# Patient Record
Sex: Male | Born: 1960 | Race: White | Hispanic: No | Marital: Single | Smoking: Never smoker
Health system: Southern US, Academic
[De-identification: ages and names within clinical notes are randomized; demographics above are authoritative.]

## PROBLEM LIST (undated history)

## (undated) DIAGNOSIS — I10 Essential (primary) hypertension: Secondary | ICD-10-CM

## (undated) DIAGNOSIS — F419 Anxiety disorder, unspecified: Secondary | ICD-10-CM

## (undated) DIAGNOSIS — R000 Tachycardia, unspecified: Secondary | ICD-10-CM

## (undated) DIAGNOSIS — R011 Cardiac murmur, unspecified: Secondary | ICD-10-CM

## (undated) DIAGNOSIS — C801 Malignant (primary) neoplasm, unspecified: Secondary | ICD-10-CM

## (undated) DIAGNOSIS — K219 Gastro-esophageal reflux disease without esophagitis: Secondary | ICD-10-CM

## (undated) DIAGNOSIS — R009 Unspecified abnormalities of heart beat: Secondary | ICD-10-CM

## (undated) DIAGNOSIS — F32A Depression, unspecified: Secondary | ICD-10-CM

## (undated) HISTORY — DX: Depression, unspecified: F32.A

## (undated) HISTORY — DX: Anxiety disorder, unspecified: F41.9

## (undated) HISTORY — PX: HX HERNIA REPAIR: SHX51

## (undated) HISTORY — PX: HX GALL BLADDER SURGERY/CHOLE: SHX55

## (undated) HISTORY — PX: HX TONSILLECTOMY: SHX27

## (undated) HISTORY — PX: COLONOSCOPY: SHX174

## (undated) HISTORY — PX: ESOPHAGOGASTRODUODENOSCOPY: SHX1529

## (undated) HISTORY — PX: VASECTOMY: SHX75

## (undated) HISTORY — DX: Gastro-esophageal reflux disease without esophagitis: K21.9

## (undated) HISTORY — DX: Essential (primary) hypertension: I10

## (undated) HISTORY — PX: INGUINAL HERNIA REPAIR: SUR1180

---

## 2017-04-29 ENCOUNTER — Emergency Department (HOSPITAL_BASED_OUTPATIENT_CLINIC_OR_DEPARTMENT_OTHER): Payer: Managed Care, Other (non HMO)

## 2017-04-29 ENCOUNTER — Observation Stay (HOSPITAL_COMMUNITY): Payer: Managed Care, Other (non HMO)

## 2017-04-29 ENCOUNTER — Inpatient Hospital Stay (HOSPITAL_BASED_OUTPATIENT_CLINIC_OR_DEPARTMENT_OTHER)
Admission: EM | Admit: 2017-04-29 | Discharge: 2017-05-01 | DRG: 872 | Disposition: A | Discharge status: Home / Self Care | Payer: Managed Care, Other (non HMO) | Attending: Internal Medicine | Admitting: Internal Medicine

## 2017-04-29 ENCOUNTER — Encounter (HOSPITAL_BASED_OUTPATIENT_CLINIC_OR_DEPARTMENT_OTHER): Payer: Self-pay | Admitting: Emergency Medicine

## 2017-04-29 DIAGNOSIS — R739 Hyperglycemia, unspecified: Secondary | ICD-10-CM | POA: Diagnosis present

## 2017-04-29 DIAGNOSIS — A419 Sepsis, unspecified organism: Secondary | ICD-10-CM | POA: Diagnosis present

## 2017-04-29 DIAGNOSIS — Z8249 Family history of ischemic heart disease and other diseases of the circulatory system: Secondary | ICD-10-CM

## 2017-04-29 DIAGNOSIS — K219 Gastro-esophageal reflux disease without esophagitis: Secondary | ICD-10-CM | POA: Diagnosis present

## 2017-04-29 DIAGNOSIS — R079 Chest pain, unspecified: Secondary | ICD-10-CM | POA: Diagnosis present

## 2017-04-29 DIAGNOSIS — R000 Tachycardia, unspecified: Secondary | ICD-10-CM | POA: Diagnosis not present

## 2017-04-29 DIAGNOSIS — R935 Abnormal findings on diagnostic imaging of other abdominal regions, including retroperitoneum: Secondary | ICD-10-CM | POA: Diagnosis not present

## 2017-04-29 DIAGNOSIS — R509 Fever, unspecified: Secondary | ICD-10-CM

## 2017-04-29 DIAGNOSIS — R748 Abnormal levels of other serum enzymes: Secondary | ICD-10-CM | POA: Diagnosis not present

## 2017-04-29 DIAGNOSIS — K810 Acute cholecystitis: Secondary | ICD-10-CM | POA: Diagnosis present

## 2017-04-29 DIAGNOSIS — I361 Nonrheumatic tricuspid (valve) insufficiency: Secondary | ICD-10-CM | POA: Diagnosis not present

## 2017-04-29 DIAGNOSIS — E876 Hypokalemia: Secondary | ICD-10-CM | POA: Diagnosis present

## 2017-04-29 DIAGNOSIS — R1013 Epigastric pain: Secondary | ICD-10-CM | POA: Diagnosis not present

## 2017-04-29 DIAGNOSIS — R945 Abnormal results of liver function studies: Secondary | ICD-10-CM | POA: Diagnosis not present

## 2017-04-29 DIAGNOSIS — F419 Anxiety disorder, unspecified: Secondary | ICD-10-CM | POA: Diagnosis present

## 2017-04-29 DIAGNOSIS — R7989 Other specified abnormal findings of blood chemistry: Secondary | ICD-10-CM

## 2017-04-29 HISTORY — DX: Anxiety disorder, unspecified: F41.9

## 2017-04-29 LAB — BASIC METABOLIC PANEL
ANION GAP: 6 (ref 5–15)
BUN: 13 mg/dL (ref 6–20)
CALCIUM: 9 mg/dL (ref 8.9–10.3)
CO2: 26 mmol/L (ref 22–32)
Chloride: 105 mmol/L (ref 101–111)
Creatinine, Ser: 0.95 mg/dL (ref 0.61–1.24)
Glucose, Bld: 129 mg/dL — ABNORMAL HIGH (ref 65–99)
Potassium: 3.6 mmol/L (ref 3.5–5.1)
Sodium: 137 mmol/L (ref 135–145)

## 2017-04-29 LAB — CBC
HCT: 41.3 % (ref 39.0–52.0)
HEMOGLOBIN: 13.7 g/dL (ref 13.0–17.0)
MCH: 29 pg (ref 26.0–34.0)
MCHC: 33.2 g/dL (ref 30.0–36.0)
MCV: 87.3 fL (ref 78.0–100.0)
Platelets: 224 10*3/uL (ref 150–400)
RBC: 4.73 MIL/uL (ref 4.22–5.81)
RDW: 12.5 % (ref 11.5–15.5)
WBC: 8 10*3/uL (ref 4.0–10.5)

## 2017-04-29 LAB — TROPONIN I

## 2017-04-29 LAB — D-DIMER, QUANTITATIVE (NOT AT ARMC): D DIMER QUANT: 0.51 ug{FEU}/mL — AB (ref 0.00–0.50)

## 2017-04-29 LAB — LIPASE, BLOOD: LIPASE: 58 U/L — AB (ref 11–51)

## 2017-04-29 MED ORDER — ACETAMINOPHEN 325 MG PO TABS
650.0000 mg | ORAL_TABLET | Freq: Four times a day (QID) | ORAL | Status: DC | PRN
  Administered 2017-04-30 (×2): 650 mg via ORAL
  Filled 2017-04-29 (×2): qty 2

## 2017-04-29 MED ORDER — ACETAMINOPHEN 500 MG PO TABS
1000.0000 mg | ORAL_TABLET | Freq: Once | ORAL | Status: AC
  Administered 2017-04-29: 1000 mg via ORAL
  Filled 2017-04-29: qty 2

## 2017-04-29 MED ORDER — ONDANSETRON HCL 4 MG/2ML IJ SOLN
INTRAMUSCULAR | Status: AC
  Filled 2017-04-29: qty 2

## 2017-04-29 MED ORDER — ONDANSETRON HCL 4 MG/2ML IJ SOLN
4.0000 mg | Freq: Once | INTRAMUSCULAR | Status: AC
  Administered 2017-04-29: 4 mg via INTRAVENOUS

## 2017-04-29 MED ORDER — MORPHINE SULFATE (PF) 4 MG/ML IV SOLN
4.0000 mg | Freq: Once | INTRAVENOUS | Status: AC
  Administered 2017-04-29: 4 mg via INTRAVENOUS
  Filled 2017-04-29: qty 1

## 2017-04-29 MED ORDER — NITROGLYCERIN 0.4 MG SL SUBL
SUBLINGUAL_TABLET | SUBLINGUAL | Status: AC
  Filled 2017-04-29: qty 1

## 2017-04-29 MED ORDER — ASPIRIN 81 MG PO CHEW
324.0000 mg | CHEWABLE_TABLET | Freq: Once | ORAL | Status: AC
  Administered 2017-04-29: 324 mg via ORAL
  Filled 2017-04-29: qty 4

## 2017-04-29 MED ORDER — IOPAMIDOL (ISOVUE-370) INJECTION 76%
INTRAVENOUS | Status: AC
  Administered 2017-04-29: 100 mL
  Filled 2017-04-29: qty 100

## 2017-04-29 MED ORDER — ENOXAPARIN SODIUM 40 MG/0.4ML ~~LOC~~ SOLN
40.0000 mg | SUBCUTANEOUS | Status: DC
  Filled 2017-04-29: qty 0.4

## 2017-04-29 MED ORDER — NITROGLYCERIN IN D5W 200-5 MCG/ML-% IV SOLN
0.0000 ug/min | INTRAVENOUS | Status: DC
  Filled 2017-04-29: qty 250

## 2017-04-29 MED ORDER — MORPHINE SULFATE (PF) 4 MG/ML IV SOLN
4.0000 mg | Freq: Once | INTRAVENOUS | Status: DC
  Filled 2017-04-29: qty 1

## 2017-04-29 MED ORDER — NITROGLYCERIN 0.4 MG SL SUBL
0.4000 mg | SUBLINGUAL_TABLET | SUBLINGUAL | Status: DC | PRN
  Administered 2017-04-29 (×2): 0.4 mg via SUBLINGUAL

## 2017-04-29 MED ORDER — ACETAMINOPHEN 650 MG RE SUPP: 650.0000 mg | Freq: Four times a day (QID) | RECTAL | Status: DC | PRN

## 2017-04-29 MED ORDER — GI COCKTAIL ~~LOC~~
30.0000 mL | Freq: Once | ORAL | Status: AC
  Administered 2017-04-29: 30 mL via ORAL
  Filled 2017-04-29: qty 30

## 2017-04-29 MED ORDER — SODIUM CHLORIDE 0.9 % IV SOLN
INTRAVENOUS | Status: AC
  Administered 2017-04-30: via INTRAVENOUS

## 2017-04-29 NOTE — ED Provider Notes (Signed)
[facility] Provider Note   CSN: [id number] Arrival date & time: 04/29/17  1335     History   Chief Complaint Chief Complaint  Patient presents with  . Chest Pain    HPI Douglas Hester is a 56 y.o. male who presents emergency Department with chief complaint of epigastric abdominal and retrosternal chest pain. He has a history of chronic reflux was for which she takes pantoprazole. Patient states that he has been under significant stress and recently moved into an apartment here in [facility] because of the end of his relationship. He was out to eat with his brother when he started having pain in his epigastrium he states that the pain became more and more intense. He states he felt like he was being hit in the chest with a sledgehammer the pain radiates directly back in between his shoulder blades. He became nauseous but did not vomit he denies diaphoresis or shortness of breath .He Took his pantoprazole, half a bottle of Pepto-Bismol and 4 Tums with no relief in his pain. He called his insurance health triage nurse line who directed him to the emergency department. Upon arrival he had 10 out of 10 severe epigastric and chest pain. Patient was given aspirin, 3 sublingual nitroglycerin, morphine and a GI cocktail with complete resolution of his pain.  HPI  Past Medical History:  Diagnosis Date  . Anxiety     There are no active problems to display for this patient.   Past Surgical History:  Procedure Laterality Date  . HERNIA REPAIR         Home Medications    Prior to Admission medications   Medication Sig Start Date End Date Taking? Authorizing Provider  buPROPion (WELLBUTRIN) 100 MG tablet Take 100 mg by mouth 2 (two) times daily.   Yes [provider]    Family History History reviewed. No pertinent family history.  Social History Social History  Substance Use Topics  . Smoking status: Never Smoker  . Smokeless tobacco: Never Used  . Alcohol  use No     Allergies   Patient has no known allergies.   Review of Systems Review of Systems  Ten systems reviewed and are negative for acute change, except as noted in the HPI.   Physical Exam Updated Vital Signs BP 114/70   Pulse 88   Temp 98.3 F (36.8 C) (Oral)   Resp 19   Ht  (1.753 m)   Wt 94.3 kg (208 lb)   SpO2 98%   BMI 30.72 kg/m   Physical Exam  Constitutional: He is oriented to person, place, and time. He appears well-developed and well-nourished. No distress.  HENT:  Head: Normocephalic and atraumatic.  Eyes: Pupils are equal, round, and reactive to light. Conjunctivae and EOM are normal. No scleral icterus.  Neck: Normal range of motion. Neck supple.  Cardiovascular: Normal rate, regular rhythm and normal heart sounds.   Pulmonary/Chest: Effort normal and breath sounds normal. No respiratory distress. He has no wheezes.  Abdominal: Soft. He exhibits no distension. There is tenderness (epigastrium). There is no guarding.  Musculoskeletal: He exhibits no edema.  Neurological: He is alert and oriented to person, place, and time.  Skin: Skin is warm and dry. He is not diaphoretic.  Psychiatric: His behavior is normal.  Nursing note and vitals reviewed.    ED Treatments / Results  Labs (all labs ordered are listed, but only abnormal results are displayed) Labs Reviewed  BASIC METABOLIC PANEL -  Abnormal; Notable for the following:       Result Value   Glucose, Bld 129 (*)    All other components within normal limits  LIPASE, BLOOD - Abnormal; Notable for the following:    Lipase 58 (*)    All other components within normal limits  CBC  TROPONIN I  TROPONIN I  TROPONIN I    EKG  EKG Interpretation None       Radiology Dg Chest Portable 1 View  Result Date: 04/29/2017 CLINICAL DATA:  Chest pain. EXAM: PORTABLE CHEST 1 VIEW COMPARISON:  None. FINDINGS: The heart size and mediastinal contours are within normal limits. Both lungs are  clear. No pneumothorax or pleural effusion is noted. The visualized skeletal structures are unremarkable. IMPRESSION: No acute cardiopulmonary abnormality seen. Electronically Signed   By: Lupita Raider, M.D.   On: 04/29/2017 15:42    Procedures Procedures (including critical care time)  Medications Ordered in ED Medications  nitroGLYCERIN (NITROSTAT) SL tablet 0.4 mg (0.4 mg Sublingual Given 04/29/17 1503)  nitroGLYCERIN (NITROSTAT) 0.4 MG SL tablet (not administered)  ondansetron (ZOFRAN) injection 4 mg (4 mg Intravenous Given 04/29/17 1413)  aspirin chewable tablet 324 mg (324 mg Oral Given 04/29/17 1448)  gi cocktail (Maalox,Lidocaine,Donnatal) (30 mLs Oral Given 04/29/17 1448)  morphine 4 MG/ML injection 4 mg (4 mg Intravenous Given 04/29/17 1448)     Initial Impression / Assessment and Plan / ED Course  I have reviewed the triage vital signs and the nursing notes.  Pertinent labs & imaging results that were available during my care of the patient were reviewed by me and considered in my medical decision making (see chart for details).     Bedside ultrasound performed by Dr. Donnald Garre without evidence of dissection, or AAA. Patient is improved with treatment. EKG with right lumbar branch block, no evidence of ischemic abnormalities at this time. Given onset of symptoms earlier today feel he will need rule out for chest pain. Initial troponin is normal.   Patient with minimal risk factors for acute coronary syndrome however onset of symptoms and story are clinically significant for ACS. Patient may also have peptic ulcer disease as the cause of his pain. He is pain-free at this time. It has been accepted for observation admission at [facility]. He is stable for transfer. Final Clinical Impressions(s) / ED Diagnoses   Final diagnoses:  None    New Prescriptions New Prescriptions   No medications on file     Arthor Captain, PA-C 04/29/17 1643    Arby Barrette,  MD 05/04/17 0028

## 2017-04-29 NOTE — ED Notes (Signed)
Dr Hardie Lora at bs with abd [place]

## 2017-04-29 NOTE — ED Notes (Signed)
Epigastric pain that radiates to back about after eating a egg mcmuffin and hash browns

## 2017-04-29 NOTE — H&P (Signed)
[facility] H&P   Patient Demographics:    Douglas Hester, is a 56 y.o. male  MRN: [id number]   DOB - Jan 24, 1961  Admit Date - 04/29/2017  Outpatient Primary MD for the patient is Alben Spittle, Ellin Mayhew., MD [facility])  Referring MD/NP/PA:   Arthor Captain  Outpatient Specialists:   Patient coming from: home  Chief Complaint  Patient presents with  . Chest Pain      HPI:    Douglas Hester  is a 56 y.o. male, w Anxiety c/o epigastric, substernal chest pain after breakfast.  With radiation to the back.  Micah Flesher to [facility] and took pepto and antacid and omeprazole and didn't improve and then called [employer] nurse line and went to ED.  In ED, CXR negative. Pt had beside ultrasound=> no dissection .    In ED, given GI cocktail.  Pt does't think the nitroglycerin helped.  Morphine helped.  Pt is currently pain free.      Review of systems:    In addition to the HPI above,   No Headache, No changes with Vision or hearing, No problems swallowing food or Liquids, No Cough or Shortness of Breath, No Abdominal pain, No Nausea or Vommitting, Bowel movements are regular, No Blood in stool or Urine, No dysuria, No new skin rashes or bruises, No new joints pains-aches,  No new weakness, tingling, numbness in any extremity, No recent weight gain or loss, No polyuria, polydypsia or polyphagia, No significant Mental Stressors.  A full 10 point Review of Systems was done, except as stated above, all other Review of Systems were negative.   With Past History of the following :    Past Medical History:  Diagnosis Date  . Anxiety       Past Surgical History:  Procedure Laterality Date  . HERNIA REPAIR        Social History:     Social History  Substance Use Topics  . Smoking status: Never Smoker  . Smokeless tobacco: Never Used  . Alcohol use No     Lives - at  home  Mobility - walks by self   Family History :     Family History  Problem Relation Age of Onset  . Lung cancer Mother   . Lymphoma Father   . Valvular heart disease Sister        Home Medications:   Prior to Admission medications   Medication Sig Start Date End Date Taking? Authorizing Provider  buPROPion (WELLBUTRIN) 100 MG tablet Take 100 mg by mouth 2 (two) times daily.   Yes [provider]     Allergies:    No Known Allergies   Physical Exam:   Vitals  Blood pressure 138/78, pulse (!) 117, temperature (!) 101.6 F (38.7 C), temperature source Oral, resp. rate (!) 24, height  (1.753 m), weight 91.5 kg (201  lb 11.2 oz), SpO2 100 %.   1. General  lying in bed in NAD,   2. Normal affect and insight, Not Suicidal or Homicidal, Awake Alert, Oriented X 3.  3. No F.N deficits, ALL C.Nerves Intact, Strength 5/5 all 4 extremities, Sensation intact all 4 extremities, Plantars down going.  4. Ears and Eyes appear Normal, Conjunctivae clear, PERRLA. Moist Oral Mucosa.  5. Supple Neck, No JVD, No cervical lymphadenopathy appriciated, No Carotid Bruits.  6. Symmetrical Chest wall movement, Good air movement bilaterally, CTAB.  7. Tachy s1, s2, No Gallops, Rubs or Murmurs, No Parasternal Heave.  8. Positive Bowel Sounds, Abdomen Soft, No tenderness, No organomegaly appriciated,No rebound -guarding or rigidity.  9.  No Cyanosis, Normal Skin Turgor, No Skin Rash or Bruise.  10. Good muscle tone,  joints appear normal , no effusions, Normal ROM.  11. No Palpable Lymph Nodes in Neck or Axillae  No splinter, no janeway, no osler   Data Review:    CBC  Recent Labs Lab 04/29/17 1411  WBC 8.0  HGB 13.7  HCT 41.3  PLT 224  MCV 87.3  MCH 29.0  MCHC 33.2  RDW 12.5   ------------------------------------------------------------------------------------------------------------------  Chemistries   Recent Labs Lab 04/29/17 1411  NA 137  K 3.6   CL 105  CO2 26  GLUCOSE 129*  BUN 13  CREATININE 0.95  CALCIUM 9.0   ------------------------------------------------------------------------------------------------------------------ estimated creatinine clearance is 98.2 mL/min (by C-G formula based on SCr of 0.95 mg/dL). ------------------------------------------------------------------------------------------------------------------ No results for input(s): TSH, T4TOTAL, T3FREE, THYROIDAB in the last 72 hours.  Invalid input(s): FREET3  Coagulation profile No results for input(s): INR, PROTIME in the last 168 hours. ------------------------------------------------------------------------------------------------------------------- No results for input(s): DDIMER in the last 72 hours. -------------------------------------------------------------------------------------------------------------------  Cardiac Enzymes  Recent Labs Lab 04/29/17 1411 04/29/17 1750  TROPONINI <0.03 <0.03   ------------------------------------------------------------------------------------------------------------------ No results found for: BNP   ---------------------------------------------------------------------------------------------------------------  Urinalysis No results found for: COLORURINE, APPEARANCEUR, LABSPEC, PHURINE, GLUCOSEU, HGBUR, BILIRUBINUR, KETONESUR, PROTEINUR, UROBILINOGEN, NITRITE, LEUKOCYTESUR  ----------------------------------------------------------------------------------------------------------------   Imaging Results:    Dg Chest Portable 1 View  Result Date: 04/29/2017 CLINICAL DATA:  Chest pain. EXAM: PORTABLE CHEST 1 VIEW COMPARISON:  None. FINDINGS: The heart size and mediastinal contours are within normal limits. Both lungs are clear. No pneumothorax or pleural effusion is noted. The visualized skeletal structures are unremarkable. IMPRESSION: No acute cardiopulmonary abnormality seen. Electronically  Signed   By: Lupita Raider, M.D.   On: 04/29/2017 15:42    nsr at 95, lad, RBBB and LAFB   Assessment & Plan:    Active Problems:   Chest pain   Fever, unspecified   Tachycardia   Hyperglycemia    Cp Tele tropi q6h x3 D dimer If positive the CTA chest r/o PE Cardiac echo Cardiology consulted by email   Fever Unclear source. Blood culture x2,  ua , urine culture Consider further w/up  Tachycardia Check tsh Check cardiac echo  Hyerglycemia fsbs q4h, ISS    DVT Prophylaxis  Lovenox - SCDs  AM Labs Ordered, also please review Full Orders  Family Communication: Admission, patients condition and plan of care including tests being ordered have been discussed with the patient  who indicate understanding and agree with the plan and Code Status.  Code Status FULL CODE  Likely DC to  home  Condition GUARDED    Consults called: cardioloogy by email  Admission status: inpatient  Time spent in minutes : 45   Pearson Grippe M.D on 04/29/2017  at 10:29 PM  Between 7am to 7pm - Pager - [phone]. After 7pm go to [email or web address] - password [facility]  [facility] - Office  [phone]

## 2017-04-29 NOTE — ED Notes (Signed)
Delay in xray, RN starting IV 

## 2017-04-29 NOTE — ED Notes (Signed)
Pt on cardiac monitor and auto VS 

## 2017-04-29 NOTE — ED Notes (Signed)
ED Provider at bedside. 

## 2017-04-29 NOTE — ED Notes (Signed)
Dr Hardie Lora ordered to hold third nitro.

## 2017-04-29 NOTE — ED Triage Notes (Signed)
Patient states that he started to have some mid epigastric pain this am. Reports that it now radiates up into his back

## 2017-04-30 ENCOUNTER — Inpatient Hospital Stay (HOSPITAL_COMMUNITY): Payer: Managed Care, Other (non HMO)

## 2017-04-30 DIAGNOSIS — R1013 Epigastric pain: Secondary | ICD-10-CM

## 2017-04-30 DIAGNOSIS — I361 Nonrheumatic tricuspid (valve) insufficiency: Secondary | ICD-10-CM

## 2017-04-30 LAB — LACTIC ACID, PLASMA
LACTIC ACID, VENOUS: 0.9 mmol/L (ref 0.5–1.9)
Lactic Acid, Venous: 0.7 mmol/L (ref 0.5–1.9)

## 2017-04-30 LAB — COMPREHENSIVE METABOLIC PANEL
ALT: 184 U/L — ABNORMAL HIGH (ref 17–63)
AST: 220 U/L — AB (ref 15–41)
Albumin: 3.6 g/dL (ref 3.5–5.0)
Alkaline Phosphatase: 85 U/L (ref 38–126)
Anion gap: 6 (ref 5–15)
BUN: 8 mg/dL (ref 6–20)
CHLORIDE: 105 mmol/L (ref 101–111)
CO2: 26 mmol/L (ref 22–32)
Calcium: 8.7 mg/dL — ABNORMAL LOW (ref 8.9–10.3)
Creatinine, Ser: 0.92 mg/dL (ref 0.61–1.24)
Glucose, Bld: 119 mg/dL — ABNORMAL HIGH (ref 65–99)
Potassium: 3.6 mmol/L (ref 3.5–5.1)
Sodium: 137 mmol/L (ref 135–145)
Total Bilirubin: 2.9 mg/dL — ABNORMAL HIGH (ref 0.3–1.2)
Total Protein: 6.4 g/dL — ABNORMAL LOW (ref 6.5–8.1)

## 2017-04-30 LAB — ECHOCARDIOGRAM COMPLETE
AOASC: 32 cm
EWDT: 194 ms
FS: 31 % (ref 28–44)
Height: 69 in
IV/PV OW: 0.83
LA ID, A-P, ES: 36 mm
LA diam end sys: 36 mm
LA vol A4C: 39.2 ml
LA vol index: 21.3 mL/m2
LA vol: 45.5 mL
LADIAMINDEX: 1.69 cm/m2
LV PW d: 12 mm — AB (ref 0.6–1.1)
LV TDI E'LATERAL: 8.7
LV TDI E'MEDIAL: 9.68
LVELAT: 8.7 cm/s
LVOT area: 3.46 cm2
LVOTD: 21 mm
Lateral S' vel: 13.2 cm/s
MV Dec: 194
MV pk E vel: 1 m/s
RV TAPSE: 21.4 mm
Weight: 3227.2 oz

## 2017-04-30 LAB — CBC
HEMATOCRIT: 39.6 % (ref 39.0–52.0)
HEMOGLOBIN: 13.1 g/dL (ref 13.0–17.0)
MCH: 28.9 pg (ref 26.0–34.0)
MCHC: 33.1 g/dL (ref 30.0–36.0)
MCV: 87.4 fL (ref 78.0–100.0)
Platelets: 187 10*3/uL (ref 150–400)
RBC: 4.53 MIL/uL (ref 4.22–5.81)
RDW: 12.6 % (ref 11.5–15.5)
WBC: 10.8 10*3/uL — AB (ref 4.0–10.5)

## 2017-04-30 LAB — HIV ANTIBODY (ROUTINE TESTING W REFLEX): HIV SCREEN 4TH GENERATION: NONREACTIVE

## 2017-04-30 LAB — TROPONIN I: Troponin I: <0.03 ng/mL (ref <0.03)

## 2017-04-30 LAB — HEMOGLOBIN A1C
Hgb A1c MFr Bld: 5.2 % (ref 4.8–5.6)
Mean Plasma Glucose: 102.54 mg/dL

## 2017-04-30 LAB — SEDIMENTATION RATE: Sed Rate: 2 mm/hr (ref 0–16)

## 2017-04-30 LAB — TSH: TSH: 0.592 u[IU]/mL (ref 0.350–4.500)

## 2017-04-30 MED ORDER — GADOBENATE DIMEGLUMINE 529 MG/ML IV SOLN
20.0000 mL | Freq: Once | INTRAVENOUS | Status: AC | PRN
  Administered 2017-04-30: 20 mL via INTRAVENOUS

## 2017-04-30 MED ORDER — VANCOMYCIN HCL IN DEXTROSE 1-5 GM/200ML-% IV SOLN
1000.0000 mg | Freq: Three times a day (TID) | INTRAVENOUS | Status: DC
  Administered 2017-04-30: 1000 mg via INTRAVENOUS
  Filled 2017-04-30 (×2): qty 200

## 2017-04-30 MED ORDER — PIPERACILLIN-TAZOBACTAM 3.375 G IVPB
3.3750 g | Freq: Three times a day (TID) | INTRAVENOUS | Status: DC
  Administered 2017-04-30 – 2017-05-01 (×4): 3.375 g via INTRAVENOUS
  Filled 2017-04-30 (×6): qty 50

## 2017-04-30 NOTE — Progress Notes (Signed)
Pharmacy Antibiotic Note  Douglas Hester is a 56 y.o. male admitted on 04/29/2017 with fever.  Pharmacy has been consulted for Vancomycin/Zosyn dosing. WBC WNL. Renal function good. Febrile up to 101.6.   Plan: Vancomycin 1000 mg IV q8h Zosyn 3.375G IV q8h to be infused over 4 hours Trend WBC, temp, renal function  F/U infectious work-up Drug levels as indicated   Height:  (175.3 cm) Weight: 201 lb 11.2 oz (91.5 kg) IBW/kg (Calculated) : 70.7  Temp (24hrs), Avg:100 F (37.8 C), Min:98.3 F (36.8 C), Max:101.6 F (38.7 C)   Recent Labs Lab 04/29/17 1411  WBC 8.0  CREATININE 0.95    Estimated Creatinine Clearance: 98.2 mL/min (by C-G formula based on SCr of 0.95 mg/dL).    No Known Allergies  Abran Duke 04/30/2017 12:52 AM

## 2017-04-30 NOTE — Progress Notes (Signed)
  Echocardiogram 2D Echocardiogram has been performed.  Douglas Hester 04/30/2017, 11:08 AM

## 2017-04-30 NOTE — Consult Note (Signed)
CONSULTATION NOTE   Patient Name: Douglas Hester Date of Encounter: 04/30/2017 Cardiologist: None (NEW)  Chief Complaint   Chest pain  Hospital Problem List   Active Problems:   Chest pain   Fever, unspecified   Tachycardia   Hyperglycemia    HPI   Douglas Hester is a 56 y.o. male who is being seen today for the evaluation of chest pain at the request of Dr. Rockne Menghini. Douglas Hester was admitted overnight for chest pain, fever, tachycardia and hyperglycemia. According to his account, he describes epigastric abdominal discomfort and retrosternal chest pain. He has a history of GERD. He felt like the pain radiated to his back on his shoulder blades. He reported no relief with a home GI cocktail. He also is given aspirin, 3 sublingual nitroglycerin, morphine and a GI cocktail around the same time. Eventually his pain did resolve.EKG shows sinus rhythm with left anterior fascicular block and right bundle branch block at 95. No acute ischemic changes.troponins yesterday and today were all negative.he is noted to havean elevated AST and ALT at 220/184. Total bilirubin is elevated at 2.9 and lipase is elevated at 58. D-dimer was performed a mildly abnormal 0.51 however CT pulmonary angiography demonstrated no evidence for pulmonary embolism or aortic dissection.  PMHx   Past Medical History:  Diagnosis Date  . Anxiety     Past Surgical History:  Procedure Laterality Date  . HERNIA REPAIR      FAMHx   Family History  Problem Relation Age of Onset  . Lung cancer Mother   . Lymphoma Father   . Valvular heart disease Sister     SOCHx    reports that he has never smoked. He has never used smokeless tobacco. He reports that he does not drink alcohol or use drugs.  Outpatient Medications   No current facility-administered medications on file prior to encounter.    No current outpatient prescriptions on file prior to encounter.    Inpatient Medications    Scheduled Meds: .  enoxaparin (LOVENOX) injection  40 mg Subcutaneous Q24H  .  morphine injection  4 mg Intravenous Once    Continuous Infusions: . sodium chloride 75 mL/hr at 04/30/17 0000  . piperacillin-tazobactam (ZOSYN)  IV 3.375 g (04/30/17 0147)  . vancomycin 1,000 mg (04/30/17 0147)    PRN Meds: acetaminophen **OR** acetaminophen, nitroGLYCERIN   ALLERGIES   No Known Allergies  ROS   Pertinent items noted in HPI and remainder of comprehensive ROS otherwise negative.  Vitals   Vitals:   04/29/17 1945 04/29/17 2000 04/29/17 2208 04/30/17 0713  BP: 130/80 136/82 138/78 119/72  Pulse: (!) 118 (!) 108 (!) 117 (!) 104  Resp: 20 (!) 27 (!) 24   Temp:   (!) 101.6 F (38.7 C) 100 F (37.8 C)  TempSrc:   Oral Oral  SpO2: 98% 97% 100% 95%  Weight:   201 lb 11.2 oz (91.5 kg)   Height:   _0  (1.753 m)    No intake or output data in the 24 hours ending 04/30/17 0824 Filed Weights   04/29/17 1339 04/29/17 2208  Weight: 208 lb (94.3 kg) 201 lb 11.2 oz (91.5 kg)    Physical Exam   General appearance: alert and no distress Neck: no carotid bruit, no JVD and thyroid not enlarged, symmetric, no tenderness/mass/nodules Lungs: clear to auscultation bilaterally Heart: regular rate and rhythm, S1, S2 normal, no murmur, click, rub or gallop Abdomen: mild TTP over the midepigastrium Extremities: extremities normal,  atraumatic, no cyanosis or edema Pulses: 2+ and symmetric Skin: Skin color, texture, turgor normal. No rashes or lesions Neurologic: Grossly normal Psych: Pleasant  Labs   Results for orders placed or performed during the hospital encounter of 04/29/17 (from the past 48 hour(s))  Basic metabolic panel     Status: Abnormal   Collection Time: 04/29/17  2:11 PM  Result Value Ref Range   Sodium 137 135 - 145 mmol/L   Potassium 3.6 3.5 - 5.1 mmol/L   Chloride 105 101 - 111 mmol/L   CO2 26 22 - 32 mmol/L   Glucose, Bld 129 (H) 65 - 99 mg/dL   BUN 13 6 - 20 mg/dL   Creatinine, Ser  0.95 0.61 - 1.24 mg/dL   Calcium 9.0 8.9 - 10.3 mg/dL   GFR calc non Af Amer >60 >60 mL/min   GFR calc Af Amer >60 >60 mL/min    Comment: (NOTE) The eGFR has been calculated using the CKD EPI equation. This calculation has not been validated in all clinical situations. eGFR's persistently <60 mL/min signify possible Chronic Kidney Disease.    Anion gap 6 5 - 15  CBC     Status: None   Collection Time: 04/29/17  2:11 PM  Result Value Ref Range   WBC 8.0 4.0 - 10.5 K/uL   RBC 4.73 4.22 - 5.81 MIL/uL   Hemoglobin 13.7 13.0 - 17.0 g/dL   HCT 41.3 39.0 - 52.0 %   MCV 87.3 78.0 - 100.0 fL   MCH 29.0 26.0 - 34.0 pg   MCHC 33.2 30.0 - 36.0 g/dL   RDW 12.5 11.5 - 15.5 %   Platelets 224 150 - 400 K/uL  Troponin I     Status: None   Collection Time: 04/29/17  2:11 PM  Result Value Ref Range   Troponin I <0.03 <0.03 ng/mL  Lipase, blood     Status: Abnormal   Collection Time: 04/29/17  2:11 PM  Result Value Ref Range   Lipase 58 (H) 11 - 51 U/L  Troponin I  (0, 3)     Status: None   Collection Time: 04/29/17  5:50 PM  Result Value Ref Range   Troponin I <0.03 <0.03 ng/mL  D-dimer, quantitative (not at [facility])     Status: Abnormal   Collection Time: 04/29/17 10:43 PM  Result Value Ref Range   D-Dimer, Quant 0.51 (H) 0.00 - 0.50 ug/mL-FEU    Comment: (NOTE) At the manufacturer cut-off of 0.50 ug/mL FEU, this assay has been documented to exclude PE with a sensitivity and negative predictive value of 97 to 99%.  At this time, this assay has not been approved by the FDA to exclude DVT/VTE. Results should be correlated with clinical presentation.   Sedimentation rate     Status: None   Collection Time: 04/29/17 10:43 PM  Result Value Ref Range   Sed Rate 2 0 - 16 mm/hr  Comprehensive metabolic panel     Status: Abnormal   Collection Time: 04/30/17 12:35 AM  Result Value Ref Range   Sodium 137 135 - 145 mmol/L   Potassium 3.6 3.5 - 5.1 mmol/L   Chloride 105 101 - 111 mmol/L   CO2  26 22 - 32 mmol/L   Glucose, Bld 119 (H) 65 - 99 mg/dL   BUN 8 6 - 20 mg/dL   Creatinine, Ser 0.92 0.61 - 1.24 mg/dL   Calcium 8.7 (L) 8.9 - 10.3 mg/dL   Total Protein 6.4 (L)  6.5 - 8.1 g/dL   Albumin 3.6 3.5 - 5.0 g/dL   AST 220 (H) 15 - 41 U/L   ALT 184 (H) 17 - 63 U/L   Alkaline Phosphatase 85 38 - 126 U/L   Total Bilirubin 2.9 (H) 0.3 - 1.2 mg/dL   GFR calc non Af Amer >60 >60 mL/min   GFR calc Af Amer >60 >60 mL/min    Comment: (NOTE) The eGFR has been calculated using the CKD EPI equation. This calculation has not been validated in all clinical situations. eGFR's persistently <60 mL/min signify possible Chronic Kidney Disease.    Anion gap 6 5 - 15  CBC     Status: Abnormal   Collection Time: 04/30/17 12:35 AM  Result Value Ref Range   WBC 10.8 (H) 4.0 - 10.5 K/uL   RBC 4.53 4.22 - 5.81 MIL/uL   Hemoglobin 13.1 13.0 - 17.0 g/dL   HCT 39.6 39.0 - 52.0 %   MCV 87.4 78.0 - 100.0 fL   MCH 28.9 26.0 - 34.0 pg   MCHC 33.1 30.0 - 36.0 g/dL   RDW 12.6 11.5 - 15.5 %   Platelets 187 150 - 400 K/uL  TSH     Status: None   Collection Time: 04/30/17 12:35 AM  Result Value Ref Range   TSH 0.592 0.350 - 4.500 uIU/mL    Comment: Performed by a 3rd Generation assay with a functional sensitivity of <=0.01 uIU/mL.  Lactic acid, plasma     Status: None   Collection Time: 04/30/17 12:36 AM  Result Value Ref Range   Lactic Acid, Venous 0.9 0.5 - 1.9 mmol/L  Lactic acid, plasma     Status: None   Collection Time: 04/30/17  3:37 AM  Result Value Ref Range   Lactic Acid, Venous 0.7 0.5 - 1.9 mmol/L  Troponin I (q 6hr x 3)     Status: None   Collection Time: 04/30/17  3:37 AM  Result Value Ref Range   Troponin I <0.03 <0.03 ng/mL    ECG   NSR with RBBB and LAFB - Personally Reviewed  Telemetry   NSR with bifasicular block in 80's - Personally Reviewed  Radiology   Ct Angio Chest Pe W Or Wo Contrast  Result Date: 04/30/2017 CLINICAL DATA:  Epigastric abdominal pain. History  of hernia repair in chronic reflux. EXAM: CT ANGIOGRAPHY CHEST CT ABDOMEN AND PELVIS WITH CONTRAST TECHNIQUE: Multidetector CT imaging of the chest was performed using the standard protocol during bolus administration of intravenous contrast. Multiplanar CT image reconstructions and MIPs were obtained to evaluate the vascular anatomy. Multidetector CT imaging of the abdomen and pelvis was performed using the standard protocol during bolus administration of intravenous contrast. CONTRAST:  100 mL Isovue 370 COMPARISON:  None. FINDINGS: CTA CHEST FINDINGS Cardiovascular: Satisfactory opacification of the pulmonary arteries to the segmental level. No evidence of pulmonary embolism. Normal heart size. No pericardial effusion. Normal caliber thoracic aorta. No aneurysm or dissection. Great vessel origins are patent. Mediastinum/Nodes: No enlarged mediastinal, hilar, or axillary lymph nodes. Thyroid gland, trachea, and esophagus demonstrate no significant findings. Lungs/Pleura: Mild dependent changes in the lung bases. No airspace disease or consolidation. No pleural effusions. No pneumothorax. Airways are patent. Musculoskeletal: Degenerative changes in the spine. No destructive bone lesions. Review of the MIP images confirms the above findings. CT ABDOMEN and PELVIS FINDINGS Hepatobiliary: Mild diffuse fatty infiltration of the liver. No focal lesions. Gallbladder and bile ducts are unremarkable. Pancreas: Unremarkable. No pancreatic ductal dilatation  or surrounding inflammatory changes. Spleen: Normal in size without focal abnormality. Adrenals/Urinary Tract: No adrenal gland nodules. Kidneys are symmetrical with homogeneous nephrograms. No hydronephrosis or hydroureter. No mass lesions identified. Bladder wall is not thickened and no filling defects are identified. Stomach/Bowel: Stomach is within normal limits. Appendix appears normal. No evidence of bowel wall thickening, distention, or inflammatory changes.  Diverticulum of the third portion of the duodenum. Vascular/Lymphatic: No significant vascular findings are present. No enlarged abdominal or pelvic lymph nodes. Reproductive: Prostate is unremarkable. Other: No abdominal wall hernia or abnormality. No abdominopelvic ascites. Musculoskeletal: No acute or significant osseous findings. Review of the MIP images confirms the above findings. IMPRESSION: 1. No evidence of significant pulmonary embolus. 2. No evidence of active pulmonary disease. 3. Mild diffuse fatty infiltration of the liver. 4. Duodenal diverticulum. 5. No evidence of bowel obstruction or inflammation. Electronically Signed   By: Lucienne Capers M.D.   On: 04/30/2017 00:23   Ct Abdomen Pelvis W Contrast  Result Date: 04/30/2017 CLINICAL DATA:  Epigastric abdominal pain. History of hernia repair in chronic reflux. EXAM: CT ANGIOGRAPHY CHEST CT ABDOMEN AND PELVIS WITH CONTRAST TECHNIQUE: Multidetector CT imaging of the chest was performed using the standard protocol during bolus administration of intravenous contrast. Multiplanar CT image reconstructions and MIPs were obtained to evaluate the vascular anatomy. Multidetector CT imaging of the abdomen and pelvis was performed using the standard protocol during bolus administration of intravenous contrast. CONTRAST:  100 mL Isovue 370 COMPARISON:  None. FINDINGS: CTA CHEST FINDINGS Cardiovascular: Satisfactory opacification of the pulmonary arteries to the segmental level. No evidence of pulmonary embolism. Normal heart size. No pericardial effusion. Normal caliber thoracic aorta. No aneurysm or dissection. Great vessel origins are patent. Mediastinum/Nodes: No enlarged mediastinal, hilar, or axillary lymph nodes. Thyroid gland, trachea, and esophagus demonstrate no significant findings. Lungs/Pleura: Mild dependent changes in the lung bases. No airspace disease or consolidation. No pleural effusions. No pneumothorax. Airways are patent.  Musculoskeletal: Degenerative changes in the spine. No destructive bone lesions. Review of the MIP images confirms the above findings. CT ABDOMEN and PELVIS FINDINGS Hepatobiliary: Mild diffuse fatty infiltration of the liver. No focal lesions. Gallbladder and bile ducts are unremarkable. Pancreas: Unremarkable. No pancreatic ductal dilatation or surrounding inflammatory changes. Spleen: Normal in size without focal abnormality. Adrenals/Urinary Tract: No adrenal gland nodules. Kidneys are symmetrical with homogeneous nephrograms. No hydronephrosis or hydroureter. No mass lesions identified. Bladder wall is not thickened and no filling defects are identified. Stomach/Bowel: Stomach is within normal limits. Appendix appears normal. No evidence of bowel wall thickening, distention, or inflammatory changes. Diverticulum of the third portion of the duodenum. Vascular/Lymphatic: No significant vascular findings are present. No enlarged abdominal or pelvic lymph nodes. Reproductive: Prostate is unremarkable. Other: No abdominal wall hernia or abnormality. No abdominopelvic ascites. Musculoskeletal: No acute or significant osseous findings. Review of the MIP images confirms the above findings. IMPRESSION: 1. No evidence of significant pulmonary embolus. 2. No evidence of active pulmonary disease. 3. Mild diffuse fatty infiltration of the liver. 4. Duodenal diverticulum. 5. No evidence of bowel obstruction or inflammation. Electronically Signed   By: Lucienne Capers M.D.   On: 04/30/2017 00:23   Dg Chest Portable 1 View  Result Date: 04/29/2017 CLINICAL DATA:  Chest pain. EXAM: PORTABLE CHEST 1 VIEW COMPARISON:  None. FINDINGS: The heart size and mediastinal contours are within normal limits. Both lungs are clear. No pneumothorax or pleural effusion is noted. The visualized skeletal structures are unremarkable. IMPRESSION: No acute cardiopulmonary  abnormality seen. Electronically Signed   By: Marijo Conception, M.D.    On: 04/29/2017 15:42    Cardiac Studies   N/A  Impression   1. Active Problems: 2.   Chest pain 3.   Fever, unspecified 4.   Tachycardia 5.   Hyperglycemia 6.   Recommendation   1. Symptoms are atypical for cardiac chest pain. Troponin negative x 3. Liver enzymes are elevated and lipase is as well. Has had low grade fevers. CT abdomen, however, unremarkable. Regardless, suspect a GI etiology - ?acute hepatitis, acalculous cholecystitis, also consider fasting lipid profile to evaluate for hypertriglyceridemia. Family history of CAD in Mom who had a stent and sister who is s/p mitral valve surgery. Could follow-up with me after discharge and consider stress testing.  Thanks for the consult. Will sign-off for now, call with questions.  Time Spent Directly with Patient:  I have spent a total of 45 minutes with the patient reviewing hospital notes, telemetry, EKGs, labs and examining the patient as well as establishing an assessment and plan that was discussed personally with the patient. > 50% of time was spent in direct patient care.  Length of Stay:  LOS: 1 day   Pixie Casino, MD, [place]  Attending Cardiologist  Direct Dial: [phone]  Fax: [phone]  Website: [email or web address] Vazquez 04/30/2017, 8:24 AM

## 2017-04-30 NOTE — Progress Notes (Signed)
Progress Note    Douglas Hester  GNF:[id number] DOB: 1961/02/03  DOA: 04/29/2017 PCP: Elijio Miles., MD    Brief Narrative:   Chief complaint: Follow-up chest pain  Medical records reviewed and are as summarized below:  Douglas Hester is an 56 y.o. male with a PMH of depression who was admitted 04/29/17 for evaluation of substernal chest pain radiating to the back unrelieved by antacids. Initial chest x-ray negative. Troponin negative 3.  Assessment/Plan:   Principal Problem:   Chest pain Chest x-ray negative. No evidence of dissection. Troponin negative 3. D-dimer mildly elevated. Evaluated by cardiologist who recommended outpatient F/U for consideration of a stress test.  Active Problems:   R/O Sepsis, Fever, unspecified/elevated LFTs Temperature up to 101.79F. With elevated LFTs, need to rule out acute cholecystitis. Chest pain may be referred from abdominal process. Placed on empiric vancomycin and Zosyn for sepsis, we'll discontinue vancomycin as MRSA not likely and given elevated LFTs, intra-abdominal source more likely. Lactic acid is not elevated, which is reassuring. Check MRCP. Check FLP/hepatitis serologies.    Tachycardia Likely from sepsis. Treat underlying condition and monitor.    Hyperglycemia Check hemoglobin A1c.   Family Communication/Anticipated D/C date and plan/Code Status   DVT prophylaxis: Lovenox ordered. Code Status: Full Code.  Family Communication: No family at the bedside. Disposition Plan: Home if MRCP negative.   Medical Consultants:    Cardiology.   Anti-Infectives:    None  Subjective:   No current abdominal or chest pain.  Has a pounding headache. Occasional alcohol. No nausea/vomiting today, but had nausea yesterday. No BM in 2 days.  Objective:    Vitals:   04/29/17 1945 04/29/17 2000 04/29/17 2208 04/30/17 0713  BP: 130/80 136/82 138/78 119/72  Pulse: (!) 118 (!) 108 (!) 117 (!) 104  Resp: 20 (!) 27 (!) 24     Temp:   (!) 101.6 F (38.7 C) 100 F (37.8 C)  TempSrc:   Oral Oral  SpO2: 98% 97% 100% 95%  Weight:   91.5 kg (201 lb 11.2 oz)   Height:    (1.753 m)    No intake or output data in the 24 hours ending 04/30/17 0905 Filed Weights   04/29/17 1339 04/29/17 2208  Weight: 94.3 kg (208 lb) 91.5 kg (201 lb 11.2 oz)    Exam: General: No acute distress. Cardiovascular: Heart sounds show a regular rate, and rhythm. No gallops or rubs. No murmurs. No JVD. Lungs: Clear to auscultation bilaterally with good air movement. No rales, rhonchi or wheezes. Abdomen: Soft, nontender, nondistended with normal active bowel sounds. No masses. No hepatosplenomegaly. Neurological: Alert and oriented 3. Moves all extremities 4 with equal strength. Cranial nerves II through XII grossly intact. Skin: Warm and dry. No rashes or lesions. Extremities: No clubbing or cyanosis. No edema. Pedal pulses 2+. Psychiatric: Mood and affect are normal. Insight and judgment are good.   Data Reviewed:   I have personally reviewed following labs and imaging studies:  Labs: Labs reviewed 04/30/17 and show the following:   Basic Metabolic Panel:  Recent Labs Lab 04/29/17 1411 04/30/17 0035  NA 137 137  K 3.6 3.6  CL 105 105  CO2 26 26  GLUCOSE 129* 119*  BUN 13 8  CREATININE 0.95 0.92  CALCIUM 9.0 8.7*   GFR Estimated Creatinine Clearance: 101.4 mL/min (by C-G formula based on SCr of 0.92 mg/dL). Liver Function Tests:  Recent Labs Lab 04/30/17 0035  AST 220*  ALT  184*  ALKPHOS 85  BILITOT 2.9*  PROT 6.4*  ALBUMIN 3.6    Recent Labs Lab 04/29/17 1411  LIPASE 58*   CBC:  Recent Labs Lab 04/29/17 1411 04/30/17 0035  WBC 8.0 10.8*  HGB 13.7 13.1  HCT 41.3 39.6  MCV 87.3 87.4  PLT 224 187   Cardiac Enzymes:  Recent Labs Lab 04/29/17 1411 04/29/17 1750 04/30/17 0337  TROPONINI <0.03 <0.03 <0.03   D-Dimer:  Recent Labs  04/29/17 2243  DDIMER 0.51*   Thyroid function  studies:  Recent Labs  04/30/17 0035  TSH 0.592   Sepsis Labs:  Recent Labs Lab 04/29/17 1411 04/30/17 0035 04/30/17 0036 04/30/17 0337  WBC 8.0 10.8*  --   --   LATICACIDVEN  --   --  0.9 0.7    Microbiology No results found for this or any previous visit (from the past 240 hour(s)).  Procedures and diagnostic studies:  04/30/17: Ct Angio Chest Pe W Or Wo Contrast: No PE.   04/30/17: Ct Abdomen Pelvis W Contrast: Fatty liver. Gallbladder/ducts unremarkable.  04/30/17: Dg Chest Portable 1 View: My independent review of the image shows: Normal chest.   04/30/17: RUQ [place]: Mildly thickened gallbladder wall, no gallstones. Negative Murphy sign.  Medications:   . enoxaparin (LOVENOX) injection  40 mg Subcutaneous Q24H  .  morphine injection  4 mg Intravenous Once   Continuous Infusions: . sodium chloride 75 mL/hr at 04/30/17 0000  . piperacillin-tazobactam (ZOSYN)  IV 3.375 g (04/30/17 0147)  . vancomycin 1,000 mg (04/30/17 0147)     LOS: 1 day   RAMA,CHRISTINA  [facility] Pager [phone]. If unable to reach me by pager, please call my cell phone at [phone].  *Please refer to [email or web address], password [email or web address] to get updated schedule on who will round on this patient, as hospitalists switch teams weekly. If 7PM-7AM, please contact night-coverage at [email or web address], password [email or web address] for any overnight needs.  04/30/2017, 9:05 AM

## 2017-05-01 ENCOUNTER — Encounter (HOSPITAL_COMMUNITY): Payer: Self-pay | Admitting: Physician Assistant

## 2017-05-01 DIAGNOSIS — E876 Hypokalemia: Secondary | ICD-10-CM | POA: Diagnosis present

## 2017-05-01 DIAGNOSIS — R079 Chest pain, unspecified: Secondary | ICD-10-CM

## 2017-05-01 DIAGNOSIS — R7989 Other specified abnormal findings of blood chemistry: Secondary | ICD-10-CM | POA: Diagnosis present

## 2017-05-01 DIAGNOSIS — R748 Abnormal levels of other serum enzymes: Secondary | ICD-10-CM

## 2017-05-01 DIAGNOSIS — R945 Abnormal results of liver function studies: Secondary | ICD-10-CM

## 2017-05-01 DIAGNOSIS — R935 Abnormal findings on diagnostic imaging of other abdominal regions, including retroperitoneum: Secondary | ICD-10-CM

## 2017-05-01 LAB — HEPATITIS PANEL, ACUTE
HEP B C IGM: NEGATIVE
HEP B S AG: NEGATIVE
Hep A IgM: NEGATIVE

## 2017-05-01 LAB — LIPID PANEL
Cholesterol: 137 mg/dL (ref 0–200)
HDL: 48 mg/dL (ref >40)
LDL CALC: 75 mg/dL (ref 0–99)
Total CHOL/HDL Ratio: 2.9 RATIO
Triglycerides: 72 mg/dL (ref <150)
VLDL: 14 mg/dL (ref 0–40)

## 2017-05-01 LAB — COMPREHENSIVE METABOLIC PANEL
ALT: 214 U/L — AB (ref 17–63)
AST: 122 U/L — AB (ref 15–41)
Albumin: 3.5 g/dL (ref 3.5–5.0)
Alkaline Phosphatase: 109 U/L (ref 38–126)
Anion gap: 6 (ref 5–15)
BUN: 5 mg/dL — ABNORMAL LOW (ref 6–20)
CHLORIDE: 107 mmol/L (ref 101–111)
CO2: 25 mmol/L (ref 22–32)
CREATININE: 0.94 mg/dL (ref 0.61–1.24)
Calcium: 9 mg/dL (ref 8.9–10.3)
GFR calc non Af Amer: >60 mL/min (ref >60)
Glucose, Bld: 108 mg/dL — ABNORMAL HIGH (ref 65–99)
Potassium: 3.4 mmol/L — ABNORMAL LOW (ref 3.5–5.1)
SODIUM: 138 mmol/L (ref 135–145)
Total Bilirubin: 3.8 mg/dL — ABNORMAL HIGH (ref 0.3–1.2)
Total Protein: 6.5 g/dL (ref 6.5–8.1)

## 2017-05-01 MED ORDER — METRONIDAZOLE 500 MG PO TABS: 500.0000 mg | ORAL_TABLET | Freq: Three times a day (TID) | ORAL | 0 refills | Status: DC

## 2017-05-01 MED ORDER — CIPROFLOXACIN HCL 500 MG PO TABS: 500.0000 mg | ORAL_TABLET | Freq: Two times a day (BID) | ORAL | 0 refills | Status: AC

## 2017-05-01 MED ORDER — CIPROFLOXACIN HCL 500 MG PO TABS: 500.0000 mg | ORAL_TABLET | Freq: Two times a day (BID) | ORAL | 0 refills | Status: DC

## 2017-05-01 MED ORDER — DIPHENHYDRAMINE HCL 50 MG/ML IJ SOLN
25.0000 mg | Freq: Once | INTRAMUSCULAR | Status: AC
  Administered 2017-05-01: 25 mg via INTRAVENOUS
  Filled 2017-05-01: qty 1

## 2017-05-01 MED ORDER — POTASSIUM CHLORIDE CRYS ER 20 MEQ PO TBCR
40.0000 meq | EXTENDED_RELEASE_TABLET | Freq: Once | ORAL | Status: AC
  Administered 2017-05-01: 40 meq via ORAL

## 2017-05-01 MED ORDER — METRONIDAZOLE 500 MG PO TABS: 500.0000 mg | ORAL_TABLET | Freq: Three times a day (TID) | ORAL | 0 refills | Status: AC

## 2017-05-01 NOTE — Progress Notes (Signed)
Pts Zosyn completed at this time. Pt stated that he has a itchy throat after the last Zosyn dose and his hands and feet itched.  Pt states now that he feels nauseous and his hands and feet itch.  He believes it is from the Zosyn.  Pt given Benadryl. Pt requested a Gingerale for his stomach. Physician notified.

## 2017-05-01 NOTE — Progress Notes (Signed)
Discharge instructions (including medications) discussed with and copy provided to patient/caregiver 

## 2017-05-01 NOTE — Progress Notes (Signed)
Progress note given to patient for work. Sent by MD.  Hillery Hunter RN

## 2017-05-01 NOTE — Consult Note (Signed)
[facility] Surgery Consult Note  Douglas Hester August 29, 1960  [id number].    Requesting MD: Jacquelynn Cree Chief Complaint/Reason for Consult: Elevated LFTs  HPI:  Douglas Hester is a 56yo male PMH GERD who presented to [facility] 9/30 with worsening epigastric pain after eating breakfast at [facility]. States that the pain gradually got worse during the day and eventually radiated into his back. It was associated with nausea. Takes daily protonix for reflux but states that this pain was different. He tried Tums and Pepto Bismal but this did not help therefore he went to the ED. Cardiac workup was negative. Hew was found to have a temp 101.6, elevated LFTs (AST 220, ALT 184, bilirubin 2.9), slightly elevated lipase 58. Hepatitis panel negative. CT abdomen/pelvis negative, RUQ u/s showed mild gallbladder wall thickening but no gallbladder distension and no gallstones, and MRCP with no acute findings. Started on empiric vancomycin and zosyn. In the ED he also received morphine, nitroglycerin, aspirin, and GI cocktail. Patient states that after 1 dose of morphine his pain resolved and has not returned. He denies any current n/v. He is tolerating a regular diet.   PMH significant for GERD, PUD with last upper endoscopy in the 1990's Abdominal surgical history: Left inguinal hernia repair Anticoagulants: none Nonsmoker Drinks 1 beer every other weekend Takes aspirin occasionally for headaches Employment: truck driver  ROS: Review of Systems  Constitutional: Positive for fever.  HENT: Negative.   Eyes: Negative.   Respiratory: Negative.   Cardiovascular: Negative.   Gastrointestinal: Positive for abdominal pain, constipation, heartburn and nausea. Negative for blood in stool, diarrhea, melena and vomiting.  Genitourinary: Negative.   Musculoskeletal: Negative.   Skin: Negative.   Neurological: Negative.   All systems reviewed and otherwise negative except for as  above  Family History  Problem Relation Age of Onset  . Lung cancer Mother   . Lymphoma Father   . Valvular heart disease Sister     Past Medical History:  Diagnosis Date  . Anxiety     Past Surgical History:  Procedure Laterality Date  . COLONOSCOPY  ~ 2014   done by GI in [facility]. Polyps were removed. Patient not aware what type of polyps these were  . ESOPHAGOGASTRODUODENOSCOPY  early 1990s   MD found ulcer  . INGUINAL HERNIA REPAIR Left ~ 2010   with Mesh.   Marland Kitchen VASECTOMY      Social History:  reports that he has never smoked. He has never used smokeless tobacco. He reports that he does not drink alcohol or use drugs.  Allergies: No Known Allergies  Medications Prior to Admission  Medication Sig Dispense Refill  . buPROPion (WELLBUTRIN XL) 300 MG 24 hr tablet Take 300 mg by mouth daily.    . cycloSPORINE (RESTASIS) 0.05 % ophthalmic emulsion Place 2 drops into both eyes 2 (two) times daily.    Marland Kitchen lamoTRIgine (LAMICTAL) 100 MG tablet Take 100 mg by mouth daily.    . pantoprazole (PROTONIX) 40 MG tablet Take 40 mg by mouth daily.      Prior to Admission medications   Medication Sig Start Date End Date Taking? Authorizing Provider  buPROPion (WELLBUTRIN XL) 300 MG 24 hr tablet Take 300 mg by mouth daily. 02/19/17  Yes [provider]  cycloSPORINE (RESTASIS) 0.05 % ophthalmic emulsion Place 2 drops into both eyes 2 (two) times daily. 10/13/15  Yes [provider]  lamoTRIgine (LAMICTAL) 100 MG tablet Take 100 mg by mouth daily. 02/19/17 02/19/18 Yes  [provider]  pantoprazole (PROTONIX) 40 MG tablet Take 40 mg by mouth daily. 02/19/17 02/19/18 Yes [provider]    Blood pressure 106/69, pulse 91, temperature 98.4 F (36.9 C), temperature source Oral, resp. rate 16, height 5' 9"  (1.753 m), weight 201 lb 11.2 oz (91.5 kg), SpO2 98 %. Physical Exam: General: pleasant, WD/WN white male who is laying in bed in NAD HEENT: head is  normocephalic, atraumatic.  Sclera are noninjected.  Pupils equal and round.  Ears and nose without any masses or lesions.  Mouth is pink and moist. Dentition fair Heart: regular, rate, and rhythm.  No obvious murmurs, gallops, or rubs noted.  Palpable pedal pulses bilaterally Lungs: CTAB, no wheezes, rhonchi, or rales noted.  Respiratory effort nonlabored Abd: soft, NT/ND, +BS, no masses, hernias, or organomegaly MS: all 4 extremities are symmetrical with no cyanosis, clubbing, or edema. Skin: warm and dry with no masses, lesions, or rashes Psych: A&Ox3 with an appropriate affect. Neuro: cranial nerves grossly intact, extremity CSM intact bilaterally, normal speech  Results for orders placed or performed during the hospital encounter of 04/29/17 (from the past 48 hour(s))  Basic metabolic panel     Status: Abnormal   Collection Time: 04/29/17  2:11 PM  Result Value Ref Range   Sodium 137 135 - 145 mmol/L   Potassium 3.6 3.5 - 5.1 mmol/L   Chloride 105 101 - 111 mmol/L   CO2 26 22 - 32 mmol/L   Glucose, Bld 129 (H) 65 - 99 mg/dL   BUN 13 6 - 20 mg/dL   Creatinine, Ser 0.95 0.61 - 1.24 mg/dL   Calcium 9.0 8.9 - 10.3 mg/dL   GFR calc non Af Amer >60 >60 mL/min   GFR calc Af Amer >60 >60 mL/min    Comment: (NOTE) The eGFR has been calculated using the CKD EPI equation. This calculation has not been validated in all clinical situations. eGFR's persistently <60 mL/min signify possible Chronic Kidney Disease.    Anion gap 6 5 - 15  CBC     Status: None   Collection Time: 04/29/17  2:11 PM  Result Value Ref Range   WBC 8.0 4.0 - 10.5 K/uL   RBC 4.73 4.22 - 5.81 MIL/uL   Hemoglobin 13.7 13.0 - 17.0 g/dL   HCT 41.3 39.0 - 52.0 %   MCV 87.3 78.0 - 100.0 fL   MCH 29.0 26.0 - 34.0 pg   MCHC 33.2 30.0 - 36.0 g/dL   RDW 12.5 11.5 - 15.5 %   Platelets 224 150 - 400 K/uL  Troponin I     Status: None   Collection Time: 04/29/17  2:11 PM  Result Value Ref Range   Troponin I <0.03 <0.03  ng/mL  Lipase, blood     Status: Abnormal   Collection Time: 04/29/17  2:11 PM  Result Value Ref Range   Lipase 58 (H) 11 - 51 U/L  Troponin I  (0, 3)     Status: None   Collection Time: 04/29/17  5:50 PM  Result Value Ref Range   Troponin I <0.03 <0.03 ng/mL  D-dimer, quantitative (not at [facility])     Status: Abnormal   Collection Time: 04/29/17 10:43 PM  Result Value Ref Range   D-Dimer, Quant 0.51 (H) 0.00 - 0.50 ug/mL-FEU    Comment: (NOTE) At the manufacturer cut-off of 0.50 ug/mL FEU, this assay has been documented to exclude PE with a sensitivity and negative predictive value of 97 to  99%.  At this time, this assay has not been approved by the FDA to exclude DVT/VTE. Results should be correlated with clinical presentation.   HIV antibody (Routine Testing)     Status: None   Collection Time: 04/29/17 10:43 PM  Result Value Ref Range   HIV Screen 4th Generation wRfx Non Reactive Non Reactive    Comment: (NOTE) Performed At: [facility] [place], [place] [id number] Lindon Romp MD [id number]   Sedimentation rate     Status: None   Collection Time: 04/29/17 10:43 PM  Result Value Ref Range   Sed Rate 2 0 - 16 mm/hr  Comprehensive metabolic panel     Status: Abnormal   Collection Time: 04/30/17 12:35 AM  Result Value Ref Range   Sodium 137 135 - 145 mmol/L   Potassium 3.6 3.5 - 5.1 mmol/L   Chloride 105 101 - 111 mmol/L   CO2 26 22 - 32 mmol/L   Glucose, Bld 119 (H) 65 - 99 mg/dL   BUN 8 6 - 20 mg/dL   Creatinine, Ser 0.92 0.61 - 1.24 mg/dL   Calcium 8.7 (L) 8.9 - 10.3 mg/dL   Total Protein 6.4 (L) 6.5 - 8.1 g/dL   Albumin 3.6 3.5 - 5.0 g/dL   AST 220 (H) 15 - 41 U/L   ALT 184 (H) 17 - 63 U/L   Alkaline Phosphatase 85 38 - 126 U/L   Total Bilirubin 2.9 (H) 0.3 - 1.2 mg/dL   GFR calc non Af Amer >60 >60 mL/min   GFR calc Af Amer >60 >60 mL/min    Comment: (NOTE) The eGFR has been calculated using the CKD EPI equation. This calculation  has not been validated in all clinical situations. eGFR's persistently <60 mL/min signify possible Chronic Kidney Disease.    Anion gap 6 5 - 15  CBC     Status: Abnormal   Collection Time: 04/30/17 12:35 AM  Result Value Ref Range   WBC 10.8 (H) 4.0 - 10.5 K/uL   RBC 4.53 4.22 - 5.81 MIL/uL   Hemoglobin 13.1 13.0 - 17.0 g/dL   HCT 39.6 39.0 - 52.0 %   MCV 87.4 78.0 - 100.0 fL   MCH 28.9 26.0 - 34.0 pg   MCHC 33.1 30.0 - 36.0 g/dL   RDW 12.6 11.5 - 15.5 %   Platelets 187 150 - 400 K/uL  TSH     Status: None   Collection Time: 04/30/17 12:35 AM  Result Value Ref Range   TSH 0.592 0.350 - 4.500 uIU/mL    Comment: Performed by a 3rd Generation assay with a functional sensitivity of <=0.01 uIU/mL.  Lactic acid, plasma     Status: None   Collection Time: 04/30/17 12:36 AM  Result Value Ref Range   Lactic Acid, Venous 0.9 0.5 - 1.9 mmol/L  Lactic acid, plasma     Status: None   Collection Time: 04/30/17  3:37 AM  Result Value Ref Range   Lactic Acid, Venous 0.7 0.5 - 1.9 mmol/L  Troponin I (q 6hr x 3)     Status: None   Collection Time: 04/30/17  3:37 AM  Result Value Ref Range   Troponin I <0.03 <0.03 ng/mL  Troponin I (q 6hr x 3)     Status: None   Collection Time: 04/30/17  8:45 AM  Result Value Ref Range   Troponin I <0.03 <0.03 ng/mL  Hemoglobin A1c     Status: None   Collection  Time: 04/30/17  9:24 AM  Result Value Ref Range   Hgb A1c MFr Bld 5.2 4.8 - 5.6 %    Comment: (NOTE) Pre diabetes:          5.7%-6.4% Diabetes:              >6.4% Glycemic control for   <7.0% adults with diabetes    Mean Plasma Glucose 102.54 mg/dL  Troponin I (q 6hr x 3)     Status: None   Collection Time: 04/30/17  3:38 PM  Result Value Ref Range   Troponin I <0.03 <0.03 ng/mL  Hepatitis panel, acute     Status: None   Collection Time: 04/30/17  3:38 PM  Result Value Ref Range   Hepatitis B Surface Ag Negative Negative   HCV Ab <0.1 0.0 - 0.9 s/co ratio    Comment: (NOTE)                                   Negative:     < 0.8                             Indeterminate: 0.8 - 0.9                                  Positive:     > 0.9 The CDC recommends that a positive HCV antibody result be followed up with a HCV Nucleic Acid Amplification test (388828). Performed At: [facility] [place], [place] [id number] Lindon Romp MD [id number]    Hep A IgM Negative Negative   Hep B C IgM Negative Negative  Lipid panel     Status: None   Collection Time: 05/01/17  4:26 AM  Result Value Ref Range   Cholesterol 137 0 - 200 mg/dL   Triglycerides 72 <150 mg/dL   HDL 48 >40 mg/dL   Total CHOL/HDL Ratio 2.9 RATIO   VLDL 14 0 - 40 mg/dL   LDL Cholesterol 75 0 - 99 mg/dL    Comment:        Total Cholesterol/HDL:CHD Risk Coronary Heart Disease Risk Table                     Men   Women  1/2 Average Risk   3.4   3.3  Average Risk       5.0   4.4  2 X Average Risk   9.6   7.1  3 X Average Risk  23.4   11.0        Use the calculated Patient Ratio above and the CHD Risk Table to determine the patient's CHD Risk.        ATP III CLASSIFICATION (LDL):  <100     mg/dL   Optimal  100-129  mg/dL   Near or Above                    Optimal  130-159  mg/dL   Borderline  160-189  mg/dL   High  >190     mg/dL   Very High   Comprehensive metabolic panel     Status: Abnormal   Collection Time: 05/01/17  4:26 AM  Result Value Ref Range   Sodium 138 135 - 145 mmol/L  Potassium 3.4 (L) 3.5 - 5.1 mmol/L   Chloride 107 101 - 111 mmol/L   CO2 25 22 - 32 mmol/L   Glucose, Bld 108 (H) 65 - 99 mg/dL   BUN 5 (L) 6 - 20 mg/dL   Creatinine, Ser 0.94 0.61 - 1.24 mg/dL   Calcium 9.0 8.9 - 10.3 mg/dL   Total Protein 6.5 6.5 - 8.1 g/dL   Albumin 3.5 3.5 - 5.0 g/dL   AST 122 (H) 15 - 41 U/L   ALT 214 (H) 17 - 63 U/L   Alkaline Phosphatase 109 38 - 126 U/L   Total Bilirubin 3.8 (H) 0.3 - 1.2 mg/dL   GFR calc non Af Amer >60 >60 mL/min   GFR calc Af Amer >60 >60  mL/min    Comment: (NOTE) The eGFR has been calculated using the CKD EPI equation. This calculation has not been validated in all clinical situations. eGFR's persistently <60 mL/min signify possible Chronic Kidney Disease.    Anion gap 6 5 - 15   Ct Angio Chest Pe W Or Wo Contrast  Result Date: 04/30/2017 CLINICAL DATA:  Epigastric abdominal pain. History of hernia repair in chronic reflux. EXAM: CT ANGIOGRAPHY CHEST CT ABDOMEN AND PELVIS WITH CONTRAST TECHNIQUE: Multidetector CT imaging of the chest was performed using the standard protocol during bolus administration of intravenous contrast. Multiplanar CT image reconstructions and MIPs were obtained to evaluate the vascular anatomy. Multidetector CT imaging of the abdomen and pelvis was performed using the standard protocol during bolus administration of intravenous contrast. CONTRAST:  100 mL Isovue 370 COMPARISON:  None. FINDINGS: CTA CHEST FINDINGS Cardiovascular: Satisfactory opacification of the pulmonary arteries to the segmental level. No evidence of pulmonary embolism. Normal heart size. No pericardial effusion. Normal caliber thoracic aorta. No aneurysm or dissection. Great vessel origins are patent. Mediastinum/Nodes: No enlarged mediastinal, hilar, or axillary lymph nodes. Thyroid gland, trachea, and esophagus demonstrate no significant findings. Lungs/Pleura: Mild dependent changes in the lung bases. No airspace disease or consolidation. No pleural effusions. No pneumothorax. Airways are patent. Musculoskeletal: Degenerative changes in the spine. No destructive bone lesions. Review of the MIP images confirms the above findings. CT ABDOMEN and PELVIS FINDINGS Hepatobiliary: Mild diffuse fatty infiltration of the liver. No focal lesions. Gallbladder and bile ducts are unremarkable. Pancreas: Unremarkable. No pancreatic ductal dilatation or surrounding inflammatory changes. Spleen: Normal in size without focal abnormality. Adrenals/Urinary  Tract: No adrenal gland nodules. Kidneys are symmetrical with homogeneous nephrograms. No hydronephrosis or hydroureter. No mass lesions identified. Bladder wall is not thickened and no filling defects are identified. Stomach/Bowel: Stomach is within normal limits. Appendix appears normal. No evidence of bowel wall thickening, distention, or inflammatory changes. Diverticulum of the third portion of the duodenum. Vascular/Lymphatic: No significant vascular findings are present. No enlarged abdominal or pelvic lymph nodes. Reproductive: Prostate is unremarkable. Other: No abdominal wall hernia or abnormality. No abdominopelvic ascites. Musculoskeletal: No acute or significant osseous findings. Review of the MIP images confirms the above findings. IMPRESSION: 1. No evidence of significant pulmonary embolus. 2. No evidence of active pulmonary disease. 3. Mild diffuse fatty infiltration of the liver. 4. Duodenal diverticulum. 5. No evidence of bowel obstruction or inflammation. Electronically Signed   By: Lucienne Capers M.D.   On: 04/30/2017 00:23   Ct Abdomen Pelvis W Contrast  Result Date: 04/30/2017 CLINICAL DATA:  Epigastric abdominal pain. History of hernia repair in chronic reflux. EXAM: CT ANGIOGRAPHY CHEST CT ABDOMEN AND PELVIS WITH CONTRAST TECHNIQUE: Multidetector CT imaging of  the chest was performed using the standard protocol during bolus administration of intravenous contrast. Multiplanar CT image reconstructions and MIPs were obtained to evaluate the vascular anatomy. Multidetector CT imaging of the abdomen and pelvis was performed using the standard protocol during bolus administration of intravenous contrast. CONTRAST:  100 mL Isovue 370 COMPARISON:  None. FINDINGS: CTA CHEST FINDINGS Cardiovascular: Satisfactory opacification of the pulmonary arteries to the segmental level. No evidence of pulmonary embolism. Normal heart size. No pericardial effusion. Normal caliber thoracic aorta. No aneurysm  or dissection. Great vessel origins are patent. Mediastinum/Nodes: No enlarged mediastinal, hilar, or axillary lymph nodes. Thyroid gland, trachea, and esophagus demonstrate no significant findings. Lungs/Pleura: Mild dependent changes in the lung bases. No airspace disease or consolidation. No pleural effusions. No pneumothorax. Airways are patent. Musculoskeletal: Degenerative changes in the spine. No destructive bone lesions. Review of the MIP images confirms the above findings. CT ABDOMEN and PELVIS FINDINGS Hepatobiliary: Mild diffuse fatty infiltration of the liver. No focal lesions. Gallbladder and bile ducts are unremarkable. Pancreas: Unremarkable. No pancreatic ductal dilatation or surrounding inflammatory changes. Spleen: Normal in size without focal abnormality. Adrenals/Urinary Tract: No adrenal gland nodules. Kidneys are symmetrical with homogeneous nephrograms. No hydronephrosis or hydroureter. No mass lesions identified. Bladder wall is not thickened and no filling defects are identified. Stomach/Bowel: Stomach is within normal limits. Appendix appears normal. No evidence of bowel wall thickening, distention, or inflammatory changes. Diverticulum of the third portion of the duodenum. Vascular/Lymphatic: No significant vascular findings are present. No enlarged abdominal or pelvic lymph nodes. Reproductive: Prostate is unremarkable. Other: No abdominal wall hernia or abnormality. No abdominopelvic ascites. Musculoskeletal: No acute or significant osseous findings. Review of the MIP images confirms the above findings. IMPRESSION: 1. No evidence of significant pulmonary embolus. 2. No evidence of active pulmonary disease. 3. Mild diffuse fatty infiltration of the liver. 4. Duodenal diverticulum. 5. No evidence of bowel obstruction or inflammation. Electronically Signed   By: Lucienne Capers M.D.   On: 04/30/2017 00:23   Mr 3d Recon At Scanner  Result Date: 05/01/2017 CLINICAL DATA:  Substernal  chest pain after breakfast. Epigastric pain. EXAM: MRI ABDOMEN WITHOUT AND WITH CONTRAST (INCLUDING MRCP) TECHNIQUE: Multiplanar multisequence MR imaging of the abdomen was performed both before and after the administration of intravenous contrast. Heavily T2-weighted images of the biliary and pancreatic ducts were obtained, and three-dimensional MRCP images were rendered by post processing. CONTRAST:  67m MULTIHANCE GADOBENATE DIMEGLUMINE 529 MG/ML IV SOLN COMPARISON:  CT abdomen pelvis 04/29/2017 FINDINGS: Lower chest:  Lung bases are clear. Hepatobiliary: No focal hepatic lesion. No biliary duct dilatation. Gallbladder is normal. No gallstones evident. The common bile duct normal caliber. Mild loss of signal intensity on the opposed phase imaging consistent mild hepatic steatosis. Postcontrast imaging demonstrates no enhancing hepatic lesion. Pancreas: Normal pancreatic parenchymal intensity. No ductal dilatation or inflammation. Spleen: Normal spleen. Adrenals/urinary tract: Adrenal glands and kidneys are normal. Stomach/Bowel: Stomach, duodenum and limited view of the bowel is unremarkable. Vascular/Lymphatic: Abdominal aortic normal caliber. No retroperitoneal periportal lymphadenopathy. Musculoskeletal: No aggressive osseous lesion IMPRESSION: 1. No explanation for abdominal pain. 2. Mild hepatic steatosis. 3. Normal biliary tree and gallbladder. 4. No pancreatic abnormality. Electronically Signed   By: SSuzy BouchardM.D.   On: 05/01/2017 07:39   Dg Chest Portable 1 View  Result Date: 04/29/2017 CLINICAL DATA:  Chest pain. EXAM: PORTABLE CHEST 1 VIEW COMPARISON:  None. FINDINGS: The heart size and mediastinal contours are within normal limits. Both lungs are clear. No  pneumothorax or pleural effusion is noted. The visualized skeletal structures are unremarkable. IMPRESSION: No acute cardiopulmonary abnormality seen. Electronically Signed   By: Marijo Conception, M.D.   On: 04/29/2017 15:42   Mr  Abdomen Mrcp Moise Boring Contast  Result Date: 05/01/2017 CLINICAL DATA:  Substernal chest pain after breakfast. Epigastric pain. EXAM: MRI ABDOMEN WITHOUT AND WITH CONTRAST (INCLUDING MRCP) TECHNIQUE: Multiplanar multisequence MR imaging of the abdomen was performed both before and after the administration of intravenous contrast. Heavily T2-weighted images of the biliary and pancreatic ducts were obtained, and three-dimensional MRCP images were rendered by post processing. CONTRAST:  38m MULTIHANCE GADOBENATE DIMEGLUMINE 529 MG/ML IV SOLN COMPARISON:  CT abdomen pelvis 04/29/2017 FINDINGS: Lower chest:  Lung bases are clear. Hepatobiliary: No focal hepatic lesion. No biliary duct dilatation. Gallbladder is normal. No gallstones evident. The common bile duct normal caliber. Mild loss of signal intensity on the opposed phase imaging consistent mild hepatic steatosis. Postcontrast imaging demonstrates no enhancing hepatic lesion. Pancreas: Normal pancreatic parenchymal intensity. No ductal dilatation or inflammation. Spleen: Normal spleen. Adrenals/urinary tract: Adrenal glands and kidneys are normal. Stomach/Bowel: Stomach, duodenum and limited view of the bowel is unremarkable. Vascular/Lymphatic: Abdominal aortic normal caliber. No retroperitoneal periportal lymphadenopathy. Musculoskeletal: No aggressive osseous lesion IMPRESSION: 1. No explanation for abdominal pain. 2. Mild hepatic steatosis. 3. Normal biliary tree and gallbladder. 4. No pancreatic abnormality. Electronically Signed   By: SSuzy BouchardM.D.   On: 05/01/2017 07:39   UKoreaAbdomen Limited Ruq  Result Date: 04/30/2017 CLINICAL DATA:  Elevated LFTs. EXAM: ULTRASOUND ABDOMEN LIMITED RIGHT UPPER QUADRANT COMPARISON:  Abdominal CT 04/29/2017 FINDINGS: Gallbladder: Gallbladder is decompressed but there is wall thickening measuring up to 0.5 cm. No gallstones or sludge. Patient does not have a sonographic Murphy sign. Common bile duct: Diameter: 0.4 cm  Liver: Increased echogenicity of the liver parenchyma. No intrahepatic biliary dilatation. No suspicious liver lesions. Portal vein is patent on color Doppler imaging with normal direction of blood flow towards the liver. IMPRESSION: Gallbladder wall is mildly thickened but no gallbladder distension and no gallstones. Findings are nonspecific with regards to gallbladder inflammation. Slightly increased echogenicity in the liver parenchyma. There may be a component of hepatic steatosis. Electronically Signed   By: AMarkus DaftM.D.   On: 04/30/2017 10:11      Assessment/Plan GERD H/o PUD  Epigastric pain Elevated LFTs - Patient with acute onset epigastric pain and elevated LFTs now asymptomatic and tolerating a diet. Difficult to say that his gallbladder is the cause of his symptoms with a negative MRCP and no gallstones. Could consider endoscopic ultrasound to further evaluate for possible stones not seen on other imaging studies. Would not recommend cholecystectomy at this time. Would also recommend considering upper endoscopy with patient's prior history of PUD and daily PPI use.  BWellington Hampshire PWomen [facility] BuffaloSurgery 05/01/2017, 12:47 PM Pager: [phone]: [phone]-Fri 7:00 am-4:30 pm Sat-Sun 7:00 am-11:30 am

## 2017-05-01 NOTE — Progress Notes (Signed)
Progress Note    [name]  WUJ:[id number] DOB: 01-14-1961  DOA: 04/29/2017 PCP: Elijio Miles., MD    Brief Narrative:   Chief complaint: Follow-up chest pain  Medical records reviewed and are as summarized below:  Douglas Hester is an 56 y.o. male with a PMH of depression who was admitted 04/29/17 for evaluation of substernal chest pain radiating to the back unrelieved by antacids. Initial chest x-ray negative. Troponin negative 3.  Assessment/Plan:   Principal Problem:   Chest pain Chest x-ray negative. No evidence of dissection. Troponin negative 3. D-dimer mildly elevated. Evaluated by cardiologist who recommended outpatient F/U for consideration of a stress test.  Active Problems:   R/O Sepsis, Fever, unspecified/elevated LFTs Still running low-grade fevers. Chest pain appears to be referred from abdominal process. Placed on empiric vancomycin and Zosyn for sepsis, currently only on Zosyn. Lactic acid is not elevated, which is reassuring. MRCP negative other than mild hepatic steatosis. Bilirubin continues to rise, AST improved but ALT worse. Acute hepatitis panel negative. We'll consult with GI. Hopefully he will just need outpatient F/U. Would like to go home today.    Tachycardia Likely from sepsis. Treat underlying condition and monitor.    Hyperglycemia Hemoglobin A1c 5.2%.    Hypokalemia We'll give 40 mEq of oral replacement today.   Family Communication/Anticipated D/C date and plan/Code Status   DVT prophylaxis: Lovenox ordered. Code Status: Full Code.  Family Communication: No family at the bedside. Disposition Plan: GI evaluation requested.   Medical Consultants:    Cardiology  Gastroenterology   Anti-Infectives:    Zosyn 04/30/17--->  Subjective:   No further pain, but still has a low grade fever. No headache. Feels well. No nausea, vomiting or diarrhea.  Objective:    Vitals:   04/30/17 0713 04/30/17 1500 04/30/17 2100  05/01/17 0554  BP: 119/72  125/78 106/69  Pulse: (!) 104  92 91  Resp:  Temp: 100 F (37.8 C) 98.3 F (36.8 C) 99.1 F (37.3 C) 98.4 F (36.9 C)  TempSrc: Oral Oral Oral Oral  SpO2: 95% 96% 99% 98%  Weight:      Height:        Intake/Output Summary (Last 24 hours) at 05/01/17 0811 Last data filed at 05/01/17 0228  Gross per 24 hour  Intake              992 ml  Output             2100 ml  Net            -1108 ml   Filed Weights   04/29/17 1339 04/29/17 2208  Weight: 94.3 kg (208 lb) 91.5 kg (201 lb 11.2 oz)    Exam: General: No acute distress. Cardiovascular: Heart sounds show a regular rate, and rhythm. No gallops or rubs. No murmurs. No JVD. Lungs: Clear to auscultation bilaterally with good air movement. No rales, rhonchi or wheezes. Abdomen: Soft, nontender, nondistended with normal active bowel sounds. No masses. No hepatosplenomegaly. Skin: Warm and dry. No rashes or lesions. Extremities: No clubbing or cyanosis. No edema. Pedal pulses 2+.   Data Reviewed:   I have personally reviewed following labs and imaging studies:  Labs: Labs reviewed 05/01/17 and show the following:   Basic Metabolic Panel:  Recent Labs Lab 04/29/17 1411 04/30/17 0035 05/01/17 0426  NA 137 137 138  K 3.6 3.6 3.4*  CL 105 105 107  CO2 26 26 25  GLUCOSE 129* 119* 108*  BUN 13 8 5*  CREATININE 0.95 0.92 0.94  CALCIUM 9.0 8.7* 9.0   GFR Estimated Creatinine Clearance: 99.2 mL/min (by C-G formula based on SCr of 0.94 mg/dL). Liver Function Tests:  Recent Labs Lab 04/30/17 0035 05/01/17 0426  AST 220* 122*  ALT 184* 214*  ALKPHOS 85 109  BILITOT 2.9* 3.8*  PROT 6.4* 6.5  ALBUMIN 3.6 3.5    Recent Labs Lab 04/29/17 1411  LIPASE 58*   CBC:  Recent Labs Lab 04/29/17 1411 04/30/17 0035  WBC 8.0 10.8*  HGB 13.7 13.1  HCT 41.3 39.6  MCV 87.3 87.4  PLT 224 187   Cardiac Enzymes:  Recent Labs Lab 04/29/17 1411 04/29/17 1750 04/30/17 0337  04/30/17 0845 04/30/17 1538  TROPONINI <0.03 <0.03 <0.03 <0.03 <0.03   D-Dimer:  Recent Labs  04/29/17 2243  DDIMER 0.51*   Thyroid function studies:  Recent Labs  04/30/17 0035  TSH 0.592   Sepsis Labs:  Recent Labs Lab 04/29/17 1411 04/30/17 0035 04/30/17 0036 04/30/17 0337  WBC 8.0 10.8*  --   --   LATICACIDVEN  --   --  0.9 0.7    Microbiology No results found for this or any previous visit (from the past 240 hour(s)).  Procedures and diagnostic studies:  04/30/17: Ct Angio Chest Pe W Or Wo Contrast: No PE.   04/30/17: Ct Abdomen Pelvis W Contrast: Fatty liver. Gallbladder/ducts unremarkable.  04/30/17: Dg Chest Portable 1 View: My independent review of the image shows: Normal chest.   04/30/17: RUQ [place]: Mildly thickened gallbladder wall, no gallstones. Negative Murphy sign.  Medications:   . enoxaparin (LOVENOX) injection  40 mg Subcutaneous Q24H  .  morphine injection  4 mg Intravenous Once   Continuous Infusions: . piperacillin-tazobactam (ZOSYN)  IV 3.375 g (05/01/17 0620)     LOS: 2 days   RAMA,CHRISTINA  [facility] Pager [phone]. If unable to reach me by pager, please call my cell phone at [phone].  *Please refer to [email or web address], password [email or web address] to get updated schedule on who will round on this patient, as hospitalists switch teams weekly. If 7PM-7AM, please contact night-coverage at [email or web address], password [email or web address] for any overnight needs.  05/01/2017, 8:11 AM

## 2017-05-01 NOTE — Discharge Summary (Addendum)
Physician Discharge Summary  Douglas Hester ZOX:[id number] DOB: 02/24/1961 DOA: 04/29/2017  PCP: Elijio Miles., MD  Admit date: 04/29/2017 Discharge date: 05/01/2017  Admitted From: Home Discharge disposition: Home   Recommendations for Outpatient Follow-Up:   1. Needs outpatient F/U with GI for consideration of endoscopy/EUS for further evaluation of possible PUD and gallbladder disease. 2. Needs repeat CMET in 1 week.   Discharge Diagnosis:   Principal Problem:   Chest pain likely from acute cholecystitis Active Problems:   Fever, unspecified   Tachycardia   Hyperglycemia   Hypokalemia   Elevated LFTs   Elevated lipase   Discharge Condition: Improved.  Diet recommendation: Low sodium, heart healthy.   History of Present Illness:   Douglas Hester is an 56 y.o. male with a PMH of depression who was admitted 04/29/17 for evaluation of substernal chest pain radiating to the back unrelieved by antacids. Initial chest x-ray negative. Troponin negative 3.   Hospital Course by Problem:   Principal Problem:   Chest pain Chest x-ray negative. No evidence of dissection. Troponin negative 3. D-dimer mildly elevated. Evaluated by cardiologist who recommended outpatient F/U for consideration of a stress test.  Active Problems:   Probable cholecystitis, Fever, unspecified/elevated LFTs Still running low-grade fevers. Chest pain appears to be referred from abdominal process. Placed on empiric vancomycin and Zosyn for sepsis, currently only on Zosyn. Lactic acid was not elevated, which was reassuring. MRCP negative other than mild hepatic steatosis. Bilirubin continues to rise, AST improved but ALT worse. Acute hepatitis panel negative. GI consulted and recommended surgical evaluation. Subsequently evaluated by surgery and they recommended outpatient F/U with GI and consideration of EGD/EUS. Will need a F/U CMET in 1 week. Will d/c on 1 week of tx w/ Cipro/Flagyl for suspected  cholecystitis.    Tachycardia Likely from sepsis. Improved.    Hyperglycemia Hemoglobin A1c 5.2%.    Hypokalemia We'll give 40 mEq of oral replacement today.   Medical Consultants:    Gastroenterology  Surgery   Discharge Exam:   Vitals:   04/30/17 2100 05/01/17 0554  BP: 125/78 106/69  Pulse: 92 91  Resp: 18 16  Temp: 99.1 F (37.3 C) 98.4 F (36.9 C)  SpO2: 99% 98%   Vitals:   04/30/17 0713 04/30/17 1500 04/30/17 2100 05/01/17 0554  BP: 119/72  125/78 106/69  Pulse: (!) 104  92 91  Resp:  20 18 16   Temp: 100 F (37.8 C) 98.3 F (36.8 C) 99.1 F (37.3 C) 98.4 F (36.9 C)  TempSrc: Oral Oral Oral Oral  SpO2: 95% 96% 99% 98%  Weight:      Height:        General exam: Appears calm and comfortable.  Respiratory system: Clear to auscultation. Respiratory effort normal. Cardiovascular system: S1 & S2 heard, RRR. No JVD,  rubs, gallops or clicks. No murmurs. Gastrointestinal system: Abdomen is nondistended, soft and nontender. No organomegaly or masses felt. Normal bowel sounds heard. Central nervous system: Alert and oriented. No focal neurological deficits. Extremities: No clubbing,  or cyanosis. No edema. Skin: No rashes, lesions or ulcers. Psychiatry: Judgement and insight appear normal. Mood & affect appropriate.    The results of significant diagnostics from this hospitalization (including imaging, microbiology, ancillary and laboratory) are listed below for reference.     Procedures and Diagnostic Studies:   Ct Angio Chest Pe W Or Wo Contrast  Result Date: 04/30/2017 CLINICAL DATA:  Epigastric abdominal pain. History of hernia repair in chronic reflux. EXAM: CT  ANGIOGRAPHY CHEST CT ABDOMEN AND PELVIS WITH CONTRAST TECHNIQUE: Multidetector CT imaging of the chest was performed using the standard protocol during bolus administration of intravenous contrast. Multiplanar CT image reconstructions and MIPs were obtained to evaluate the vascular anatomy.  Multidetector CT imaging of the abdomen and pelvis was performed using the standard protocol during bolus administration of intravenous contrast. CONTRAST:  100 mL Isovue 370 COMPARISON:  None. FINDINGS: CTA CHEST FINDINGS Cardiovascular: Satisfactory opacification of the pulmonary arteries to the segmental level. No evidence of pulmonary embolism. Normal heart size. No pericardial effusion. Normal caliber thoracic aorta. No aneurysm or dissection. Great vessel origins are patent. Mediastinum/Nodes: No enlarged mediastinal, hilar, or axillary lymph nodes. Thyroid gland, trachea, and esophagus demonstrate no significant findings. Lungs/Pleura: Mild dependent changes in the lung bases. No airspace disease or consolidation. No pleural effusions. No pneumothorax. Airways are patent. Musculoskeletal: Degenerative changes in the spine. No destructive bone lesions. Review of the MIP images confirms the above findings. CT ABDOMEN and PELVIS FINDINGS Hepatobiliary: Mild diffuse fatty infiltration of the liver. No focal lesions. Gallbladder and bile ducts are unremarkable. Pancreas: Unremarkable. No pancreatic ductal dilatation or surrounding inflammatory changes. Spleen: Normal in size without focal abnormality. Adrenals/Urinary Tract: No adrenal gland nodules. Kidneys are symmetrical with homogeneous nephrograms. No hydronephrosis or hydroureter. No mass lesions identified. Bladder wall is not thickened and no filling defects are identified. Stomach/Bowel: Stomach is within normal limits. Appendix appears normal. No evidence of bowel wall thickening, distention, or inflammatory changes. Diverticulum of the third portion of the duodenum. Vascular/Lymphatic: No significant vascular findings are present. No enlarged abdominal or pelvic lymph nodes. Reproductive: Prostate is unremarkable. Other: No abdominal wall hernia or abnormality. No abdominopelvic ascites. Musculoskeletal: No acute or significant osseous findings. Review  of the MIP images confirms the above findings. IMPRESSION: 1. No evidence of significant pulmonary embolus. 2. No evidence of active pulmonary disease. 3. Mild diffuse fatty infiltration of the liver. 4. Duodenal diverticulum. 5. No evidence of bowel obstruction or inflammation. Electronically Signed   By: Burman Nieves M.D.   On: 04/30/2017 00:23   Ct Abdomen Pelvis W Contrast  Result Date: 04/30/2017 CLINICAL DATA:  Epigastric abdominal pain. History of hernia repair in chronic reflux. EXAM: CT ANGIOGRAPHY CHEST CT ABDOMEN AND PELVIS WITH CONTRAST TECHNIQUE: Multidetector CT imaging of the chest was performed using the standard protocol during bolus administration of intravenous contrast. Multiplanar CT image reconstructions and MIPs were obtained to evaluate the vascular anatomy. Multidetector CT imaging of the abdomen and pelvis was performed using the standard protocol during bolus administration of intravenous contrast. CONTRAST:  100 mL Isovue 370 COMPARISON:  None. FINDINGS: CTA CHEST FINDINGS Cardiovascular: Satisfactory opacification of the pulmonary arteries to the segmental level. No evidence of pulmonary embolism. Normal heart size. No pericardial effusion. Normal caliber thoracic aorta. No aneurysm or dissection. Great vessel origins are patent. Mediastinum/Nodes: No enlarged mediastinal, hilar, or axillary lymph nodes. Thyroid gland, trachea, and esophagus demonstrate no significant findings. Lungs/Pleura: Mild dependent changes in the lung bases. No airspace disease or consolidation. No pleural effusions. No pneumothorax. Airways are patent. Musculoskeletal: Degenerative changes in the spine. No destructive bone lesions. Review of the MIP images confirms the above findings. CT ABDOMEN and PELVIS FINDINGS Hepatobiliary: Mild diffuse fatty infiltration of the liver. No focal lesions. Gallbladder and bile ducts are unremarkable. Pancreas: Unremarkable. No pancreatic ductal dilatation or  surrounding inflammatory changes. Spleen: Normal in size without focal abnormality. Adrenals/Urinary Tract: No adrenal gland nodules. Kidneys are symmetrical with homogeneous  nephrograms. No hydronephrosis or hydroureter. No mass lesions identified. Bladder wall is not thickened and no filling defects are identified. Stomach/Bowel: Stomach is within normal limits. Appendix appears normal. No evidence of bowel wall thickening, distention, or inflammatory changes. Diverticulum of the third portion of the duodenum. Vascular/Lymphatic: No significant vascular findings are present. No enlarged abdominal or pelvic lymph nodes. Reproductive: Prostate is unremarkable. Other: No abdominal wall hernia or abnormality. No abdominopelvic ascites. Musculoskeletal: No acute or significant osseous findings. Review of the MIP images confirms the above findings. IMPRESSION: 1. No evidence of significant pulmonary embolus. 2. No evidence of active pulmonary disease. 3. Mild diffuse fatty infiltration of the liver. 4. Duodenal diverticulum. 5. No evidence of bowel obstruction or inflammation. Electronically Signed   By: Burman Nieves M.D.   On: 04/30/2017 00:23   Mr 3d Recon At Scanner  Result Date: 05/01/2017 CLINICAL DATA:  Substernal chest pain after breakfast. Epigastric pain. EXAM: MRI ABDOMEN WITHOUT AND WITH CONTRAST (INCLUDING MRCP) TECHNIQUE: Multiplanar multisequence MR imaging of the abdomen was performed both before and after the administration of intravenous contrast. Heavily T2-weighted images of the biliary and pancreatic ducts were obtained, and three-dimensional MRCP images were rendered by post processing. CONTRAST:  20mL MULTIHANCE GADOBENATE DIMEGLUMINE 529 MG/ML IV SOLN COMPARISON:  CT abdomen pelvis 04/29/2017 FINDINGS: Lower chest:  Lung bases are clear. Hepatobiliary: No focal hepatic lesion. No biliary duct dilatation. Gallbladder is normal. No gallstones evident. The common bile duct normal caliber.  Mild loss of signal intensity on the opposed phase imaging consistent mild hepatic steatosis. Postcontrast imaging demonstrates no enhancing hepatic lesion. Pancreas: Normal pancreatic parenchymal intensity. No ductal dilatation or inflammation. Spleen: Normal spleen. Adrenals/urinary tract: Adrenal glands and kidneys are normal. Stomach/Bowel: Stomach, duodenum and limited view of the bowel is unremarkable. Vascular/Lymphatic: Abdominal aortic normal caliber. No retroperitoneal periportal lymphadenopathy. Musculoskeletal: No aggressive osseous lesion IMPRESSION: 1. No explanation for abdominal pain. 2. Mild hepatic steatosis. 3. Normal biliary tree and gallbladder. 4. No pancreatic abnormality. Electronically Signed   By: Genevive Bi M.D.   On: 05/01/2017 07:39   Dg Chest Portable 1 View  Result Date: 04/29/2017 CLINICAL DATA:  Chest pain. EXAM: PORTABLE CHEST 1 VIEW COMPARISON:  None. FINDINGS: The heart size and mediastinal contours are within normal limits. Both lungs are clear. No pneumothorax or pleural effusion is noted. The visualized skeletal structures are unremarkable. IMPRESSION: No acute cardiopulmonary abnormality seen. Electronically Signed   By: Lupita Raider, M.D.   On: 04/29/2017 15:42   Mr Abdomen Mrcp Vivien Rossetti Contast  Result Date: 05/01/2017 CLINICAL DATA:  Substernal chest pain after breakfast. Epigastric pain. EXAM: MRI ABDOMEN WITHOUT AND WITH CONTRAST (INCLUDING MRCP) TECHNIQUE: Multiplanar multisequence MR imaging of the abdomen was performed both before and after the administration of intravenous contrast. Heavily T2-weighted images of the biliary and pancreatic ducts were obtained, and three-dimensional MRCP images were rendered by post processing. CONTRAST:  20mL MULTIHANCE GADOBENATE DIMEGLUMINE 529 MG/ML IV SOLN COMPARISON:  CT abdomen pelvis 04/29/2017 FINDINGS: Lower chest:  Lung bases are clear. Hepatobiliary: No focal hepatic lesion. No biliary duct dilatation.  Gallbladder is normal. No gallstones evident. The common bile duct normal caliber. Mild loss of signal intensity on the opposed phase imaging consistent mild hepatic steatosis. Postcontrast imaging demonstrates no enhancing hepatic lesion. Pancreas: Normal pancreatic parenchymal intensity. No ductal dilatation or inflammation. Spleen: Normal spleen. Adrenals/urinary tract: Adrenal glands and kidneys are normal. Stomach/Bowel: Stomach, duodenum and limited view of the bowel is unremarkable. Vascular/Lymphatic: Abdominal  aortic normal caliber. No retroperitoneal periportal lymphadenopathy. Musculoskeletal: No aggressive osseous lesion IMPRESSION: 1. No explanation for abdominal pain. 2. Mild hepatic steatosis. 3. Normal biliary tree and gallbladder. 4. No pancreatic abnormality. Electronically Signed   By: Genevive Bi M.D.   On: 05/01/2017 07:39   US Abdomen Limited Ruq  Result Date: 04/30/2017 CLINICAL DATA:  Elevated LFTs. EXAM: ULTRASOUND ABDOMEN LIMITED RIGHT UPPER QUADRANT COMPARISON:  Abdominal CT 04/29/2017 FINDINGS: Gallbladder: Gallbladder is decompressed but there is wall thickening measuring up to 0.5 cm. No gallstones or sludge. Patient does not have a sonographic Murphy sign. Common bile duct: Diameter: 0.4 cm Liver: Increased echogenicity of the liver parenchyma. No intrahepatic biliary dilatation. No suspicious liver lesions. Portal vein is patent on color Doppler imaging with normal direction of blood flow towards the liver. IMPRESSION: Gallbladder wall is mildly thickened but no gallbladder distension and no gallstones. Findings are nonspecific with regards to gallbladder inflammation. Slightly increased echogenicity in the liver parenchyma. There may be a component of hepatic steatosis. Electronically Signed   By: Richarda Overlie M.D.   On: 04/30/2017 10:11     Labs:   Basic Metabolic Panel:  Recent Labs Lab 04/29/17 1411 04/30/17 0035 05/01/17 0426  NA 137 137 138  K 3.6 3.6 3.4*    CL 105 105 107  CO2 GLUCOSE 129* 119* 108*  BUN 13 8 5*  CREATININE 0.95 0.92 0.94  CALCIUM 9.0 8.7* 9.0   GFR Estimated Creatinine Clearance: 99.2 mL/min (by C-G formula based on SCr of 0.94 mg/dL). Liver Function Tests:  Recent Labs Lab 04/30/17 0035 05/01/17 0426  AST 220* 122*  ALT 184* 214*  ALKPHOS 85 109  BILITOT 2.9* 3.8*  PROT 6.4* 6.5  ALBUMIN 3.6 3.5    Recent Labs Lab 04/29/17 1411  LIPASE 58*    CBC:  Recent Labs Lab 04/29/17 1411 04/30/17 0035  WBC 8.0 10.8*  HGB 13.7 13.1  HCT 41.3 39.6  MCV 87.3 87.4  PLT 224 187   Cardiac Enzymes:  Recent Labs Lab 04/29/17 1411 04/29/17 1750 04/30/17 0337 04/30/17 0845 04/30/17 1538  TROPONINI <0.03 <0.03 <0.03 <0.03 <0.03   D-Dimer  Recent Labs  04/29/17 2243  DDIMER 0.51*   Hgb A1c  Recent Labs  04/30/17 0924  HGBA1C 5.2   Lipid Profile  Recent Labs  05/01/17 0426  CHOL 137  HDL 48  LDLCALC 75  TRIG 72  CHOLHDL 2.9   Thyroid function studies  Recent Labs  04/30/17 0035  TSH 0.592   Anemia work up No results for input(s): VITAMINB12, FOLATE, FERRITIN, TIBC, IRON, RETICCTPCT in the last 72 hours. Microbiology Recent Results (from the past 240 hour(s))  Culture, blood (routine x 2)     Status: None (Preliminary result)   Collection Time: 04/29/17 10:40 PM  Result Value Ref Range Status   Specimen Description BLOOD LEFT ANTECUBITAL  Final   Special Requests   Final    BOTTLES DRAWN AEROBIC ONLY Blood Culture adequate volume   Culture NO GROWTH 1 DAY  Final   Report Status PENDING  Incomplete  Culture, blood (routine x 2)     Status: None (Preliminary result)   Collection Time: 04/29/17 10:44 PM  Result Value Ref Range Status   Specimen Description BLOOD RIGHT HAND  Final   Special Requests IN PEDIATRIC BOTTLE Blood Culture adequate volume  Final   Culture NO GROWTH 1 DAY  Final   Report Status PENDING  Incomplete  Discharge Instructions:    Discharge Instructions    Call MD for:  persistant nausea and vomiting    Complete by:  As directed    Call MD for:  severe uncontrolled pain    Complete by:  As directed    Call MD for:  temperature >100.4    Complete by:  As directed    Diet - low sodium heart healthy    Complete by:  As directed    Discharge instructions    Complete by:  As directed    Follow up with your gastroenterologist to schedule an outpatient endoscopic ultrasound.  You will need repeat labs in 1 week to follow up on your elevated liver function tests.   Increase activity slowly    Complete by:  As directed      Allergies as of 05/01/2017   No Known Allergies     Medication List    TAKE these medications   buPROPion 300 MG 24 hr tablet Commonly known as:  WELLBUTRIN XL Take 300 mg by mouth daily.   ciprofloxacin 500 MG tablet Commonly known as:  CIPRO Take 1 tablet (500 mg total) by mouth 2 (two) times daily.   lamoTRIgine 100 MG tablet Commonly known as:  LAMICTAL Take 100 mg by mouth daily.   metroNIDAZOLE 500 MG tablet Commonly known as:  FLAGYL Take 1 tablet (500 mg total) by mouth 3 (three) times daily.   pantoprazole 40 MG tablet Commonly known as:  PROTONIX Take 40 mg by mouth daily.   RESTASIS 0.05 % ophthalmic emulsion Generic drug:  cycloSPORINE Place 2 drops into both eyes 2 (two) times daily.      Follow-up Information    Elijio Miles., MD Follow up in 1 week(s).   Specialty:  Family Medicine Why:  For blood work.  You need a repeat of your liver function tests in 1 week. Contact information: [address] [place] [address] [phone]            Time coordinating discharge: >  Signed:  RAMA,CHRISTINA  Pager [phone] [facility] 05/01/2017, 3:44 PM

## 2017-05-01 NOTE — Consult Note (Addendum)
[facility] Consult: 11:19 AM 05/01/2017  LOS: 2 days    Referring Provider: Dr Rama  Primary Care Physician:  Elijio Miles., MD in [facility] Primary Gastroenterologist: in [facility].     Reason for Consultation:  Abdominal pain and elevated LFTs.   HPI: Douglas Hester is a 56 y.o. male.  PMH PUD in early 1990s, underwent EGD at that time.  GERD.  S/p left inguinal hernia repair with mesh 7 to 10 years ago.  Colonoscopy in [place] ~ 4 years ago, polyps of unknown type removed.    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 On Sunday morning he ate an Egg McMuffin, hashbrowns and drank tea at [employer].  He eats this same breakfast meal with some regularity and never has a problem.  However in this case, within 20 minutes he was having fairly intense pain in his epigastrium, right upper quadrant. It radiated into his scapular region on the right and at some point may have even been radiating up into his chest.  He went to the pharmacy and picked up some antacids as well as Pepto-Bismol. When he got home he took for antacids and I large swig of Pepto along with his usual pantoprazole. The symptoms didn't get any better so he came to the ED in [facility]. Ultimately he was transferred and admitted to [facility] hospital.   Initially treated as a rule out for PE and MI, both were ruled out. Has not had any recurrence of his symptoms.  He is tolerating soft diet. T bili 2.9 >> 3.8. Alkaline phosphatase 85 >>109. AST/ALT 220/184 >> 122/214.  04/29/17 CT abdomen/pelvis and CTA chest.  Negative for pulmonary disease, negative for PE. Did show mild fatty liver and duodenal diverticulum. 04/30/17 abdominal ultrasound:  Mild thickening of GB wall without distention or gallstones. Slight  increased echo in the liver, question hepatic steatosis.  04/30/17 MRCP. Revealed mild hepatic steatosis. Normal biliary tree and gallbladder. No pancreatic abnormality. No explanation for abdominal pain.     Patient has been taking Protonix for about a year but will occasionally have breakthrough reflux symptoms for which he self treats with Tums. These breakthrough reflux symptoms occur usually in the morning when he goes to bed as he works the night shift.  Patient drinks maybe one beer on the weekends. Does not use acetaminophen products. Uses aspirin for pain relief, 650 mg at a time, at most twice a week. Within the last few weeks he has used sinus formula Advil on a few occasions.  Patient's been under stress with a relationship with his girlfriend and as a result he feels like his appetite is reduced. His weight is stable.  Family history notable for gallbladder disease and cholecystectomy in his sister when she was in her fifties, she is in her sixties now. Father died with lymphoma. Mother, a smoker died with lung cancer. No family history of colon polyps or colon cancer.  It was a long time ago that he had the ulcer  disease and endoscopy. He does not recall being told anything about Helicobacter pylori one way or another.    Past Medical History:  Diagnosis Date  . Anxiety     Past Surgical History:  Procedure Laterality Date  . HERNIA REPAIR      Prior to Admission medications   Medication Sig Start Date End Date Taking? Authorizing Provider  buPROPion (WELLBUTRIN XL) 300 MG 24 hr tablet Take 300 mg by mouth daily. 02/19/17  Yes [provider]  cycloSPORINE (RESTASIS) 0.05 % ophthalmic emulsion Place 2 drops into both eyes 2 (two) times daily. 10/13/15  Yes [provider]  lamoTRIgine (LAMICTAL) 100 MG tablet Take 100 mg by mouth daily. 02/19/17 02/19/18 Yes [provider]  pantoprazole (PROTONIX) 40 MG tablet Take 40 mg by mouth daily. 02/19/17 02/19/18 Yes  [provider]    Scheduled Meds: . enoxaparin (LOVENOX) injection  40 mg Subcutaneous Q24H   Infusions: . piperacillin-tazobactam (ZOSYN)  IV 3.375 g (05/01/17 0620)   PRN Meds: acetaminophen **OR** acetaminophen, nitroGLYCERIN   Allergies as of 04/29/2017  . (No Known Allergies)    Family History  Problem Relation Age of Onset  . Lung cancer Mother   . Lymphoma Father   . Valvular heart disease Sister     Social History   Social History  . Marital status: Single    Spouse name: N/A  . Number of children: N/A  . Years of education: N/A   Occupational History  . Not on file.   Social History Main Topics  . Smoking status: Never Smoker  . Smokeless tobacco: Never Used  . Alcohol use No  . Drug use: No  . Sexual activity: Not on file   Other Topics Concern  . Not on file   Social History Narrative  . No narrative on file    REVIEW OF SYSTEMS: Constitutional:  No weakness, no fatigue ENT:  No nose bleeds.  Sinus congestion and headache a few weeks ago Pulm:  No shortness of breath or cough. CV:  No palpitations, no LE edema. enerally does not have chest pain and definitely doesn't have anginal symptoms. GU:  No hematuria, no frequency GI:  Per HPI Heme:  No unusual bleeding or bruising.   Transfusions:  Ever had transfusion. Neuro:  No headaches, no peripheral tingling or numbness Derm:  No itching, no rash or sores.  Endocrine:  Did feel some chills on Sunday when he was having the pain.  No polyuria or dysuria Immunization:  Did not inquire as to recent immunizations. Travel:  None beyond local counties in last few months.    PHYSICAL EXAM: Vital signs in last 24 hours: Vitals:   04/30/17 2100 05/01/17 0554  BP: 125/78 106/69  Pulse: 92 91  Resp: 18 16  Temp: 99.1 F (37.3 C) 98.4 F (36.9 C)  SpO2: 99% 98%   Wt Readings from Last 3 Encounters:  04/29/17 91.5 kg (201 lb 11.2 oz)    General: pleasant, looks well. Comfortable.  Not at all ill appearing. Head:  No signs of head trauma. No facial asymmetry or swelling  Eyes:  No scleral icterus, no conjunctival pallor Ears:  Not hard of hearing  Nose:  No congestion or discharge Mouth:  Tongue midline. Oral mucosa pink, moist, clear. Good dentition. Neck:  No JVD, no masses, no thyromegaly. Lungs:  No labored breathing or cough. Lungs clear with excellent breath sounds bilaterally. Heart: RRR. No MRG. S1, S2 present. Abdomen:  Nonobese.  Nontender. Active bowel sounds. No distention. No bruits, no hernias, no organomegaly..   Rectal: deferred   Musc/Skeltl: no joint swelling, redness or gross deformity. Extremities:  No CCE.  Neurologic:  Alert. Good historian. Oriented times 3. No limb weakness. No tremors. Skin:  No rashes, no telangiectasia, no sores, no significant purpura or bruising. Tattoos:  Professional quality tattoo, placed approximately 2014, on the right arm Nodes:  No cervical adenopathy.   Psych:  Comment, pleasant, cooperative. Not anxious or depressed.    Intake/Output from previous day: 10/01 0701 - 10/02 0700 In: 992 [P.O.:942; IV Piggyback:50] Out: 2100 [Urine:2100] Intake/Output this shift: No intake/output data recorded.  LAB RESULTS:  Recent Labs  04/29/17 1411 04/30/17 0035  WBC 8.0 10.8*  HGB 13.7 13.1  HCT 41.3 39.6  PLT 224 187   BMET Lab Results  Component Value Date   NA 138 05/01/2017   NA 137 04/30/2017   NA 137 04/29/2017   K 3.4 (L) 05/01/2017   K 3.6 04/30/2017   K 3.6 04/29/2017   CL 107 05/01/2017   CL 105 04/30/2017   CL 105 04/29/2017   CO2 25 05/01/2017   CO2 26 04/30/2017   CO2 26 04/29/2017   GLUCOSE 108 (H) 05/01/2017   GLUCOSE 119 (H) 04/30/2017   GLUCOSE 129 (H) 04/29/2017   BUN 5 (L) 05/01/2017   BUN 8 04/30/2017   BUN 13 04/29/2017   CREATININE 0.94 05/01/2017   CREATININE 0.92 04/30/2017   CREATININE 0.95 04/29/2017   CALCIUM 9.0 05/01/2017   CALCIUM 8.7 (L) 04/30/2017   CALCIUM 9.0  04/29/2017   LFT  Recent Labs  04/30/17 0035 05/01/17 0426  PROT 6.4* 6.5  ALBUMIN 3.6 3.5  AST 220* 122*  ALT 184* 214*  ALKPHOS 85 109  BILITOT 2.9* 3.8*   PT/INR No results found for: INR, PROTIME Hepatitis Panel  Recent Labs  04/30/17 1538  HEPBSAG Negative  HCVAB <0.1  HEPAIGM Negative  HEPBIGM Negative   C-Diff No components found for: CDIFF Lipase     Component Value Date/Time   LIPASE 58 (H) 04/29/2017 1411    Drugs of Abuse  No results found for: LABOPIA, COCAINSCRNUR, LABBENZ, AMPHETMU, THCU, LABBARB   RADIOLOGY STUDIES: Ct Angio Chest Pe W Or Wo Contrast  Result Date: 04/30/2017 CLINICAL DATA:  Epigastric abdominal pain. History of hernia repair in chronic reflux. EXAM: CT ANGIOGRAPHY CHEST CT ABDOMEN AND PELVIS WITH CONTRAST TECHNIQUE: Multidetector CT imaging of the chest was performed using the standard protocol during bolus administration of intravenous contrast. Multiplanar CT image reconstructions and MIPs were obtained to evaluate the vascular anatomy. Multidetector CT imaging of the abdomen and pelvis was performed using the standard protocol during bolus administration of intravenous contrast. CONTRAST:  100 mL Isovue 370 COMPARISON:  None. FINDINGS: CTA CHEST FINDINGS Cardiovascular: Satisfactory opacification of the pulmonary arteries to the segmental level. No evidence of pulmonary embolism. Normal heart size. No pericardial effusion. Normal caliber thoracic aorta. No aneurysm or dissection. Great vessel origins are patent. Mediastinum/Nodes: No enlarged mediastinal, hilar, or axillary lymph nodes. Thyroid gland, trachea, and esophagus demonstrate no significant findings. Lungs/Pleura: Mild dependent changes in the lung bases. No airspace disease or consolidation. No pleural effusions. No pneumothorax. Airways are patent. Musculoskeletal: Degenerative changes in the spine. No destructive bone lesions. Review of the MIP images confirms the above  findings. CT ABDOMEN and PELVIS FINDINGS Hepatobiliary: Mild diffuse fatty infiltration of the liver. No focal lesions. Gallbladder and bile ducts are unremarkable. Pancreas:  Unremarkable. No pancreatic ductal dilatation or surrounding inflammatory changes. Spleen: Normal in size without focal abnormality. Adrenals/Urinary Tract: No adrenal gland nodules. Kidneys are symmetrical with homogeneous nephrograms. No hydronephrosis or hydroureter. No mass lesions identified. Bladder wall is not thickened and no filling defects are identified. Stomach/Bowel: Stomach is within normal limits. Appendix appears normal. No evidence of bowel wall thickening, distention, or inflammatory changes. Diverticulum of the third portion of the duodenum. Vascular/Lymphatic: No significant vascular findings are present. No enlarged abdominal or pelvic lymph nodes. Reproductive: Prostate is unremarkable. Other: No abdominal wall hernia or abnormality. No abdominopelvic ascites. Musculoskeletal: No acute or significant osseous findings. Review of the MIP images confirms the above findings. IMPRESSION: 1. No evidence of significant pulmonary embolus. 2. No evidence of active pulmonary disease. 3. Mild diffuse fatty infiltration of the liver. 4. Duodenal diverticulum. 5. No evidence of bowel obstruction or inflammation. Electronically Signed   By: Burman Nieves M.D.   On: 04/30/2017 00:23   Ct Abdomen Pelvis W Contrast  Result Date: 04/30/2017 CLINICAL DATA:  Epigastric abdominal pain. History of hernia repair in chronic reflux. EXAM: CT ANGIOGRAPHY CHEST CT ABDOMEN AND PELVIS WITH CONTRAST TECHNIQUE: Multidetector CT imaging of the chest was performed using the standard protocol during bolus administration of intravenous contrast. Multiplanar CT image reconstructions and MIPs were obtained to evaluate the vascular anatomy. Multidetector CT imaging of the abdomen and pelvis was performed using the standard protocol during bolus  administration of intravenous contrast. CONTRAST:  100 mL Isovue 370 COMPARISON:  None. FINDINGS: CTA CHEST FINDINGS Cardiovascular: Satisfactory opacification of the pulmonary arteries to the segmental level. No evidence of pulmonary embolism. Normal heart size. No pericardial effusion. Normal caliber thoracic aorta. No aneurysm or dissection. Great vessel origins are patent. Mediastinum/Nodes: No enlarged mediastinal, hilar, or axillary lymph nodes. Thyroid gland, trachea, and esophagus demonstrate no significant findings. Lungs/Pleura: Mild dependent changes in the lung bases. No airspace disease or consolidation. No pleural effusions. No pneumothorax. Airways are patent. Musculoskeletal: Degenerative changes in the spine. No destructive bone lesions. Review of the MIP images confirms the above findings. CT ABDOMEN and PELVIS FINDINGS Hepatobiliary: Mild diffuse fatty infiltration of the liver. No focal lesions. Gallbladder and bile ducts are unremarkable. Pancreas: Unremarkable. No pancreatic ductal dilatation or surrounding inflammatory changes. Spleen: Normal in size without focal abnormality. Adrenals/Urinary Tract: No adrenal gland nodules. Kidneys are symmetrical with homogeneous nephrograms. No hydronephrosis or hydroureter. No mass lesions identified. Bladder wall is not thickened and no filling defects are identified. Stomach/Bowel: Stomach is within normal limits. Appendix appears normal. No evidence of bowel wall thickening, distention, or inflammatory changes. Diverticulum of the third portion of the duodenum. Vascular/Lymphatic: No significant vascular findings are present. No enlarged abdominal or pelvic lymph nodes. Reproductive: Prostate is unremarkable. Other: No abdominal wall hernia or abnormality. No abdominopelvic ascites. Musculoskeletal: No acute or significant osseous findings. Review of the MIP images confirms the above findings. IMPRESSION: 1. No evidence of significant pulmonary  embolus. 2. No evidence of active pulmonary disease. 3. Mild diffuse fatty infiltration of the liver. 4. Duodenal diverticulum. 5. No evidence of bowel obstruction or inflammation. Electronically Signed   By: Burman Nieves M.D.   On: 04/30/2017 00:23   Mr 3d Recon At Scanner  Result Date: 05/01/2017 CLINICAL DATA:  Substernal chest pain after breakfast. Epigastric pain. EXAM: MRI ABDOMEN WITHOUT AND WITH CONTRAST (INCLUDING MRCP) TECHNIQUE: Multiplanar multisequence MR imaging of the abdomen was performed both before and after the administration of intravenous contrast. Heavily T2-weighted  images of the biliary and pancreatic ducts were obtained, and three-dimensional MRCP images were rendered by post processing. CONTRAST:  20mL MULTIHANCE GADOBENATE DIMEGLUMINE 529 MG/ML IV SOLN COMPARISON:  CT abdomen pelvis 04/29/2017 FINDINGS: Lower chest:  Lung bases are clear. Hepatobiliary: No focal hepatic lesion. No biliary duct dilatation. Gallbladder is normal. No gallstones evident. The common bile duct normal caliber. Mild loss of signal intensity on the opposed phase imaging consistent mild hepatic steatosis. Postcontrast imaging demonstrates no enhancing hepatic lesion. Pancreas: Normal pancreatic parenchymal intensity. No ductal dilatation or inflammation. Spleen: Normal spleen. Adrenals/urinary tract: Adrenal glands and kidneys are normal. Stomach/Bowel: Stomach, duodenum and limited view of the bowel is unremarkable. Vascular/Lymphatic: Abdominal aortic normal caliber. No retroperitoneal periportal lymphadenopathy. Musculoskeletal: No aggressive osseous lesion IMPRESSION: 1. No explanation for abdominal pain. 2. Mild hepatic steatosis. 3. Normal biliary tree and gallbladder. 4. No pancreatic abnormality. Electronically Signed   By: Genevive Bi M.D.   On: 05/01/2017 07:39   Dg Chest Portable 1 View  Result Date: 04/29/2017 CLINICAL DATA:  Chest pain. EXAM: PORTABLE CHEST 1 VIEW COMPARISON:  None.  FINDINGS: The heart size and mediastinal contours are within normal limits. Both lungs are clear. No pneumothorax or pleural effusion is noted. The visualized skeletal structures are unremarkable. IMPRESSION: No acute cardiopulmonary abnormality seen. Electronically Signed   By: Lupita Raider, M.D.   On: 04/29/2017 15:42   Mr Abdomen Mrcp Vivien Rossetti Contast  Result Date: 05/01/2017 CLINICAL DATA:  Substernal chest pain after breakfast. Epigastric pain. EXAM: MRI ABDOMEN WITHOUT AND WITH CONTRAST (INCLUDING MRCP) TECHNIQUE: Multiplanar multisequence MR imaging of the abdomen was performed both before and after the administration of intravenous contrast. Heavily T2-weighted images of the biliary and pancreatic ducts were obtained, and three-dimensional MRCP images were rendered by post processing. CONTRAST:  20mL MULTIHANCE GADOBENATE DIMEGLUMINE 529 MG/ML IV SOLN COMPARISON:  CT abdomen pelvis 04/29/2017 FINDINGS: Lower chest:  Lung bases are clear. Hepatobiliary: No focal hepatic lesion. No biliary duct dilatation. Gallbladder is normal. No gallstones evident. The common bile duct normal caliber. Mild loss of signal intensity on the opposed phase imaging consistent mild hepatic steatosis. Postcontrast imaging demonstrates no enhancing hepatic lesion. Pancreas: Normal pancreatic parenchymal intensity. No ductal dilatation or inflammation. Spleen: Normal spleen. Adrenals/urinary tract: Adrenal glands and kidneys are normal. Stomach/Bowel: Stomach, duodenum and limited view of the bowel is unremarkable. Vascular/Lymphatic: Abdominal aortic normal caliber. No retroperitoneal periportal lymphadenopathy. Musculoskeletal: No aggressive osseous lesion IMPRESSION: 1. No explanation for abdominal pain. 2. Mild hepatic steatosis. 3. Normal biliary tree and gallbladder. 4. No pancreatic abnormality. Electronically Signed   By: Genevive Bi M.D.   On: 05/01/2017 07:39   US Abdomen Limited Ruq  Result Date:  04/30/2017 CLINICAL DATA:  Elevated LFTs. EXAM: ULTRASOUND ABDOMEN LIMITED RIGHT UPPER QUADRANT COMPARISON:  Abdominal CT 04/29/2017 FINDINGS: Gallbladder: Gallbladder is decompressed but there is wall thickening measuring up to 0.5 cm. No gallstones or sludge. Patient does not have a sonographic Murphy sign. Common bile duct: Diameter: 0.4 cm Liver: Increased echogenicity of the liver parenchyma. No intrahepatic biliary dilatation. No suspicious liver lesions. Portal vein is patent on color Doppler imaging with normal direction of blood flow towards the liver. IMPRESSION: Gallbladder wall is mildly thickened but no gallbladder distension and no gallstones. Findings are nonspecific with regards to gallbladder inflammation. Slightly increased echogenicity in the liver parenchyma. There may be a component of hepatic steatosis. Electronically Signed   By: Richarda Overlie M.D.   On: 04/30/2017 10:11  IMPRESSION:   *  Acute, now resolved, upper abdominal/right upper quadrant pain with radiation to the scapula and into the chest. Ruled out for PE and MI. LFTs are elevated.  CT, ultrasound, MRCP performed and revealed gallbladder wall thickening without stones or sludge as well as mild hepatic steatosis. Clinically this sounds very much like biliary colic possibly he passed a stone.  *  Remote history of ulcer disease. GERD. Takes pantoprazole daily. Recently has had some use of ibuprofen but not enlarged doses, also takes aspirin for pain relief a couple of times a week.  Suspicion for ulcer disease is not strong.    PLAN:     *  Spoke with surgical PA and surgery will take a look at the patient in regards to question of symptomatic gallbladder disease. At this point no plans for upper endoscopy.   Jennye Moccasin  05/01/2017, 11:19 AM Pager: [phone]      Attending physician's note   I have taken a history, examined the patient and reviewed the chart. I agree with the Advanced Practitioner's note,  impression and recommendations. Acute symptoms were typical for biliary pain. Imaging studies and LFT elevation strongly suggestive of gallbladder disease, suspected microlithiasis in GB and possibly in CBD. No additional GI evaluation planned at this time. Surgical consult to consider cholecystectomy with IOC. Repeat LFTs as outpatient. Outpatient GI follow up with his gastroenterologist in [facility].   Claudette Head, MD Clement[facility] Graham [phone] Mon-Fri 8a-5p [phone] after 5p, weekends, holidays

## 2017-05-01 NOTE — Consult Note (Signed)
[facility] Consult: 11:19 AM 05/01/2017  LOS: 2 days    Referring Provider: Dr Rama  Primary Care Physician:  Elijio Miles., MD in [facility] Primary Gastroenterologist: in [facility].     Reason for Consultation:  Abdominal pain and elevated LFTs.   HPI: Douglas Hester is a 56 y.o. male.  PMH PUD in early 1990s, underwent EGD at that time.  GERD.  S/p left inguinal hernia repair with mesh 7 to 10 years ago.  Colonoscopy in [place] ~ 4 years ago, polyps of unknown type removed.    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 On Sunday morning he ate an Egg McMuffin, hashbrowns and drank tea at [employer].  He eats this same breakfast meal with some regularity and never has a problem.  However in this case, within 20 minutes he was having fairly intense pain in his epigastrium, right upper quadrant. It radiated into his scapular region on the right and at some point may have even been radiating up into his chest.  He went to the pharmacy and picked up some antacids as well as Pepto-Bismol. When he got home he took for antacids and I large swig of Pepto along with his usual pantoprazole. The symptoms didn't get any better so he came to the ED in [facility]. Ultimately he was transferred and admitted to [facility].   Initially treated as a rule out for PE and MI, both were ruled out. Has not had any recurrence of his symptoms.  He is tolerating soft diet. T bili 2.9 >> 3.8. Alkaline phosphatase 85 >>109. AST/ALT 220/184 >> 122/214.  04/29/17 CT abdomen/pelvis and CTA chest.  Negative for pulmonary disease, negative for PE. Did show mild fatty liver and duodenal diverticulum. 04/30/17 abdominal ultrasound:  Mild thickening of GB wall without distention or gallstones. Slight  increased echo in the liver, question hepatic steatosis.  04/30/17 MRCP. Revealed mild hepatic steatosis. Normal biliary tree and gallbladder. No pancreatic abnormality. No explanation for abdominal pain.     Patient has been taking Protonix for about a year but will occasionally have breakthrough reflux symptoms for which he self treats with Tums. These breakthrough reflux symptoms occur usually in the morning when he goes to bed as he works the night shift.  Patient drinks maybe one beer on the weekends. Does not use acetaminophen products. Uses aspirin for pain relief, 650 mg at a time, at most twice a week. Within the last few weeks he has used sinus formula Advil on a few occasions.  Patient's been under stress with a relationship with his girlfriend and as a result he feels like his appetite is reduced. His weight is stable.  Family history notable for gallbladder disease and cholecystectomy in his sister when she was in her fifties, she is in her sixties now. Father died with lymphoma. Mother, a smoker died with lung cancer. No family history of colon polyps or colon cancer.  It was a long time ago that he had the ulcer  disease and endoscopy. He does not recall being told anything about Helicobacter pylori one way or another.    Past Medical History:  Diagnosis Date  . Anxiety     Past Surgical History:  Procedure Laterality Date  . HERNIA REPAIR      Prior to Admission medications   Medication Sig Start Date End Date Taking? Authorizing Provider  buPROPion (WELLBUTRIN XL) 300 MG 24 hr tablet Take 300 mg by mouth daily. 02/19/17  Yes [provider]  cycloSPORINE (RESTASIS) 0.05 % ophthalmic emulsion Place 2 drops into both eyes 2 (two) times daily. 10/13/15  Yes [provider]  lamoTRIgine (LAMICTAL) 100 MG tablet Take 100 mg by mouth daily. 02/19/17 02/19/18 Yes [provider]  pantoprazole (PROTONIX) 40 MG tablet Take 40 mg by mouth daily. 02/19/17 02/19/18 Yes  [provider]    Scheduled Meds: . enoxaparin (LOVENOX) injection  40 mg Subcutaneous Q24H   Infusions: . piperacillin-tazobactam (ZOSYN)  IV 3.375 g (05/01/17 0620)   PRN Meds: acetaminophen **OR** acetaminophen, nitroGLYCERIN   Allergies as of 04/29/2017  . (No Known Allergies)    Family History  Problem Relation Age of Onset  . Lung cancer Mother   . Lymphoma Father   . Valvular heart disease Sister     Social History   Social History  . Marital status: Single    Spouse name: N/A  . Number of children: N/A  . Years of education: N/A   Occupational History  . Not on file.   Social History Main Topics  . Smoking status: Never Smoker  . Smokeless tobacco: Never Used  . Alcohol use No  . Drug use: No  . Sexual activity: Not on file   Other Topics Concern  . Not on file   Social History Narrative  . No narrative on file    REVIEW OF SYSTEMS: Constitutional:  No weakness, no fatigue ENT:  No nose bleeds.  Sinus congestion and headache a few weeks ago Pulm:  No shortness of breath or cough. CV:  No palpitations, no LE edema. enerally does not have chest pain and definitely doesn't have anginal symptoms. GU:  No hematuria, no frequency GI:  Per HPI Heme:  No unusual bleeding or bruising.   Transfusions:  Ever had transfusion. Neuro:  No headaches, no peripheral tingling or numbness Derm:  No itching, no rash or sores.  Endocrine:  Did feel some chills on Sunday when he was having the pain.  No polyuria or dysuria Immunization:  Did not inquire as to recent immunizations. Travel:  None beyond local counties in last few months.    PHYSICAL EXAM: Vital signs in last 24 hours: Vitals:   04/30/17 2100 05/01/17 0554  BP: 125/78 106/69  Pulse: 92 91  Resp: 18 16  Temp: 99.1 F (37.3 C) 98.4 F (36.9 C)  SpO2: 99% 98%   Wt Readings from Last 3 Encounters:  04/29/17 91.5 kg (201 lb 11.2 oz)    General: pleasant, looks well. Comfortable.  Not at all ill appearing. Head:  No signs of head trauma. No facial asymmetry or swelling  Eyes:  No scleral icterus, no conjunctival pallor Ears:  Not hard of hearing  Nose:  No congestion or discharge Mouth:  Tongue midline. Oral mucosa pink, moist, clear. Good dentition. Neck:  No JVD, no masses, no thyromegaly. Lungs:  No labored breathing or cough. Lungs clear with excellent breath sounds bilaterally. Heart: RRR. No MRG. S1, S2 present. Abdomen:  Nonobese.  Nontender. Active bowel sounds. No distention. No bruits, no hernias, no organomegaly..   Rectal: deferred   Musc/Skeltl: no joint swelling, redness or gross deformity. Extremities:  No CCE.  Neurologic:  Alert. Good historian. Oriented times 3. No limb weakness. No tremors. Skin:  No rashes, no telangiectasia, no sores, no significant purpura or bruising. Tattoos:  Professional quality tattoo, placed approximately 2014, on the right arm Nodes:  No cervical adenopathy.   Psych:  Comment, pleasant, cooperative. Not anxious or depressed.    Intake/Output from previous day: 10/01 0701 - 10/02 0700 In: 992 [P.O.:942; IV Piggyback:50] Out: 2100 [Urine:2100] Intake/Output this shift: No intake/output data recorded.  LAB RESULTS:  Recent Labs  04/29/17 1411 04/30/17 0035  WBC 8.0 10.8*  HGB 13.7 13.1  HCT 41.3 39.6  PLT 224 187   BMET Lab Results  Component Value Date   NA 138 05/01/2017   NA 137 04/30/2017   NA 137 04/29/2017   K 3.4 (L) 05/01/2017   K 3.6 04/30/2017   K 3.6 04/29/2017   CL 107 05/01/2017   CL 105 04/30/2017   CL 105 04/29/2017   CO2 25 05/01/2017   CO2 26 04/30/2017   CO2 26 04/29/2017   GLUCOSE 108 (H) 05/01/2017   GLUCOSE 119 (H) 04/30/2017   GLUCOSE 129 (H) 04/29/2017   BUN 5 (L) 05/01/2017   BUN 8 04/30/2017   BUN 13 04/29/2017   CREATININE 0.94 05/01/2017   CREATININE 0.92 04/30/2017   CREATININE 0.95 04/29/2017   CALCIUM 9.0 05/01/2017   CALCIUM 8.7 (L) 04/30/2017   CALCIUM 9.0  04/29/2017   LFT  Recent Labs  04/30/17 0035 05/01/17 0426  PROT 6.4* 6.5  ALBUMIN 3.6 3.5  AST 220* 122*  ALT 184* 214*  ALKPHOS 85 109  BILITOT 2.9* 3.8*   PT/INR No results found for: INR, PROTIME Hepatitis Panel  Recent Labs  04/30/17 1538  HEPBSAG Negative  HCVAB <0.1  HEPAIGM Negative  HEPBIGM Negative   C-Diff No components found for: CDIFF Lipase     Component Value Date/Time   LIPASE 58 (H) 04/29/2017 1411    Drugs of Abuse  No results found for: LABOPIA, COCAINSCRNUR, LABBENZ, AMPHETMU, THCU, LABBARB   RADIOLOGY STUDIES: Ct Angio Chest Pe W Or Wo Contrast  Result Date: 04/30/2017 CLINICAL DATA:  Epigastric abdominal pain. History of hernia repair in chronic reflux. EXAM: CT ANGIOGRAPHY CHEST CT ABDOMEN AND PELVIS WITH CONTRAST TECHNIQUE: Multidetector CT imaging of the chest was performed using the standard protocol during bolus administration of intravenous contrast. Multiplanar CT image reconstructions and MIPs were obtained to evaluate the vascular anatomy. Multidetector CT imaging of the abdomen and pelvis was performed using the standard protocol during bolus administration of intravenous contrast. CONTRAST:  100 mL Isovue 370 COMPARISON:  None. FINDINGS: CTA CHEST FINDINGS Cardiovascular: Satisfactory opacification of the pulmonary arteries to the segmental level. No evidence of pulmonary embolism. Normal heart size. No pericardial effusion. Normal caliber thoracic aorta. No aneurysm or dissection. Great vessel origins are patent. Mediastinum/Nodes: No enlarged mediastinal, hilar, or axillary lymph nodes. Thyroid gland, trachea, and esophagus demonstrate no significant findings. Lungs/Pleura: Mild dependent changes in the lung bases. No airspace disease or consolidation. No pleural effusions. No pneumothorax. Airways are patent. Musculoskeletal: Degenerative changes in the spine. No destructive bone lesions. Review of the MIP images confirms the above  findings. CT ABDOMEN and PELVIS FINDINGS Hepatobiliary: Mild diffuse fatty infiltration of the liver. No focal lesions. Gallbladder and bile ducts are unremarkable. Pancreas:  Unremarkable. No pancreatic ductal dilatation or surrounding inflammatory changes. Spleen: Normal in size without focal abnormality. Adrenals/Urinary Tract: No adrenal gland nodules. Kidneys are symmetrical with homogeneous nephrograms. No hydronephrosis or hydroureter. No mass lesions identified. Bladder wall is not thickened and no filling defects are identified. Stomach/Bowel: Stomach is within normal limits. Appendix appears normal. No evidence of bowel wall thickening, distention, or inflammatory changes. Diverticulum of the third portion of the duodenum. Vascular/Lymphatic: No significant vascular findings are present. No enlarged abdominal or pelvic lymph nodes. Reproductive: Prostate is unremarkable. Other: No abdominal wall hernia or abnormality. No abdominopelvic ascites. Musculoskeletal: No acute or significant osseous findings. Review of the MIP images confirms the above findings. IMPRESSION: 1. No evidence of significant pulmonary embolus. 2. No evidence of active pulmonary disease. 3. Mild diffuse fatty infiltration of the liver. 4. Duodenal diverticulum. 5. No evidence of bowel obstruction or inflammation. Electronically Signed   By: Burman Nieves M.D.   On: 04/30/2017 00:23   Ct Abdomen Pelvis W Contrast  Result Date: 04/30/2017 CLINICAL DATA:  Epigastric abdominal pain. History of hernia repair in chronic reflux. EXAM: CT ANGIOGRAPHY CHEST CT ABDOMEN AND PELVIS WITH CONTRAST TECHNIQUE: Multidetector CT imaging of the chest was performed using the standard protocol during bolus administration of intravenous contrast. Multiplanar CT image reconstructions and MIPs were obtained to evaluate the vascular anatomy. Multidetector CT imaging of the abdomen and pelvis was performed using the standard protocol during bolus  administration of intravenous contrast. CONTRAST:  100 mL Isovue 370 COMPARISON:  None. FINDINGS: CTA CHEST FINDINGS Cardiovascular: Satisfactory opacification of the pulmonary arteries to the segmental level. No evidence of pulmonary embolism. Normal heart size. No pericardial effusion. Normal caliber thoracic aorta. No aneurysm or dissection. Great vessel origins are patent. Mediastinum/Nodes: No enlarged mediastinal, hilar, or axillary lymph nodes. Thyroid gland, trachea, and esophagus demonstrate no significant findings. Lungs/Pleura: Mild dependent changes in the lung bases. No airspace disease or consolidation. No pleural effusions. No pneumothorax. Airways are patent. Musculoskeletal: Degenerative changes in the spine. No destructive bone lesions. Review of the MIP images confirms the above findings. CT ABDOMEN and PELVIS FINDINGS Hepatobiliary: Mild diffuse fatty infiltration of the liver. No focal lesions. Gallbladder and bile ducts are unremarkable. Pancreas: Unremarkable. No pancreatic ductal dilatation or surrounding inflammatory changes. Spleen: Normal in size without focal abnormality. Adrenals/Urinary Tract: No adrenal gland nodules. Kidneys are symmetrical with homogeneous nephrograms. No hydronephrosis or hydroureter. No mass lesions identified. Bladder wall is not thickened and no filling defects are identified. Stomach/Bowel: Stomach is within normal limits. Appendix appears normal. No evidence of bowel wall thickening, distention, or inflammatory changes. Diverticulum of the third portion of the duodenum. Vascular/Lymphatic: No significant vascular findings are present. No enlarged abdominal or pelvic lymph nodes. Reproductive: Prostate is unremarkable. Other: No abdominal wall hernia or abnormality. No abdominopelvic ascites. Musculoskeletal: No acute or significant osseous findings. Review of the MIP images confirms the above findings. IMPRESSION: 1. No evidence of significant pulmonary  embolus. 2. No evidence of active pulmonary disease. 3. Mild diffuse fatty infiltration of the liver. 4. Duodenal diverticulum. 5. No evidence of bowel obstruction or inflammation. Electronically Signed   By: Burman Nieves M.D.   On: 04/30/2017 00:23   Mr 3d Recon At Scanner  Result Date: 05/01/2017 CLINICAL DATA:  Substernal chest pain after breakfast. Epigastric pain. EXAM: MRI ABDOMEN WITHOUT AND WITH CONTRAST (INCLUDING MRCP) TECHNIQUE: Multiplanar multisequence MR imaging of the abdomen was performed both before and after the administration of intravenous contrast. Heavily T2-weighted  images of the biliary and pancreatic ducts were obtained, and three-dimensional MRCP images were rendered by post processing. CONTRAST:  20mL MULTIHANCE GADOBENATE DIMEGLUMINE 529 MG/ML IV SOLN COMPARISON:  CT abdomen pelvis 04/29/2017 FINDINGS: Lower chest:  Lung bases are clear. Hepatobiliary: No focal hepatic lesion. No biliary duct dilatation. Gallbladder is normal. No gallstones evident. The common bile duct normal caliber. Mild loss of signal intensity on the opposed phase imaging consistent mild hepatic steatosis. Postcontrast imaging demonstrates no enhancing hepatic lesion. Pancreas: Normal pancreatic parenchymal intensity. No ductal dilatation or inflammation. Spleen: Normal spleen. Adrenals/urinary tract: Adrenal glands and kidneys are normal. Stomach/Bowel: Stomach, duodenum and limited view of the bowel is unremarkable. Vascular/Lymphatic: Abdominal aortic normal caliber. No retroperitoneal periportal lymphadenopathy. Musculoskeletal: No aggressive osseous lesion IMPRESSION: 1. No explanation for abdominal pain. 2. Mild hepatic steatosis. 3. Normal biliary tree and gallbladder. 4. No pancreatic abnormality. Electronically Signed   By: Genevive Bi M.D.   On: 05/01/2017 07:39   Dg Chest Portable 1 View  Result Date: 04/29/2017 CLINICAL DATA:  Chest pain. EXAM: PORTABLE CHEST 1 VIEW COMPARISON:  None.  FINDINGS: The heart size and mediastinal contours are within normal limits. Both lungs are clear. No pneumothorax or pleural effusion is noted. The visualized skeletal structures are unremarkable. IMPRESSION: No acute cardiopulmonary abnormality seen. Electronically Signed   By: Lupita Raider, M.D.   On: 04/29/2017 15:42   Mr Abdomen Mrcp Vivien Rossetti Contast  Result Date: 05/01/2017 CLINICAL DATA:  Substernal chest pain after breakfast. Epigastric pain. EXAM: MRI ABDOMEN WITHOUT AND WITH CONTRAST (INCLUDING MRCP) TECHNIQUE: Multiplanar multisequence MR imaging of the abdomen was performed both before and after the administration of intravenous contrast. Heavily T2-weighted images of the biliary and pancreatic ducts were obtained, and three-dimensional MRCP images were rendered by post processing. CONTRAST:  20mL MULTIHANCE GADOBENATE DIMEGLUMINE 529 MG/ML IV SOLN COMPARISON:  CT abdomen pelvis 04/29/2017 FINDINGS: Lower chest:  Lung bases are clear. Hepatobiliary: No focal hepatic lesion. No biliary duct dilatation. Gallbladder is normal. No gallstones evident. The common bile duct normal caliber. Mild loss of signal intensity on the opposed phase imaging consistent mild hepatic steatosis. Postcontrast imaging demonstrates no enhancing hepatic lesion. Pancreas: Normal pancreatic parenchymal intensity. No ductal dilatation or inflammation. Spleen: Normal spleen. Adrenals/urinary tract: Adrenal glands and kidneys are normal. Stomach/Bowel: Stomach, duodenum and limited view of the bowel is unremarkable. Vascular/Lymphatic: Abdominal aortic normal caliber. No retroperitoneal periportal lymphadenopathy. Musculoskeletal: No aggressive osseous lesion IMPRESSION: 1. No explanation for abdominal pain. 2. Mild hepatic steatosis. 3. Normal biliary tree and gallbladder. 4. No pancreatic abnormality. Electronically Signed   By: Genevive Bi M.D.   On: 05/01/2017 07:39   US Abdomen Limited Ruq  Result Date:  04/30/2017 CLINICAL DATA:  Elevated LFTs. EXAM: ULTRASOUND ABDOMEN LIMITED RIGHT UPPER QUADRANT COMPARISON:  Abdominal CT 04/29/2017 FINDINGS: Gallbladder: Gallbladder is decompressed but there is wall thickening measuring up to 0.5 cm. No gallstones or sludge. Patient does not have a sonographic Murphy sign. Common bile duct: Diameter: 0.4 cm Liver: Increased echogenicity of the liver parenchyma. No intrahepatic biliary dilatation. No suspicious liver lesions. Portal vein is patent on color Doppler imaging with normal direction of blood flow towards the liver. IMPRESSION: Gallbladder wall is mildly thickened but no gallbladder distension and no gallstones. Findings are nonspecific with regards to gallbladder inflammation. Slightly increased echogenicity in the liver parenchyma. There may be a component of hepatic steatosis. Electronically Signed   By: Richarda Overlie M.D.   On: 04/30/2017 10:11  IMPRESSION:   *  Acute, now resolved, upper abdominal/right upper quadrant pain with radiation to the scapula and into the chest. Ruled out for PE and MI. LFTs are elevated.  CT, ultrasound, MRCP performed and revealed gallbladder wall thickening without stones or sludge as well as mild hepatic steatosis. Clinically this sounds very much like biliary colic possibly he passed a stone.  *  Remote history of ulcer disease. GERD. Takes pantoprazole daily. Recently has had some use of ibuprofen but not enlarged doses, also takes aspirin for pain relief a couple of times a week.  Suspicion for ulcer disease is not strong.    PLAN:     *  Spoke with surgical PA and surgery will take a look at the patient in regards to question of symptomatic gallbladder disease. At this point no plans for upper endoscopy.   Jennye Moccasin  05/01/2017, 11:19 AM Pager: [phone]      Attending physician's note   I have taken a history, examined the patient and reviewed the chart. I agree with the Advanced Practitioner's note,  impression and recommendations. Acute symptoms were typical for biliary pain. Imaging studies and LFT elevation strongly suggestive of gallbladder disease, suspected microlithiasis in GB and possibly CBD. No additional GI evaluation planned at this time. GI follow up with his gastroenterologist in [facility]. Surgical consult to consider cholecystectomy with [facility].   Claudette Head, MD [facility] [phone] Mon-Fri 8a-5p [phone] after 5p, weekends, holidays

## 2017-05-05 LAB — CULTURE, BLOOD (ROUTINE X 2)
CULTURE: NO GROWTH
Culture: NO GROWTH
SPECIAL REQUESTS: ADEQUATE
SPECIAL REQUESTS: ADEQUATE

## 2018-04-03 ENCOUNTER — Ambulatory Visit (INDEPENDENT_AMBULATORY_CARE_PROVIDER_SITE_OTHER): Payer: Commercial Managed Care - POS | Admitting: Physician Assistant

## 2018-04-03 ENCOUNTER — Encounter (INDEPENDENT_AMBULATORY_CARE_PROVIDER_SITE_OTHER): Payer: Self-pay | Admitting: Physician Assistant

## 2018-04-03 VITALS — BP 126/86 | HR 88 | Temp 98.0°F | Resp 17 | Ht 69.0 in | Wt 197.0 lb

## 2018-04-03 DIAGNOSIS — J0110 Acute frontal sinusitis, unspecified: Secondary | ICD-10-CM

## 2018-04-03 MED ORDER — DOXYCYCLINE HYCLATE 100 MG PO TABS
100.00 mg | ORAL_TABLET | Freq: Two times a day (BID) | ORAL | 0 refills | Status: AC
Start: 2018-04-03 — End: 2018-04-13

## 2018-04-03 MED ORDER — AZELASTINE HCL 0.1 % NA SOLN
1.00 | Freq: Two times a day (BID) | NASAL | 0 refills | Status: AC
Start: 2018-04-03 — End: ?

## 2018-04-03 NOTE — Progress Notes (Signed)
Subjective:    Patient ID: Troy Owen is a 57 y.o. male.    Sinus Problem   This is a new problem. The current episode started 1 to 4 weeks ago. The problem is unchanged. There has been no fever. His pain is at a severity of 8/10. Associated symptoms include congestion, coughing, headaches, sinus pressure and a sore throat. Pertinent negatives include no chills, shortness of breath or sneezing.       The following portions of the patient's history were reviewed and updated as appropriate: allergies, current medications, past family history, past medical history, past social history, past surgical history and problem list.    Review of Systems   Constitutional: Positive for fatigue. Negative for chills and fever.   HENT: Positive for congestion, ear discharge, postnasal drip, sinus pain, sinus pressure and sore throat. Negative for sneezing.    Eyes: Negative for discharge and redness.   Respiratory: Positive for cough. Negative for shortness of breath and wheezing.    Cardiovascular: Negative for chest pain and palpitations.   Gastrointestinal: Negative for diarrhea, nausea and vomiting.   Genitourinary: Negative for dysuria and frequency.   Musculoskeletal: Positive for myalgias.   Skin: Negative for rash and wound.   Neurological: Positive for headaches.         Objective:    BP 126/86   Pulse 88   Temp 98 F (36.7 C)   Resp 17   Ht 1.753 m (5\' 9" )   Wt 89.4 kg (197 lb)   BMI 29.09 kg/m     Physical Exam   Constitutional: Vital signs are normal. He appears well-developed and well-nourished. No distress.   HENT:   Head: Normocephalic and atraumatic.   Right Ear: Tympanic membrane and ear canal normal.   Left Ear: Tympanic membrane and ear canal normal.   Nose: Right sinus exhibits frontal sinus tenderness. Left sinus exhibits frontal sinus tenderness.   Mouth/Throat: Uvula is midline and oropharynx is clear and moist. Tonsils are 0 on the right. Tonsils are 0 on the left.   Eyes: Conjunctivae are  normal.   Cardiovascular: Normal rate, regular rhythm and normal heart sounds.    Pulmonary/Chest: Effort normal and breath sounds normal.   Skin: He is not diaphoretic.   Psychiatric: He has a normal mood and affect. Thought content normal.   Nursing note and vitals reviewed.         Assessment and Plan:       Brison was seen today for sinus problem.    Diagnoses and all orders for this visit:    Acute frontal sinusitis, recurrence not specified    Other orders  -     doxycycline (VIBRA-TABS) 100 MG tablet; Take 1 tablet (100 mg total) by mouth 2 (two) times daily for 10 days  -     azelastine (ASTELIN) 0.1 % nasal spray; 1 spray by Nasal route 2 (two) times daily Use in each nostril as directed    Given 1+ week of symptoms started antibiotic course and instructed to complete  Tylenol and motrin for pain and headaches  Warm compress to sinuses.  For worsening condition RTC.          Ferman Hamming, PA  [facility]  04/03/2018  5:43 PM

## 2018-04-03 NOTE — Patient Instructions (Signed)
Sinusitis (Antibiotic Treatment)    The sinuses are air-filled spaces within the bones of the face. They connect to the inside of the nose.Sinusitisis an inflammation of the tissue that lines the sinuses. Sinusitis can occur during a cold. It can also happen due to allergies to pollens and other particles in the air. Sinusitis can cause symptoms of sinus congestion and a feeling of fullness. A sinus infection causes fever, headache, and facial pain. There is often green or yellow fluid draining from the nose or into the back of the throat (post-nasal drip). You have been given antibiotics to treat this condition.  Home care   Take the full course of antibiotics as instructed. Do not stop taking them, even when you feel better.   Drink plenty of water, hot tea, and other liquids. This may help thin nasal mucus. It also may help your sinuses drain fluids.   Heat may help soothe painful areas of your face. Use a towel soaked in hot water. Or, stand in the shower and direct the warm spray onto your face. Using a vaporizer along with a menthol rub at night may also help soothe symptoms.   Anexpectorantwith guaifenesin may help thin nasal mucus and help your sinuses drain fluids.   You can use an over-the-counterdecongestant,unless a similar medicine was prescribed to you. Nasal sprays work the fastest. Use one that contains phenylephrine or oxymetazoline. First blow your nose gently. Then use the spray. Do not use these medicines more often than directed on the label. If you do, your symptoms may get worse. You may also take pills that contain pseudoephedrine. Don't use products that combine multiple medicines. This is because side effects may be increased. Read labels. You can also ask the pharmacist for help. (People with high blood pressure should not use decongestants. They can raise blood pressure.)   Over-the-counterantihistaminesmay help if allergies contributed to your sinusitis.    Do not use  nasal rinses or irrigation during an acute sinus infection, unless your healthcare provider tells you to. Rinsing may spread the infection to other areas in your sinuses.   Use acetaminophen or ibuprofen to control pain, unless another pain medicine was prescribed to you. If you have chronic liver or kidney disease or ever had a stomach ulcer, talk with your healthcare provider before using these medicines. (Aspirin should never be taken by anyone under age 18 who is ill with a fever. It may cause severe liver damage.)   Don't smoke. This can make symptoms worse.  Follow-up care  Follow up with your healthcare provider or our staff if you are not better in 1 week.  When to seek medical advice  Call your healthcare provider if any of these occur:   Facial pain or headache that gets worse   Stiff neck   Unusual drowsiness or confusion   Swelling of your forehead or eyelids   Vision problems, such as blurred or double vision   Fever of100.4F (38C)or higher, or as directed by your healthcare provider   Seizure   Breathing problems   Symptoms don't go away in 10 days  Prevention  Here are steps you can take to help prevent an infection:   Keep good hand washing habits.   Don't have close contact with people who have sore throats, colds, or other upper respiratory infections.   Don't smoke, and stay away from secondhand smoke.   Stay up to date with of your vaccines.  Date Last Reviewed: 05/31/2016     2000-2019 [employer] [address], [place] [address]. All rights reserved. This information is not intended as a substitute for professional medical care. Always follow your healthcare professional's instructions.

## 2018-09-23 ENCOUNTER — Encounter (HOSPITAL_COMMUNITY): Payer: Self-pay | Admitting: Psychiatry

## 2018-09-23 ENCOUNTER — Ambulatory Visit (INDEPENDENT_AMBULATORY_CARE_PROVIDER_SITE_OTHER): Payer: 59 | Admitting: Psychiatry

## 2018-09-23 VITALS — BP 126/72 | Ht 69.0 in | Wt 197.0 lb

## 2018-09-23 DIAGNOSIS — F3130 Bipolar disorder, current episode depressed, mild or moderate severity, unspecified: Secondary | ICD-10-CM | POA: Diagnosis not present

## 2018-09-23 DIAGNOSIS — F331 Major depressive disorder, recurrent, moderate: Secondary | ICD-10-CM

## 2018-09-23 MED ORDER — LAMOTRIGINE 150 MG PO TABS: 150.0000 mg | ORAL_TABLET | Freq: Every day | ORAL | 1 refills | Status: DC
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
## 2018-10-07 ENCOUNTER — Other Ambulatory Visit (HOSPITAL_COMMUNITY): Payer: Self-pay | Admitting: Psychiatry

## 2018-10-07 DIAGNOSIS — F331 Major depressive disorder, recurrent, moderate: Secondary | ICD-10-CM

## 2018-10-07 DIAGNOSIS — F3130 Bipolar disorder, current episode depressed, mild or moderate severity, unspecified: Secondary | ICD-10-CM

## 2018-10-11 ENCOUNTER — Observation Stay (HOSPITAL_COMMUNITY): Payer: Managed Care, Other (non HMO) | Admitting: Certified Registered"

## 2018-10-11 ENCOUNTER — Observation Stay (HOSPITAL_COMMUNITY): Payer: Managed Care, Other (non HMO)

## 2018-10-11 ENCOUNTER — Ambulatory Visit (HOSPITAL_BASED_OUTPATIENT_CLINIC_OR_DEPARTMENT_OTHER)
Admission: EM | Admit: 2018-10-11 | Discharge: 2018-10-12 | Disposition: A | Discharge status: Home / Self Care | Payer: Managed Care, Other (non HMO) | Attending: General Surgery | Admitting: General Surgery

## 2018-10-11 ENCOUNTER — Other Ambulatory Visit: Payer: Self-pay

## 2018-10-11 ENCOUNTER — Encounter (HOSPITAL_BASED_OUTPATIENT_CLINIC_OR_DEPARTMENT_OTHER): Payer: Self-pay | Admitting: Emergency Medicine

## 2018-10-11 ENCOUNTER — Emergency Department (HOSPITAL_BASED_OUTPATIENT_CLINIC_OR_DEPARTMENT_OTHER): Payer: Managed Care, Other (non HMO)

## 2018-10-11 ENCOUNTER — Encounter (HOSPITAL_COMMUNITY)
Admission: EM | Disposition: A | Discharge status: Home / Self Care | Payer: Self-pay | Source: Home / Self Care | Attending: Emergency Medicine

## 2018-10-11 DIAGNOSIS — K219 Gastro-esophageal reflux disease without esophagitis: Secondary | ICD-10-CM | POA: Diagnosis not present

## 2018-10-11 DIAGNOSIS — K8010 Calculus of gallbladder with chronic cholecystitis without obstruction: Secondary | ICD-10-CM | POA: Insufficient documentation

## 2018-10-11 DIAGNOSIS — R109 Unspecified abdominal pain: Secondary | ICD-10-CM | POA: Diagnosis present

## 2018-10-11 DIAGNOSIS — Z01818 Encounter for other preprocedural examination: Secondary | ICD-10-CM

## 2018-10-11 DIAGNOSIS — F329 Major depressive disorder, single episode, unspecified: Secondary | ICD-10-CM | POA: Insufficient documentation

## 2018-10-11 DIAGNOSIS — Z79899 Other long term (current) drug therapy: Secondary | ICD-10-CM | POA: Diagnosis not present

## 2018-10-11 DIAGNOSIS — Z419 Encounter for procedure for purposes other than remedying health state, unspecified: Secondary | ICD-10-CM

## 2018-10-11 DIAGNOSIS — K8050 Calculus of bile duct without cholangitis or cholecystitis without obstruction: Secondary | ICD-10-CM | POA: Diagnosis present

## 2018-10-11 DIAGNOSIS — K8020 Calculus of gallbladder without cholecystitis without obstruction: Secondary | ICD-10-CM | POA: Diagnosis present

## 2018-10-11 DIAGNOSIS — F419 Anxiety disorder, unspecified: Secondary | ICD-10-CM | POA: Insufficient documentation

## 2018-10-11 HISTORY — PX: CHOLECYSTECTOMY: SHX55

## 2018-10-11 LAB — CBC
HCT: 45.2 % (ref 39.0–52.0)
HEMOGLOBIN: 14.5 g/dL (ref 13.0–17.0)
MCH: 28.8 pg (ref 26.0–34.0)
MCHC: 32.1 g/dL (ref 30.0–36.0)
MCV: 89.9 fL (ref 80.0–100.0)
Platelets: 232 10*3/uL (ref 150–400)
RBC: 5.03 MIL/uL (ref 4.22–5.81)
RDW: 12.1 % (ref 11.5–15.5)
WBC: 11.9 10*3/uL — AB (ref 4.0–10.5)
nRBC: 0 % (ref 0.0–0.2)

## 2018-10-11 LAB — COMPREHENSIVE METABOLIC PANEL
ALBUMIN: 4.3 g/dL (ref 3.5–5.0)
ALK PHOS: 88 U/L (ref 38–126)
ALT: 61 U/L — ABNORMAL HIGH (ref 0–44)
AST: 82 U/L — AB (ref 15–41)
Anion gap: 9 (ref 5–15)
BILIRUBIN TOTAL: 1.4 mg/dL — AB (ref 0.3–1.2)
BUN: 15 mg/dL (ref 6–20)
CO2: 25 mmol/L (ref 22–32)
Calcium: 9.5 mg/dL (ref 8.9–10.3)
Chloride: 103 mmol/L (ref 98–111)
Creatinine, Ser: 0.81 mg/dL (ref 0.61–1.24)
GFR calc Af Amer: >60 mL/min (ref >60)
GLUCOSE: 137 mg/dL — AB (ref 70–99)
Potassium: 3.8 mmol/L (ref 3.5–5.1)
Sodium: 137 mmol/L (ref 135–145)
TOTAL PROTEIN: 7.2 g/dL (ref 6.5–8.1)

## 2018-10-11 LAB — URINALYSIS, ROUTINE W REFLEX MICROSCOPIC
BACTERIA UA: NONE SEEN
BILIRUBIN URINE: NEGATIVE
GLUCOSE, UA: NEGATIVE mg/dL
KETONES UR: NEGATIVE mg/dL
Leukocytes,Ua: NEGATIVE
NITRITE: NEGATIVE
PH: 7 (ref 5.0–8.0)
Protein, ur: NEGATIVE mg/dL
SPECIFIC GRAVITY, URINE: 1.002 — AB (ref 1.005–1.030)

## 2018-10-11 LAB — LIPASE, BLOOD: Lipase: 34 U/L (ref 11–51)

## 2018-10-11 SURGERY — LAPAROSCOPIC CHOLECYSTECTOMY WITH INTRAOPERATIVE CHOLANGIOGRAM
Anesthesia: General | Site: Abdomen

## 2018-10-11 MED ORDER — SUGAMMADEX SODIUM 200 MG/2ML IV SOLN
INTRAVENOUS | Status: AC
  Filled 2018-10-11: qty 2

## 2018-10-11 MED ORDER — EPHEDRINE 5 MG/ML INJ
INTRAVENOUS | Status: AC
  Filled 2018-10-11: qty 10

## 2018-10-11 MED ORDER — ONDANSETRON HCL 4 MG/2ML IJ SOLN
INTRAMUSCULAR | Status: AC
  Filled 2018-10-11: qty 2

## 2018-10-11 MED ORDER — IOPAMIDOL (ISOVUE-300) INJECTION 61%
INTRAVENOUS | Status: DC | PRN
  Administered 2018-10-11: 6 mL

## 2018-10-11 MED ORDER — LIDOCAINE 2% (20 MG/ML) 5 ML SYRINGE
INTRAMUSCULAR | Status: AC
  Filled 2018-10-11: qty 5

## 2018-10-11 MED ORDER — SODIUM CHLORIDE 0.9 % IV SOLN
2.0000 g | INTRAVENOUS | Status: AC
  Administered 2018-10-11: 2 g via INTRAVENOUS
  Filled 2018-10-11: qty 20

## 2018-10-11 MED ORDER — SUGAMMADEX SODIUM 200 MG/2ML IV SOLN
INTRAVENOUS | Status: DC | PRN
  Administered 2018-10-11: 200 mg via INTRAVENOUS

## 2018-10-11 MED ORDER — PROPOFOL 10 MG/ML IV BOLUS
INTRAVENOUS | Status: AC
  Filled 2018-10-11: qty 20

## 2018-10-11 MED ORDER — BUPIVACAINE-EPINEPHRINE (PF) 0.25% -1:200000 IJ SOLN
INTRAMUSCULAR | Status: AC
  Filled 2018-10-11: qty 30

## 2018-10-11 MED ORDER — FENTANYL CITRATE (PF) 100 MCG/2ML IJ SOLN
25.0000 ug | Freq: Once | INTRAMUSCULAR | Status: AC
  Administered 2018-10-11: 25 ug via INTRAVENOUS
  Filled 2018-10-11: qty 2

## 2018-10-11 MED ORDER — TRAZODONE HCL 50 MG PO TABS
50.0000 mg | ORAL_TABLET | Freq: Every day | ORAL | Status: DC
  Administered 2018-10-11: 50 mg via ORAL
  Filled 2018-10-11: qty 1

## 2018-10-11 MED ORDER — PANTOPRAZOLE SODIUM 40 MG IV SOLR
40.0000 mg | Freq: Every day | INTRAVENOUS | Status: DC
  Administered 2018-10-11: 40 mg via INTRAVENOUS
  Filled 2018-10-11: qty 40

## 2018-10-11 MED ORDER — LIDOCAINE 2% (20 MG/ML) 5 ML SYRINGE
INTRAMUSCULAR | Status: DC | PRN
  Administered 2018-10-11: 80 mg via INTRAVENOUS

## 2018-10-11 MED ORDER — ONDANSETRON HCL 4 MG/2ML IJ SOLN
4.0000 mg | Freq: Once | INTRAMUSCULAR | Status: AC
  Administered 2018-10-11: 4 mg via INTRAVENOUS

## 2018-10-11 MED ORDER — PANTOPRAZOLE SODIUM 40 MG PO TBEC: 40.0000 mg | DELAYED_RELEASE_TABLET | Freq: Every day | ORAL | Status: DC

## 2018-10-11 MED ORDER — HYDROMORPHONE HCL 1 MG/ML IJ SOLN
INTRAMUSCULAR | Status: AC
  Filled 2018-10-11: qty 1

## 2018-10-11 MED ORDER — FENTANYL CITRATE (PF) 100 MCG/2ML IJ SOLN
50.0000 ug | Freq: Once | INTRAMUSCULAR | Status: AC
  Administered 2018-10-11: 50 ug via INTRAVENOUS
  Filled 2018-10-11: qty 2

## 2018-10-11 MED ORDER — FENTANYL CITRATE (PF) 100 MCG/2ML IJ SOLN
50.0000 ug | Freq: Once | INTRAMUSCULAR | Status: AC
  Administered 2018-10-11 (×2): 50 ug via INTRAVENOUS
  Administered 2018-10-11: 100 ug via INTRAVENOUS

## 2018-10-11 MED ORDER — ROCURONIUM BROMIDE 10 MG/ML (PF) SYRINGE
PREFILLED_SYRINGE | INTRAVENOUS | Status: DC | PRN
  Administered 2018-10-11: 30 mg via INTRAVENOUS

## 2018-10-11 MED ORDER — BUPIVACAINE HCL (PF) 0.5 % IJ SOLN
INTRAMUSCULAR | Status: DC | PRN
  Administered 2018-10-11: 27 mL

## 2018-10-11 MED ORDER — KCL IN DEXTROSE-NACL 20-5-0.9 MEQ/L-%-% IV SOLN
INTRAVENOUS | Status: DC
  Administered 2018-10-11: 19:00:00 via INTRAVENOUS
  Filled 2018-10-11 (×2): qty 1000

## 2018-10-11 MED ORDER — PROPOFOL 10 MG/ML IV BOLUS
INTRAVENOUS | Status: DC | PRN
  Administered 2018-10-11: 200 mg via INTRAVENOUS

## 2018-10-11 MED ORDER — KETAMINE HCL 10 MG/ML IJ SOLN
INTRAMUSCULAR | Status: DC | PRN
  Administered 2018-10-11: 35 mg via INTRAVENOUS

## 2018-10-11 MED ORDER — ESMOLOL HCL 100 MG/10ML IV SOLN
INTRAVENOUS | Status: DC | PRN
  Administered 2018-10-11 (×2): 20 mg via INTRAVENOUS

## 2018-10-11 MED ORDER — DEXAMETHASONE SODIUM PHOSPHATE 10 MG/ML IJ SOLN
INTRAMUSCULAR | Status: AC
  Filled 2018-10-11: qty 1

## 2018-10-11 MED ORDER — ONDANSETRON HCL 4 MG/2ML IJ SOLN: 4.0000 mg | Freq: Four times a day (QID) | INTRAMUSCULAR | Status: DC | PRN

## 2018-10-11 MED ORDER — ONDANSETRON 4 MG PO TBDP: 4.0000 mg | ORAL_TABLET | Freq: Once | ORAL | Status: DC

## 2018-10-11 MED ORDER — 0.9 % SODIUM CHLORIDE (POUR BTL) OPTIME
TOPICAL | Status: DC | PRN
  Administered 2018-10-11: 1000 mL

## 2018-10-11 MED ORDER — BUPIVACAINE HCL (PF) 0.5 % IJ SOLN
INTRAMUSCULAR | Status: AC
  Filled 2018-10-11: qty 30

## 2018-10-11 MED ORDER — ALUM & MAG HYDROXIDE-SIMETH 200-200-20 MG/5ML PO SUSP
30.0000 mL | Freq: Once | ORAL | Status: AC
  Administered 2018-10-11: 30 mL via ORAL
  Filled 2018-10-11: qty 30

## 2018-10-11 MED ORDER — ROCURONIUM BROMIDE 10 MG/ML (PF) SYRINGE
PREFILLED_SYRINGE | INTRAVENOUS | Status: AC
  Filled 2018-10-11: qty 10

## 2018-10-11 MED ORDER — BUPROPION HCL ER (XL) 300 MG PO TB24
300.0000 mg | ORAL_TABLET | Freq: Every day | ORAL | Status: DC
  Administered 2018-10-11 – 2018-10-12 (×2): 300 mg via ORAL
  Filled 2018-10-11 (×2): qty 1

## 2018-10-11 MED ORDER — KETAMINE HCL 10 MG/ML IJ SOLN
INTRAMUSCULAR | Status: AC
  Filled 2018-10-11: qty 1

## 2018-10-11 MED ORDER — MORPHINE SULFATE (PF) 2 MG/ML IV SOLN
1.0000 mg | INTRAVENOUS | Status: DC | PRN
  Administered 2018-10-11 – 2018-10-12 (×2): 2 mg via INTRAVENOUS
  Filled 2018-10-11 (×2): qty 1

## 2018-10-11 MED ORDER — ESMOLOL HCL 100 MG/10ML IV SOLN
INTRAVENOUS | Status: AC
  Filled 2018-10-11: qty 20

## 2018-10-11 MED ORDER — MIDAZOLAM HCL 2 MG/2ML IJ SOLN
INTRAMUSCULAR | Status: AC
  Filled 2018-10-11: qty 2

## 2018-10-11 MED ORDER — SUCCINYLCHOLINE CHLORIDE 200 MG/10ML IV SOSY
PREFILLED_SYRINGE | INTRAVENOUS | Status: DC | PRN
  Administered 2018-10-11: 140 mg via INTRAVENOUS

## 2018-10-11 MED ORDER — ONDANSETRON 4 MG PO TBDP
4.0000 mg | ORAL_TABLET | Freq: Four times a day (QID) | ORAL | Status: DC | PRN
  Administered 2018-10-11: 4 mg via ORAL
  Filled 2018-10-11: qty 1

## 2018-10-11 MED ORDER — MIDAZOLAM HCL 2 MG/2ML IJ SOLN
INTRAMUSCULAR | Status: DC | PRN
  Administered 2018-10-11: 2 mg via INTRAVENOUS

## 2018-10-11 MED ORDER — HYDROCODONE-ACETAMINOPHEN 5-325 MG PO TABS
1.0000 | ORAL_TABLET | ORAL | Status: DC | PRN
  Administered 2018-10-12: 2 via ORAL
  Filled 2018-10-11 (×2): qty 2

## 2018-10-11 MED ORDER — LACTATED RINGERS IR SOLN
Status: DC | PRN
  Administered 2018-10-11: 1000 mL

## 2018-10-11 MED ORDER — SODIUM CHLORIDE 0.9% FLUSH
3.0000 mL | Freq: Once | INTRAVENOUS | Status: DC
  Filled 2018-10-11: qty 3

## 2018-10-11 MED ORDER — PROMETHAZINE HCL 25 MG/ML IJ SOLN: 6.2500 mg | INTRAMUSCULAR | Status: DC | PRN

## 2018-10-11 MED ORDER — DEXAMETHASONE SODIUM PHOSPHATE 10 MG/ML IJ SOLN
INTRAMUSCULAR | Status: DC | PRN
  Administered 2018-10-11: 10 mg via INTRAVENOUS

## 2018-10-11 MED ORDER — HEPARIN SODIUM (PORCINE) 5000 UNIT/ML IJ SOLN
5000.0000 [IU] | Freq: Three times a day (TID) | INTRAMUSCULAR | Status: DC
  Administered 2018-10-12: 5000 [IU] via SUBCUTANEOUS
  Filled 2018-10-11: qty 1

## 2018-10-11 MED ORDER — KETOROLAC TROMETHAMINE 30 MG/ML IJ SOLN: 30.0000 mg | Freq: Once | INTRAMUSCULAR | Status: DC | PRN

## 2018-10-11 MED ORDER — LACTATED RINGERS IV SOLN
INTRAVENOUS | Status: DC | PRN
  Administered 2018-10-11: 14:00:00 via INTRAVENOUS

## 2018-10-11 MED ORDER — FENTANYL CITRATE (PF) 100 MCG/2ML IJ SOLN
INTRAMUSCULAR | Status: AC
  Filled 2018-10-11: qty 2

## 2018-10-11 MED ORDER — ACETAMINOPHEN 500 MG PO TABS
1000.0000 mg | ORAL_TABLET | ORAL | Status: AC
  Administered 2018-10-11: 1000 mg via ORAL
  Filled 2018-10-11: qty 2

## 2018-10-11 MED ORDER — SUCCINYLCHOLINE CHLORIDE 200 MG/10ML IV SOSY
PREFILLED_SYRINGE | INTRAVENOUS | Status: AC
  Filled 2018-10-11: qty 30

## 2018-10-11 MED ORDER — HYDROMORPHONE HCL 1 MG/ML IJ SOLN
0.2500 mg | INTRAMUSCULAR | Status: DC | PRN
  Administered 2018-10-11 (×3): 0.5 mg via INTRAVENOUS

## 2018-10-11 MED ORDER — IOPAMIDOL (ISOVUE-300) INJECTION 61%
INTRAVENOUS | Status: AC
  Filled 2018-10-11: qty 50

## 2018-10-11 MED ORDER — ONDANSETRON HCL 4 MG/2ML IJ SOLN
INTRAMUSCULAR | Status: DC | PRN
  Administered 2018-10-11: 4 mg via INTRAVENOUS

## 2018-10-11 MED ORDER — LAMOTRIGINE 25 MG PO TABS
150.0000 mg | ORAL_TABLET | Freq: Every day | ORAL | Status: DC
  Administered 2018-10-11 – 2018-10-12 (×2): 150 mg via ORAL
  Filled 2018-10-11 (×2): qty 1

## 2018-10-11 SURGICAL SUPPLY — 36 items
APPLIER CLIP 5 13 M/L LIGAMAX5 (MISCELLANEOUS) ×3
CABLE HIGH FREQUENCY MONO STRZ (ELECTRODE) ×3 IMPLANT
CATH REDDICK CHOLANGI 4FR 50CM (CATHETERS) ×3 IMPLANT
CHLORAPREP W/TINT 26ML (MISCELLANEOUS) ×3 IMPLANT
CLIP APPLIE 5 13 M/L LIGAMAX5 (MISCELLANEOUS) ×1 IMPLANT
COVER MAYO STAND STRL (DRAPES) ×3 IMPLANT
COVER WAND RF STERILE (DRAPES) IMPLANT
DECANTER SPIKE VIAL GLASS SM (MISCELLANEOUS) ×3 IMPLANT
DERMABOND ADVANCED (GAUZE/BANDAGES/DRESSINGS) ×2
DERMABOND ADVANCED .7 DNX12 (GAUZE/BANDAGES/DRESSINGS) ×1 IMPLANT
DRAPE C-ARM 42X120 X-RAY (DRAPES) ×3 IMPLANT
ELECT REM PT RETURN 15FT ADLT (MISCELLANEOUS) ×3 IMPLANT
GLOVE BIO SURGEON STRL SZ7.5 (GLOVE) ×3 IMPLANT
GLOVE BIOGEL PI IND STRL 6.5 (GLOVE) ×1 IMPLANT
GLOVE BIOGEL PI IND STRL 7.0 (GLOVE) ×1 IMPLANT
GLOVE BIOGEL PI INDICATOR 6.5 (GLOVE) ×2
GLOVE BIOGEL PI INDICATOR 7.0 (GLOVE) ×2
GLOVE ECLIPSE 6.5 STRL STRAW (GLOVE) ×3 IMPLANT
GLOVE SURG SS PI 6.5 STRL IVOR (GLOVE) ×3 IMPLANT
GLOVE SURG SS PI 7.0 STRL IVOR (GLOVE) ×3 IMPLANT
GOWN STRL REUS W/TWL XL LVL3 (GOWN DISPOSABLE) ×9 IMPLANT
HEMOSTAT SURGICEL 4X8 (HEMOSTASIS) IMPLANT
IV CATH 14GX2 1/4 (CATHETERS) ×3 IMPLANT
KIT BASIN OR (CUSTOM PROCEDURE TRAY) ×3 IMPLANT
KIT TURNOVER KIT A (KITS) ×3 IMPLANT
POUCH SPECIMEN RETRIEVAL 10MM (ENDOMECHANICALS) ×3 IMPLANT
SCISSORS LAP 5X35 DISP (ENDOMECHANICALS) ×3 IMPLANT
SET IRRIG TUBING LAPAROSCOPIC (IRRIGATION / IRRIGATOR) ×3 IMPLANT
SET TUBE SMOKE EVAC HIGH FLOW (TUBING) ×3 IMPLANT
SLEEVE XCEL OPT CAN 5 100 (ENDOMECHANICALS) ×6 IMPLANT
SUT MNCRL AB 4-0 PS2 18 (SUTURE) ×3 IMPLANT
TOWEL OR 17X26 10 PK STRL BLUE (TOWEL DISPOSABLE) ×3 IMPLANT
TOWEL OR NON WOVEN STRL DISP B (DISPOSABLE) ×3 IMPLANT
TRAY LAPAROSCOPIC (CUSTOM PROCEDURE TRAY) ×3 IMPLANT
TROCAR BLADELESS OPT 5 100 (ENDOMECHANICALS) ×3 IMPLANT
TROCAR XCEL BLUNT TIP 100MML (ENDOMECHANICALS) ×3 IMPLANT

## 2018-10-11 NOTE — ED Notes (Signed)
Pt is transferred to [facility] ED by POV, per DR [facility] permission . Significant other is driving . Pt stable for transfer.  Darl Pikes , charge nurse at [facility] made aware .

## 2018-10-11 NOTE — ED Provider Notes (Signed)
[facility] EMERGENCY DEPARTMENT Provider Note   CSN: [id number] Arrival date & time: 10/11/18  3086    History   Chief Complaint Chief Complaint  Patient presents with  . Abdominal Pain    HPI Douglas Hester is a 58 y.o. male with history of GERD, cystitis, MDD, and Bipolar 1 on Lamictal presenting with nausea, vomiting and abdominal pain that radiates to his back.  Patient was in his usual state of health until 4:00AM this morning when he developed epigastric pain that radiated to his back.  He endorses 9 out of 10 discomfort.  He had 4-5 episodes of NBNB emesis. No diarrhea.  He last ate a steak and she sandwich at 7 PM yesterday evening and had multiple cheese sticks overnight.  He has felt subjective fevers.  No rhinorrhea or congestion.  No chest pressure.  No urinary urgency or frequency.  No one else at home has similar problems.    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  He came from [place].     HPI  Past Medical History:  Diagnosis Date  . Anxiety   . Chronic GERD     Patient Active Problem List   Diagnosis Date Noted  . Hypokalemia 05/01/2017  . Elevated LFTs 05/01/2017  . Elevated lipase 05/01/2017  . Chest pain 04/29/2017  . Fever, unspecified 04/29/2017  . Tachycardia 04/29/2017  . Hyperglycemia 04/29/2017    Past Surgical History:  Procedure Laterality Date  . COLONOSCOPY  ~ 2014   done by GI in [facility]. Polyps were removed. Patient not aware what type of polyps these were  . ESOPHAGOGASTRODUODENOSCOPY  early 1990s   MD found ulcer  . INGUINAL HERNIA REPAIR Left ~ 2010   with Mesh.   Marland Kitchen VASECTOMY         Home Medications    Prior to Admission medications   Medication Sig Start Date End Date Taking? Authorizing Provider  buPROPion (WELLBUTRIN XL) 300 MG 24 hr tablet Take 300 mg by mouth daily. 02/19/17   [provider]  cycloSPORINE (RESTASIS) 0.05 % ophthalmic emulsion Place 2  drops into both eyes 2 (two) times daily. 10/13/15   [provider]  lamoTRIgine (LAMICTAL) 150 MG tablet Take 1 tablet (150 mg total) by mouth daily. 09/23/18 04/26/20  Arfeen, Phillips Grout, MD  pantoprazole (PROTONIX) 40 MG tablet Take 40 mg by mouth daily. 02/19/17 09/23/18  [provider]    Family History Family History  Problem Relation Age of Onset  . Lung cancer Mother   . Lymphoma Father   . Valvular heart disease Sister     Social History Social History   Tobacco Use  . Smoking status: Never Hester  . Smokeless tobacco: Never Used  Substance Use Topics  . Alcohol use: Yes    Comment: rare  . Drug use: No     Allergies   Patient has no known allergies.   Review of Systems Review of Systems  Constitutional: Negative for activity change, appetite change, chills and diaphoresis.  HENT: Negative for congestion, sore throat and trouble swallowing.   Gastrointestinal: Positive for abdominal pain, nausea and vomiting. Negative for abdominal distention, blood in stool, constipation and diarrhea.  Genitourinary: Negative for frequency and urgency.  Skin: Negative for rash.  Neurological: Negative for dizziness and headaches.     Physical Exam Updated Vital Signs BP (!) 154/89 (BP Location: Left Arm)   Pulse 72  Temp 97.7 F (36.5 C) (Oral)   Resp 18   Ht 5\' 9"  (1.753 m)   Wt 90.7 kg   SpO2 98%   BMI 29.53 kg/m   Physical Exam Constitutional:      General: He is not in acute distress.    Appearance: He is well-developed. He is not ill-appearing.  HENT:     Head: Normocephalic and atraumatic.     Mouth/Throat:     Mouth: Mucous membranes are moist.     Pharynx: No oropharyngeal exudate.     Comments: Clear reflux appreciated in posterior oropharynx Cardiovascular:     Rate and Rhythm: Normal rate and regular rhythm.     Heart sounds: Normal heart sounds. No murmur. No friction rub. No gallop.   Pulmonary:     Effort: Pulmonary effort is  normal. No respiratory distress.     Breath sounds: Normal breath sounds. No wheezing, rhonchi or rales.  Abdominal:     General: Abdomen is flat. Bowel sounds are normal. There is no distension.     Palpations: Abdomen is soft. There is no hepatomegaly or splenomegaly.     Tenderness: There is no right CVA tenderness, left CVA tenderness or guarding. Negative signs include Murphy's sign and McBurney's sign.  Neurological:     Mental Status: He is alert.    ED Treatments / Results  Labs (all labs ordered are listed, but only abnormal results are displayed) Labs Reviewed  COMPREHENSIVE METABOLIC PANEL - Abnormal; Notable for the following components:      Result Value   Glucose, Bld 137 (*)    AST 82 (*)    ALT 61 (*)    Total Bilirubin 1.4 (*)    All other components within normal limits  CBC - Abnormal; Notable for the following components:   WBC 11.9 (*)    All other components within normal limits  LIPASE, BLOOD  URINALYSIS, ROUTINE W REFLEX MICROSCOPIC    EKG EKG Interpretation  Date/Time:  Friday October 11 2018 08:02:51 EDT Ventricular Rate:  90 PR Interval:    QRS Duration: 153 QT Interval:  397 QTC Calculation: 486 R Axis:   -74 Text Interpretation:  Sinus rhythm RBBB and LAFB No significant change since last tracing Confirmed by Gwyneth Sprout ([id number]) on 10/11/2018 8:16:40 AM   Radiology US Abdomen Limited Ruq  Result Date: 10/11/2018 CLINICAL DATA:  58 year old male with abdominal pain, nausea and vomiting EXAM: ULTRASOUND ABDOMEN LIMITED RIGHT UPPER QUADRANT COMPARISON:  Prior MRI/MRCP 04/30/2017 FINDINGS: Gallbladder: A 1.4 cm echogenic structure with posterior acoustic shadowing is present in the gallbladder neck. The structure is immobile. The gallbladder is otherwise normal in appearance without distention, gallbladder wall thickening or pericholecystic fluid. Common bile duct: Diameter: Within normal limits at 6 mm Liver: No focal lesion identified. Within  normal limits in parenchymal echogenicity. Portal vein is patent on color Doppler imaging with normal direction of blood flow towards the liver. IMPRESSION: 1.4 cm stone lodged in the gallbladder neck may represent a source for biliary colic. No secondary sonographic findings to suggest acute cholecystitis at this time. Electronically Signed   By: Malachy Moan M.D.   On: 10/11/2018 09:24    Procedures Procedures (including critical care time)  Medications Ordered in ED Medications  sodium chloride flush (NS) 0.9 % injection 3 mL (3 mLs Intravenous Not Given 10/11/18 0750)  alum & mag hydroxide-simeth (MAALOX/MYLANTA) 200-200-20 MG/5ML suspension 30 mL (30 mLs Oral Given 10/11/18 0803)  ondansetron (ZOFRAN) injection 4  mg (4 mg Intravenous Given 10/11/18 0803)  fentaNYL (SUBLIMAZE) injection 50 mcg (50 mcg Intravenous Given 10/11/18 0816)  fentaNYL (SUBLIMAZE) injection 25 mcg (25 mcg Intravenous Given 10/11/18 0936)     Initial Impression / Assessment and Plan / ED Course  I have reviewed the triage vital signs and the nursing notes.  Pertinent labs & imaging results that were available during my care of the patient were reviewed by me and considered in my medical decision making (see chart for details).  Clinical Course as of Oct 10 1016  Fri Oct 11, 2018  0920 US Abdomen Limited RUQ [LF]    Clinical Course User Index [LF] Howard Pouch, MD     Biliary colic: Patient noted to have 4 cm obstructing stone lodged in the GB neck on RUQ U/S. Patient continues to be hemodynamically stable and afebrile, though continues to complain of pain despite fentanyl 50 mcg x2 given in ED. LFT's are elevated with AST 82, ALT 61. WBC mildly elevated at 11.9. Hgb stable at 14.5. Lipase normal at 34.   Surgery consulted for obstructing stone. Recommended transfer to [facility] ED for evaluation by surgery. Patient to be transferred to [facility] ED Dr. Patria Mane.  Final Clinical Impressions(s) / ED Diagnoses   Final  diagnoses:  Abdominal pain  Biliary colic    ED Discharge Orders    None       Howard Pouch, MD 10/11/18 1029    Gwyneth Sprout, MD 10/11/18 2055

## 2018-10-11 NOTE — Anesthesia Procedure Notes (Addendum)
Procedure Name: Intubation Date/Time: 10/11/2018 2:18 PM Performed by: Sindy Guadeloupe, CRNA Pre-anesthesia Checklist: Patient identified, Emergency Drugs available, Suction available, Patient being monitored and Timeout performed Patient Re-evaluated:Patient Re-evaluated prior to induction Oxygen Delivery Method: Circle system utilized Preoxygenation: Pre-oxygenation with 100% oxygen Induction Type: IV induction Ventilation: Mask ventilation without difficulty Laryngoscope Size: Miller and 2 Grade View: Grade I Tube type: Oral Tube size: 7.5 mm Number of attempts: 1 Airway Equipment and Method: Stylet Placement Confirmation: ETT inserted through vocal cords under direct vision,  positive ETCO2 and breath sounds checked- equal and bilateral Secured at: 21 cm Tube secured with: Tape Dental Injury: Teeth and Oropharynx as per pre-operative assessment

## 2018-10-11 NOTE — ED Provider Notes (Signed)
Still with some pain. General surgery to see patient. Stable at this time   Azalia Bilis, MD 10/11/18 1156

## 2018-10-11 NOTE — Op Note (Signed)
10/11/2018  3:22 PM  PATIENT:  Douglas Hester  58 y.o. male  PRE-OPERATIVE DIAGNOSIS:  cholelithiasis  POST-OPERATIVE DIAGNOSIS:  Cholelithiasis with cholecystitis  PROCEDURE:  Procedure(s): LAPAROSCOPIC CHOLECYSTECTOMY WITH INTRAOPERATIVE CHOLANGIOGRAM (N/A)  SURGEON:  Surgeon(s) and Role:    * Griselda Miner, MD - Primary  PHYSICIAN ASSISTANT:   ASSISTANTS: Barnetta Chapel, PA   ANESTHESIA:   local and general  EBL:  20 mL   BLOOD ADMINISTERED:none  DRAINS: none   LOCAL MEDICATIONS USED:  MARCAINE     SPECIMEN:  Source of Specimen:  gallbladder  DISPOSITION OF SPECIMEN:  PATHOLOGY  COUNTS:  YES  TOURNIQUET:  * No tourniquets in log *  DICTATION: .Dragon Dictation     Procedure: After informed consent was obtained the patient was brought to the operating room and placed in the supine position on the operating room table. After adequate induction of general anesthesia the patient's abdomen was prepped with ChloraPrep allowed to dry and draped in usual sterile manner. An appropriate timeout was performed. The area below the umbilicus was infiltrated with quarter percent  Marcaine. A small incision was made with a 15 blade knife. The incision was carried down through the subcutaneous tissue bluntly with a hemostat and [employer] retractors. The linea alba was identified. The linea alba was incised with a 15 blade knife and each side was grasped with Coker clamps. The preperitoneal space was then probed with a hemostat until the peritoneum was opened and access was gained to the abdominal cavity. A 0 Vicryl pursestring stitch was placed in the fascia surrounding the opening. A Hassan cannula was then placed through the opening and anchored in place with the previously placed Vicryl purse string stitch. The abdomen was insufflated with carbon dioxide without difficulty. A laparoscope was inserted through the [facility] cannula in the right upper quadrant was inspected. Next the  epigastric region was infiltrated with % Marcaine. A small incision was made with a 15 blade knife. A 5 mm port was placed bluntly through this incision into the abdominal cavity under direct vision. Next 2 sites were chosen laterally on the right side of the abdomen for placement of 5 mm ports. Each of these areas was infiltrated with quarter percent Marcaine. Small stab incisions were made with a 15 blade knife. 5 mm ports were then placed bluntly through these incisions into the abdominal cavity under direct vision without difficulty. A blunt grasper was placed through the lateralmost 5 mm port and used to grasp the dome of the gallbladder and elevated anteriorly and superiorly. Another blunt grasper was placed through the other 5 mm port and used to retract the body and neck of the gallbladder. A dissector was placed through the epigastric port and using the electrocautery the peritoneal reflection at the gallbladder neck was opened. Blunt dissection was then carried out in this area until the gallbladder neck-cystic duct junction was readily identified and a good window was created. A single clip was placed on the gallbladder neck. A small  ductotomy was made just below the clip with laparoscopic scissors. A 14-gauge Angiocath was then placed through the anterior abdominal wall under direct vision. A Reddick cholangiogram catheter was then placed through the Angiocath and flushed. The catheter was then placed in the cystic duct and anchored in place with a clip. A cholangiogram was obtained that showed no filling defects good emptying into the duodenum an adequate length on the cystic duct. The anchoring clip and catheters were then removed from the  patient. 3 clips were placed proximally on the cystic duct and the duct was divided between the 2 sets of clips. Posterior to this the cystic artery was identified and again dissected bluntly in a circumferential manner until a good window  was created. 2 clips  were placed proximally and one distally on the artery and the artery was divided between the 2 sets of clips. Next a laparoscopic hook cautery device was used to separate the gallbladder from the liver bed. Prior to completely detaching the gallbladder from the liver bed the liver bed was inspected and several small bleeding points were coagulated with the electrocautery until the area was completely hemostatic. The gallbladder was then detached the rest of it from the liver bed without difficulty. A laparoscopic bag was inserted through the hassan port. The laparoscope was moved to the epigastric port. The gallbladder was placed within the bag and the bag was sealed.  The bag with the gallbladder was then removed with the [facility] cannula through the infraumbilical port without difficulty. The fascial defect was then closed with the previously placed Vicryl pursestring stitch as well as with another figure-of-eight 0 Vicryl stitch. The liver bed was inspected again and found to be hemostatic. The abdomen was irrigated with copious amounts of saline until the effluent was clear. The ports were then removed under direct vision without difficulty and were found to be hemostatic. The gas was allowed to escape. The skin incisions were all closed with interrupted 4-0 Monocryl subcuticular stitches. Dermabond dressings were applied. The patient tolerated the procedure well. At the end of the case all needle sponge and instrument counts were correct. The patient was then awakened and taken to recovery in stable condition  PLAN OF CARE: Admit for overnight observation  PATIENT DISPOSITION:  PACU - hemodynamically stable.   Delay start of Pharmacological VTE agent (>24hrs) due to surgical blood loss or risk of bleeding: no

## 2018-10-11 NOTE — H&P (Signed)
Douglas Hester [birth year]  [id number].    Chief Complaint/Reason for Consult: Biliary colic  HPI:  This is a 58 year old white male with a history of GERD and anxiety/depression who also has a history of peptic ulcer disease as well.  About a year ago he presented to [facility] emergency department with epigastric abdominal pain.  He had an EKG that revealed a right bundle branch block and a cardiac work-up was completed.  This was negative.  At that time he was found to have gallstones upon further evaluation.  Given his pain had resolved at that point after being evaluated by the general surgery team, he decided not to proceed with an operation given this was his first attack.  Since that time he has had 2 other minor attacks that have resolved within a couple of hours of starting.  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  He then developed nausea and projectile vomiting.  He was driving back from [place] on his route.  He had had a [employer] steak and cheese earlier in the night and a [employer] and to string cheese sticks about 30 minutes prior to this episode.  Nothing helped his pain.  Due to persistent emesis as well as pain he presented to med [facility] ER for further evaluation.  He had essentially normal labs except for a slight elevation of his white blood cell count at 11,900.  He had an abdominal ultrasound which revealed a 1.4 cm gallstone lodged in the neck of his gallbladder that was immobile.  No evidence of cholecystitis at this time.  The patient was transferred here to [facility] long hospital for further evaluation.  The patient states his pain is persisting as his pain medicine is wearing off however his nausea is improved with Zofran.  We have been asked to evaluate the patient for further recommendations.  ROS: ROS: Please see HPI otherwise all other systems have been reviewed and are  negative.  Of note the patient was unaware that his prior EKG in 2018 revealed a right bundle branch block.  His new EKG today continues to reveal a right bundle branch block.  This has never been further evaluated.  He denies any chest pain, shortness of breath, dysuria, arthralgia, etc.  Family History  Problem Relation Age of Onset  . Lung cancer Mother   . Lymphoma Father   . Valvular heart disease Sister     Past Medical History:  Diagnosis Date  . Anxiety   . Chronic GERD     Past Surgical History:  Procedure Laterality Date  . COLONOSCOPY  ~ 2014   done by GI in [facility]. Polyps were removed. Patient not aware what type of polyps these were  . ESOPHAGOGASTRODUODENOSCOPY  early 1990s   MD found ulcer  . INGUINAL HERNIA REPAIR Left ~ 2010   with Mesh.   Marland Kitchen VASECTOMY      Social History:  reports that he has never smoked. He has never used smokeless tobacco. He reports current alcohol use. He reports that he does not use drugs.  Allergies: No Known Allergies  Medications Prior to Admission  Medication Sig Dispense Refill  . Aspirin-Salicylamide-Caffeine (BC HEADACHE POWDER PO) Take 1 packet by mouth every evening.    Marland Kitchen buPROPion (WELLBUTRIN XL) 300 MG 24 hr tablet Take 300 mg by mouth daily.    . Calcium Carbonate Antacid (TUMS  PO) Take 4 tablets by mouth at bedtime as needed (heartburn).    . cycloSPORINE (RESTASIS) 0.05 % ophthalmic emulsion Place 2 drops into both eyes 2 (two) times daily.    Marland Kitchen ibuprofen (ADVIL,MOTRIN) 200 MG tablet Take 400 mg by mouth daily as needed for moderate pain.    Marland Kitchen lamoTRIgine (LAMICTAL) 150 MG tablet Take 1 tablet (150 mg total) by mouth daily. 30 tablet 1  . Melatonin 5 MG TABS Take 5 mg by mouth at bedtime as needed (sleep).    . pantoprazole (PROTONIX) 40 MG tablet Take 40 mg by mouth daily.    . traZODone (DESYREL) 50 MG tablet Take 50 mg by mouth at bedtime.       Physical Exam: Blood pressure (!) 149/89, pulse (!) 104,  temperature 97.6 F (36.4 C), temperature source Oral, resp. rate 18, height 5\' 9"  (1.753 m), weight 90.7 kg, SpO2 98 %. General: pleasant, WD, WN white male who is laying in bed in NAD HEENT: head is normocephalic, atraumatic.  Sclera are noninjected.  PERRL.  Ears and nose without any masses or lesions.  Mouth is pink and moist Heart: regular rhythm, but slightly tachycardic.  Normal s1,s2. No obvious murmurs, gallops, or rubs noted.  Palpable radial and pedal pulses bilaterally Lungs: CTAB, no wheezes, rhonchi, or rales noted.  Respiratory effort nonlabored Abd: soft, some epigastric and right upper quadrant tenderness.  No current Murphy sign.  He did have pain medicine about 2 hours ago. ND, +BS, no masses, hernias, or organomegaly MS: all 4 extremities are symmetrical with no cyanosis, clubbing, or edema. Skin: warm and dry with no masses, lesions, or rashes Psych: A&Ox3 with an appropriate affect.   Results for orders placed or performed during the hospital encounter of 10/11/18 (from the past 48 hour(s))  Lipase, blood     Status: None   Collection Time: 10/11/18  7:58 AM  Result Value Ref Range   Lipase 34 11 - 51 U/L    Comment: Performed at [facility], [address] [place], [place] [address]  Comprehensive metabolic panel     Status: Abnormal   Collection Time: 10/11/18  7:58 AM  Result Value Ref Range   Sodium 137 135 - 145 mmol/L   Potassium 3.8 3.5 - 5.1 mmol/L   Chloride 103 98 - 111 mmol/L   CO2 25 22 - 32 mmol/L   Glucose, Bld 137 (H) 70 - 99 mg/dL   BUN 15 6 - 20 mg/dL   Creatinine, Ser 4.17 0.61 - 1.24 mg/dL   Calcium 9.5 8.9 - 40.8 mg/dL   Total Protein 7.2 6.5 - 8.1 g/dL   Albumin 4.3 3.5 - 5.0 g/dL   AST 82 (H) 15 - 41 U/L   ALT 61 (H) 0 - 44 U/L   Alkaline Phosphatase 88 38 - 126 U/L   Total Bilirubin 1.4 (H) 0.3 - 1.2 mg/dL   GFR calc non Af Amer >60 >60 mL/min   GFR calc Af Amer >60 >60 mL/min   Anion gap 9 5 - 15    Comment: Performed at  [facility], [address] [place], [place] [address]  CBC     Status: Abnormal   Collection Time: 10/11/18  7:58 AM  Result Value Ref Range   WBC 11.9 (H) 4.0 - 10.5 K/uL   RBC 5.03 4.22 - 5.81 MIL/uL   Hemoglobin 14.5 13.0 - 17.0 g/dL   HCT 85.6 31.4 - 97.0 %  MCV 89.9 80.0 - 100.0 fL   MCH 28.8 26.0 - 34.0 pg   MCHC 32.1 30.0 - 36.0 g/dL   RDW 16.1 09.6 - 04.5 %   Platelets 232 150 - 400 K/uL   nRBC 0.0 0.0 - 0.2 %    Comment: Performed at [facility], [address] [place], [place] [address]  Urinalysis, Routine w reflex microscopic     Status: Abnormal   Collection Time: 10/11/18 11:31 AM  Result Value Ref Range   Color, Urine STRAW (A) YELLOW   APPearance CLEAR CLEAR   Specific Gravity, Urine 1.002 (L) 1.005 - 1.030   pH 7.0 5.0 - 8.0   Glucose, UA NEGATIVE NEGATIVE mg/dL   Hgb urine dipstick SMALL (A) NEGATIVE   Bilirubin Urine NEGATIVE NEGATIVE   Ketones, ur NEGATIVE NEGATIVE mg/dL   Protein, ur NEGATIVE NEGATIVE mg/dL   Nitrite NEGATIVE NEGATIVE   Leukocytes,Ua NEGATIVE NEGATIVE   RBC / HPF 0-5 0 - 5 RBC/hpf   WBC, UA 0-5 0 - 5 WBC/hpf   Bacteria, UA NONE SEEN NONE SEEN    Comment: Performed at [facility], [address] [place], [place] [address]   US Abdomen Limited Ruq  Result Date: 10/11/2018 CLINICAL DATA:  58 year old male with abdominal pain, nausea and vomiting EXAM: ULTRASOUND ABDOMEN LIMITED RIGHT UPPER QUADRANT COMPARISON:  Prior MRI/MRCP 04/30/2017 FINDINGS: Gallbladder: A 1.4 cm echogenic structure with posterior acoustic shadowing is present in the gallbladder neck. The structure is immobile. The gallbladder is otherwise normal in appearance without distention, gallbladder wall thickening or pericholecystic fluid. Common bile duct: Diameter: Within normal limits at 6 mm Liver: No focal lesion identified. Within normal limits in parenchymal echogenicity. Portal vein is patent on color Doppler imaging with  normal direction of blood flow towards the liver. IMPRESSION: 1.4 cm stone lodged in the gallbladder neck may represent a source for biliary colic. No secondary sonographic findings to suggest acute cholecystitis at this time. Electronically Signed   By: Malachy Moan M.D.   On: 10/11/2018 09:24      Assessment/Plan GERD -discussed the need to stop taking Goody's powders Anxiety/Depression -we will resume home meds postoperatively.  Biliary colic The patient appears to have a 1.4 cm gallstone lodged in the neck of his gallbladder which is currently immobile.  He does not have any evidence of acute cholecystitis but given persistent pain as well as immobility of the stone, we will proceed with surgical intervention for a laparoscopic cholecystectomy.  Of note, the patient does take Goody's powder.  His last dose was on Monday.  After further review this has a 500 mg of aspirin in each dose.  He is unsure if he is taken any more since then but does state that he takes these on a fairly regular basis but has cut down recently.  He does not take any other blood thinners that he knows of.  After further discussion about this with Dr. Carolynne Edouard, we will plan to proceed with laparoscopic cholecystectomy with intraoperative cholangiogram later today.  I discussed the procedure, risks and complications including but not limited to bleeding, infection, bile leak, postoperative abscess, common bile duct injury, inadvertent injury to surrounding structures, heart attack, stroke, or death.  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  He denies any chest pain or shortness of breath with exertion.  This appears to be stable from his EKG  in 2018.  We will also obtain a preoperative chest x-ray given his EKG findings as well as his age.  The patient understands all of this along with his wife who was present in the room, and agrees to proceed  with surgical intervention.   Letha Cape, [facility] 10/11/2018, 12:55 PM Pager: [phone]

## 2018-10-11 NOTE — ED Triage Notes (Signed)
Pt reports abd pain , Hx gallbladder issues, was admitted in past for gall stones. Also reports nausea and emesis started this  Morning. denies diarrhea .

## 2018-10-11 NOTE — Anesthesia Preprocedure Evaluation (Signed)
Anesthesia Evaluation  Patient identified by MRN, date of birth, ID band Patient awake    Reviewed: Allergy & Precautions, NPO status , Patient's Chart, lab work & pertinent test results  Airway Mallampati: II  TM Distance: >3 FB Neck ROM: Full    Dental no notable dental hx.    Pulmonary neg pulmonary ROS,    Pulmonary exam normal breath sounds clear to auscultation       Cardiovascular negative cardio ROS Normal cardiovascular exam Rhythm:Regular Rate:Normal     Neuro/Psych Anxiety negative neurological ROS     GI/Hepatic Neg liver ROS, GERD  ,  Endo/Other  negative endocrine ROS  Renal/GU negative Renal ROS  negative genitourinary   Musculoskeletal negative musculoskeletal ROS (+)   Abdominal   Peds negative pediatric ROS (+)  Hematology negative hematology ROS (+)   Anesthesia Other Findings   Reproductive/Obstetrics negative OB ROS                             Anesthesia Physical Anesthesia Plan  ASA: II  Anesthesia Plan: General   Post-op Pain Management:    Induction: Intravenous  PONV Risk Score and Plan: 2 and Ondansetron, Dexamethasone and Treatment may vary due to age or medical condition  Airway Management Planned: Oral ETT  Additional Equipment:   Intra-op Plan:   Post-operative Plan: Extubation in OR  Informed Consent: I have reviewed the patients History and Physical, chart, labs and discussed the procedure including the risks, benefits and alternatives for the proposed anesthesia with the patient or authorized representative who has indicated his/her understanding and acceptance.     Dental advisory given  Plan Discussed with: CRNA and Surgeon  Anesthesia Plan Comments:         Anesthesia Quick Evaluation

## 2018-10-11 NOTE — ED Notes (Signed)
Surgery PA at bedside.  

## 2018-10-11 NOTE — Transfer of Care (Signed)
Immediate Anesthesia Transfer of Care Note  Patient: Douglas Hester  Procedure(s) Performed: LAPAROSCOPIC CHOLECYSTECTOMY WITH INTRAOPERATIVE CHOLANGIOGRAM (N/A Abdomen)  Patient Location: PACU  Anesthesia Type:General  Level of Consciousness: awake, alert  and oriented  Airway & Oxygen Therapy: Patient Spontanous Breathing and Patient connected to face mask oxygen  Post-op Assessment: Report given to RN and Post -op Vital signs reviewed and stable  Post vital signs: Reviewed and stable  Last Vitals:  Vitals Value Taken Time  BP 140/86 10/11/2018  3:40 PM  Temp    Pulse 97 10/11/2018  3:42 PM  Resp 26 10/11/2018  3:42 PM  SpO2 100 % 10/11/2018  3:42 PM  Vitals shown include unvalidated device data.  Last Pain:  Vitals:   10/11/18 1248  TempSrc: Oral  PainSc:          Complications: No apparent anesthesia complications

## 2018-10-11 NOTE — Discharge Instructions (Signed)
[facility]  Please arrive at least 30 min before your appointment to complete your check in paperwork.  If you are unable to arrive 30 min prior to your appointment time we may have to cancel or reschedule you. LAPAROSCOPIC SURGERY: POST OP INSTRUCTIONS Always review your discharge instruction sheet given to you by the facility where your surgery was performed. IF YOU HAVE DISABILITY OR FAMILY LEAVE FORMS, YOU MUST BRING THEM TO THE OFFICE FOR PROCESSING.   DO NOT GIVE THEM TO YOUR DOCTOR.  PAIN CONTROL  1. First take acetaminophen (Tylenol) AND/or ibuprofen (Advil) to control your pain after surgery.  Follow directions on package.  Taking acetaminophen (Tylenol) and/or ibuprofen (Advil) regularly after surgery will help to control your pain and lower the amount of prescription pain medication you may need.  You should not take more than 4,000 mg (4 grams) of acetaminophen (Tylenol) in 24 hours.  You should not take ibuprofen (Advil), aleve, motrin, naprosyn or other NSAIDS if you have a history of stomach ulcers or chronic kidney disease.  2. A prescription for pain medication may be given to you upon discharge.  Take your pain medication as prescribed, if you still have uncontrolled pain after taking acetaminophen (Tylenol) or ibuprofen (Advil). 3. Use ice packs to help control pain. 4. If you need a refill on your pain medication, please contact your pharmacy.  They will contact our office to request authorization. Prescriptions will not be filled after 5pm or on week-ends.  HOME MEDICATIONS 5. Take your usually prescribed medications unless otherwise directed.  DIET 6. You should follow a light diet the first few days after arrival home.  Be sure to include lots of fluids daily. Avoid fatty, fried foods.   CONSTIPATION 7. It is common to experience some constipation after surgery and if you are taking pain medication.  Increasing fluid intake and taking a stool  softener (such as Colace) will usually help or prevent this problem from occurring.  A mild laxative (Milk of Magnesia or Miralax) should be taken according to package instructions if there are no bowel movements after 48 hours.  WOUND/INCISION CARE 8. Most patients will experience some swelling and bruising in the area of the incisions.  Ice packs will help.  Swelling and bruising can take several days to resolve.  9. Unless discharge instructions indicate otherwise, follow guidelines below  a. STERI-STRIPS - you may remove your outer bandages 48 hours after surgery, and you may shower at that time.  You have steri-strips (small skin tapes) in place directly over the incision.  These strips should be left on the skin for 7-10 days.   b. DERMABOND/SKIN GLUE - you may shower in 24 hours.  The glue will flake off over the next 2-3 weeks. 10. Any sutures or staples will be removed at the office during your follow-up visit.  ACTIVITIES 11. You may resume regular (light) daily activities beginning the next day--such as daily self-care, walking, climbing stairs--gradually increasing activities as tolerated.  You may have sexual intercourse when it is comfortable.  Refrain from any heavy lifting or straining until approved by your doctor. a. You may drive when you are no longer taking prescription pain medication, you can comfortably wear a seatbelt, and you can safely maneuver your car and apply brakes.  FOLLOW-UP 12. You should see your doctor in the office for a follow-up appointment approximately 2-3 weeks after your surgery.  You should have been given your post-op/follow-up appointment when   your surgery was scheduled.  If you did not receive a post-op/follow-up appointment, make sure that you call for this appointment within a day or two after you arrive home to insure a convenient appointment time.   WHEN TO CALL YOUR DOCTOR: 1. Fever over 101.0 2. Inability to urinate 3. Continued bleeding from  incision. 4. Increased pain, redness, or drainage from the incision. 5. Increasing abdominal pain  The clinic staff is available to answer your questions during regular business hours.  Please don't hesitate to call and ask to speak to one of the nurses for clinical concerns.  If you have a medical emergency, go to the nearest emergency room or call 911.  A surgeon from [facility] is always on call at the hospital. [address], [address], [place], [place]  [address] ? [address] [address], [place], [place]   [address] [phone] ? [phone] ? FAX [phone]  .........   Managing Your Pain After Surgery Without Opioids    Thank you for participating in our program to help patients manage their pain after surgery without opioids. This is part of our effort to provide you with the best care possible, without exposing you or your family to the risk that opioids pose.  What pain can I expect after surgery? You can expect to have some pain after surgery. This is normal. The pain is typically worse the day after surgery, and quickly begins to get better. Many studies have found that many patients are able to manage their pain after surgery with Over-the-Counter (OTC) medications such as Tylenol and Motrin. If you have a condition that does not allow you to take Tylenol or Motrin, notify your surgical team.  How will I manage my pain? The best strategy for controlling your pain after surgery is around the clock pain control with Tylenol (acetaminophen) and Motrin (ibuprofen or Advil). Alternating these medications with each other allows you to maximize your pain control. In addition to Tylenol and Motrin, you can use heating pads or ice packs on your incisions to help reduce your pain.  How will I alternate your regular strength over-the-counter pain medication? You will take a dose of pain medication every three hours. ; Start by taking 650 mg of Tylenol (2 pills of 325  mg) ; 3 hours later take 600 mg of Motrin (3 pills of 200 mg) ; 3 hours after taking the Motrin take 650 mg of Tylenol ; 3 hours after that take 600 mg of Motrin.   - 1 -  See example - if your first dose of Tylenol is at 12:00 PM   12:00 PM Tylenol 650 mg (2 pills of 325 mg)  3:00 PM Motrin 600 mg (3 pills of 200 mg)  6:00 PM Tylenol 650 mg (2 pills of 325 mg)  9:00 PM Motrin 600 mg (3 pills of 200 mg)  Continue alternating every 3 hours   We recommend that you follow this schedule around-the-clock for at least 3 days after surgery, or until you feel that it is no longer needed. Use the table on the last page of this handout to keep track of the medications you are taking. Important: Do not take more than 3000mg of Tylenol or 3200mg of Motrin in a 24-hour period. Do not take ibuprofen/Motrin if you have a history of bleeding stomach ulcers, severe kidney disease, &/or actively taking a blood thinner  What if I still have pain? If you have pain that is not   controlled with the over-the-counter pain medications (Tylenol and Motrin or Advil) you might have what we call "breakthrough" pain. You will receive a prescription for a small amount of an opioid pain medication such as Oxycodone, Tramadol, or Tylenol with Codeine. Use these opioid pills in the first 24 hours after surgery if you have breakthrough pain. Do not take more than 1 pill every 4-6 hours.  If you still have uncontrolled pain after using all opioid pills, don't hesitate to call our staff using the number provided. We will help make sure you are managing your pain in the best way possible, and if necessary, we can provide a prescription for additional pain medication.   Day 1    Time  Name of Medication Number of pills taken  Amount of Acetaminophen  Pain Level   Comments  AM PM       AM PM       AM PM       AM PM       AM PM       AM PM       AM PM       AM PM       Total Daily amount of Acetaminophen Do not  take more than  3,000 mg per day      Day 2    Time  Name of Medication Number of pills taken  Amount of Acetaminophen  Pain Level   Comments  AM PM       AM PM       AM PM       AM PM       AM PM       AM PM       AM PM       AM PM       Total Daily amount of Acetaminophen Do not take more than  3,000 mg per day      Day 3    Time  Name of Medication Number of pills taken  Amount of Acetaminophen  Pain Level   Comments  AM PM       AM PM       AM PM       AM PM          AM PM       AM PM       AM PM       AM PM       Total Daily amount of Acetaminophen Do not take more than  3,000 mg per day      Day 4    Time  Name of Medication Number of pills taken  Amount of Acetaminophen  Pain Level   Comments  AM PM       AM PM       AM PM       AM PM       AM PM       AM PM       AM PM       AM PM       Total Daily amount of Acetaminophen Do not take more than  3,000 mg per day      Day 5    Time  Name of Medication Number of pills taken  Amount of Acetaminophen  Pain Level   Comments  AM PM       AM PM       AM   PM       AM PM       AM PM       AM PM       AM PM       AM PM       Total Daily amount of Acetaminophen Do not take more than  3,000 mg per day       Day 6    Time  Name of Medication Number of pills taken  Amount of Acetaminophen  Pain Level  Comments  AM PM       AM PM       AM PM       AM PM       AM PM       AM PM       AM PM       AM PM       Total Daily amount of Acetaminophen Do not take more than  3,000 mg per day      Day 7    Time  Name of Medication Number of pills taken  Amount of Acetaminophen  Pain Level   Comments  AM PM       AM PM       AM PM       AM PM       AM PM       AM PM       AM PM       AM PM       Total Daily amount of Acetaminophen Do not take more than  3,000 mg per day        For additional information about how and where to safely dispose of unused  opioid medications - https://www.morepowerfulnc.org  Disclaimer: This document contains information and/or instructional materials adapted from [facility] for the typical patient with your condition. It does not replace medical advice from your health care provider because your experience may differ from that of the typical patient. Talk to your health care provider if you have any questions about this document, your condition or your treatment plan. Adapted from [facility]   

## 2018-10-12 MED ORDER — OXYCODONE-ACETAMINOPHEN 5-325 MG PO TABS: 1.0000 | ORAL_TABLET | Freq: Four times a day (QID) | ORAL | 0 refills | Status: DC | PRN

## 2018-10-12 NOTE — Progress Notes (Signed)
Reviewed discharge paperwork & teaching, medication regimen, follow up appointment, & where to pick up prescription. Patient escorted via wheelchair by NT.

## 2018-10-12 NOTE — Discharge Summary (Signed)
Physician Discharge Summary [facility]  Patient ID: Douglas Hester MRN: [id number] DOB/AGE: [id number] y.o.  Admit date: 10/11/2018 Discharge date: 10/12/2018  Admission Diagnoses:  Cholecystitis, cholelithiasis  Discharge Diagnoses:  Active Problems:   Biliary colic   Gallstones   Discharged Condition: good  Hospital Course: Patient was admitted for observation following gallbladder surgery.  Post op course was uncomplicated.  Pain was well controlled.  Tolerated diet.  Patient was prepared for discharge home on POD#1.  Consults: None  Treatments: surgery: lap chole with IOC  Discharge Exam: Blood pressure 129/84, pulse (!) 106, temperature 98.6 F (37 C), temperature source Oral, resp. rate 18, height 5\' 9"  (1.753 m), weight 90.7 kg, SpO2 90 %. HEENT - clear Neck - soft Chest - clear bilaterally Cor - RRR Abd - soft, wounds dry and intact with Dermabond  Disposition: Home  Discharge Instructions    Diet - low sodium heart healthy   Complete by:  As directed    Discharge instructions   Complete by:  As directed    [facility]  LAPAROSCOPIC SURGERY:  POST-OP INSTRUCTIONS  Always review your discharge instruction sheet given to you by the facility where your surgery was performed.  A prescription for pain medication may be given to you upon discharge.  Take your pain medication as prescribed.  If narcotic pain medicine is not needed, then you may take acetaminophen (Tylenol) or ibuprofen (Advil) as needed.  Take your usually prescribed medications unless otherwise directed.  If you need a refill on your pain medication, please contact your pharmacy.  They will contact our office to request authorization. Prescriptions will not be filled after 5 P.M. or on weekends.  You should follow a light diet the first few days after arrival home, such as soup and crackers or toast.  Be sure to include plenty of fluids daily.  Most  patients will experience some swelling and bruising in the area of the incisions.  Ice packs will help.  Swelling and bruising can take several days to resolve.   It is common to experience some constipation after surgery.  Increasing fluid intake and taking a stool softener (such as Colace) will usually help or prevent this problem from occurring.  A mild laxative (Milk of Magnesia or Miralax) should be taken according to package instructions if there has been no bowel movement after 48 hours.  You will have steri-strips and a gauze dressing over your incisions.  You may remove the gauze bandage on the second day after surgery, and you may shower at that time.  Leave your steri-strips (small skin tapes) in place directly over the incision.  These strips should remain on the skin for 5-7 days and then be removed.  You may get them wet in the shower and pat them dry.  Any sutures or staples will be removed at the office during your follow-up visit.  ACTIVITIES:  You may resume regular (light) daily activities beginning the next day - such as daily self-care, walking, climbing stairs - gradually increasing activities as tolerated.  You may have sexual intercourse when it is comfortable.  Refrain from any heavy lifting or straining until approved by your doctor.  You may drive when you are no longer taking prescription pain medication, you can comfortably wear a seatbelt, and you can safely maneuver your car and apply brakes.  You should see your doctor in the office for a follow-up appointment approximately 2-3 weeks after  your surgery.  Make sure that you call for this appointment within a day or two after you arrive home to insure a convenient appointment time.  WHEN TO CALL YOUR DOCTOR: Fever over 101.0 Inability to urinate Continued bleeding from incision Increased pain, redness, or drainage from the incision Increasing abdominal pain  The clinic staff is available to answer your questions  during regular business hours.  Please don't hesitate to call and ask to speak to one of the nurses for clinical concerns.  If you have a medical emergency, go to the nearest emergency room or call 911.  A surgeon from [facility] is always on call for the hospital.  Velora Heckler, MD, [facility] Office: [phone] Toll Free:  [phone] FAX [phone]  Website: [email or web address]   Increase activity slowly   Complete by:  As directed    No dressing needed   Complete by:  As directed      Allergies as of 10/12/2018   No Known Allergies     Medication List    TAKE these medications   BC HEADACHE POWDER PO Take 1 packet by mouth every evening.   buPROPion 300 MG 24 hr tablet Commonly known as:  WELLBUTRIN XL Take 300 mg by mouth daily.   ibuprofen 200 MG tablet Commonly known as:  ADVIL,MOTRIN Take 400 mg by mouth daily as needed for moderate pain.   lamoTRIgine 150 MG tablet Commonly known as:  LAMICTAL Take 1 tablet (150 mg total) by mouth daily.   Melatonin 5 MG Tabs Take 5 mg by mouth at bedtime as needed (sleep).   oxyCODONE-acetaminophen 5-325 MG tablet Commonly known as:  Percocet Take 1-2 tablets by mouth every 6 (six) hours as needed for severe pain.   pantoprazole 40 MG tablet Commonly known as:  PROTONIX Take 40 mg by mouth daily.   Restasis 0.05 % ophthalmic emulsion Generic drug:  cycloSPORINE Place 2 drops into both eyes 2 (two) times daily.   traZODone 50 MG tablet Commonly known as:  DESYREL Take 50 mg by mouth at bedtime.   TUMS PO Take 4 tablets by mouth at bedtime as needed (heartburn).      Follow-up Information    [facility] Follow up on 10/24/2018.   Specialty:  General Surgery Why:  2:45pm, arrive by 2:15pm for paperwork and check in.  please bring photo ID and insurance card with you Contact information: [address] [place] [address]  [phone]           Velora Heckler, MD, [facility] Office: [phone]   Signed: Darnell Level 10/12/2018, 9:08 AM

## 2018-10-12 NOTE — Progress Notes (Addendum)
MEWS Guidelines - (patients age 57 and over)  Red - At High Risk for Deterioration Yellow - At risk for Deterioration  1. Go to room and assess patient 2. Validate data. Is this patient's baseline? If data confirmed: 3. Is this an acute change? 4. Administer prn meds/treatments as ordered. 5. Note Sepsis score 6. Review goals of care 7. Notify Charge Nurse, RRT nurse and Provider. 8. Ask Provider to come to bedside.  9. Document patient condition/interventions/response. 10. Increase frequency of vital signs and focused assessments to at least q15 minutes x 4, then q30 minutes x2. - If stable, then q1h x3, then q4h x3 and then q8h or dept. routine. - If unstable, contact Provider & RRT nurse. Prepare for possible transfer. 11. Add entry in progress notes using the smart phrase ".MEWS". 1. Go to room and assess patient 2. Validate data. Is this patient's baseline? If data confirmed: 3. Is this an acute change? 4. Administer prn meds/treatments as ordered? 5. Note Sepsis score 6. Review goals of care 7. Notify Charge Nurse and Provider 8. Call RRT nurse as needed. 9. Document patient condition/interventions/response. 10. Increase frequency of vital signs and focused assessments to at least q2h x2. - If stable, then q4h x2 and then q8h or dept. routine. - If unstable, contact Provider & RRT nurse. Prepare for possible transfer. 11. Add entry in progress notes using the smart phrase ".MEWS".  Green - Likely stable Lavender - Comfort Care Only  1. Continue routine/ordered monitoring.  2. Review goals of care. 1. Continue routine/ordered monitoring. 2. Review goals of care.   Vs monitored per protocol  

## 2018-10-12 NOTE — Progress Notes (Signed)
Patient called back after being discharged, stating that he is having an allergic reaction to the percocet he was prescribed. He is experiencing extreme itching. Talked to Dr. Darnell Level. Advised patient to take 50 mg of benadryl and either Advil or Ibuprofen for pain. If that is not sufficient he is to call the office number & Dr. Gerrit Friends will call in a different pain medicine.

## 2018-10-13 NOTE — Anesthesia Postprocedure Evaluation (Signed)
Anesthesia Post Note  Patient: Douglas Hester  Procedure(s) Performed: LAPAROSCOPIC CHOLECYSTECTOMY WITH INTRAOPERATIVE CHOLANGIOGRAM (N/A Abdomen)     Patient location during evaluation: PACU Anesthesia Type: General Level of consciousness: awake and alert Pain management: pain level controlled Vital Signs Assessment: post-procedure vital signs reviewed and stable Respiratory status: spontaneous breathing, nonlabored ventilation, respiratory function stable and patient connected to nasal cannula oxygen Cardiovascular status: blood pressure returned to baseline and stable Postop Assessment: no apparent nausea or vomiting Anesthetic complications: no    Last Vitals:  Vitals:   10/12/18 0527 10/12/18 0800  BP: 128/80 129/84  Pulse: (!) 112 (!) 106  Resp: 18   Temp: 37 C   SpO2: 98% 90%    Last Pain:  Vitals:   10/12/18 0940  TempSrc:   PainSc: 3                  Rosemond Lyttle S

## 2018-10-14 ENCOUNTER — Encounter (HOSPITAL_COMMUNITY): Payer: Self-pay | Admitting: General Surgery

## 2018-11-04 ENCOUNTER — Other Ambulatory Visit: Payer: Self-pay

## 2018-11-04 ENCOUNTER — Ambulatory Visit (INDEPENDENT_AMBULATORY_CARE_PROVIDER_SITE_OTHER): Payer: Self-pay | Admitting: Psychiatry

## 2018-11-04 DIAGNOSIS — F3130 Bipolar disorder, current episode depressed, mild or moderate severity, unspecified: Secondary | ICD-10-CM

## 2018-11-04 DIAGNOSIS — F331 Major depressive disorder, recurrent, moderate: Secondary | ICD-10-CM

## 2018-11-04 MED ORDER — LAMOTRIGINE 150 MG PO TABS: 300.0000 mg | ORAL_TABLET | Freq: Every day | ORAL | 1 refills | Status: DC

## 2018-11-04 NOTE — Progress Notes (Signed)
Virtual Visit via Telephone Note  I connected with Douglas Hester on 11/04/18 at  9:00 AM EDT by telephone and verified that I am speaking with the correct person using two identifiers.   I discussed the limitations, risks, security and privacy concerns of performing an evaluation and management service by telephone and the availability of in person appointments. I also discussed with the patient that there may be a patient responsible charge related to this service. The patient expressed understanding and agreed to proceed.   History of Present Illness: Patient was evaluated through phone session.  He was seen first time 6 weeks ago.  Patient diagnosed with bipolar disorder and depression.  He was referred from his primary care physician because he did not felt the medicine working.  He is taking Wellbutrin and Lamictal and we have increase Lamictal from 100 mg 150 mg.  He noticed marginal improvement in his mood.  He is not as irritable angry but he continues to have frustration and impulsive behavior.  Last month he was admitted for gallbladder stone surgery.  He is recovering from it and currently he is on medical leave but hoping to start next Monday.  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  He noticed improvement in his relationship with his girlfriend.  He feel that staying home help his sleep cycle and also his relationship.  His primary care physician prescribed trazodone but it did not work.  He was given pain medicine but he developed itching and he is no longer taking any pain medication.  His itching is resolved.  Denies any auditory or visual hallucination.  He is taking Wellbutrin and now Lamictal 150 mg.  We talked about increasing the dose of Lamictal up to 300 and if no improvement then consider either Abilify or GeneSight testing.  Patient agreed with the plan.  He denies any paranoia, hallucination or any suicidal thoughts.  He has not discussed with his employer  about starting Rozerem.  He is concerned once he start working his mood, insomnia and irritability may get worse.  He like to try higher dose of Lamictal to avoid the symptoms in the future.  He has some anxiety due to pandemic coronavirus but he denies any panic attack.  His energy level is fair.  He is seeing therapist Douglas Hester in [place].  He denies drinking or using any illegal substances.  Past Psychiatric History: Saw Dr. Jennelle Hester few years ago. Diagnosed with bipolar disorder and depression.  Prescribed Lamictal and Wellbutrin.  PCP tried Prozac and Paxil but stopped due to sexual side effects.  No h/o suicidal attempt, psychosis and inpatient treatment.  Recent Results (from the past 2160 hour(s))  Lipase, blood     Status: None   Collection Time: 10/11/18  7:58 AM  Result Value Ref Range   Lipase 34 11 - 51 U/L    Comment: Performed at [facility] [place], [address] [place], [place] [id number]  Comprehensive metabolic panel     Status: Abnormal   Collection Time: 10/11/18  7:58 AM  Result Value Ref Range   Sodium 137 135 - 145 mmol/L   Potassium 3.8 3.5 - 5.1 mmol/L   Chloride 103 98 - 111 mmol/L   CO2 25 22 - 32 mmol/L   Glucose, Bld 137 (H) 70 - 99 mg/dL   BUN 15 6 - 20 mg/dL   Creatinine, Ser 1.910.81 0.61 - 1.24 mg/dL   Calcium 9.5 8.9 -  10.3 mg/dL   Total Protein 7.2 6.5 - 8.1 g/dL   Albumin 4.3 3.5 - 5.0 g/dL   AST 82 (H) 15 - 41 U/L   ALT 61 (H) 0 - 44 U/L   Alkaline Phosphatase 88 38 - 126 U/L   Total Bilirubin 1.4 (H) 0.3 - 1.2 mg/dL   GFR calc non Af Amer >60 >60 mL/min   GFR calc Af Amer >60 >60 mL/min   Anion gap 9 5 - 15    Comment: Performed at [facility], [address] [place], [place] [address]  CBC     Status: Abnormal   Collection Time: 10/11/18  7:58 AM  Result Value Ref Range   WBC 11.9 (H) 4.0 - 10.5 K/uL   RBC 5.03 4.22 - 5.81 MIL/uL   Hemoglobin 14.5 13.0 - 17.0 g/dL   HCT 95.6 21.3 - 08.6 %   MCV 89.9 80.0 - 100.0  fL   MCH 28.8 26.0 - 34.0 pg   MCHC 32.1 30.0 - 36.0 g/dL   RDW 57.8 46.9 - 62.9 %   Platelets 232 150 - 400 K/uL   nRBC 0.0 0.0 - 0.2 %    Comment: Performed at [facility], [address] [place], [place] [address]  Urinalysis, Routine w reflex microscopic     Status: Abnormal   Collection Time: 10/11/18 11:31 AM  Result Value Ref Range   Color, Urine STRAW (A) YELLOW   APPearance CLEAR CLEAR   Specific Gravity, Urine 1.002 (L) 1.005 - 1.030   pH 7.0 5.0 - 8.0   Glucose, UA NEGATIVE NEGATIVE mg/dL   Hgb urine dipstick SMALL (A) NEGATIVE   Bilirubin Urine NEGATIVE NEGATIVE   Ketones, ur NEGATIVE NEGATIVE mg/dL   Protein, ur NEGATIVE NEGATIVE mg/dL   Nitrite NEGATIVE NEGATIVE   Leukocytes,Ua NEGATIVE NEGATIVE   RBC / HPF 0-5 0 - 5 RBC/hpf   WBC, UA 0-5 0 - 5 WBC/hpf   Bacteria, UA NONE SEEN NONE SEEN    Comment: Performed at [facility], [address] [place], [place] [address]    Observations/Objective: Limited mental status examination done on the phone.  He describes his mood okay.  He reported at times irritability and frustration but denies any active or passive suicidal thoughts or homicidal thought.  He denies any auditory or visual hallucination.  His fund of knowledge is adequate.  His speech is clear, coherent with normal tone and volume.  There were no flight of ideas or loose associations.  His thinking is organized and there were no delusions.  He is alert and oriented x3.  His cognition is intact.  His insight and judgment is okay.  Assessment and Plan: Major depressive disorder, recurrent.  Bipolar disorder recently depressed.  I review his records, recent blood work results and current medication.  He is no longer taking trazodone and pain medication.  We discussed trying Lamictal up to 300 with gradual increase.  So far he reported no side effects of the medication including rash.  Encouraged to continue therapy.  He is feeling  better since he is out of work due to surgery but hoping to go back to work next week.  He is also concerned that his symptoms may get worse when he go back to work.  I reminded that he should discuss with his employer about starting Rozerem since he takes melatonin on and off which works.  We also talked about gene site  testing if Lamictal did not help him then we may either consider gene site testing or trial of Abilify.  He will continue Wellbutrin XL 300 mg daily prescribed by PCP.  Discussed medication side effect specially if he develop a rash with the Lamictal then he need to stop the medication.  He has refill remaining on 100 mg and 150 mg.  I explained that he need to take 200 for 10 days and then 250 mg for another 10 days and then take 300.  If he has any question, concern, feel worsening of the symptoms then he should call us immediately.  Follow-up in 4 weeks.  Follow Up Instructions:    I discussed the assessment and treatment plan with the patient. The patient was provided an opportunity to ask questions and all were answered. The patient agreed with the plan and demonstrated an understanding of the instructions.   The patient was advised to call back or seek an in-person evaluation if the symptoms worsen or if the condition fails to improve as anticipated.  I provided 30 minutes of non-face-to-face time during this encounter.   Cleotis Nipper, MD

## 2018-11-10 ENCOUNTER — Other Ambulatory Visit (HOSPITAL_COMMUNITY): Payer: Self-pay | Admitting: Psychiatry

## 2018-11-10 DIAGNOSIS — F331 Major depressive disorder, recurrent, moderate: Secondary | ICD-10-CM

## 2018-11-10 DIAGNOSIS — F3130 Bipolar disorder, current episode depressed, mild or moderate severity, unspecified: Secondary | ICD-10-CM

## 2018-11-24 ENCOUNTER — Other Ambulatory Visit (HOSPITAL_COMMUNITY): Payer: Self-pay | Admitting: Psychiatry

## 2018-11-24 DIAGNOSIS — F3130 Bipolar disorder, current episode depressed, mild or moderate severity, unspecified: Secondary | ICD-10-CM

## 2018-11-24 DIAGNOSIS — F331 Major depressive disorder, recurrent, moderate: Secondary | ICD-10-CM

## 2018-12-02 ENCOUNTER — Other Ambulatory Visit: Payer: Self-pay

## 2018-12-02 ENCOUNTER — Ambulatory Visit (INDEPENDENT_AMBULATORY_CARE_PROVIDER_SITE_OTHER): Payer: 59 | Admitting: Psychiatry

## 2018-12-02 ENCOUNTER — Encounter (HOSPITAL_COMMUNITY): Payer: Self-pay | Admitting: Psychiatry

## 2018-12-02 DIAGNOSIS — F331 Major depressive disorder, recurrent, moderate: Secondary | ICD-10-CM

## 2018-12-02 DIAGNOSIS — F3130 Bipolar disorder, current episode depressed, mild or moderate severity, unspecified: Secondary | ICD-10-CM | POA: Diagnosis not present

## 2018-12-02 MED ORDER — LAMOTRIGINE 150 MG PO TABS: 300.0000 mg | ORAL_TABLET | Freq: Every day | ORAL | 0 refills | Status: DC

## 2018-12-02 MED ORDER — BUPROPION HCL ER (XL) 300 MG PO TB24: 300.0000 mg | ORAL_TABLET | Freq: Every day | ORAL | 0 refills | Status: DC

## 2018-12-02 NOTE — Progress Notes (Signed)
Virtual Visit via Telephone Note  I connected with Douglas Hester on 12/02/18 at  9:00 AM EDT by telephone and verified that I am speaking with the correct person using two identifiers.   I discussed the limitations, risks, security and privacy concerns of performing an evaluation and management service by telephone and the availability of in person appointments. I also discussed with the patient that there may be a patient responsible charge related to this service. The patient expressed understanding and agreed to proceed.   History of Present Illness: Patient was evaluated through phone session.  On his last visit we increased his Lamictal and he is now taking 300 mg daily.  He admitted much improvement in his mood irritability and frustration but he still gets some time emotional when he sees funeral of veterans.  He admitted being Patriotic.  His Lamictal helped his irritability.  His relationship with his wife is better.  He started working 3 weeks ago after surgery.  His job is going well.  He reported no tremors, shakes, rash or any itching but noticed sometimes sexual side effects.  He is not clear if the Lamictal causing it.  He is not taking any new medication.  He is taking melatonin which works sometimes.  He forgot to ask his employer if Rozerem can be a choice to take for sleep.  He like to continue current medication since he has seen much improvement.  We also discussed about gene site testing and at this time he does not feel he needed since medicine working.  His appetite is okay.  Since back to work he believes gained weight as less ambulatory.  He denies drinking or using any illegal substances.    Past Psychiatric History:Saw Dr. Jennelle Humanottle few years ago. Diagnosed with bipolar disorder and depression. Prescribed Lamictal and Wellbutrin. PCP tried Prozac, Paxil (sexual side effects) and trazodone (did not work). No h/o suicidal attempt, psychosis and inpatient treatment.    Observations/Objective: Mental status mentioned on the phone.  He describes his mood okay.  His speech is normal with increased tone but clear and coherent.  His thought process logical and goal-directed.  He denies any auditory or visual hallucination.  His attention and concentration is okay.  There were no flight of ideas or loose association.  He denies any active or passive suicidal thoughts or homicidal thought.  He is alert and oriented x3.  There were no grandiosity or delusions.  His fund of knowledge is adequate.  He reported no tremors, shakes or any EPS.  His cognition is intact.  His thinking is organized.  His insight and judgment is okay.  Assessment and Plan: Major depressive disorder, recurrent.  Rule out bipolar disorder, depressed type.  Patient is doing better on Lamictal 300 mg daily and Wellbutrin XL 300 mg daily.  Discussed medication side effects and benefits.  We discussed that Lamictal can cause her side effects but usually it is neutral.  I recommend if sexual side effects remains issue and did not go away in the future then we can consider switching the medication.  We also discussed gene site testing and at this time patient does not feel he needed.  His relationship with the girlfriend is improved.  We discussed medication side effects and benefits.  Recommended to call us back if is any question or any concern.  Follow-up in 3 months.  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  Follow Up Instructions:    I discussed the assessment and treatment plan with the patient. The patient was provided an opportunity to ask questions and all were answered. The patient agreed with the plan and demonstrated an understanding of the instructions.   The patient was advised to call back or seek an in-person evaluation if the symptoms worsen or if the condition fails to improve as anticipated.  I provided 20 minutes of  non-face-to-face time during this encounter.   Cleotis Nipper, MD

## 2018-12-03 IMAGING — US US ABDOMEN LIMITED
1 series · 14 of 25 positions shown · non-contrast
Comparison: Abdominal CT 04/29/2017

CLINICAL DATA: Elevated LFTs.

EXAM:
ULTRASOUND ABDOMEN LIMITED RIGHT UPPER QUADRANT

[Series 1: us abdomen limited · 0.20mm/px · 14 of 45 slices shown]
[im 1/45]
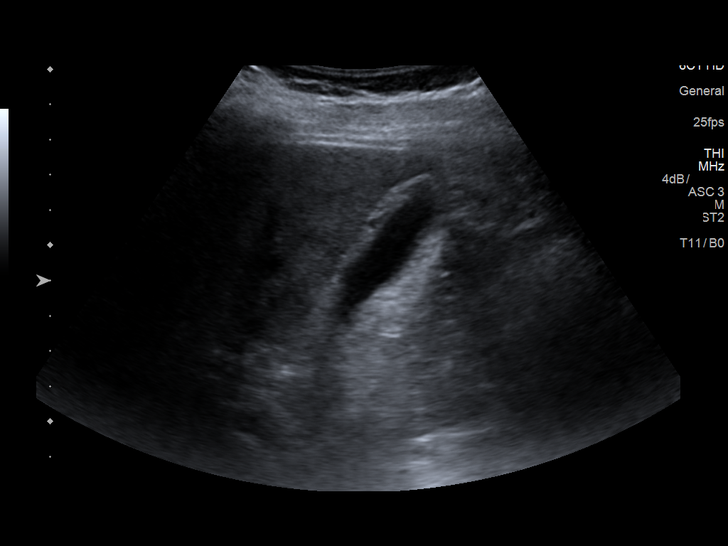
[im 4/45]
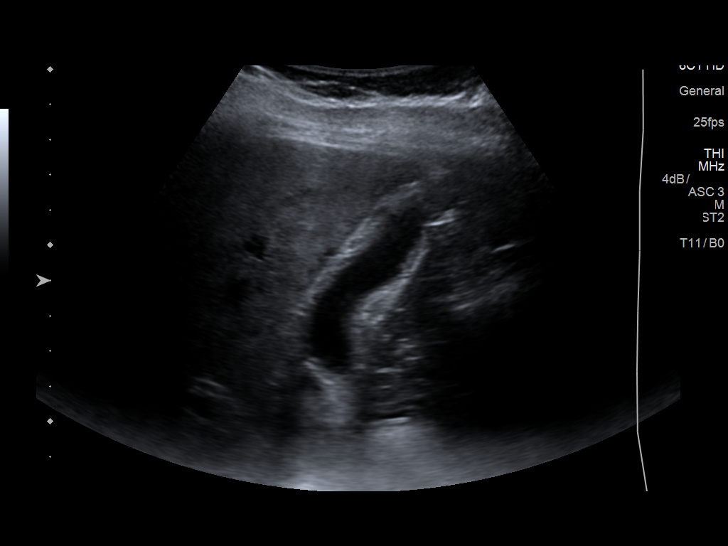
[im 8/45]
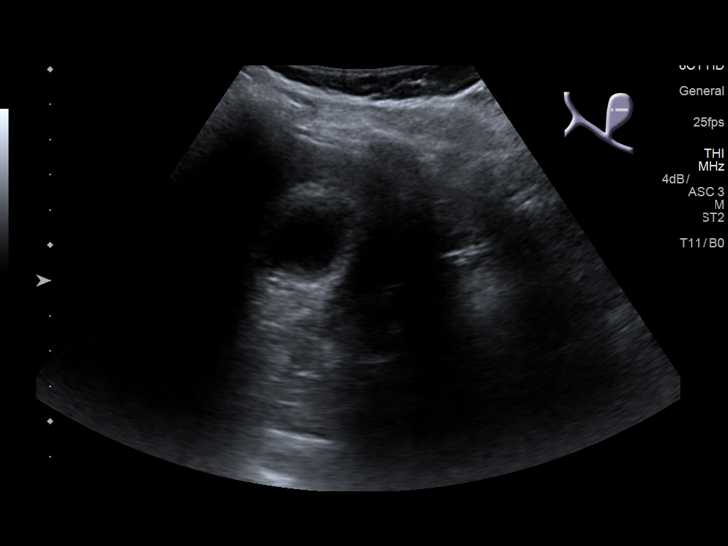
[im 12/45]
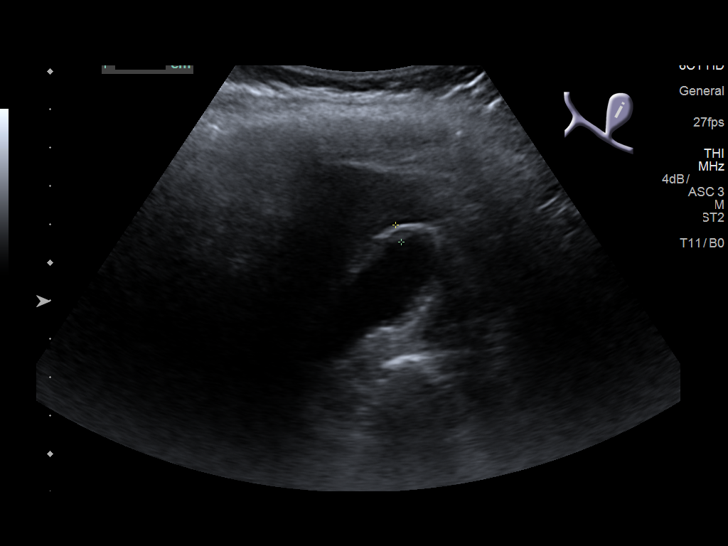
[im 15/45]
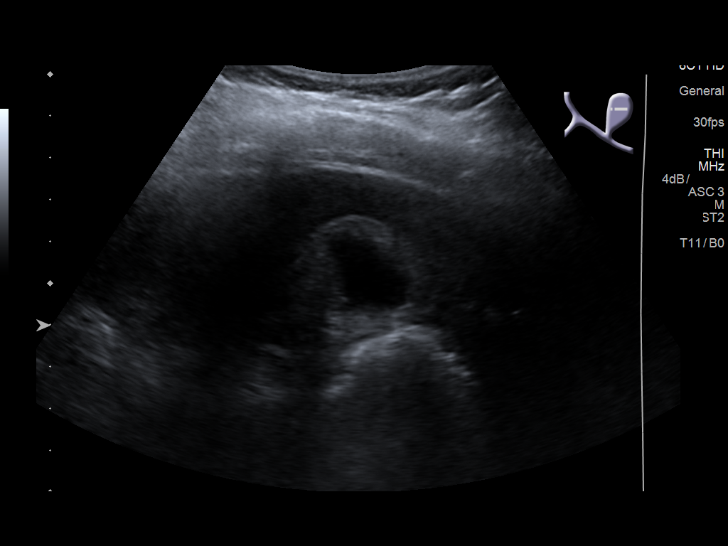
[im 17/45]
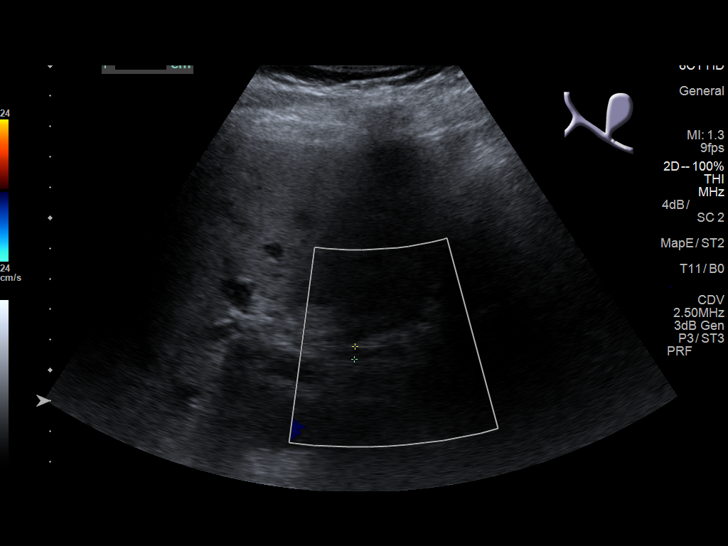
[im 21/45]
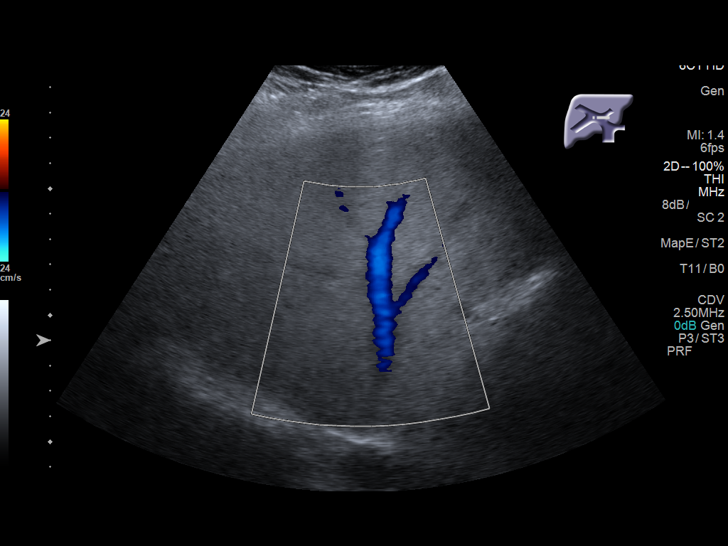
[im 24/45]
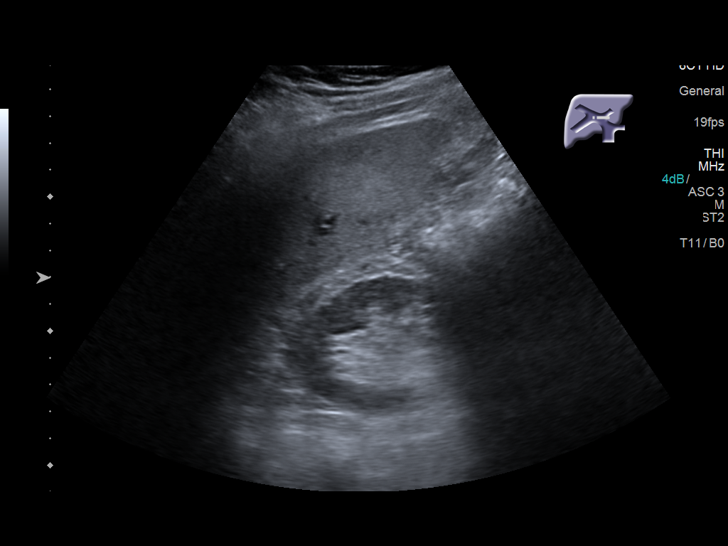
[im 28/45]
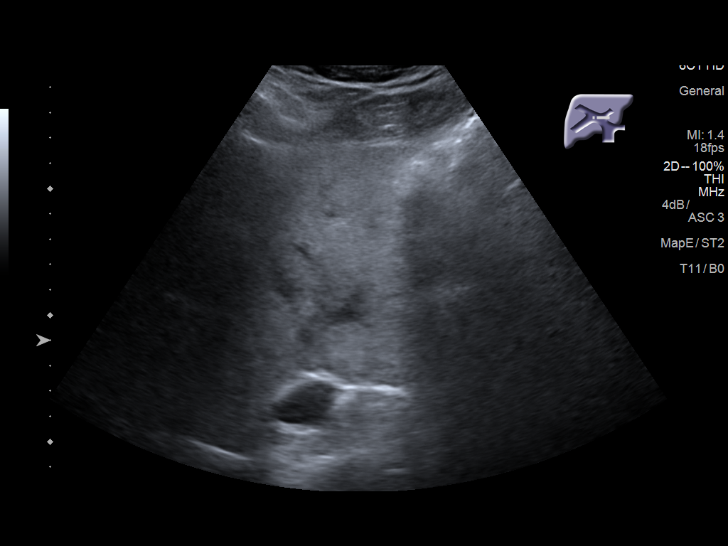
[im 30/45]
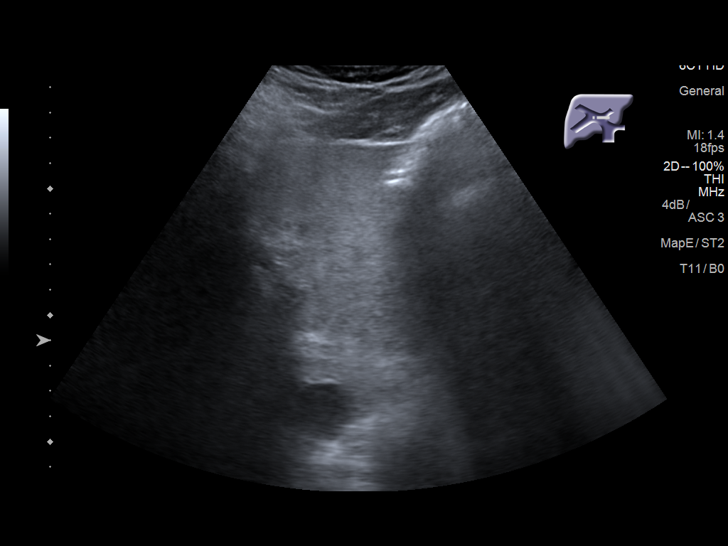
[im 34/45]
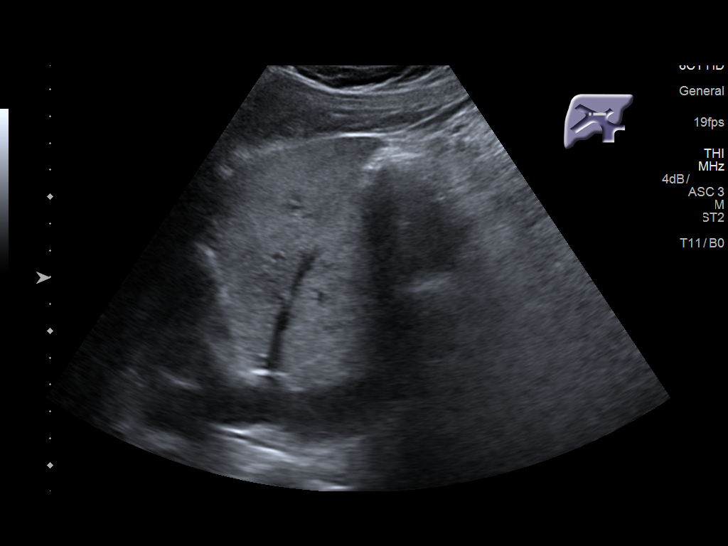
[im 37/45]
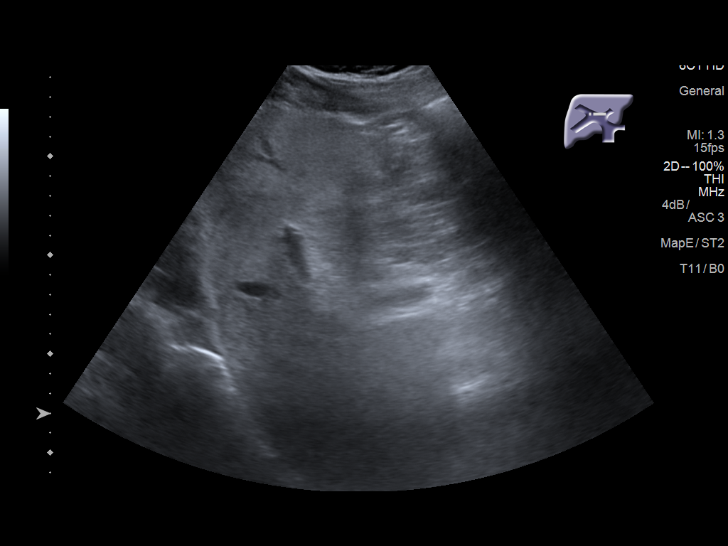
[im 41/45]
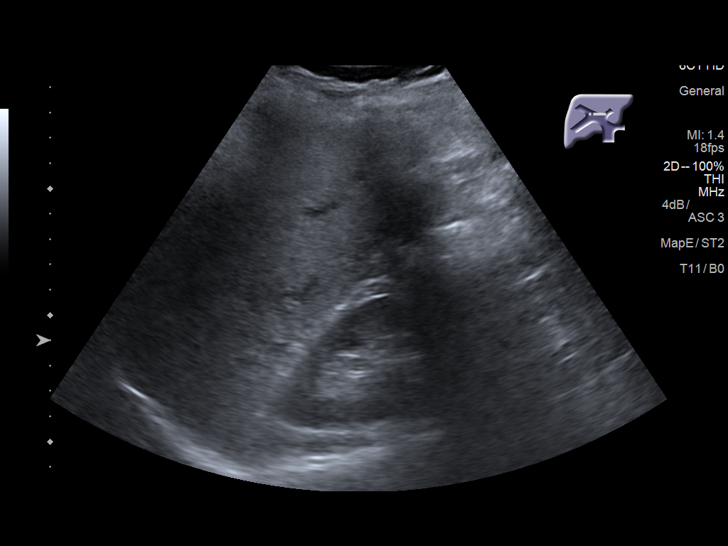
[im 45/45]
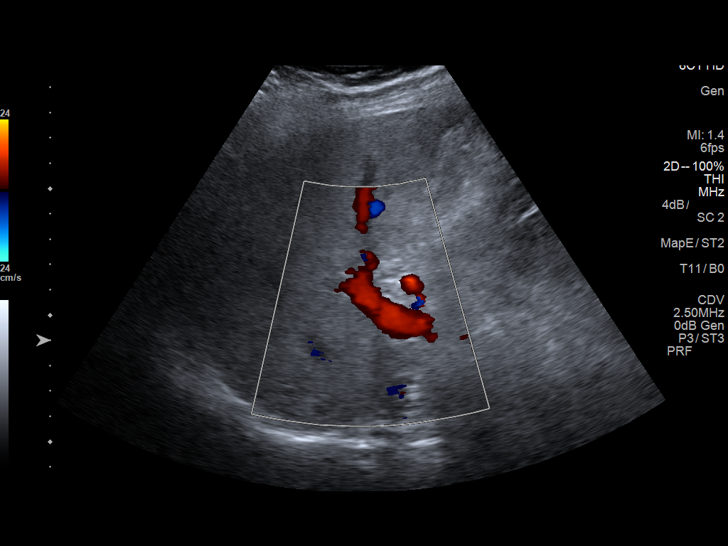

[14 of 25 positions shown; findings below may reference images not displayed]

FINDINGS: Gallbladder:

Gallbladder is decompressed but there is wall thickening measuring
up to 0.5 cm. No gallstones or sludge. Patient does not have a
sonographic Murphy sign.

Common bile duct:

Diameter: 0.4 cm

Liver:

Increased echogenicity of the liver parenchyma. No intrahepatic
biliary dilatation. No suspicious liver lesions. Portal vein is
patent on color Doppler imaging with normal direction of blood flow
towards the liver.
IMPRESSION: Gallbladder wall is mildly thickened but no gallbladder distension
and no gallstones. Findings are nonspecific with regards to
gallbladder inflammation.

Slightly increased echogenicity in the liver parenchyma. There may
be a component of hepatic steatosis.

## 2019-03-02 ENCOUNTER — Other Ambulatory Visit (HOSPITAL_COMMUNITY): Payer: Self-pay | Admitting: Psychiatry

## 2019-03-02 DIAGNOSIS — F331 Major depressive disorder, recurrent, moderate: Secondary | ICD-10-CM

## 2019-03-02 DIAGNOSIS — F3130 Bipolar disorder, current episode depressed, mild or moderate severity, unspecified: Secondary | ICD-10-CM

## 2019-03-05 ENCOUNTER — Ambulatory Visit (HOSPITAL_COMMUNITY): Payer: Self-pay | Admitting: Psychiatry

## 2019-03-17 ENCOUNTER — Other Ambulatory Visit: Payer: Self-pay

## 2019-03-17 ENCOUNTER — Encounter (HOSPITAL_COMMUNITY): Payer: Self-pay | Admitting: Psychiatry

## 2019-03-17 ENCOUNTER — Ambulatory Visit (INDEPENDENT_AMBULATORY_CARE_PROVIDER_SITE_OTHER): Payer: 59 | Admitting: Psychiatry

## 2019-03-17 DIAGNOSIS — F331 Major depressive disorder, recurrent, moderate: Secondary | ICD-10-CM

## 2019-03-17 DIAGNOSIS — F3130 Bipolar disorder, current episode depressed, mild or moderate severity, unspecified: Secondary | ICD-10-CM | POA: Diagnosis not present

## 2019-03-17 MED ORDER — BUPROPION HCL ER (XL) 300 MG PO TB24: 300.0000 mg | ORAL_TABLET | Freq: Every day | ORAL | 0 refills | Status: DC

## 2019-03-17 MED ORDER — LAMOTRIGINE 200 MG PO TABS: 200.0000 mg | ORAL_TABLET | Freq: Every day | ORAL | 0 refills | Status: DC

## 2019-03-17 NOTE — Progress Notes (Signed)
Virtual Visit via Telephone Note  I connected with Douglas Hester on 03/17/19 at  9:40 AM EDT by telephone and verified that I am speaking with the correct person using two identifiers.   I discussed the limitations, risks, security and privacy concerns of performing an evaluation and management service by telephone and the availability of in person appointments. I also discussed with the patient that there may be a patient responsible charge related to this service. The patient expressed understanding and agreed to proceed.   History of Present Illness: Patient was evaluated through phone session.  He has cut down his Lamictal back to 200 because he noticed sexual side effects with 300.  He admitted irritability and sometimes mood swings but it is not as bad and he can handle it.  We believe his biggest stressor is working at nighttime.  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  He does not get good sleep and gets tired in the morning.  He lives with his fiance and relationship is going well.  He has no rash, tremors or shakes.  Sometime he takes melatonin for sleep.  He denies any crying spells or any suicidal thoughts.  He takes Wellbutrin to help his mood.  He denies drinking or using any illegal substances.  He is trying to lose weight and he has lost 13 pounds in past 3 months.  He does not want to go back to Lamictal due to sexual side effects.  His energy level is good.  He denies any mania or any crying spells.  Past Psychiatric History:SawDr. Jennelle HumanCottle few years ago.Diagnosed with bipolar disorder and depression. Prescribed Lamictal and Wellbutrin. PCP tried Prozac, Paxil (sexual side effects) and trazodone (did not work). No h/osuicidal attempt, psychosis and inpatient treatment.   Psychiatric Specialty Exam: Physical Exam  ROS  There were no vitals taken for this visit.There is no height or weight on file to calculate BMI.  General Appearance: NA  Eye  Contact:  NA  Speech:  Normal Rate  Volume:  Normal  Mood:  sometimes irritible  Affect:  NA  Thought Process:  Goal Directed  Orientation:  Full (Time, Place, and Person)  Thought Content:  Rumination  Suicidal Thoughts:  No  Homicidal Thoughts:  No  Memory:  Immediate;   Good Recent;   Good Remote;   Good  Judgement:  Fair  Insight:  Fair  Psychomotor Activity:  NA  Concentration:  Concentration: Fair and Attention Span: Fair  Recall:  Good  Fund of Knowledge:  Good  Language:  Good  Akathisia:  No  Handed:  Right  AIMS (if indicated):     Assets:  Communication Skills Desire for Improvement Housing Resilience Social Support Talents/Skills Transportation  ADL's:  Intact  Cognition:  WNL  Sleep:   fair      Assessment and Plan: Major depressive disorder, recurrent.  Rule out bipolar disorder, depressed type.  Patient cannot tolerate 300 mg due to sexual side effects and went back to 200 mg.  I also offer if he is willing to try 250 mg but patient feel that he is doing okay with the current dose.  He has some irritability and mood swings but mostly he is able to control.  His relationship with her fianc as well.  He is hoping to get married 1 day soon.  We will continue Wellbutrin XL 300 mg daily and reduce Lamictal 200 mg daily.  Patient has no  rash or any itching.  He is not interested in therapy.  Recommended to call us back if he has any question or any concern.  Follow-up in 3 months.  Follow Up Instructions:    I discussed the assessment and treatment plan with the patient. The patient was provided an opportunity to ask questions and all were answered. The patient agreed with the plan and demonstrated an understanding of the instructions.   The patient was advised to call back or seek an in-person evaluation if the symptoms worsen or if the condition fails to improve as anticipated.  I provided 20 minutes of non-face-to-face time during this  encounter.   Kathlee Nations, MD

## 2019-06-16 ENCOUNTER — Other Ambulatory Visit: Payer: Self-pay

## 2019-06-16 ENCOUNTER — Ambulatory Visit (INDEPENDENT_AMBULATORY_CARE_PROVIDER_SITE_OTHER): Payer: 59 | Admitting: Psychiatry

## 2019-06-16 ENCOUNTER — Encounter (HOSPITAL_COMMUNITY): Payer: Self-pay | Admitting: Psychiatry

## 2019-06-16 DIAGNOSIS — F3130 Bipolar disorder, current episode depressed, mild or moderate severity, unspecified: Secondary | ICD-10-CM | POA: Diagnosis not present

## 2019-06-16 DIAGNOSIS — F331 Major depressive disorder, recurrent, moderate: Secondary | ICD-10-CM | POA: Diagnosis not present

## 2019-06-16 DIAGNOSIS — G4700 Insomnia, unspecified: Secondary | ICD-10-CM | POA: Diagnosis not present

## 2019-06-16 MED ORDER — RAMELTEON 8 MG PO TABS: 8.0000 mg | ORAL_TABLET | Freq: Every day | ORAL | 0 refills | Status: DC

## 2019-06-16 MED ORDER — LAMOTRIGINE 200 MG PO TABS: 200.0000 mg | ORAL_TABLET | Freq: Every day | ORAL | 0 refills | Status: DC

## 2019-06-16 MED ORDER — BUPROPION HCL ER (XL) 300 MG PO TB24: 300.0000 mg | ORAL_TABLET | Freq: Every day | ORAL | 0 refills | Status: DC

## 2019-06-16 NOTE — Progress Notes (Signed)
Virtual Visit via Telephone Note  I connected with Noel Christmas on 06/16/19 at  9:20 AM EST by telephone and verified that I am speaking with the correct person using two identifiers.   I discussed the limitations, risks, security and privacy concerns of performing an evaluation and management service by telephone and the availability of in person appointments. I also discussed with the patient that there may be a patient responsible charge related to this service. The patient expressed understanding and agreed to proceed.   History of Present Illness: Patient was evaluated through phone session.  Lately he has been feeling sad and dysphoric because his relationship ended.  Patient told that we follow part because she has depression and things are not going very well for her.  He admitted feeling more isolated as he does not leave his home unless he goes to work.  Patient claimed that he is a people person but lately due to Covid not able to go outside.  However he is going to [place] to attend his parents manage and also he will visit his brother.  Patient told he check with his employer that he can take Rozerem for sleep.  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  He feels his mood irritability anger and mania is under control.  However he feels sometimes sad and tired because of lack of sleep.  He is taking melatonin 10 mg but sometimes he does work and sometimes it does not work.  We have suggested to check if his employer allow him to take Rozerem and now he is willing to try it.  Patient denies drinking or using any illegal substances.  His appetite is okay.  Energy level is fair.  He denies any hallucination, paranoia or any suicidal thoughts.  Past Psychiatric History:SawDr. Jennelle Human few years ago.Diagnosed with bipolar disorder and depression. Prescribed Lamictal and Wellbutrin. PCP tried Prozac,Paxil(sexual side effects) and trazodone (did not work). No  h/osuicidal attempt, psychosis and inpatient treatment.    Psychiatric Specialty Exam: Physical Exam  ROS  There were no vitals taken for this visit.There is no height or weight on file to calculate BMI.  General Appearance: NA  Eye Contact:  NA  Speech:  Normal Rate  Volume:  Normal  Mood:  Dysphoric  Affect:  NA  Thought Process:  Goal Directed  Orientation:  Full (Time, Place, and Person)  Thought Content:  Rumination  Suicidal Thoughts:  No  Homicidal Thoughts:  No  Memory:  Immediate;   Good Recent;   Good Remote;   Good  Judgement:  Fair  Insight:  Good  Psychomotor Activity:  NA  Concentration:  Concentration: Good and Attention Span: Good  Recall:  Good  Fund of Knowledge:  Good  Language:  Good  Akathisia:  No  Handed:  Right  AIMS (if indicated):     Assets:  Communication Skills Desire for Improvement Housing Resilience  ADL's:  Intact  Cognition:  WNL  Sleep:   4-5 hrs      Assessment and Plan: Bipolar disorder, depressed type.  Insomnia.  We will add Rozerem 8 mg at bedtime to help his insomnia.  I recommended therapy but at this time patient does not feel he needed.  Does not want to increase his Lamictal which we tried in the past but having sexual side effects.  Continue Lamictal 200 mg daily and Wellbutrin XL 300 mg daily.  He has no rash, itching, tremors or shakes.  Recommended  to call us back if he has any question of any concern.  We will provide a 30-day supply of Rozerem 8 mg however I suggested he should call us to get refills if it works.  I also suggested that he should do physical and blood work since he has not done in a while.  He agreed with the plan.  Recommended to call us back if is any question or any concern.  Follow-up in 3 months.  Follow Up Instructions:    I discussed the assessment and treatment plan with the patient. The patient was provided an opportunity to ask questions and all were answered. The patient agreed with the  plan and demonstrated an understanding of the instructions.   The patient was advised to call back or seek an in-person evaluation if the symptoms worsen or if the condition fails to improve as anticipated.  I provided 20 minutes of non-face-to-face time during this encounter.   Kathlee Nations, MD

## 2019-06-23 ENCOUNTER — Other Ambulatory Visit (HOSPITAL_COMMUNITY): Payer: Self-pay

## 2019-06-23 NOTE — Telephone Encounter (Signed)
Medication refill request - Fax received from pt's [facility] requesting a 90 day order for his Ramelteon.  A 30 day order was sent 06/16/19.  Pt. returns 09/08/19

## 2019-06-23 NOTE — Telephone Encounter (Signed)
We wanted him to try a 30-day Rozerem because it is expensive and patient supposed to call us back if it works.  Please call patient and if medicine working then we can do 90-day supply.  My only concern is expensive and I do not want him to pay a 90-day prescription unless it is working.

## 2019-06-24 NOTE — Telephone Encounter (Signed)
Medication management - Message left for patient to question if he would like Dr. Adele Schilder to send in a 44 day order for his newly prescribed Rozerem and to question how it is working for patient?  Requested pt call back to discuss and with information.

## 2019-07-09 NOTE — Telephone Encounter (Addendum)
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 [facility] has spoken with [facility] to notify them of his situation with this medication but we still received a fax regarding the 90 day fill. I have called the pharmacy and faxed back the request with a note to reiterate what patient already told them which is not to fill the medication.

## 2019-09-08 ENCOUNTER — Ambulatory Visit (INDEPENDENT_AMBULATORY_CARE_PROVIDER_SITE_OTHER): Payer: 59 | Admitting: Psychiatry

## 2019-09-08 ENCOUNTER — Encounter (HOSPITAL_COMMUNITY): Payer: Self-pay | Admitting: Psychiatry

## 2019-09-08 ENCOUNTER — Other Ambulatory Visit: Payer: Self-pay

## 2019-09-08 VITALS — Wt 197.0 lb

## 2019-09-08 DIAGNOSIS — F3130 Bipolar disorder, current episode depressed, mild or moderate severity, unspecified: Secondary | ICD-10-CM | POA: Diagnosis not present

## 2019-09-08 DIAGNOSIS — G4700 Insomnia, unspecified: Secondary | ICD-10-CM | POA: Diagnosis not present

## 2019-09-08 MED ORDER — MIRTAZAPINE 15 MG PO TABS: 15.0000 mg | ORAL_TABLET | Freq: Every day | ORAL | 1 refills | Status: DC

## 2019-09-08 MED ORDER — BUPROPION HCL ER (XL) 150 MG PO TB24: 150.0000 mg | ORAL_TABLET | Freq: Every day | ORAL | 0 refills | Status: DC

## 2019-09-08 MED ORDER — LAMOTRIGINE 200 MG PO TABS: 200.0000 mg | ORAL_TABLET | Freq: Every day | ORAL | 0 refills | Status: DC

## 2019-09-08 NOTE — Progress Notes (Signed)
Virtual Visit via Telephone Note  I connected with Douglas Hester on 09/08/19 at  9:20 AM EST by telephone and verified that I am speaking with the correct person using two identifiers.   I discussed the limitations, risks, security and privacy concerns of performing an evaluation and management service by telephone and the availability of in person appointments. I also discussed with the patient that there may be a patient responsible charge related to this service. The patient expressed understanding and agreed to proceed.   History of Present Illness: Patient was evaluated through phone session. He had a good Thanksgiving when he visited his parents and also visited his brother. He decided to stop the Rozerem because he does not see any improvement. He still struggling with insomnia, racing thoughts and chronic feeling of emptiness. He started his relationship again which he had taken the break. Patient is hoping that issues will be resolved slowly and gradually. Patient is a Naval architect and usually working night. His appetite is okay. He does not want any medication that can cause sexual side effects. He admitted sometimes feeling very lonely and now he had started seeing a therapist [employer] [facility] for therapy. He is hoping things get better but wanted something to help his racing thoughts and sleep. Denies any crying spells, suicidal thoughts. Denies drinking or using illegal substances. He denies any highs and lows or any mania.   Past Psychiatric History:SawDr. Jennelle Human few years ago.Diagnosed with bipolar and depression. Given Lamictal and Wellbutrin. Rozerem did not work. PCP tried Prozac,Paxil(sexual side effects) and trazodone (did not work). No h/osuicidal attempt, psychosis and inpatient treatment.    Psychiatric Specialty Exam: Physical Exam  Review of Systems  There were no vitals taken for this visit.There is no height or weight on file to calculate BMI.  General  Appearance: NA  Eye Contact:  NA  Speech:  Normal Rate  Volume:  Normal  Mood:  Dysphoric  Affect:  NA  Thought Process:  Descriptions of Associations: Intact  Orientation:  Full (Time, Place, and Person)  Thought Content:  Rumination  Suicidal Thoughts:  No  Homicidal Thoughts:  No  Memory:  Immediate;   Good Recent;   Good Remote;   Good  Judgement:  Intact  Insight:  Present  Psychomotor Activity:  NA  Concentration:  Concentration: Good and Attention Span: Good  Recall:  Good  Fund of Knowledge:  Good  Language:  Good  Akathisia:  No  Handed:  Right  AIMS (if indicated):     Assets:  Communication Skills Desire for Improvement Housing Resilience  ADL's:  Intact  Cognition:  WNL  Sleep:   4-5 hrs      Assessment and Plan: Bipolar disorder, depressed type. Insomnia.  Discontinue Rozerem since it does not help his sleep. Patient started therapy with Monique churchfield and also restarted his relationship which he had given break for 5 months. He is hoping things may get better. I recommend to try mirtazapine 15 mg at bedtime and cut down his Wellbutrin to 150 mg to help his insomnia and racing thoughts. Continue Lamictal 200 mg daily. He has no rash, itching. Tremors. Recommended to call us back if he has any question or any concern. Reinforced to have blood work. Follow-up in 4 to 6 weeks.   Follow Up Instructions:    I discussed the assessment and treatment plan with the patient. The patient was provided an opportunity to ask questions and all were answered. The patient agreed  with the plan and demonstrated an understanding of the instructions.   The patient was advised to call back or seek an in-person evaluation if the symptoms worsen or if the condition fails to improve as anticipated.  I provided 20 minutes of non-face-to-face time during this encounter.   Kathlee Nations, MD

## 2019-09-17 ENCOUNTER — Other Ambulatory Visit (HOSPITAL_COMMUNITY): Payer: Self-pay | Admitting: Psychiatry

## 2019-09-17 DIAGNOSIS — F3130 Bipolar disorder, current episode depressed, mild or moderate severity, unspecified: Secondary | ICD-10-CM

## 2019-10-07 ENCOUNTER — Other Ambulatory Visit (HOSPITAL_COMMUNITY): Payer: Self-pay | Admitting: Psychiatry

## 2019-10-07 DIAGNOSIS — G4700 Insomnia, unspecified: Secondary | ICD-10-CM

## 2019-10-16 IMAGING — US ULTRASOUND ABDOMEN LIMITED
1 series · 14 of 25 positions shown · non-contrast
Comparison: Prior MRI/MRCP 04/30/2017

CLINICAL DATA: 58-year-old male with abdominal pain, nausea and
vomiting

EXAM:
ULTRASOUND ABDOMEN LIMITED RIGHT UPPER QUADRANT

[Series 1: ultrasound abdomen limited · 14 of 73 slices shown]
[im 1/73]
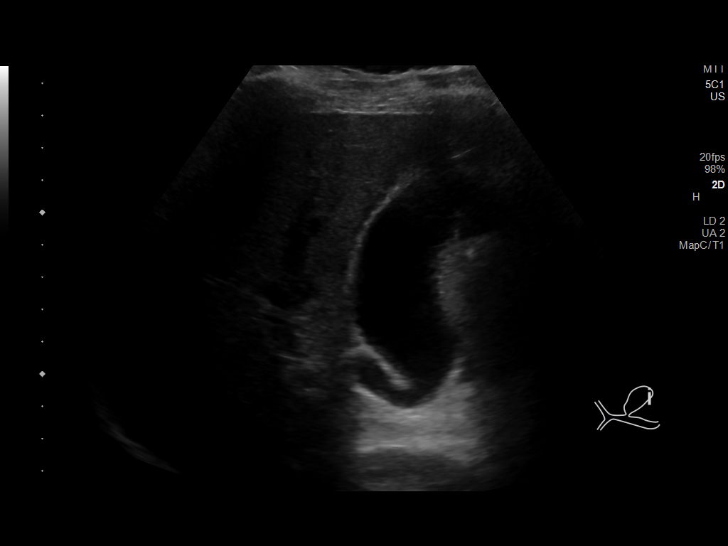
[im 7/73]
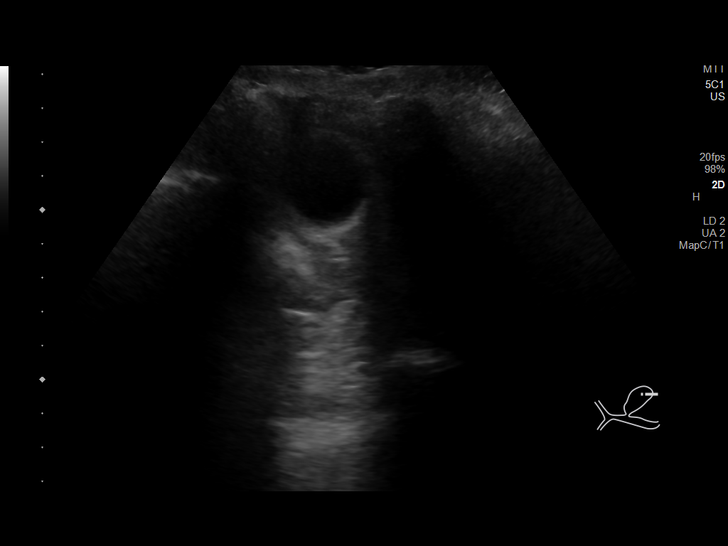
[im 13/73]
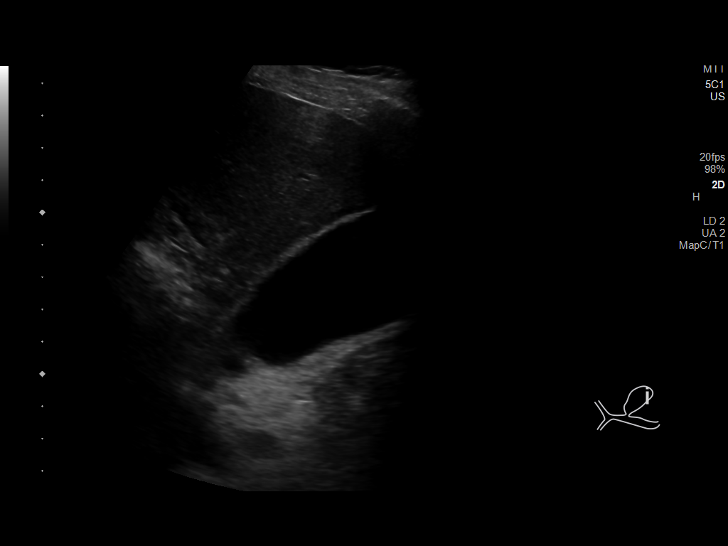
[im 19/73]
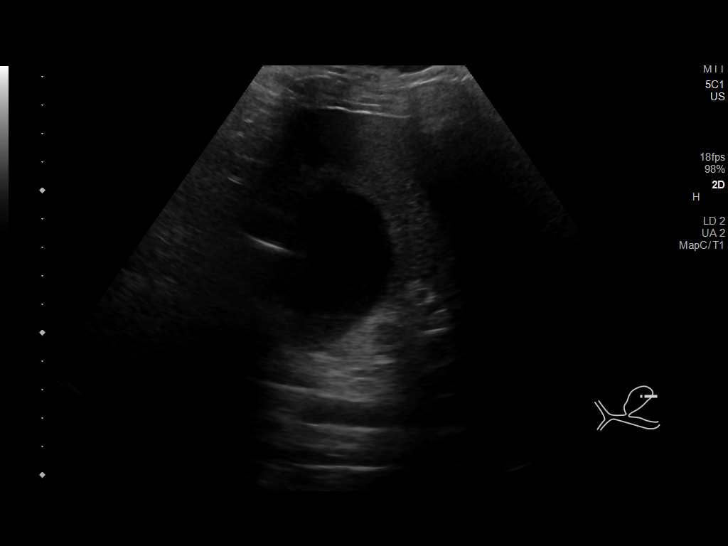
[im 25/73]
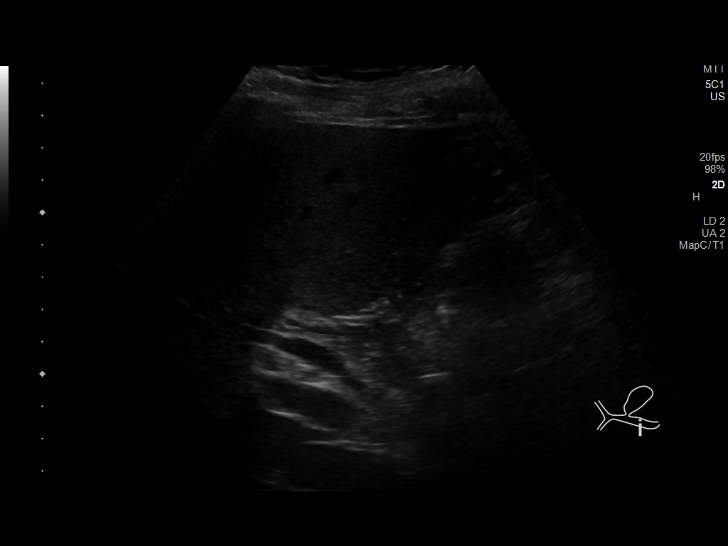
[im 28/73]
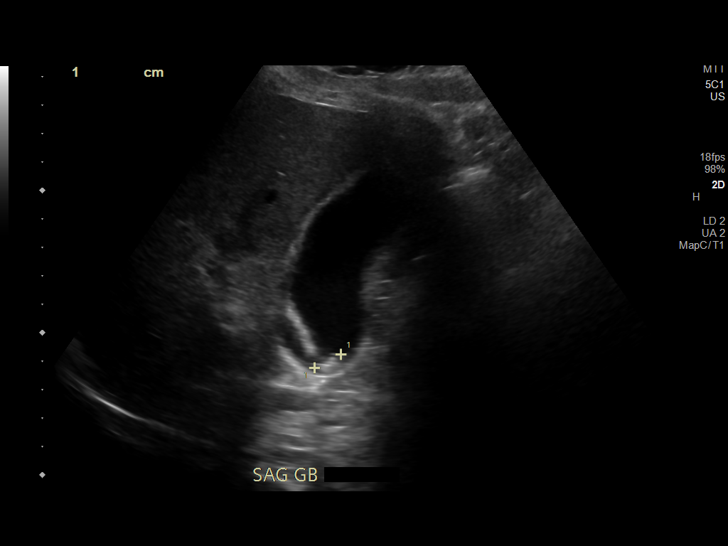
[im 34/73]
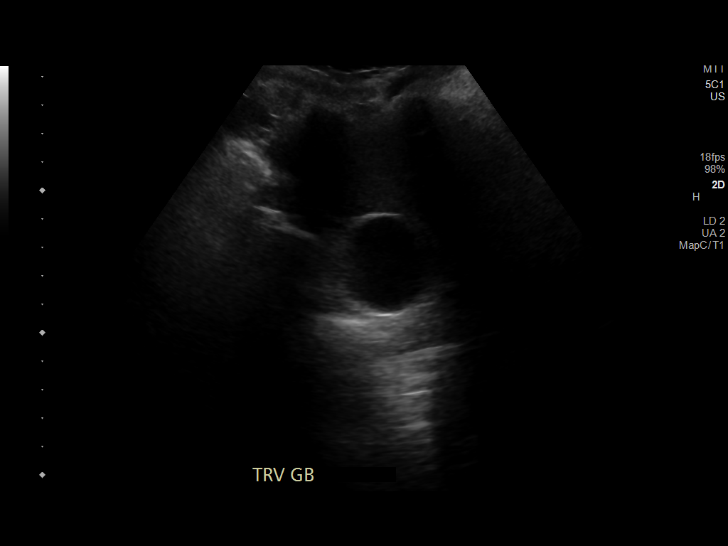
[im 40/73]
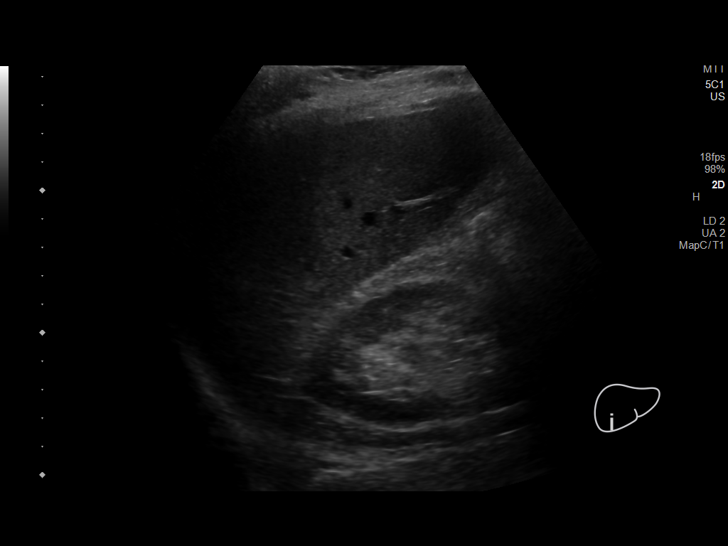
[im 46/73]
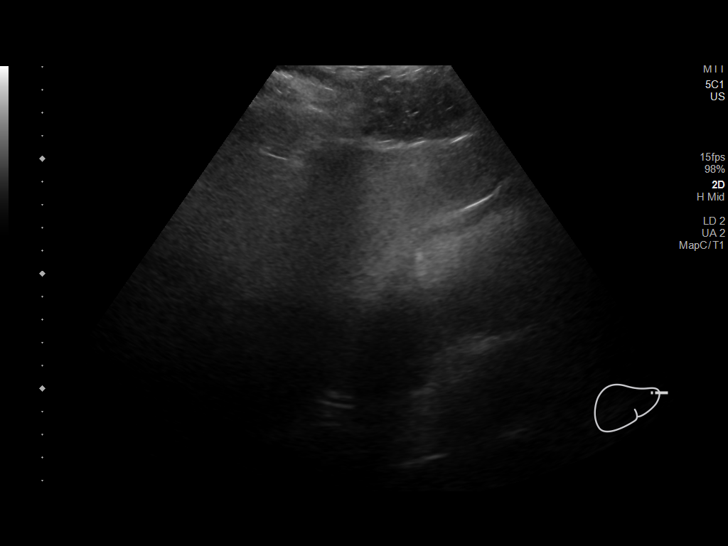
[im 49/73]
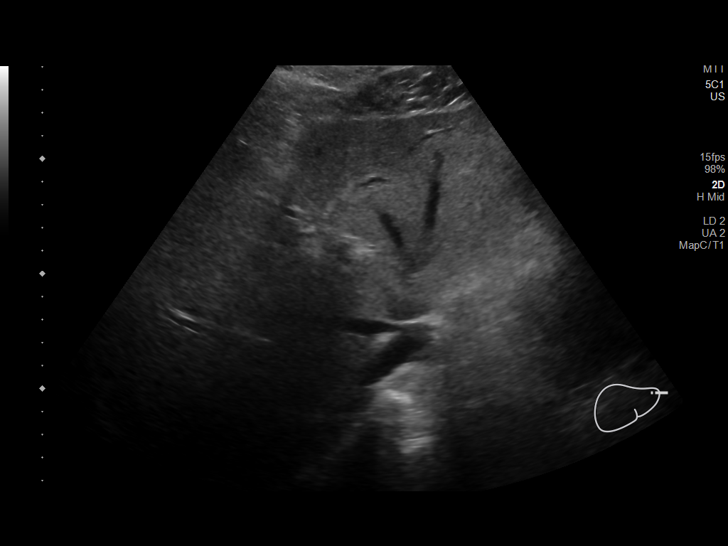
[im 55/73]
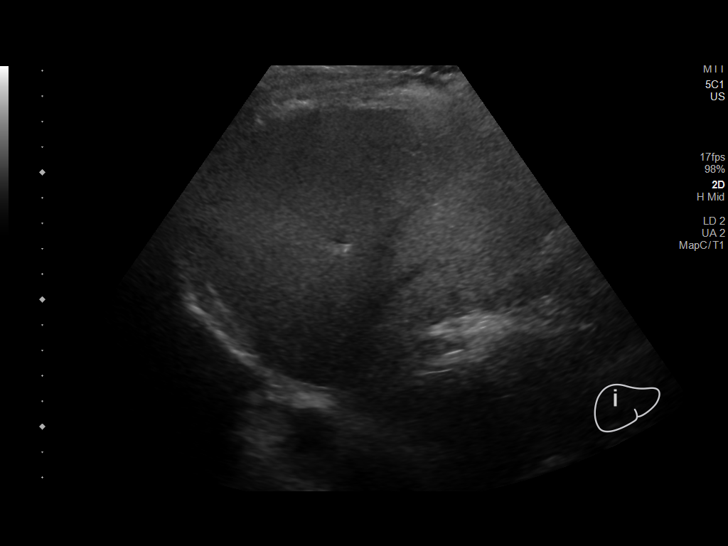
[im 61/73]
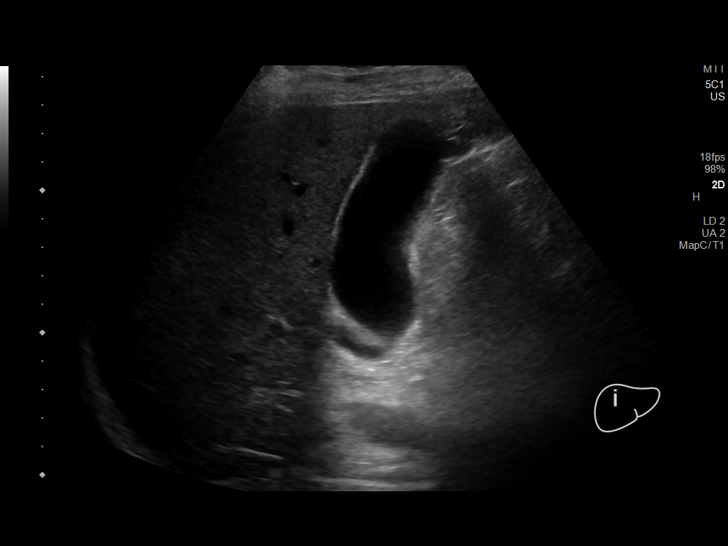
[im 67/73]
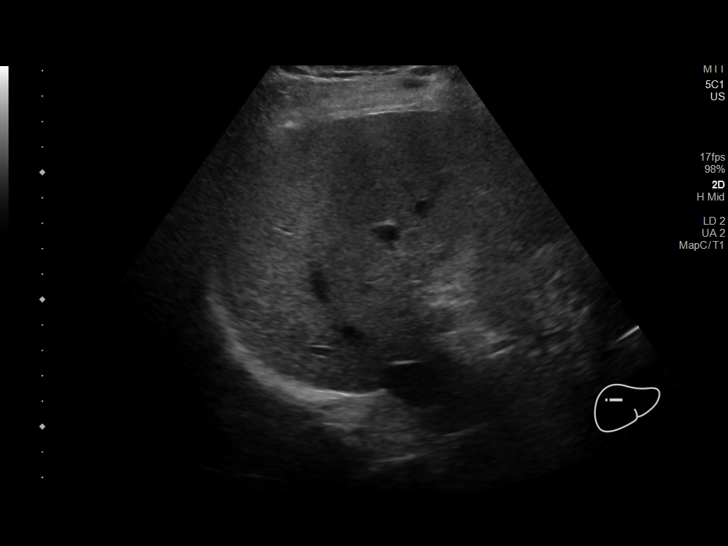
[im 73/73]
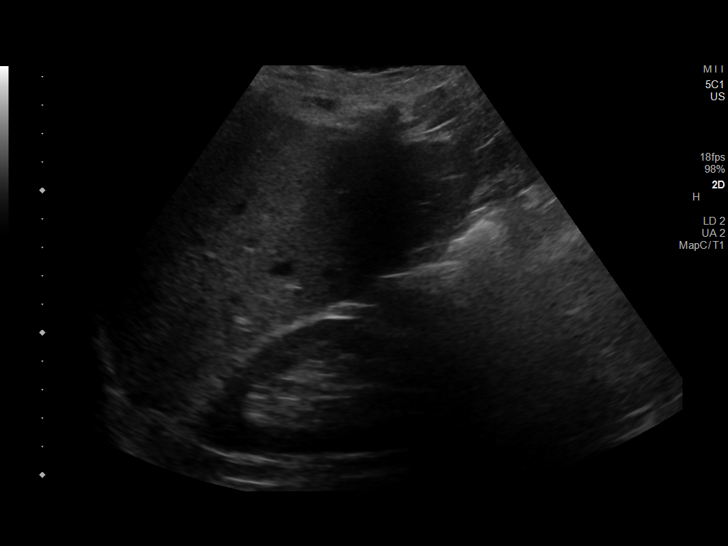

[14 of 25 positions shown; findings below may reference images not displayed]

FINDINGS: Gallbladder:

A 1.4 cm echogenic structure with posterior acoustic shadowing is
present in the gallbladder neck. The structure is immobile. The
gallbladder is otherwise normal in appearance without distention,
gallbladder wall thickening or pericholecystic fluid.

Common bile duct:

Diameter: Within normal limits at 6 mm

Liver:

No focal lesion identified. Within normal limits in parenchymal
echogenicity. Portal vein is patent on color Doppler imaging with
normal direction of blood flow towards the liver.
IMPRESSION: 1.4 cm stone lodged in the gallbladder neck may represent a source
for biliary colic. No secondary sonographic findings to suggest
acute cholecystitis at this time.

## 2019-10-16 IMAGING — DX PORTABLE CHEST - 1 VIEW
1 series · 1 of 1 positions shown · non-contrast
Comparison: Chest radiographs and CT 04/29/2017

CLINICAL DATA: Preop cholecystectomy.

EXAM:
PORTABLE CHEST 1 VIEW

[chest pa]
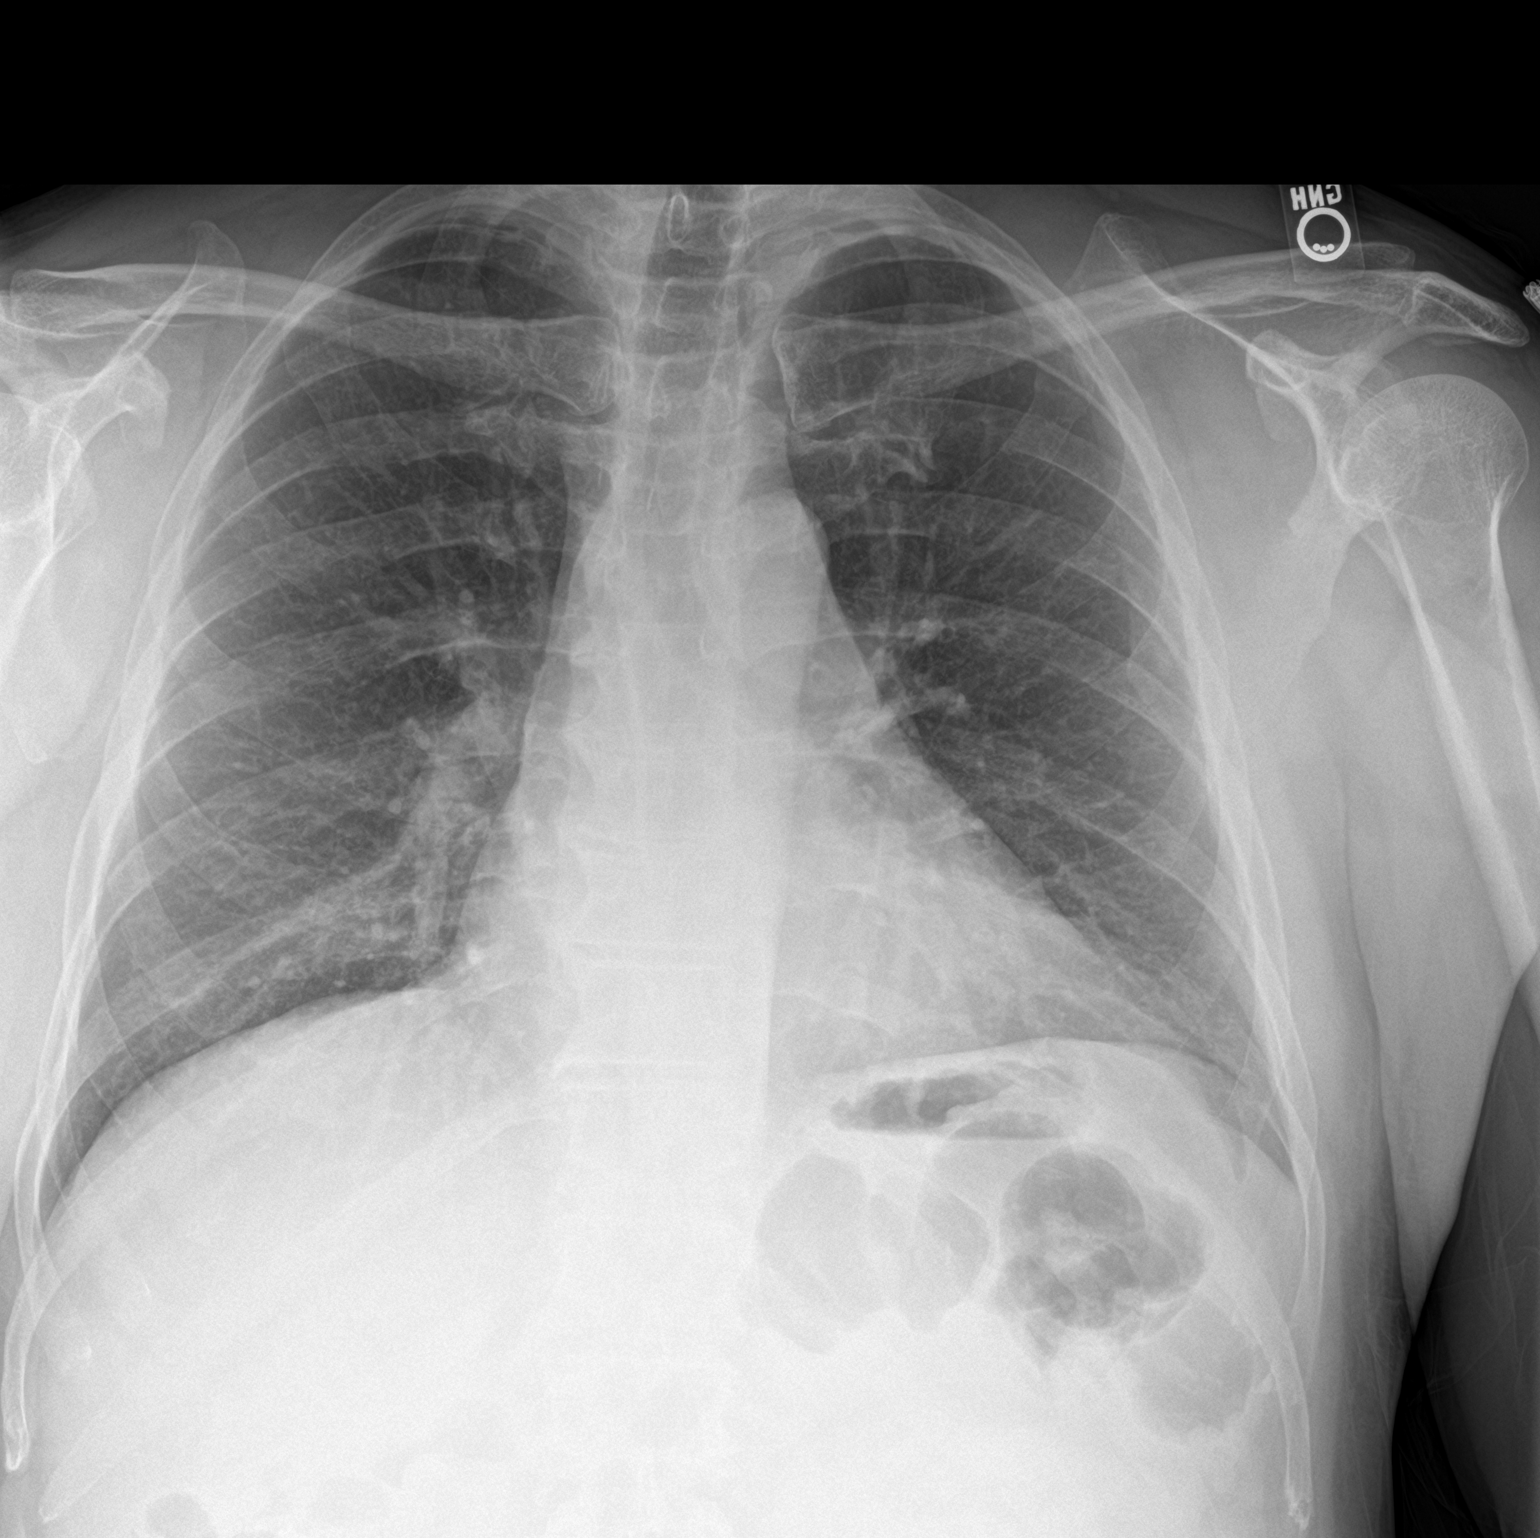

[1 of 1 positions shown; findings below may reference images not displayed]

FINDINGS: The cardiomediastinal silhouette is unchanged with normal heart
size. No airspace consolidation, edema, pleural effusion,
pneumothorax is identified. No acute osseous abnormality is seen.
IMPRESSION: No active disease.

## 2019-10-16 IMAGING — RF INTRAOPERATIVE CHOLANGIOGRAM
1 series · 4 of 4 positions shown · non-contrast
Comparison: 10/11/2018

CLINICAL DATA: Cholelithiasis

EXAM:
INTRAOPERATIVE CHOLANGIOGRAM
TECHNIQUE: Cholangiographic images from the C-arm fluoroscopic device were
submitted for interpretation post-operatively. Please see the
procedural report for the amount of contrast and the fluoroscopy
time utilized.

[Series 1: run · 4 of 69 frames shown]
[frame 11/69]
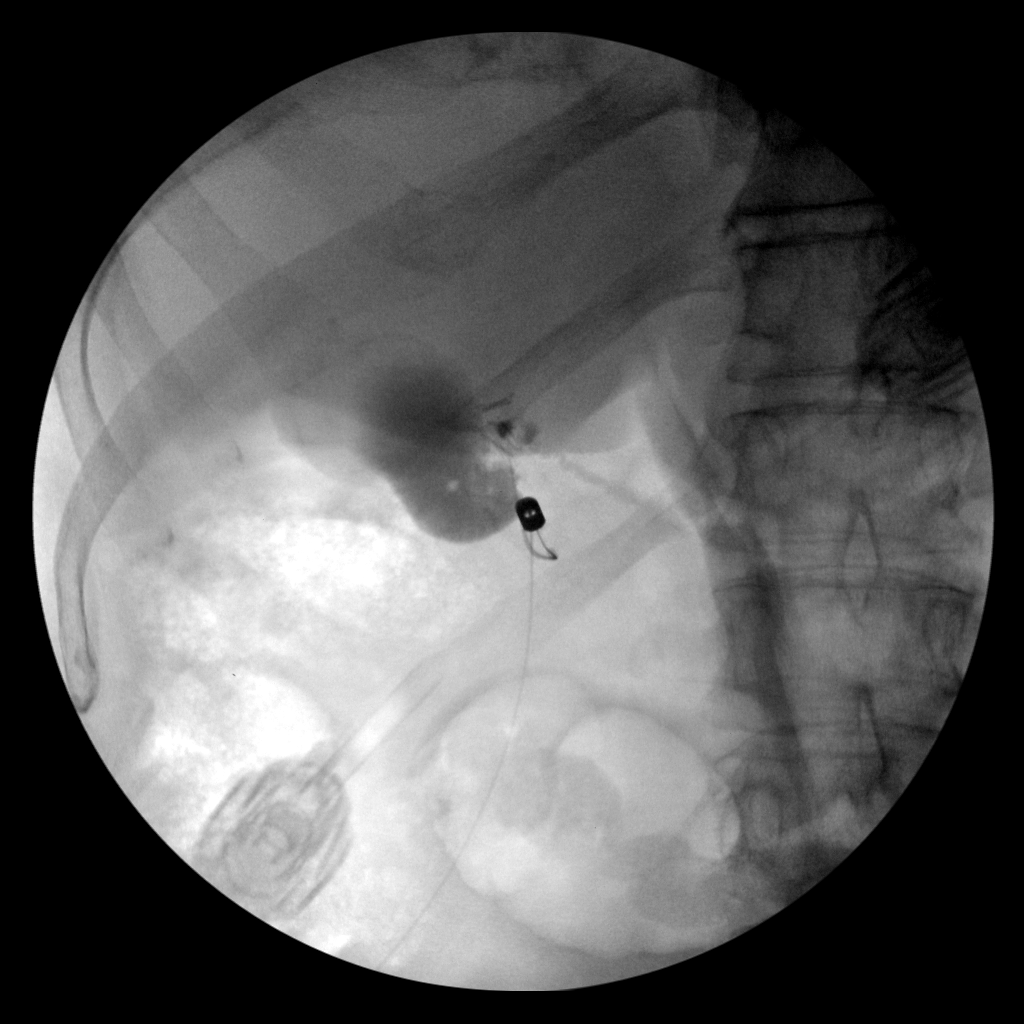
[frame 35/69]
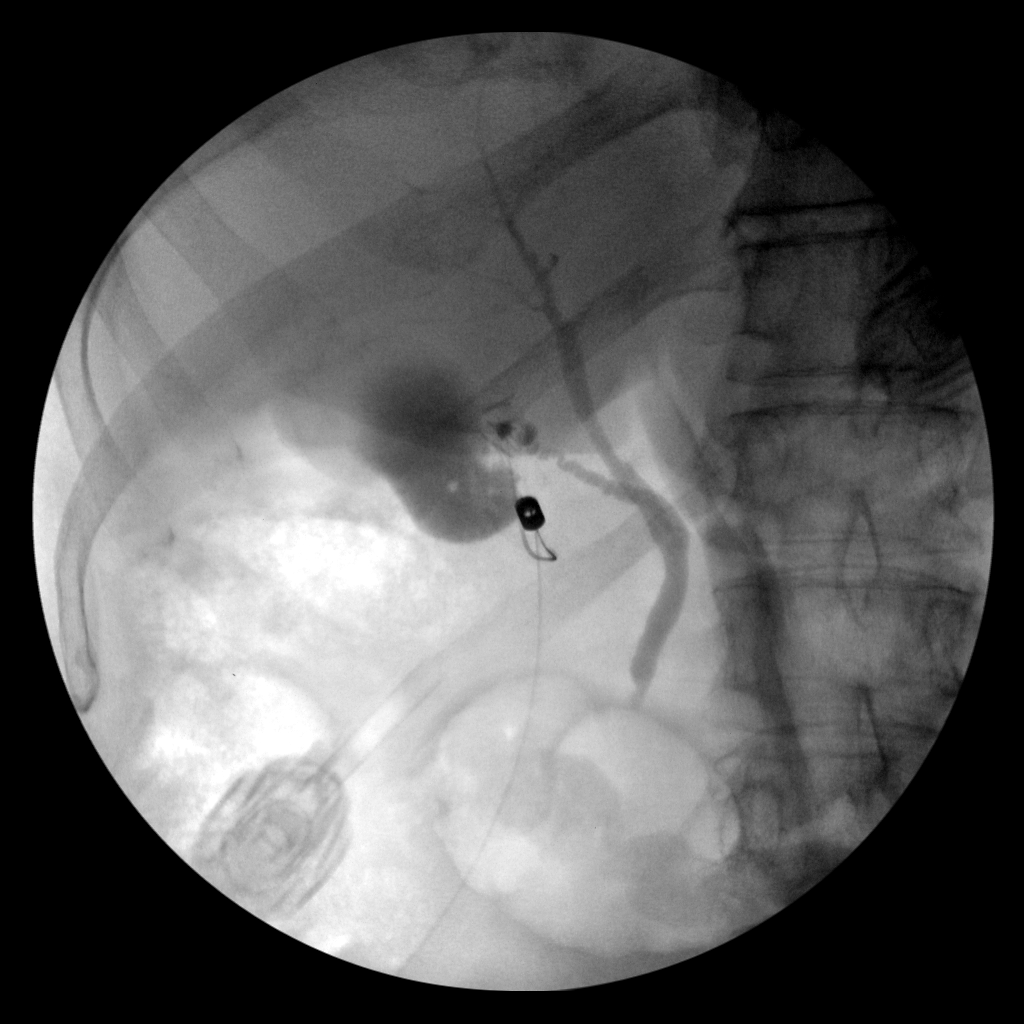
[frame 59/69]
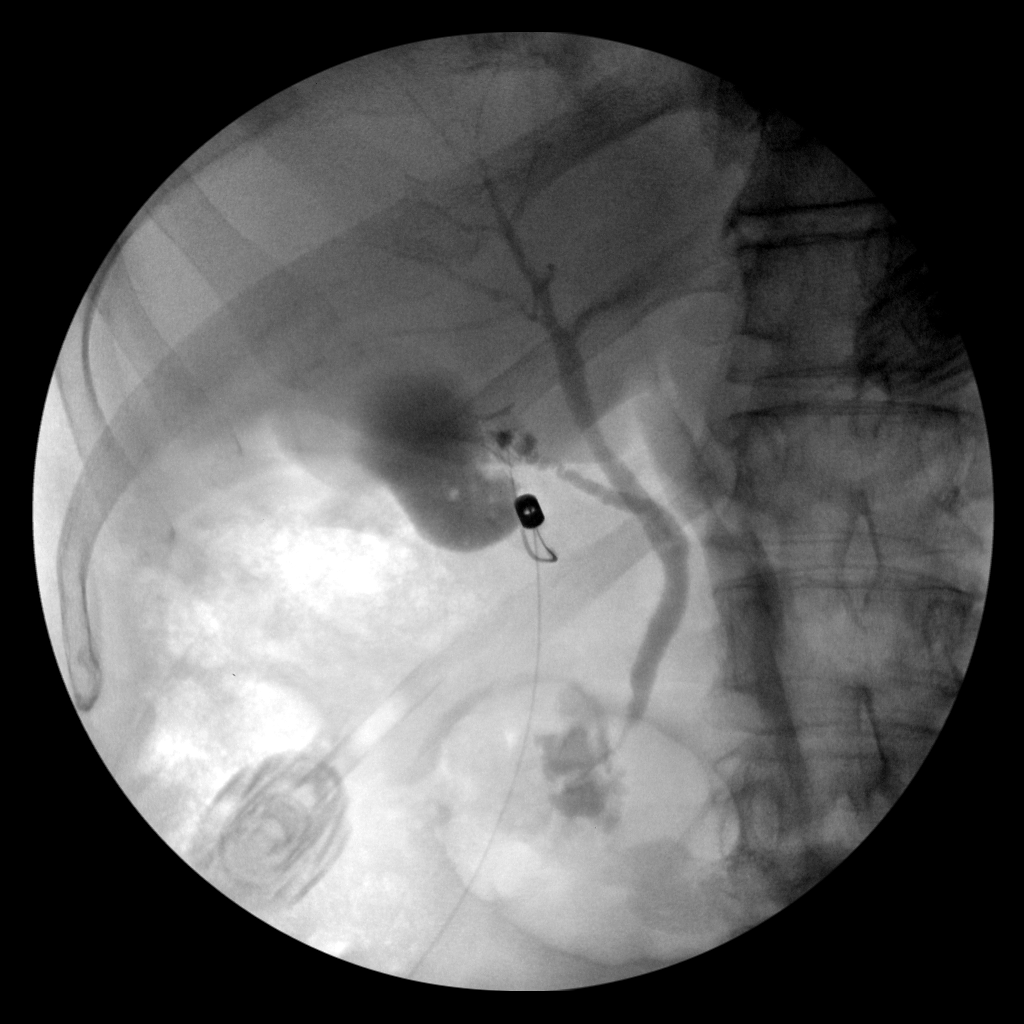
[frame 61/69]
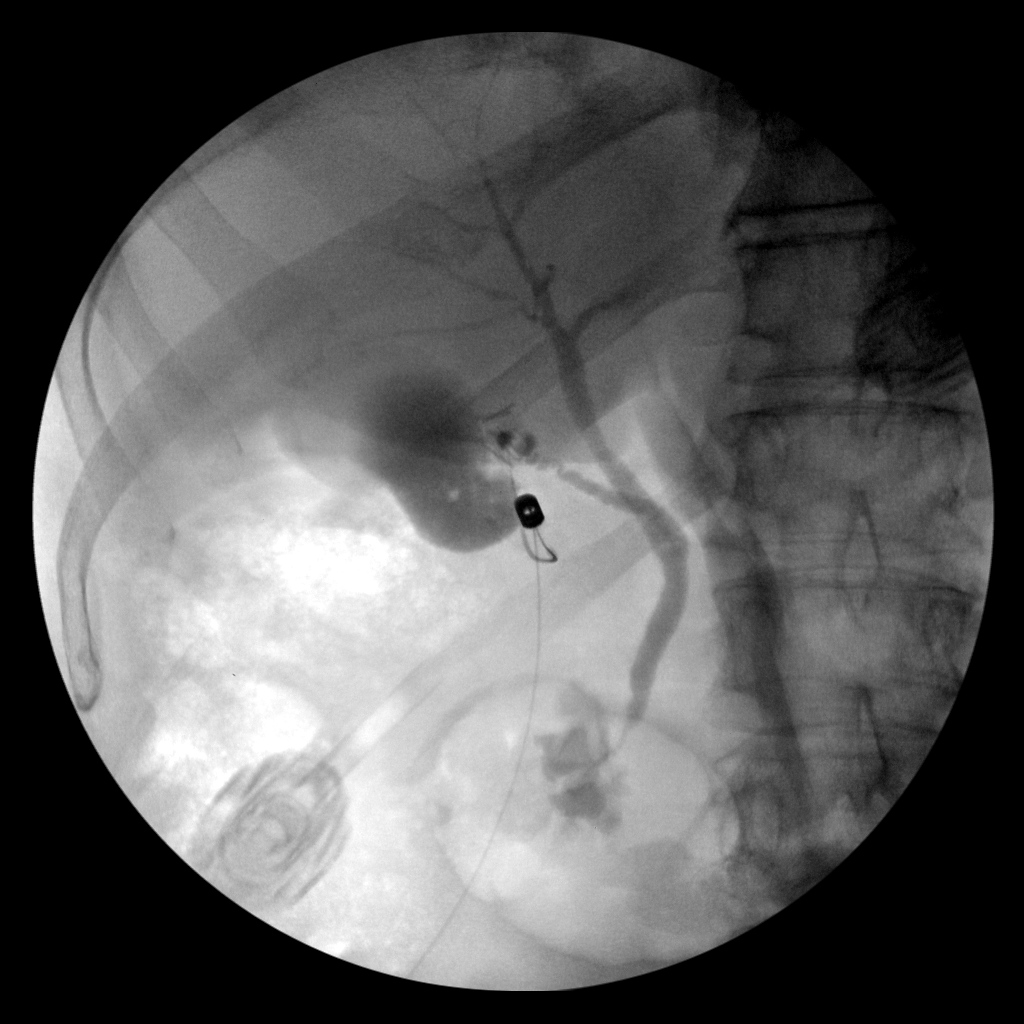

[4 of 4 positions shown; findings below may reference images not displayed]

FINDINGS: Intraoperative cholangiogram performed during the laparoscopic
procedure. Cystic duct has a low insertion on the CBD, normal
variant. The cystic duct, common hepatic duct, and common bile duct
are patent. No significant filling defect, stricture, obstruction,
or dilatation. Contrast drains freely into the duodenum.
IMPRESSION: Patent biliary system.

## 2019-10-20 ENCOUNTER — Other Ambulatory Visit: Payer: Self-pay

## 2019-10-20 ENCOUNTER — Ambulatory Visit (HOSPITAL_COMMUNITY): Payer: 59 | Admitting: Psychiatry

## 2019-12-04 ENCOUNTER — Other Ambulatory Visit (HOSPITAL_COMMUNITY): Payer: Self-pay | Admitting: Psychiatry

## 2019-12-04 DIAGNOSIS — F3130 Bipolar disorder, current episode depressed, mild or moderate severity, unspecified: Secondary | ICD-10-CM

## 2019-12-30 ENCOUNTER — Other Ambulatory Visit (HOSPITAL_COMMUNITY): Payer: Self-pay | Admitting: Psychiatry

## 2019-12-30 DIAGNOSIS — F3130 Bipolar disorder, current episode depressed, mild or moderate severity, unspecified: Secondary | ICD-10-CM

## 2020-01-06 ENCOUNTER — Other Ambulatory Visit (HOSPITAL_COMMUNITY): Payer: Self-pay | Admitting: *Deleted

## 2020-01-06 DIAGNOSIS — F3130 Bipolar disorder, current episode depressed, mild or moderate severity, unspecified: Secondary | ICD-10-CM

## 2020-01-06 MED ORDER — LAMOTRIGINE 200 MG PO TABS: 200.0000 mg | ORAL_TABLET | Freq: Every day | ORAL | 0 refills | Status: DC

## 2020-01-12 ENCOUNTER — Telehealth (HOSPITAL_COMMUNITY): Payer: 59 | Admitting: Psychiatry

## 2020-02-23 ENCOUNTER — Other Ambulatory Visit: Payer: Self-pay

## 2020-02-23 ENCOUNTER — Telehealth (INDEPENDENT_AMBULATORY_CARE_PROVIDER_SITE_OTHER): Payer: 59 | Admitting: Psychiatry

## 2020-02-23 ENCOUNTER — Encounter (HOSPITAL_COMMUNITY): Payer: Self-pay | Admitting: Psychiatry

## 2020-02-23 DIAGNOSIS — F3130 Bipolar disorder, current episode depressed, mild or moderate severity, unspecified: Secondary | ICD-10-CM

## 2020-02-23 DIAGNOSIS — G4700 Insomnia, unspecified: Secondary | ICD-10-CM | POA: Diagnosis not present

## 2020-02-23 MED ORDER — BUPROPION HCL ER (XL) 150 MG PO TB24: 150.0000 mg | ORAL_TABLET | Freq: Every day | ORAL | 0 refills | Status: DC

## 2020-02-23 MED ORDER — LAMOTRIGINE 200 MG PO TABS: 200.0000 mg | ORAL_TABLET | Freq: Every day | ORAL | 0 refills | Status: DC

## 2020-02-23 MED ORDER — MIRTAZAPINE 15 MG PO TABS: 15.0000 mg | ORAL_TABLET | ORAL | 0 refills | Status: DC | PRN

## 2020-02-23 NOTE — Progress Notes (Signed)
Virtual Visit via Telephone Note  I connected with Noel Christmas on 02/23/20 at  9:20 AM EDT by telephone and verified that I am speaking with the correct person using two identifiers.  Location: Patient: home Provider: home office   I discussed the limitations, risks, security and privacy concerns of performing an evaluation and management service by telephone and the availability of in person appointments. I also discussed with the patient that there may be a patient responsible charge related to this service. The patient expressed understanding and agreed to proceed.   History of Present Illness: Patient is evaluated by phone session. He had missed last appointment because he was out of town and he apologized for missing appointment. We started him on mirtazapine 15 mg and he feels it is helping him a lot but he does not take it every night and only take half if needed. His sleep is improved. He also cut down his Wellbutrin to 150. 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 He also not able to see his therapist Monique Churchfield in a while. But overall he feels his anxiety, depression, mood swings is a stable. He denies any anger, mania, racing thoughts or any agitation. His energy level is good. His appetite is okay and his weight is stable. He has no rash or any itching. He like to keep his current medication is helping his mood.  Past Psychiatric History:SawDr. Jennelle Human few years ago.Diagnosed with bipolar and depression. Given Lamictal and Wellbutrin. Rozerem did not work. PCP tried Prozac,Paxil(sexual side effects) and trazodone (did not work). No h/osuicidal attempt, psychosis and inpatient treatment.   Psychiatric Specialty Exam: Physical Exam  Review of Systems  Weight 200 lb (90.7 kg).There is no height or weight on file to calculate BMI.  General  Appearance: NA  Eye Contact:  NA  Speech:  Clear and Coherent and Normal Rate  Volume:  Normal  Mood:  Euthymic  Affect:  NA  Thought Process:  Goal Directed  Orientation:  Full (Time, Place, and Person)  Thought Content:  Logical  Suicidal Thoughts:  No  Homicidal Thoughts:  No  Memory:  Immediate;   Good Recent;   Good Remote;   Good  Judgement:  Good  Insight:  Present  Psychomotor Activity:  NA  Concentration:  Concentration: Good and Attention Span: Good  Recall:  Good  Fund of Knowledge:  Good  Language:  Good  Akathisia:  No  Handed:  Right  AIMS (if indicated):     Assets:  Communication Skills Desire for Improvement Housing Resilience Talents/Skills Transportation  ADL's:  Intact  Cognition:  WNL  Sleep:   improved      Assessment and Plan: Bipolar disorder, depressed type. Insomnia.  Patient is a stable on his current medication. He is taking mirtazapine 15 mg as needed but some nights he take half tablet. He like to continue all his medication. We will continue Wellbutrin XL 150 mg in the morning and Lamictal 200 mg daily. At this time he does not feel he need to go back to therapy since he has been very busy and doing well. Discussed medication side effects and benefits. Recommended to call us back if there is any question or any concern. Follow-up in 3 months.    Follow Up Instructions:    I discussed the assessment and treatment  plan with the patient. The patient was provided an opportunity to ask questions and all were answered. The patient agreed with the plan and demonstrated an understanding of the instructions.   The patient was advised to call back or seek an in-person evaluation if the symptoms worsen or if the condition fails to improve as anticipated.  I provided 20 minutes of non-face-to-face time during this encounter.   Cleotis Nipper, MD

## 2020-05-03 ENCOUNTER — Other Ambulatory Visit (HOSPITAL_COMMUNITY): Payer: Self-pay | Admitting: Psychiatry

## 2020-05-03 DIAGNOSIS — F3130 Bipolar disorder, current episode depressed, mild or moderate severity, unspecified: Secondary | ICD-10-CM

## 2020-05-05 ENCOUNTER — Telehealth (HOSPITAL_COMMUNITY): Payer: Self-pay | Admitting: *Deleted

## 2020-05-05 DIAGNOSIS — G4700 Insomnia, unspecified: Secondary | ICD-10-CM

## 2020-05-05 NOTE — Telephone Encounter (Signed)
Patient called and requested refill of Remeron. His next appt is 10/26. Please review.

## 2020-05-06 MED ORDER — MIRTAZAPINE 15 MG PO TABS: 15.0000 mg | ORAL_TABLET | ORAL | 0 refills | Status: DC | PRN

## 2020-05-06 NOTE — Telephone Encounter (Signed)
Done

## 2020-05-21 ENCOUNTER — Other Ambulatory Visit (HOSPITAL_COMMUNITY): Payer: Self-pay | Admitting: Psychiatry

## 2020-05-21 DIAGNOSIS — F3130 Bipolar disorder, current episode depressed, mild or moderate severity, unspecified: Secondary | ICD-10-CM

## 2020-05-25 ENCOUNTER — Encounter (HOSPITAL_COMMUNITY): Payer: 59 | Admitting: Psychiatry

## 2020-05-25 ENCOUNTER — Other Ambulatory Visit: Payer: Self-pay

## 2020-05-25 ENCOUNTER — Other Ambulatory Visit (HOSPITAL_COMMUNITY): Payer: Self-pay | Admitting: *Deleted

## 2020-05-25 DIAGNOSIS — F3130 Bipolar disorder, current episode depressed, mild or moderate severity, unspecified: Secondary | ICD-10-CM

## 2020-05-25 MED ORDER — BUPROPION HCL ER (XL) 150 MG PO TB24: 150.0000 mg | ORAL_TABLET | Freq: Every day | ORAL | 0 refills | Status: DC

## 2020-05-25 NOTE — Progress Notes (Signed)
No show

## 2020-06-14 ENCOUNTER — Other Ambulatory Visit (HOSPITAL_COMMUNITY): Payer: Self-pay | Admitting: Psychiatry

## 2020-06-14 ENCOUNTER — Encounter (HOSPITAL_COMMUNITY): Payer: Self-pay | Admitting: Psychiatry

## 2020-06-14 ENCOUNTER — Telehealth (INDEPENDENT_AMBULATORY_CARE_PROVIDER_SITE_OTHER): Payer: 59 | Admitting: Psychiatry

## 2020-06-14 ENCOUNTER — Other Ambulatory Visit: Payer: Self-pay

## 2020-06-14 VITALS — Wt 200.0 lb

## 2020-06-14 DIAGNOSIS — G4700 Insomnia, unspecified: Secondary | ICD-10-CM

## 2020-06-14 DIAGNOSIS — F3130 Bipolar disorder, current episode depressed, mild or moderate severity, unspecified: Secondary | ICD-10-CM

## 2020-06-14 MED ORDER — MIRTAZAPINE 15 MG PO TABS: 15.0000 mg | ORAL_TABLET | ORAL | 1 refills | Status: DC | PRN

## 2020-06-14 MED ORDER — BUPROPION HCL ER (XL) 150 MG PO TB24: 150.0000 mg | ORAL_TABLET | Freq: Every day | ORAL | 0 refills | Status: DC

## 2020-06-14 MED ORDER — ARIPIPRAZOLE 2 MG PO TABS: 2.0000 mg | ORAL_TABLET | Freq: Every day | ORAL | 1 refills | Status: DC

## 2020-06-14 MED ORDER — LAMOTRIGINE 200 MG PO TABS: 200.0000 mg | ORAL_TABLET | Freq: Every day | ORAL | 0 refills | Status: DC

## 2020-06-14 NOTE — Progress Notes (Signed)
Virtual Visit via Telephone Note  I connected with Noel Christmas on 06/14/20 at  9:20 AM EST by telephone and verified that I am speaking with the correct person using two identifiers.  Location: Patient: Home Provider: Home office   I discussed the limitations, risks, security and privacy concerns of performing an evaluation and management service by telephone and the availability of in person appointments. I also discussed with the patient that there may be a patient responsible charge related to this service. The patient expressed understanding and agreed to proceed.   History of Present Illness: Patient is evaluated by phone session.  He had missed last appointment because he was out of town but able to get.  He feels and he is taking the medication as prescribed.  He noticed lately more sad and depressed.  He is not sure why but usually around holidays he do get into depressive episodes.  Patient told in the past he had break-up around Christmas time and does not like this time of year.  He has been more sad, isolated and withdrawn.  His appetite is low but his weight is stable.  He is sleeping good and takes half of the mirtazapine which helps his sleep.  He is a Naval architect.  Patient reported his relationship is going okay.  He had seen television advertisement of Abilify and he like to try as a supplement to help his depression.  He is taking Wellbutrin, Lamictal and reported no tremors, rash, itching shakes or any EPS.  Denies any mania, agitation, aggression or any suicidal thoughts but he admitted feeling dysphoria and lack of motivation to do things.  Past Psychiatric History: SawDr. Jennelle Human in past and diagnosed with bipolar and depression.GivenLamictal and Wellbutrin. Rozerem did not work.PCP tried Prozac,Paxil(sexual side effects) and trazodone (did not work). No h/osuicidal attempt, psychosis and inpatient treatment.   Psychiatric Specialty Exam: Physical Exam  Review of  Systems  Weight 200 lb (90.7 kg).There is no height or weight on file to calculate BMI.  General Appearance: NA  Eye Contact:  NA  Speech:  Normal Rate  Volume:  Normal  Mood:  Depressed  Affect:  NA  Thought Process:  Descriptions of Associations: Intact  Orientation:  Full (Time, Place, and Person)  Thought Content:  Rumination  Suicidal Thoughts:  No  Homicidal Thoughts:  No  Memory:  Immediate;   Good Recent;   Good Remote;   Good  Judgement:  Intact  Insight:  Present  Psychomotor Activity:  NA  Concentration:  Concentration: Good and Attention Span: Good  Recall:  Good  Fund of Knowledge:  Good  Language:  Good  Akathisia:  No  Handed:  Right  AIMS (if indicated):     Assets:  Communication Skills Desire for Improvement Housing Resilience Transportation  ADL's:  Intact  Cognition:  WNL  Sleep:   ok      Assessment and Plan: Bipolar disorder, depressed type.  Insomnia.  We talked about trying Abilify low-dose to help as a supplement for his mood symptoms.  We discussed antipsychotic side effects, metabolic syndrome and EPS.  He agreed to give a try.  We will try 2 mg Abilify but at this time I recommend to keep all his medication and if Abilify works we may consider adjusting the dose of Abilify and reducing the Lamictal.  He agreed with the plan.  I also encouraged to resume therapy with Monique Churchfield for counseling.  Continue Wellbutrin XL 150 mg in the  morning, Lamictal 200 mg daily, mirtazapine 15 mg half to 1 tablet as needed and we will start Abilify 2 mg daily.  Recommended to call us back if is any question or any concern.  Follow-up in 6 weeks.  Follow Up Instructions:    I discussed the assessment and treatment plan with the patient. The patient was provided an opportunity to ask questions and all were answered. The patient agreed with the plan and demonstrated an understanding of the instructions.   The patient was advised to call back or seek an  in-person evaluation if the symptoms worsen or if the condition fails to improve as anticipated.  I provided 16 minutes of non-face-to-face time during this encounter.   Cleotis Nipper, MD

## 2020-06-21 ENCOUNTER — Other Ambulatory Visit (HOSPITAL_COMMUNITY): Payer: Self-pay | Admitting: Psychiatry

## 2020-06-21 DIAGNOSIS — F3130 Bipolar disorder, current episode depressed, mild or moderate severity, unspecified: Secondary | ICD-10-CM

## 2020-07-27 ENCOUNTER — Telehealth (HOSPITAL_COMMUNITY): Payer: 59 | Admitting: Psychiatry

## 2020-07-27 ENCOUNTER — Other Ambulatory Visit: Payer: Self-pay

## 2020-08-09 ENCOUNTER — Telehealth (INDEPENDENT_AMBULATORY_CARE_PROVIDER_SITE_OTHER): Payer: 59 | Admitting: Psychiatry

## 2020-08-09 ENCOUNTER — Encounter (HOSPITAL_COMMUNITY): Payer: Self-pay | Admitting: Psychiatry

## 2020-08-09 ENCOUNTER — Other Ambulatory Visit: Payer: Self-pay

## 2020-08-09 DIAGNOSIS — G4700 Insomnia, unspecified: Secondary | ICD-10-CM | POA: Diagnosis not present

## 2020-08-09 DIAGNOSIS — F3130 Bipolar disorder, current episode depressed, mild or moderate severity, unspecified: Secondary | ICD-10-CM | POA: Diagnosis not present

## 2020-08-09 MED ORDER — MIRTAZAPINE 15 MG PO TABS: 15.0000 mg | ORAL_TABLET | ORAL | 1 refills | Status: DC | PRN

## 2020-08-09 MED ORDER — BUPROPION HCL ER (XL) 150 MG PO TB24: 150.0000 mg | ORAL_TABLET | Freq: Every day | ORAL | 0 refills | Status: DC

## 2020-08-09 MED ORDER — LAMOTRIGINE 200 MG PO TABS: 200.0000 mg | ORAL_TABLET | Freq: Every day | ORAL | 0 refills | Status: DC

## 2020-08-09 NOTE — Progress Notes (Signed)
Virtual Visit via Telephone Note  I connected with Douglas Hester on 08/09/20 at 10:40 AM EST by telephone and verified that I am speaking with the correct person using two identifiers.  Location: Patient: Home Provider: Home Office   I discussed the limitations, risks, security and privacy concerns of performing an evaluation and management service by telephone and the availability of in person appointments. I also discussed with the patient that there may be a patient responsible charge related to this service. The patient expressed understanding and agreed to proceed.   History of Present Illness: Patient is evaluated by phone session.  He is feeling better since Hester is over.  He did not pick up the Abilify because it was backordered and when pharmacy called to pick up he was feeling better.  He is sleeping good.  He is still take mirtazapine half tablet and sometimes 1 tablet as needed for sleep.  He feels his thinking is much clearer and he is not as depressed and anxious.  He denies any mania, psychosis or any hallucination.  Since holidays are over and he is back to his normal schedule he is feeling much better.  He does not feel he need Abilify.  He is taking Wellbutrin, Lamictal that is helping his mood and anxiety.  He has no rash, itching, tremors or shakes.  His energy level is good.  His appetite is okay.  He has no rash or any itching.  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  He does not want to change the medication since it is working.     Past Psychiatric History: SawDr. Jennelle Human in past and diagnosed with bipolar and depression.GivenLamictal and Wellbutrin. Rozerem did not work.PCP tried Prozac,Paxil(sexual side effects) and trazodone (did not work). No h/osuicidal attempt, psychosis and inpatient treatment.  Psychiatric Specialty Exam: Physical Exam  Review of Systems  Weight 200 lb (90.7 kg).There is no height or weight on file  to calculate BMI.  General Appearance: NA  Eye Contact:  NA  Speech:  Normal Rate  Volume:  Normal  Mood:  Euthymic  Affect:  NA  Thought Process:  Goal Directed  Orientation:  Full (Time, Place, and Person)  Thought Content:  WDL  Suicidal Thoughts:  No  Homicidal Thoughts:  No  Memory:  Immediate;   Good Recent;   Good Remote;   Good  Judgement:  Good  Insight:  Good  Psychomotor Activity:  NA  Concentration:  Concentration: Good and Attention Span: Good  Recall:  Good  Fund of Knowledge:  Good  Language:  Good  Akathisia:  No  Handed:  Right  AIMS (if indicated):     Assets:  Communication Skills Desire for Improvement Housing Resilience Talents/Skills Transportation  ADL's:  Intact  Cognition:  WNL  Sleep:   ok      Assessment and Plan: Bipolar disorder, depressed type.  Insomnia.  Patient did not pick up Abilify since he noticed feeling better after the holidays.  He is stable.  He like to keep his current medication and so far he has no side effects.  I will continue Wellbutrin XL 150 mg in the morning, Lamictal 200 mg daily and mirtazapine 15 mg to take half to 1 tablet as needed for insomnia.  Discussed medication side effects and benefits.  Recommended to call us back if he has any question or any concern.  Follow-up in 3 months.  Follow Up Instructions:  I discussed the assessment and treatment plan with the patient. The patient was provided an opportunity to ask questions and all were answered. The patient agreed with the plan and demonstrated an understanding of the instructions.   The patient was advised to call back or seek an in-person evaluation if the symptoms worsen or if the condition fails to improve as anticipated.  I provided 16 minutes of non-face-to-face time during this encounter.   Kathlee Nations, MD

## 2020-11-01 ENCOUNTER — Other Ambulatory Visit: Payer: Self-pay

## 2020-11-01 ENCOUNTER — Telehealth (INDEPENDENT_AMBULATORY_CARE_PROVIDER_SITE_OTHER): Payer: 59 | Admitting: Psychiatry

## 2020-11-01 ENCOUNTER — Encounter (HOSPITAL_COMMUNITY): Payer: Self-pay | Admitting: Psychiatry

## 2020-11-01 DIAGNOSIS — F3130 Bipolar disorder, current episode depressed, mild or moderate severity, unspecified: Secondary | ICD-10-CM | POA: Diagnosis not present

## 2020-11-01 DIAGNOSIS — G4700 Insomnia, unspecified: Secondary | ICD-10-CM

## 2020-11-01 MED ORDER — MIRTAZAPINE 15 MG PO TABS: 15.0000 mg | ORAL_TABLET | ORAL | 1 refills | Status: DC | PRN

## 2020-11-01 MED ORDER — LAMOTRIGINE 200 MG PO TABS: 200.0000 mg | ORAL_TABLET | Freq: Every day | ORAL | 0 refills | Status: DC

## 2020-11-01 MED ORDER — BUPROPION HCL ER (XL) 150 MG PO TB24
150.0000 mg | ORAL_TABLET | Freq: Every day | ORAL | 0 refills | Status: DC
Start: 2020-11-01 — End: 2021-01-24

## 2020-11-01 NOTE — Progress Notes (Signed)
Virtual Visit via Telephone Note  I connected with Douglas Hester on 11/01/20 at  9:20 AM EDT by telephone and verified that I am speaking with the correct person using two identifiers.  Location: Patient: Home Provider: Home Office   I discussed the limitations, risks, security and privacy concerns of performing an evaluation and management service by telephone and the availability of in person appointments. I also discussed with the patient that there may be a patient responsible charge related to this service. The patient expressed understanding and agreed to proceed.   History of Present Illness: Patient is evaluated by phone session.  He had shoulder pain and he was given some muscle relaxant but he stopped taking as he feeling somewhat better.  He was recommended to see Ortho.  But he does not want to go because he feels pain is not as bad.  However he is admitted if pain started to come back and he may need to do further evaluation.  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  He admitted his 80-month-old relationship ended because he felt it was toxic and he is back to single.  He is not sure if he is interested in any new relationship at this time.  He had a good support from his nephew, niece, brother who lives in [place] and close friend who he talked to regularly.  He admitted few pound weight gain because he was not active but since his shoulder pain is better he is feeling more active.  He has no tremors, shakes, EPS.  He has no rash or any itching.  He never picked up the Abilify which was given 3 months ago.  He feels he does not need new medication since combination is working well.  He admitted taking the Remeron half tablet but taking it every night.   Past Psychiatric History: SawDr. Cottlein past and diagnosed with bipolar and depression.GivenLamictal and Wellbutrin. Rozerem did not work.PCP tried Prozac,Paxil(sexual side effects) and trazodone (did  not work). No h/osuicidal attempt, psychosis and inpatient treatment.   Psychiatric Specialty Exam: Physical Exam  Review of Systems  Weight 210 lb (95.3 kg).There is no height or weight on file to calculate BMI.  General Appearance: NA  Eye Contact:  NA  Speech:  Normal Rate  Volume:  Normal  Mood:  Euthymic  Affect:  NA  Thought Process:  Goal Directed  Orientation:  Full (Time, Place, and Person)  Thought Content:  WDL  Suicidal Thoughts:  No  Homicidal Thoughts:  No  Memory:  Immediate;   Good Recent;   Good Remote;   Good  Judgement:  Intact  Insight:  Present  Psychomotor Activity:  NA  Concentration:  Concentration: Good and Attention Span: Good  Recall:  Good  Fund of Knowledge:  Good  Language:  Good  Akathisia:  No  Handed:  Right  AIMS (if indicated):     Assets:  Communication Skills Desire for Improvement Housing Resilience Talents/Skills Transportation  ADL's:  Intact  Cognition:  WNL  Sleep:   ok      Assessment and Plan: Bipolar disorder, depressed type.  Insomnia.  Patient describes his mood is stable and denies any mania, psychosis and is sleeping better.  Continue mirtazapine 15 mg to take half to 1 tablet at bedtime, Wellbutrin XL 150 mg in the morning and lamotrigine 200 mg daily.  Patient is not interested in therapy.  Recommended to call us back  if is any question or any concern.  Follow-up in 3 months.  Follow Up Instructions:    I discussed the assessment and treatment plan with the patient. The patient was provided an opportunity to ask questions and all were answered. The patient agreed with the plan and demonstrated an understanding of the instructions.   The patient was advised to call back or seek an in-person evaluation if the symptoms worsen or if the condition fails to improve as anticipated.  I provided 17 minutes of non-face-to-face time during this encounter.   Cleotis Nipper, MD

## 2021-01-24 ENCOUNTER — Other Ambulatory Visit: Payer: Self-pay

## 2021-01-24 ENCOUNTER — Telehealth (INDEPENDENT_AMBULATORY_CARE_PROVIDER_SITE_OTHER): Payer: 59 | Admitting: Psychiatry

## 2021-01-24 ENCOUNTER — Encounter (HOSPITAL_COMMUNITY): Payer: Self-pay | Admitting: Psychiatry

## 2021-01-24 DIAGNOSIS — G4700 Insomnia, unspecified: Secondary | ICD-10-CM | POA: Diagnosis not present

## 2021-01-24 DIAGNOSIS — F3130 Bipolar disorder, current episode depressed, mild or moderate severity, unspecified: Secondary | ICD-10-CM

## 2021-01-24 MED ORDER — LAMOTRIGINE 200 MG PO TABS: 200.0000 mg | ORAL_TABLET | Freq: Every day | ORAL | 0 refills | Status: DC

## 2021-01-24 MED ORDER — BUPROPION HCL ER (XL) 150 MG PO TB24
150.0000 mg | ORAL_TABLET | Freq: Every day | ORAL | 0 refills | Status: DC
Start: 2021-01-24 — End: 2021-05-09

## 2021-01-24 MED ORDER — MIRTAZAPINE 15 MG PO TABS
15.0000 mg | ORAL_TABLET | ORAL | 1 refills | Status: DC | PRN
Start: 2021-01-24 — End: 2021-05-09

## 2021-01-24 NOTE — Progress Notes (Signed)
Virtual Visit via Telephone Note  I connected with Douglas Hester on 01/24/21 at  8:20 AM EDT by telephone and verified that I am speaking with the correct person using two identifiers.  Location: Patient: In Car Provider: Home Office   I discussed the limitations, risks, security and privacy concerns of performing an evaluation and management service by telephone and the availability of in person appointments. I also discussed with the patient that there may be a patient responsible charge related to this service. The patient expressed understanding and agreed to proceed.   History of Present Illness: Patient is evaluated by phone session.  He is at veterinary clinic because of his cat checkup.  Patient overall doing much better.  He is happy that he was baptized yesterday and he connected to the church and is feeling much better.  He is sleeping good.  He feels relaxed and reported things are going very well.  Since his relationship ended he is more happy because he felt it was very toxic.  He is now thinking [place] so he can [place] there and get up.  He is sleeping good.  He takes melatonin.  He is back to full-time work but is still have some time shoulder pain intermittent.  He had a good support from the church and also from his family lives in [place] and close friends.  His weight is unchanged from the past.  He like to keep his current medication which is helping his mood, anxiety, depression.  He denies any mania, psychosis, hallucination or any impulsive behavior.  He denies any crying spells or any feeling of hopelessness or worthlessness.  He is taking mirtazapine 15 mg half tablet every night which helps with sleep.  He like to keep his Wellbutrin XL 150 mg in the morning and Lamictal 200 mg daily.  He has no rash, itching tremors or shakes.  Past Psychiatric History:  Saw Dr. Jennelle Human in past and diagnosed with bipolar and depression. Given Lamictal and  Wellbutrin.  Rozerem did not work. PCP tried Prozac, Paxil (sexual side effects) and trazodone (did not work). Abilify never picked up. No h/o suicidal attempt, psychosis and inpatient treatment.   Psychiatric Specialty Exam: Physical Exam  Review of Systems  Weight 210 lb (95.3 kg).There is no height or weight on file to calculate BMI.  General Appearance: NA  Eye Contact:  NA  Speech:  Clear and Coherent and Normal Rate  Volume:  Normal  Mood:  Euthymic  Affect:  NA  Thought Process:  Coherent  Orientation:  Full (Time, Place, and Person)  Thought Content:  Logical  Suicidal Thoughts:  No  Homicidal Thoughts:  No  Memory:  Immediate;   Good Recent;   Good Remote;   Good  Judgement:  Good  Insight:  Present  Psychomotor Activity:  NA  Concentration:  Concentration: Good and Attention Span: Good  Recall:  Good  Fund of Knowledge:  Good  Language:  Good  Akathisia:  No  Handed:  Right  AIMS (if indicated):     Assets:  Communication Skills Desire for Improvement Housing Resilience Transportation  ADL's:  Intact  Cognition:  WNL  Sleep:   ok      Assessment and Plan: Bipolar disorder, depressed type.  Insomnia.  Patient feeling much better since he baptized yesterday and connected to the church.  He feels the current medicine is working and he denies any mania, psychosis or any highs and  lows.  Continue Wellbutrin XL 150 mg in the morning, Lamictal 200 mg daily and mirtazapine half tablet of 50 mg at bedtime.  He also takes melatonin.  Recommended to call us back if is any question or any concern.  Patient not interested in therapy.  Follow-up in 3 months.  Follow Up Instructions:    I discussed the assessment and treatment plan with the patient. The patient was provided an opportunity to ask questions and all were answered. The patient agreed with the plan and demonstrated an understanding of the instructions.   The patient was advised to call back or seek an  in-person evaluation if the symptoms worsen or if the condition fails to improve as anticipated.  I provided 15 minutes of non-face-to-face time during this encounter.   Cleotis Nipper, MD

## 2021-02-01 ENCOUNTER — Telehealth (HOSPITAL_COMMUNITY): Payer: 59 | Admitting: Psychiatry

## 2021-04-26 ENCOUNTER — Telehealth (HOSPITAL_COMMUNITY): Payer: 59 | Admitting: Psychiatry

## 2021-05-03 ENCOUNTER — Telehealth (HOSPITAL_COMMUNITY): Payer: 59 | Admitting: Psychiatry

## 2021-05-09 ENCOUNTER — Other Ambulatory Visit: Payer: Self-pay

## 2021-05-09 ENCOUNTER — Telehealth (HOSPITAL_BASED_OUTPATIENT_CLINIC_OR_DEPARTMENT_OTHER): Payer: 59 | Admitting: Psychiatry

## 2021-05-09 ENCOUNTER — Encounter (HOSPITAL_COMMUNITY): Payer: Self-pay | Admitting: Psychiatry

## 2021-05-09 DIAGNOSIS — G4700 Insomnia, unspecified: Secondary | ICD-10-CM

## 2021-05-09 DIAGNOSIS — F3130 Bipolar disorder, current episode depressed, mild or moderate severity, unspecified: Secondary | ICD-10-CM

## 2021-05-09 MED ORDER — BUPROPION HCL ER (XL) 150 MG PO TB24: 150.0000 mg | ORAL_TABLET | Freq: Every day | ORAL | 0 refills | Status: DC

## 2021-05-09 MED ORDER — MIRTAZAPINE 15 MG PO TABS: 15.0000 mg | ORAL_TABLET | ORAL | 1 refills | Status: DC | PRN

## 2021-05-09 MED ORDER — LAMOTRIGINE 200 MG PO TABS: 200.0000 mg | ORAL_TABLET | Freq: Every day | ORAL | 0 refills | Status: DC

## 2021-05-09 NOTE — Progress Notes (Signed)
Virtual Visit via Telephone Note  I connected with Douglas Hester on 05/09/21 at  9:40 AM EDT by telephone and verified that I am speaking with the correct person using two identifiers.  Location: Patient: Home Provider: Home Office   I discussed the limitations, risks, security and privacy concerns of performing an evaluation and management service by telephone and the availability of in person appointments. I also discussed with the patient that there may be a patient responsible charge related to this service. The patient expressed understanding and agreed to proceed.   History of Present Illness: Patient is evaluated by phone session.  He been doing okay on his current medication.  He had chronic pain in his shoulder but he is not interested to have another surgery.  Since he connected to the church and make friends in the church and he started going to Sunday school and the church he has been more calm and relaxed and feeling good.  He is sleeping good most of the nights occasionally he had insomnia.  He takes the mirtazapine 15 mg on the weekends and half tablet during weeknights.  Few weeks ago he has a rash and he was scared but realized that he has changed his laundry detergent and when he went back to the original laundry detergent rash went away.  He does not want to change the medication.  He feels things are going well.  He has not started a new relationship but interested if he find a better person.  He also not able [place] which has been looking for a while.  Denies any mania or psychosis.  He has not seen his PCP in a while but recommended to have a visit with him for blood work and physical.  He sees Jarrett Soho at [facility].  Past Psychiatric History:  Saw Dr. Jennelle Human in past and diagnosed with bipolar and depression. Given Lamictal and Wellbutrin.  Rozerem did not work. PCP tried Prozac, Paxil (sexual side effects) and trazodone (did not work).  Abilify never picked up. No h/o suicidal attempt, psychosis and inpatient treatment.    Psychiatric Specialty Exam: Physical Exam  Review of Systems  Weight 214 lb (97.1 kg).There is no height or weight on file to calculate BMI.  General Appearance: NA  Eye Contact:  NA  Speech:  Clear and Coherent and Normal Rate  Volume:  Normal  Mood:  Euthymic  Affect:  NA  Thought Process:  Goal Directed  Orientation:  Full (Time, Place, and Person)  Thought Content:  WDL  Suicidal Thoughts:  No  Homicidal Thoughts:  No  Memory:  Immediate;   Good Recent;   Good Remote;   Good  Judgement:  Intact  Insight:  NA  Psychomotor Activity:  NA  Concentration:  Concentration: Good and Attention Span: Good  Recall:  Good  Fund of Knowledge:  Good  Language:  Good  Akathisia:  No  Handed:  Right  AIMS (if indicated):     Assets:  Communication Skills Desire for Improvement Housing Resilience Transportation  ADL's:  Intact  Cognition:  WNL  Sleep:   ok      Assessment and Plan: Bipolar disorder, depressed type.  Insomnia.  Patient is stable on his current medication.  Recommended to have his blood work done since he has not been in a while.  Patient promised to contact his PCP Toni Amend working at [facility] for physical and blood work.  Continue Wellbutrin XL 150  mg in the morning, Lamictal 200 mg daily and mirtazapine half tablet on weeknights and 15 mg on the weekends.  Recommended to call us back if any question or any concern.  Follow-up in 3 months.  Follow Up Instructions:    I discussed the assessment and treatment plan with the patient. The patient was provided an opportunity to ask questions and all were answered. The patient agreed with the plan and demonstrated an understanding of the instructions.   The patient was advised to call back or seek an in-person evaluation if the symptoms worsen or if the condition fails to improve as anticipated.  I provided 15 minutes of  non-face-to-face time during this encounter.   Cleotis Nipper, MD

## 2021-08-09 ENCOUNTER — Telehealth (HOSPITAL_COMMUNITY): Payer: 59 | Admitting: Psychiatry

## 2021-08-15 ENCOUNTER — Other Ambulatory Visit: Payer: Self-pay

## 2021-08-15 ENCOUNTER — Telehealth (HOSPITAL_COMMUNITY): Payer: 59 | Admitting: Psychiatry

## 2021-09-05 ENCOUNTER — Other Ambulatory Visit (HOSPITAL_COMMUNITY): Payer: Self-pay | Admitting: *Deleted

## 2021-09-05 DIAGNOSIS — G4700 Insomnia, unspecified: Secondary | ICD-10-CM

## 2021-09-05 MED ORDER — MIRTAZAPINE 15 MG PO TABS: 15.0000 mg | ORAL_TABLET | ORAL | 0 refills | Status: DC | PRN

## 2021-09-11 ENCOUNTER — Other Ambulatory Visit (HOSPITAL_COMMUNITY): Payer: Self-pay | Admitting: Psychiatry

## 2021-09-11 DIAGNOSIS — F3130 Bipolar disorder, current episode depressed, mild or moderate severity, unspecified: Secondary | ICD-10-CM

## 2021-10-08 ENCOUNTER — Other Ambulatory Visit (HOSPITAL_COMMUNITY): Payer: Self-pay | Admitting: Psychiatry

## 2021-10-08 DIAGNOSIS — F3130 Bipolar disorder, current episode depressed, mild or moderate severity, unspecified: Secondary | ICD-10-CM

## 2021-10-10 ENCOUNTER — Other Ambulatory Visit: Payer: Self-pay

## 2021-10-10 ENCOUNTER — Telehealth (HOSPITAL_BASED_OUTPATIENT_CLINIC_OR_DEPARTMENT_OTHER): Payer: 59 | Admitting: Psychiatry

## 2021-10-10 ENCOUNTER — Encounter (HOSPITAL_COMMUNITY): Payer: Self-pay | Admitting: Psychiatry

## 2021-10-10 DIAGNOSIS — G4700 Insomnia, unspecified: Secondary | ICD-10-CM | POA: Diagnosis not present

## 2021-10-10 DIAGNOSIS — F3130 Bipolar disorder, current episode depressed, mild or moderate severity, unspecified: Secondary | ICD-10-CM

## 2021-10-10 MED ORDER — BUPROPION HCL ER (XL) 150 MG PO TB24: 150.0000 mg | ORAL_TABLET | Freq: Every day | ORAL | 0 refills | Status: DC

## 2021-10-10 MED ORDER — MIRTAZAPINE 15 MG PO TABS: 15.0000 mg | ORAL_TABLET | ORAL | 0 refills | Status: DC | PRN

## 2021-10-10 MED ORDER — LAMOTRIGINE 200 MG PO TABS: 200.0000 mg | ORAL_TABLET | Freq: Every day | ORAL | 0 refills | Status: DC

## 2021-10-10 NOTE — Progress Notes (Signed)
Virtual Visit via Telephone Note ? ?I connected with Douglas Hester on 10/10/21 at  8:20 AM EDT by telephone and verified that I am speaking with the correct person using two identifiers. ? ?Location: ?Patient: Home ?Provider: Home Office ?  ?I discussed the limitations, risks, security and privacy concerns of performing an evaluation and management service by telephone and the availability of in person appointments. I also discussed with the patient that there may be a patient responsible charge related to this service. The patient expressed understanding and agreed to proceed. ? ? ?History of Present Illness: ?Patient is evaluated by phone session.  He is taking all his medication as prescribed.  He is sleeping better.  He decided not to do anymore shoulder surgery as pain is not as bad and he can handle it.  He continues to spend time in church and church related activities.  He is still single and looking for a companion and relationship but not interested in online dating.  He feels his anxiety is under control.  He is sleeping good.  He denies any anger, crying spells or any feeling of hopelessness or worthlessness.  He denies any highs and lows mania or any anger.  He is still [place] but so far he has not had any luck.  He admitted more active and motivated to do things.  He is taking mirtazapine 15 mg on the weekends and 1/2 tablet during the week nights.  He is also compliant with Lamictal and Wellbutrin.  He has no rash, itching tremors or shakes.  He had a blood work in October however he was hoping to had a follow-up appointment with the PCP to discuss the results.  His primary care physician is Jarrett Soho at [facility]. ? ?Past Psychiatric History:  ?Saw Dr. Jennelle Human in past and diagnosed with bipolar and depression. Given Lamictal and Wellbutrin.  Rozerem did not work. PCP tried Prozac, Paxil (sexual side effects) and trazodone (did not work). Abilify never  picked up. No h/o suicidal attempt, psychosis and inpatient treatment.  ? ?Psychiatric Specialty Exam: ?Physical Exam  ?Review of Systems  ?Weight 213 lb (96.6 kg).There is no height or weight on file to calculate BMI.  ?General Appearance: NA  ?Eye Contact:  NA  ?Speech:  Clear and Coherent and Normal Rate  ?Volume:  Normal  ?Mood:  Euthymic  ?Affect:  NA  ?Thought Process:  Goal Directed  ?Orientation:  Full (Time, Place, and Person)  ?Thought Content:  Logical  ?Suicidal Thoughts:  No  ?Homicidal Thoughts:  No  ?Memory:  Immediate;   Good ?Recent;   Good ?Remote;   Good  ?Judgement:  Intact  ?Insight:  Present  ?Psychomotor Activity:  NA  ?Concentration:  Concentration: Good and Attention Span: Good  ?Recall:  Good  ?Fund of Knowledge:  Good  ?Language:  Good  ?Akathisia:  No  ?Handed:  Right  ?AIMS (if indicated):     ?Assets:  Communication Skills ?Desire for Improvement ?Housing ?Resilience ?Social Support ?Transportation  ?ADL's:  Intact  ?Cognition:  WNL  ?Sleep:   ok  ? ? ? ? ?Assessment and Plan: ?Bipolar disorder, depressed type.  Insomnia. ? ?Patient is stable on his current medication.  He has no rash or itching tremors or shakes.  Continue Wellbutrin XL 150 mg in the morning, Lamictal 200 mg daily and mirtazapine 15 mg at bedtime and usually on weeknights he takes half tablet.  He is no longer taking any  anti-inflammatory prescription medication.  Discussed medication side effects and benefits.  Recommended to call us back if is any question or any concern.  Follow-up in 3 months.  His last lab work was normal.  His BUN was 14 and creatinine 0.98. ? ?Follow Up Instructions: ? ?  ?I discussed the assessment and treatment plan with the patient. The patient was provided an opportunity to ask questions and all were answered. The patient agreed with the plan and demonstrated an understanding of the instructions. ?  ?The patient was advised to call back or seek an in-person evaluation if the symptoms worsen  or if the condition fails to improve as anticipated. ? ?I provided 23 minutes of non-face-to-face time during this encounter. ? ? ?Cleotis Nipper, MD  ?

## 2021-10-31 ENCOUNTER — Telehealth (HOSPITAL_COMMUNITY): Payer: Self-pay

## 2021-10-31 NOTE — Telephone Encounter (Signed)
FYI - Patient called and stated that he wanted to make you aware that pt is renewing his Concealed [employer]. Pt's had it for 5 yrs and has the application to renew. Pt will be calling Sheriff[employer] Dept  to get additional information and calling us back ?

## 2021-11-22 ENCOUNTER — Telehealth (HOSPITAL_COMMUNITY): Payer: Self-pay | Admitting: *Deleted

## 2021-11-22 NOTE — Telephone Encounter (Signed)
Pt called today crying and sounding anguished and depressed. Pt was asking for some medication for "breakthrough" anxiety. Nurse spoke with Dr. Michae Kava who gave V.O. for Vistaril 25 mg bid prn. Writer spoke with pt, initial med ed done, who asked if medication would help with the crying spells and depression. Writer reiterated  that medication is not an antidepressant (pt is on Wellbutrin 150 XL)  but should have a calming effect. Pt verbalizes understanding and agrees to call with any concerns prior to next appointment with Dr. Michae Kava. ?

## 2021-11-23 ENCOUNTER — Telehealth (HOSPITAL_COMMUNITY): Payer: Self-pay | Admitting: *Deleted

## 2021-11-23 NOTE — Telephone Encounter (Signed)
Thanks

## 2021-11-23 NOTE — Telephone Encounter (Signed)
Encounter from 11/22/21 inadvertently charted on wrong pt.  Please disregard. Pt did not call this office. ?

## 2021-11-23 NOTE — Telephone Encounter (Signed)
Concealed carry permit renewal form and last progress note faxed to [facility] as requested by pt. ROI completed by pt.  ?

## 2022-01-09 ENCOUNTER — Encounter (HOSPITAL_COMMUNITY): Payer: Self-pay | Admitting: Psychiatry

## 2022-01-09 ENCOUNTER — Other Ambulatory Visit (HOSPITAL_COMMUNITY): Payer: Self-pay | Admitting: Psychiatry

## 2022-01-09 ENCOUNTER — Telehealth (HOSPITAL_BASED_OUTPATIENT_CLINIC_OR_DEPARTMENT_OTHER): Payer: 59 | Admitting: Psychiatry

## 2022-01-09 DIAGNOSIS — F3130 Bipolar disorder, current episode depressed, mild or moderate severity, unspecified: Secondary | ICD-10-CM | POA: Diagnosis not present

## 2022-01-09 DIAGNOSIS — G4700 Insomnia, unspecified: Secondary | ICD-10-CM

## 2022-01-09 MED ORDER — BUPROPION HCL ER (XL) 150 MG PO TB24: 150.0000 mg | ORAL_TABLET | Freq: Every day | ORAL | 0 refills | Status: DC

## 2022-01-09 MED ORDER — LAMOTRIGINE 200 MG PO TABS: 200.0000 mg | ORAL_TABLET | Freq: Every day | ORAL | 0 refills | Status: DC

## 2022-01-09 MED ORDER — MIRTAZAPINE 15 MG PO TABS: 15.0000 mg | ORAL_TABLET | ORAL | 0 refills | Status: DC | PRN

## 2022-01-09 NOTE — Progress Notes (Signed)
Virtual Visit via Telephone Note  I connected with Douglas Hester on 01/09/22 at  8:20 AM EDT by telephone and verified that I am speaking with the correct person using two identifiers.  Location: Patient: Home Provider: Home Office   I discussed the limitations, risks, security and privacy concerns of performing an evaluation and management service by telephone and the availability of in person appointments. I also discussed with the patient that there may be a patient responsible charge related to this service. The patient expressed understanding and agreed to proceed.   History of Present Illness: Patient is evaluated by phone session.  He is doing well on his current medication.  He denies any mania, psychosis, hallucination.  He is very involved in church activities and he feels that he is best in his life at this moment.  He had decided to postpone looking for a new property because he cannot afford any new place at this time.  He sleeps okay but he also sleeps during the day and sometime at night he goes to sleep very late.  Denies any mania, psychosis, hallucination.  Denies any impulsive behavior.  He has no rash, itching tremors or shakes.  He like to his current medication since it is working well.  He does not want to change the medication.  Since it is working well.  He does not want to change.  He takes mirtazapine 15 mg on the weekends and 1/2 tablet during the day nights.  He admitted few pounds weight gain because he is not watching his calorie intake but like to go back on a regular exercise walking and watching his diet.   Past Psychiatric History:  Saw Dr. Clovis Pu in past and diagnosed with bipolar and depression. Given Lamictal and Wellbutrin.  Rozerem did not work. PCP tried Prozac, Paxil (sexual side effects) and trazodone (did not work). Abilify never picked up. No h/o suicidal attempt, psychosis and inpatient treatment.   Psychiatric Specialty Exam: Physical Exam  Review  of Systems  Weight 217 lb (98.4 kg).There is no height or weight on file to calculate BMI.  General Appearance: NA  Eye Contact:  NA  Speech:  Clear and Coherent  Volume:  Normal  Mood:  Euthymic  Affect:  NA  Thought Process:  Goal Directed  Orientation:  Full (Time, Place, and Person)  Thought Content:  Logical  Suicidal Thoughts:  No  Homicidal Thoughts:  No  Memory:  Immediate;   Good Recent;   Good Remote;   Good  Judgement:  Good  Insight:  Present  Psychomotor Activity:  NA  Concentration:  Concentration: Good and Attention Span: Good  Recall:  Good  Fund of Knowledge:  Good  Language:  Good  Akathisia:  No  Handed:  Right  AIMS (if indicated):     Assets:  Communication Skills Desire for Improvement Housing Social Support Transportation  ADL's:  Intact  Cognition:  WNL  Sleep:   ok      Assessment and Plan: Bipolar disorder, depressed type.  Insomnia.  Patient is stable on his current medication.  Continue Wellbutrin XL 150 mg in the morning, Lamictal 200 mg daily and mirtazapine 15 mg at bedtime.  He is to take half a tablet on weeknights.  Recommended to call us back if there is any question or any concern.  Follow-up in 3 months.  Follow Up Instructions:    I discussed the assessment and treatment plan with the patient. The patient was provided an  opportunity to ask questions and all were answered. The patient agreed with the plan and demonstrated an understanding of the instructions.   The patient was advised to call back or seek an in-person evaluation if the symptoms worsen or if the condition fails to improve as anticipated.  I provided 15 minutes of non-face-to-face time during this encounter.   Kathlee Nations, MD

## 2022-01-10 ENCOUNTER — Telehealth (HOSPITAL_COMMUNITY): Payer: 59 | Admitting: Psychiatry

## 2022-04-10 ENCOUNTER — Telehealth (HOSPITAL_BASED_OUTPATIENT_CLINIC_OR_DEPARTMENT_OTHER): Payer: 59 | Admitting: Psychiatry

## 2022-04-10 ENCOUNTER — Encounter (HOSPITAL_COMMUNITY): Payer: Self-pay | Admitting: Psychiatry

## 2022-04-10 DIAGNOSIS — G4700 Insomnia, unspecified: Secondary | ICD-10-CM

## 2022-04-10 DIAGNOSIS — F3130 Bipolar disorder, current episode depressed, mild or moderate severity, unspecified: Secondary | ICD-10-CM

## 2022-04-10 MED ORDER — LAMOTRIGINE 200 MG PO TABS: 200.0000 mg | ORAL_TABLET | Freq: Every day | ORAL | 0 refills | Status: DC

## 2022-04-10 MED ORDER — BUPROPION HCL ER (XL) 150 MG PO TB24: 150.0000 mg | ORAL_TABLET | Freq: Every day | ORAL | 0 refills | Status: DC

## 2022-04-10 MED ORDER — MIRTAZAPINE 15 MG PO TABS: 15.0000 mg | ORAL_TABLET | ORAL | 0 refills | Status: DC | PRN

## 2022-04-10 NOTE — Progress Notes (Signed)
Virtual Visit via Telephone Note  I connected with Douglas Hester on 04/10/22 at  3:40 PM EDT by telephone and verified that I am speaking with the correct person using two identifiers.  Location: Patient: Home Provider: Home Office   I discussed the limitations, risks, security and privacy concerns of performing an evaluation and management service by telephone and the availability of in person appointments. I also discussed with the patient that there may be a patient responsible charge related to this service. The patient expressed understanding and agreed to proceed.   History of Present Illness: Patient is evaluated by phone session.  He endorsed lately not sleeping all night and getting 5 to 6 hours.  He also admitted that he is cutting down his mirtazapine and taking half tablet with the 7.5.  Otherwise his mood is stable.  He denies any crying spells, feeling of hopelessness or worthlessness.  He denies any mania, psychosis, impulsive behavior.  He is very busy in his church and he feels proud that he is doing best after such a long time and at this time not interested in a new relationship.  When asked why he is taking half tablets he reported that he was doing fine and wondering if he can take half the mirtazapine on a regular basis.  He has no tremors, shakes or any EPS.  His energy level is good.  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  His job is going well.  Past Psychiatric History:  Saw Dr. Jennelle Human in past and diagnosed with bipolar and depression. Given Lamictal and Wellbutrin.  Rozerem did not work. PCP tried Prozac, Paxil (sexual side effects) and trazodone (did not work). Abilify never picked up. No h/o suicidal attempt, psychosis and inpatient treatment.    Psychiatric Specialty Exam: Physical Exam  Review of Systems  Weight 218 lb (98.9 kg).There is no height or weight on file to calculate BMI.  General Appearance: NA  Eye  Contact:  NA  Speech:  Clear and Coherent and Normal Rate  Volume:  Normal  Mood:  Euthymic  Affect:  NA  Thought Process:  Goal Directed  Orientation:  Full (Time, Place, and Person)  Thought Content:  Logical  Suicidal Thoughts:  No  Homicidal Thoughts:  No  Memory:  Immediate;   Good Recent;   Good Remote;   Good  Judgement:  Intact  Insight:  Present  Psychomotor Activity:  NA  Concentration:  Concentration: Good and Attention Span: Good  Recall:  Good  Fund of Knowledge:  Good  Language:  Good  Akathisia:  No  Handed:  Right  AIMS (if indicated):     Assets:  Communication Skills Desire for Improvement Housing Social Support Transportation  ADL's:  Intact  Cognition:  WNL  Sleep:   5-7 hrs      Assessment and Plan: Bipolar disorder type I.  Insomnia.  I recommend he should consider taking the full dose of mirtazapine 15 mg every day since it is not helping his depression and anxiety but also his insomnia.  Patient acknowledged and agreed that he will take the mirtazapine 15 mg at bedtime along with Wellbutrin XL 150 mg in the morning and lamotrigine 200 mg daily.  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  I explained rather than trying a different medication he should take the full dose of mirtazapine.  Patient probably did agree.  I recommend to call us back if there is any question of any concern.  Follow-up in 3 months.  Follow Up Instructions:    I discussed the assessment and treatment plan with the patient. The patient was provided an opportunity to ask questions and all were answered. The patient agreed with the plan and demonstrated an understanding of the instructions.   The patient was advised to call back or seek an in-person evaluation if the symptoms worsen or if the condition fails to improve as anticipated.  Collaboration of Care: Primary Care Provider AEB notes are available in epic to  review  Patient/Guardian was advised Release of Information must be obtained prior to any record release in order to collaborate their care with an outside provider. Patient/Guardian was advised if they have not already done so to contact the registration department to sign all necessary forms in order for [place] to release information regarding their care.   Consent: Patient/Guardian gives verbal consent for treatment and assignment of benefits for services provided during this visit. Patient/Guardian expressed understanding and agreed to proceed.    I provided 18 minutes of non-face-to-face time during this encounter.   Cleotis Nipper, MD

## 2022-04-12 ENCOUNTER — Telehealth (HOSPITAL_COMMUNITY): Payer: Self-pay | Admitting: Psychiatry

## 2022-04-12 NOTE — Telephone Encounter (Signed)
Left message requesting call back to schedule 3 month follow up with Dr. Arfeen. 

## 2022-07-08 ENCOUNTER — Other Ambulatory Visit (HOSPITAL_COMMUNITY): Payer: Self-pay | Admitting: Psychiatry

## 2022-07-08 DIAGNOSIS — F3130 Bipolar disorder, current episode depressed, mild or moderate severity, unspecified: Secondary | ICD-10-CM

## 2022-07-09 ENCOUNTER — Encounter (HOSPITAL_BASED_OUTPATIENT_CLINIC_OR_DEPARTMENT_OTHER): Payer: Self-pay | Admitting: Emergency Medicine

## 2022-07-09 ENCOUNTER — Emergency Department (HOSPITAL_BASED_OUTPATIENT_CLINIC_OR_DEPARTMENT_OTHER)
Admission: EM | Admit: 2022-07-09 | Discharge: 2022-07-09 | Disposition: A | Discharge status: Home / Self Care | Payer: Managed Care, Other (non HMO) | Attending: Emergency Medicine | Admitting: Emergency Medicine

## 2022-07-09 ENCOUNTER — Emergency Department (HOSPITAL_BASED_OUTPATIENT_CLINIC_OR_DEPARTMENT_OTHER): Payer: Managed Care, Other (non HMO)

## 2022-07-09 ENCOUNTER — Other Ambulatory Visit: Payer: Self-pay

## 2022-07-09 DIAGNOSIS — R0789 Other chest pain: Secondary | ICD-10-CM

## 2022-07-09 DIAGNOSIS — R000 Tachycardia, unspecified: Secondary | ICD-10-CM | POA: Diagnosis not present

## 2022-07-09 LAB — BASIC METABOLIC PANEL
Anion gap: 7 (ref 5–15)
BUN: 15 mg/dL (ref 6–20)
CO2: 26 mmol/L (ref 22–32)
Calcium: 9.2 mg/dL (ref 8.9–10.3)
Chloride: 103 mmol/L (ref 98–111)
Creatinine, Ser: 1.01 mg/dL (ref 0.61–1.24)
GFR, Estimated: >60 mL/min (ref >60)
Glucose, Bld: 96 mg/dL (ref 70–99)
Potassium: 4.1 mmol/L (ref 3.5–5.1)
Sodium: 136 mmol/L (ref 135–145)

## 2022-07-09 LAB — CBC
HCT: 46.5 % (ref 39.0–52.0)
Hemoglobin: 15.4 g/dL (ref 13.0–17.0)
MCH: 28.5 pg (ref 26.0–34.0)
MCHC: 33.1 g/dL (ref 30.0–36.0)
MCV: 86 fL (ref 80.0–100.0)
Platelets: 237 10*3/uL (ref 150–400)
RBC: 5.41 MIL/uL (ref 4.22–5.81)
RDW: 12.4 % (ref 11.5–15.5)
WBC: 8.4 10*3/uL (ref 4.0–10.5)
nRBC: 0 % (ref 0.0–0.2)

## 2022-07-09 LAB — D-DIMER, QUANTITATIVE: D-Dimer, Quant: 0.51 ug/mL-FEU — ABNORMAL HIGH (ref 0.00–0.50)

## 2022-07-09 LAB — TROPONIN I (HIGH SENSITIVITY)
Troponin I (High Sensitivity): 4 ng/L (ref <18)
Troponin I (High Sensitivity): 4 ng/L (ref <18)

## 2022-07-09 MED ORDER — ALUM & MAG HYDROXIDE-SIMETH 200-200-20 MG/5ML PO SUSP
30.0000 mL | Freq: Once | ORAL | Status: AC
  Administered 2022-07-09: 30 mL via ORAL
  Filled 2022-07-09: qty 30

## 2022-07-09 MED ORDER — IOHEXOL 350 MG/ML SOLN
75.0000 mL | Freq: Once | INTRAVENOUS | Status: AC | PRN
  Administered 2022-07-09: 75 mL via INTRAVENOUS

## 2022-07-09 NOTE — Discharge Instructions (Addendum)
We evaluated you for your chest pain.  Your laboratory testing including cardiac enzymes were normal.  Your CT scan did not show evidence of a blood clot in your lungs.  Please follow-up with your cardiologist as scheduled.  Please return to the emergency department if you develop any severe pain, pain with sweating, vomiting or nausea, lightheadedness or fainting, or difficulty breathing.

## 2022-07-09 NOTE — ED Notes (Signed)
Patient ambulatory to restroom with steady gait. Denies dizziness, lightheadedness.

## 2022-07-09 NOTE — ED Notes (Signed)
Patient transported to CT 

## 2022-07-09 NOTE — ED Triage Notes (Signed)
Pt c/o intermittent burning upper LT CP; has appt with cardiologist coming up, reports tachycardia

## 2022-07-09 NOTE — ED Provider Notes (Signed)
[facility] EMERGENCY DEPARTMENT Provider Note  CSN: [id number] Arrival date & time: 07/09/22 1757  Chief Complaint(s) Chest Pain  HPI Douglas Hester is a 61 y.o. male with a history of GERD presenting to the emergency department chest pain.  He reports anterior chest pain, radiates to both shoulders.  Reports it is mild, burning sensation.  Does not radiate to the back.  Began earlier today, is mild, no associated symptoms such as nausea, vomiting, diaphoresis, lightheadedness, dizziness, syncope, cough, leg swelling.  No modifying factors, pain not exertional or pleuritic.  He denies any recent travel or surgeries.  He notes additionally that he has had an elevated heart rate for around 1 month usually higher than 100, he had an appointment with his primary doctor recently where the blood tests were drawn.  He denies any heat or cold intolerance, hair loss, anorexia, weight loss.   Past Medical History Past Medical History:  Diagnosis Date   Anxiety    Chronic GERD    Patient Active Problem List   Diagnosis Date Noted   Biliary colic 10/11/2018   Gallstones 10/11/2018   Hypokalemia 05/01/2017   Elevated LFTs 05/01/2017   Elevated lipase 05/01/2017   Chest pain 04/29/2017   Fever, unspecified 04/29/2017   Tachycardia 04/29/2017   Hyperglycemia 04/29/2017   Home Medication(s) Prior to Admission medications   Medication Sig Start Date End Date Taking? Authorizing Provider  Aspirin-Salicylamide-Caffeine (BC HEADACHE POWDER PO) Take 1 packet by mouth every evening.    [provider]  buPROPion (WELLBUTRIN XL) 150 MG 24 hr tablet Take 1 tablet (150 mg total) by mouth daily. 04/10/22   Arfeen, Phillips Grout, MD  Calcium Carbonate Antacid (TUMS PO) Take 4 tablets by mouth at bedtime as needed (heartburn).    [provider]  cycloSPORINE (RESTASIS) 0.05 % ophthalmic emulsion Place 2 drops into both eyes 2 (two) times daily. 10/13/15   [provider]   fluticasone (FLONASE) 50 MCG/ACT nasal spray USE 1 SPRAY IN EACH NOSTRIL TWICE A DAY 02/28/19   [provider]  ibuprofen (ADVIL,MOTRIN) 200 MG tablet Take 400 mg by mouth daily as needed for moderate pain.    [provider]  lamoTRIgine (LAMICTAL) 200 MG tablet Take 1 tablet (200 mg total) by mouth daily. 04/10/22   Arfeen, Phillips Grout, MD  Melatonin 5 MG TABS Take 5 mg by mouth at bedtime as needed (sleep).    [provider]  mirtazapine (REMERON) 15 MG tablet Take 1 tablet (15 mg total) by mouth as needed (for sleep). 04/10/22 04/10/23  Cleotis Nipper, MD                                                                                                                                    Past Surgical History Past Surgical History:  Procedure Laterality Date   CHOLECYSTECTOMY N/A 10/11/2018   Procedure: LAPAROSCOPIC CHOLECYSTECTOMY WITH INTRAOPERATIVE CHOLANGIOGRAM;  Surgeon: Griselda Miner, MD;  Location: [facility];  Service: General;  Laterality: N/A;   COLONOSCOPY  ~ 2014   done by GI in [place]. Polyps were removed. Patient not aware what type of polyps these were   ESOPHAGOGASTRODUODENOSCOPY  early 1990s   MD found ulcer   INGUINAL HERNIA REPAIR Left ~ 2010   with Mesh.    VASECTOMY     Family History Family History  Problem Relation Age of Onset   Lung cancer Mother    Lymphoma Father    Valvular heart disease Sister     Social History Social History   Tobacco Use   Smoking status: Never   Smokeless tobacco: Never  Vaping Use   Vaping Use: Never used  Substance Use Topics   Alcohol use: Yes    Comment: rare   Drug use: No   Allergies Patient has no known allergies.  Review of Systems Review of Systems  All other systems reviewed and are negative.   Physical Exam Vital Signs  I have reviewed the triage vital signs BP (!) 143/93 (BP Location: Right Arm)   Pulse 100   Temp 98 F (36.7 C)   Resp 17   Ht 5\' 9"  (1.753 m)   Wt 98.9 kg    SpO2 97%   BMI 32.19 kg/m  Physical Exam Vitals and nursing note reviewed.  Constitutional:      General: He is not in acute distress.    Appearance: Normal appearance.  HENT:     Mouth/Throat:     Mouth: Mucous membranes are moist.  Eyes:     Conjunctiva/sclera: Conjunctivae normal.  Cardiovascular:     Rate and Rhythm: Regular rhythm. Tachycardia present.  Pulmonary:     Effort: Pulmonary effort is normal. No respiratory distress.     Breath sounds: Normal breath sounds.  Abdominal:     General: Abdomen is flat.     Palpations: Abdomen is soft.     Tenderness: There is no abdominal tenderness.  Musculoskeletal:     Right lower leg: No edema.     Left lower leg: No edema.  Skin:    General: Skin is warm and dry.     Capillary Refill: Capillary refill takes less than 2 seconds.  Neurological:     Mental Status: He is alert and oriented to person, place, and time. Mental status is at baseline.  Psychiatric:        Mood and Affect: Mood normal.        Behavior: Behavior normal.     ED Results and Treatments Labs (all labs ordered are listed, but only abnormal results are displayed) Labs Reviewed  D-DIMER, QUANTITATIVE - Abnormal; Notable for the following components:      Result Value   D-Dimer, Quant 0.51 (*)    All other components within normal limits  BASIC METABOLIC PANEL  CBC  TROPONIN I (HIGH SENSITIVITY)  TROPONIN I (HIGH SENSITIVITY)  Radiology CT Angio Chest PE W/Cm &/Or Wo Cm  Result Date: 07/09/2022 CLINICAL DATA:  Positive D-dimer and left-sided chest pain, initial encounter EXAM: CT ANGIOGRAPHY CHEST WITH CONTRAST TECHNIQUE: Multidetector CT imaging of the chest was performed using the standard protocol during bolus administration of intravenous contrast. Multiplanar CT image reconstructions and MIPs were obtained to evaluate the  vascular anatomy. RADIATION DOSE REDUCTION: This exam was performed according to the departmental dose-optimization program which includes automated exposure control, adjustment of the mA and/or kV according to patient size and/or use of iterative reconstruction technique. CONTRAST:  34mL OMNIPAQUE IOHEXOL 350 MG/ML SOLN COMPARISON:  Chest x-ray from earlier in the same day. FINDINGS: Cardiovascular: Thoracic aorta and its branches demonstrate mild atherosclerotic calcifications. No aneurysmal dilatation or dissection is seen. No cardiac enlargement is noted. No significant coronary calcifications are noted. The pulmonary artery shows a normal branching pattern bilaterally. No filling defect to suggest pulmonary embolism is noted. Mediastinum/Nodes: Thoracic inlet is within normal limits. No hilar or mediastinal adenopathy is noted. The esophagus as visualized is within normal limits. Lungs/Pleura: The lungs are well aerated bilaterally. No focal infiltrate or sizable effusion is seen. No parenchymal nodule is noted. Upper Abdomen: Gallbladder has been surgically removed. The remainder of the upper abdomen is within normal limits. Musculoskeletal: Degenerative changes of the thoracic spine are noted. Review of the MIP images confirms the above findings. IMPRESSION: No evidence of pulmonary emboli. No acute abnormality to correspond with the given clinical history. Aortic Atherosclerosis (ICD10-I70.0). Electronically Signed   By: Alcide Clever M.D.   On: 07/09/2022 19:34   DG Chest 2 View  Result Date: 07/09/2022 CLINICAL DATA:  Chest pain, tachycardia EXAM: CHEST - 2 VIEW COMPARISON:  10/11/2018 FINDINGS: The heart size and mediastinal contours are within normal limits. Both lungs are clear. The visualized skeletal structures are unremarkable. IMPRESSION: No active cardiopulmonary disease. Electronically Signed   By: Sharlet Salina M.D.   On: 07/09/2022 18:41    Pertinent labs & imaging results that were  available during my care of the patient were reviewed by me and considered in my medical decision making (see MDM for details).  Medications Ordered in ED Medications  iohexol (OMNIPAQUE) 350 MG/ML injection 75 mL (75 mLs Intravenous Contrast Given 07/09/22 1917)                                                                                                                                     Procedures .1-3 Lead EKG Interpretation  Performed by: Lonell Grandchild, MD Authorized by: Lonell Grandchild, MD     Interpretation: abnormal     ECG rate:  110   ECG rate assessment: tachycardic     Rhythm: sinus tachycardia     Ectopy: none     Conduction: normal     (including critical care time)  Medical Decision Making / ED Course   MDM:  61 year old male  presenting to the emergency department with chest pain.  Patient overall very well-appearing, vital signs notable for tachycardia.  EKG demonstrates sinus tachycardia without significant other change from previous EKGs.  Unclear cause of chest pain, given burning nature could be component of reflux.  Doubt ACS, symptoms atypical, no associated symptoms.  No PE risk factors but given tachycardia will obtain D-dimer.  No symptoms such as cough to suggest pneumonia no respiratory symptoms to suggest pneumothorax but will obtain chest x-ray.  Doubt dissection, pain mild, gradual onset, does not radiate to the back.  Unclear cause of tachycardia, patient does have follow-up with cardiology, this appears to have been present for at least a month per the patient.  He does not have any associated symptoms concerning for thyroid pathology.  Will reassess.  Clinical Course as of 07/09/22 2056  Sun Jul 09, 2022  2054 Troponin negative x 2.  CT PE negative.  Heart rate improved without intervention.  Very low concern for ACS given atypical symptoms and negative troponin x 2 with no acute EKG changes.  Advise close follow-up with his primary  doctor.  Patient already has an appointment with cardiology for his tachycardia over the past month. Will discharge patient to home. All questions answered. Patient comfortable with plan of discharge. Return precautions discussed with patient and specified on the after visit summary.  [WS]    Clinical Course User Index [WS] Lonell Grandchild, MD     Additional history obtained: -Additional history obtained from family   Lab Tests: -I ordered, reviewed, and interpreted labs.   The pertinent results include:   Labs Reviewed  D-DIMER, QUANTITATIVE - Abnormal; Notable for the following components:      Result Value   D-Dimer, Quant 0.51 (*)    All other components within normal limits  BASIC METABOLIC PANEL  CBC  TROPONIN I (HIGH SENSITIVITY)  TROPONIN I (HIGH SENSITIVITY)    Notable for minimally elevated d-dimer  EKG   EKG Interpretation  Date/Time:  Sunday July 09 2022 18:08:37 EST Ventricular Rate:  114 PR Interval:  158 QRS Duration: 147 QT Interval:  352 QTC Calculation: 485 R Axis:   -88 Text Interpretation: Sinus tachycardia RBBB and LAFB No significant change since last tracing Confirmed by Alvino Blood ([id number]) on 07/09/2022 6:18:01 PM         Imaging Studies ordered: I ordered imaging studies including CT PE On my interpretation imaging demonstrates no PE I independently visualized and interpreted imaging. I agree with the radiologist interpretation   Medicines ordered and prescription drug management: Meds ordered this encounter  Medications   iohexol (OMNIPAQUE) 350 MG/ML injection 75 mL    -I have reviewed the patients home medicines and have made adjustments as needed    Cardiac Monitoring: The patient was maintained on a cardiac monitor.  I personally viewed and interpreted the cardiac monitored which showed an underlying rhythm of: sinus tachycardia  Social Determinants of Health:  Diagnosis or treatment significantly limited by  social determinants of health: obesity   Reevaluation: After the interventions noted above, I reevaluated the patient and found that they have improved  Co morbidities that complicate the patient evaluation  Past Medical History:  Diagnosis Date   Anxiety    Chronic GERD       Dispostion: Disposition decision including need for hospitalization was considered, and patient discharged from emergency department.    Final Clinical Impression(s) / ED Diagnoses Final diagnoses:  Atypical chest pain  This chart was dictated using voice recognition software.  Despite best efforts to proofread,  errors can occur which can change the documentation meaning.    Lonell GrandchildScheving, Affie Gasner L, MD 07/09/22 2056

## 2022-07-11 ENCOUNTER — Other Ambulatory Visit (HOSPITAL_COMMUNITY): Payer: Self-pay | Admitting: Psychiatry

## 2022-07-11 ENCOUNTER — Telehealth (HOSPITAL_COMMUNITY): Payer: Self-pay

## 2022-07-11 DIAGNOSIS — F3130 Bipolar disorder, current episode depressed, mild or moderate severity, unspecified: Secondary | ICD-10-CM

## 2022-07-11 MED ORDER — LAMOTRIGINE 200 MG PO TABS: 200.0000 mg | ORAL_TABLET | Freq: Every day | ORAL | 0 refills | Status: DC

## 2022-07-11 MED ORDER — BUPROPION HCL ER (XL) 150 MG PO TB24: 150.0000 mg | ORAL_TABLET | Freq: Every day | ORAL | 0 refills | Status: DC

## 2022-07-11 NOTE — Telephone Encounter (Signed)
Patient called to request a refill for the following medication please advise  Bupropion Lamotrigine

## 2022-07-11 NOTE — Telephone Encounter (Signed)
Done

## 2022-07-11 NOTE — Progress Notes (Signed)
Done

## 2022-07-13 ENCOUNTER — Encounter: Payer: Self-pay | Admitting: Cardiology

## 2022-07-13 ENCOUNTER — Ambulatory Visit: Payer: Managed Care, Other (non HMO) | Admitting: Cardiology

## 2022-07-13 VITALS — BP 124/82 | HR 95 | Resp 16 | Ht 69.0 in | Wt 218.0 lb

## 2022-07-13 DIAGNOSIS — R072 Precordial pain: Secondary | ICD-10-CM

## 2022-07-13 DIAGNOSIS — I10 Essential (primary) hypertension: Secondary | ICD-10-CM

## 2022-07-13 DIAGNOSIS — R0609 Other forms of dyspnea: Secondary | ICD-10-CM

## 2022-07-13 DIAGNOSIS — I700 Atherosclerosis of aorta: Secondary | ICD-10-CM

## 2022-07-13 DIAGNOSIS — I4510 Unspecified right bundle-branch block: Secondary | ICD-10-CM

## 2022-07-13 MED ORDER — METOPROLOL TARTRATE 50 MG PO TABS: 50.0000 mg | ORAL_TABLET | Freq: Two times a day (BID) | ORAL | 0 refills | Status: DC

## 2022-07-13 NOTE — Progress Notes (Signed)
ID:  Douglas Hester, DOB [birth year], MRN [id number]  PCP:  Jarrett Soho, PA-C  Cardiologist:  Tessa Lerner, DO, [facility] (established care 07/13/2022)  REASON FOR CONSULT: Hypertension and evaluation of heart disease  REQUESTING PHYSICIAN:  Jarrett Soho, PA-C [address] [place],  [place] [address]  Chief Complaint  Patient presents with   New Patient (Initial Visit)    Referred by Jarrett Soho, PA Chest pain  Elevated BP    HPI  Douglas Hester is a 61 y.o. Caucasian male who presents to the clinic for evaluation of hypertension, evaluation of heart disease at the request of Jarrett Soho, [place]. His past medical history and cardiovascular risk factors include: Hypertension, anxiety/depression, aortic atherosclerosis.  Approximately 3 weeks ago patient started noticing that his blood pressures were trending up along with his pulse.  He had a sister come in and check his blood pressures which were quite elevated.  He followed up with PCP on started on lisinopril which did improve his blood pressures.  However, this past Saturday he also started having chest pain while at rest.  The discomfort is located substernally and radiating to the left arm, intensity is 8 out of 10, it did last for 60 minutes, and self-limited.  The discomfort is not brought on by effort related activities or stress.  He did go to the ED on 07/09/2022.  High sensitive troponins negative x 2, CT PE protocol negative for pulmonary embolism, electrolytes and hemoglobin within acceptable limits.  Associated symptoms also include feeling tired and fatigue and shortness of breath with effort related activities.  A few blood pressure readings that he brings in at today's office visit illustrates an SBP ranging between 136-150 mmHg and DBP ranging between 91-104 mmHg.   Currently he denies any active chest pain or heart failure symptoms.  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  Mom had a angioplasty done in her 46s and sister had valve repair in her 29s.  Frozen dinner, salads, baked potatoes, chicken and beef. He takes BC powder and NSAIDs as needed.   Patient works the night shift, sleeps 6 to 7 hours, still feels tired and fatigued, at times has to pull the truck aside to take a power nap.  He has not been evaluated for sleep apnea in the past.  ALLERGIES: No Known Allergies  MEDICATION LIST PRIOR TO VISIT: Current Meds  Medication Sig   Aspirin-Salicylamide-Caffeine (BC HEADACHE POWDER PO) Take 1 packet by mouth every evening.   buPROPion (WELLBUTRIN XL) 150 MG 24 hr tablet Take 1 tablet (150 mg total) by mouth daily.   Calcium Carbonate Antacid (TUMS PO) Take 4 tablets by mouth at bedtime as needed (heartburn).   ibuprofen (ADVIL,MOTRIN) 200 MG tablet Take 400 mg by mouth daily as needed for moderate pain.   lamoTRIgine (LAMICTAL) 200 MG tablet Take 1 tablet (200 mg total) by mouth daily.   lisinopril (ZESTRIL) 10 MG tablet Take 10 mg by mouth daily.   Melatonin 5 MG TABS Take 5 mg by mouth at bedtime as needed (sleep).   metoprolol tartrate (LOPRESSOR) 50 MG tablet Take 1 tablet (50 mg total) by mouth 2 (two) times daily. Start 2 weeks before CT scan.   mirtazapine (REMERON) 15 MG tablet Take 1 tablet (15 mg total) by mouth as needed (for sleep).  PAST MEDICAL HISTORY: Past Medical History:  Diagnosis Date   Anxiety    Chronic GERD    Hypertension     PAST SURGICAL HISTORY: Past Surgical History:  Procedure Laterality Date   CHOLECYSTECTOMY N/A 10/11/2018   Procedure: LAPAROSCOPIC CHOLECYSTECTOMY WITH INTRAOPERATIVE CHOLANGIOGRAM;  Surgeon: Griselda Miner, MD;  Location: [facility];  Service: General;  Laterality: N/A;   COLONOSCOPY  ~ 2014   done by GI in [place]. Polyps were removed. Patient not aware  what type of polyps these were   ESOPHAGOGASTRODUODENOSCOPY  early 1990s   MD found ulcer   INGUINAL HERNIA REPAIR Left ~ 2010   with Mesh.    VASECTOMY      FAMILY HISTORY: The patient family history includes Lung cancer in his mother; Lymphoma in his father; Valvular heart disease in his sister.  SOCIAL HISTORY:  The patient  reports that he has never smoked. He has never used smokeless tobacco. He reports current alcohol use. He reports that he does not use drugs.  REVIEW OF SYSTEMS: Review of Systems  Constitutional: Positive for malaise/fatigue.  Cardiovascular:  Positive for chest pain and dyspnea on exertion. Negative for claudication, irregular heartbeat, leg swelling, near-syncope, orthopnea, palpitations, paroxysmal nocturnal dyspnea and syncope.  Respiratory:  Positive for snoring. Negative for shortness of breath.   Hematologic/Lymphatic: Negative for bleeding problem.  Musculoskeletal:  Negative for muscle cramps and myalgias.  Neurological:  Negative for dizziness and light-headedness.    PHYSICAL EXAM:    07/13/2022   10:45 AM 07/09/2022    8:30 PM 07/09/2022    8:10 PM  Vitals with BMI  Height 5\' 9"     Weight 218 lbs    BMI 32.18    Systolic 124 147  Diastolic 82 104 93  Pulse 95 102 100    Physical Exam  Constitutional: No distress.  Age appropriate, hemodynamically stable.   Neck: No JVD present.  Cardiovascular: Normal rate, regular rhythm, S1 normal, S2 normal, intact distal pulses and normal pulses. Exam reveals no gallop, no S3 and no S4.  No murmur heard. Pulmonary/Chest: Effort normal and breath sounds normal. No stridor. He has no wheezes. He has no rales.  Abdominal: Soft. Bowel sounds are normal. He exhibits no distension. There is no abdominal tenderness.  Musculoskeletal:        General: No edema.     Cervical back: Neck supple.  Neurological: He is alert and oriented to person, place, and time. He has intact cranial nerves (2-12).   Skin: Skin is warm and moist.   CARDIAC DATABASE: EKG: 07/13/2022: Normal sinus rhythm, 91 bpm, right bundle branch block, left axis deviation, left anterior fascicular block  Echocardiogram: 04/30/2017: LVEF 60-65%, basal septal hypertrophy, grade 1 diastolic impairment, aortic root normal in size, right ventricular size and function normal, mild TR, no pericardial effusion  Stress Testing: No results found for this or any previous visit from the past 1095 days.  Heart Catheterization: None  LABORATORY DATA: External Labs: Collected: 12//2023 provided by PCP. Total cholesterol 173, triglycerides 83, HDL 54, calculated LDL 104, non-HDL 119 BUN 14, creatinine 0.95. Sodium 138, potassium 4.9, chloride 104, bicarb 29. AST 19, ALT 33, alkaline phosphatase 79  IMPRESSION:    ICD-10-CM   1. Precordial pain  R07.2 EKG 12-Lead    PCV ECHOCARDIOGRAM COMPLETE    CT CORONARY MORPH W/CTA COR W/SCORE W/CA W/CM &/OR WO/CM    metoprolol tartrate (LOPRESSOR) 50 MG tablet    2. Dyspnea on  exertion  R06.09 PCV ECHOCARDIOGRAM COMPLETE    CT CORONARY MORPH W/CTA COR W/SCORE W/CA W/CM &/OR WO/CM    3. Benign hypertension  I10 Ambulatory referral to Sleep Studies    4. RBBB  I45.10 CT CORONARY MORPH W/CTA COR W/SCORE W/CA W/CM &/OR WO/CM    5. Atherosclerosis of aorta (HCC)  I70.0 CT CORONARY MORPH W/CTA COR W/SCORE W/CA W/CM &/OR WO/CM       RECOMMENDATIONS: Jathniel Smeltzer is a 61 y.o. Caucasian male whose past medical history and cardiac risk factors include: Hypertension, anxiety/depression, aortic atherosclerosis.  Precordial pain No active chest pain. Predominantly noncardiac. EKG: Nonischemic. Discussed undergoing stress test versus coronary CTA.  Shared decision was to proceed with coronary CTA.  Risks, benefits, alternatives, and limitations discussed. Start Lopressor 50 mg p.o. twice daily 2 weeks prior to the CTA. Recent labs performed in the ED reviewed as part of today's  office visit. Echo will be ordered to evaluate for structural heart disease and left ventricular systolic function.  Benign hypertension Office blood pressures are well-controlled. Currently on lisinopril. Would recommend a goal systolic blood pressure of 120 mmHg if able to tolerate Advised on keeping a blood pressure log and to review it with either myself or PCP to see if additional medication titration is warranted. We emphasized the importance of a low-salt diet-since he is single he consumes a lot of TV dinners and processed meals. We emphasized the importance of moderate intensity exercise as tolerated with a goal of 30 minutes a day 5 days a week. Given his episodes of snoring and apnea that he is noticed, elevated blood pressures, his occupation as a Naval architect, and his need to take frequent naps recommend sleep study to evaluate for sleep-related disorders/sleep apnea.  Referral provided. We emphasized the importance of reducing the use of BC powder and high-dose Motrin.  Dyspnea on exertion Workup as discussed above  FINAL MEDICATION LIST END OF ENCOUNTER: Meds ordered this encounter  Medications   metoprolol tartrate (LOPRESSOR) 50 MG tablet    Sig: Take 1 tablet (50 mg total) by mouth 2 (two) times daily. Start 2 weeks before CT scan.    Dispense:  60 tablet    Refill:  0    Medications Discontinued During This Encounter  Medication Reason   fluticasone (FLONASE) 50 MCG/ACT nasal spray    cycloSPORINE (RESTASIS) 0.05 % ophthalmic emulsion      Current Outpatient Medications:    Aspirin-Salicylamide-Caffeine (BC HEADACHE POWDER PO), Take 1 packet by mouth every evening., Disp: , Rfl:    buPROPion (WELLBUTRIN XL) 150 MG 24 hr tablet, Take 1 tablet (150 mg total) by mouth daily., Disp: 90 tablet, Rfl: 0   Calcium Carbonate Antacid (TUMS PO), Take 4 tablets by mouth at bedtime as needed (heartburn)., Disp: , Rfl:    ibuprofen (ADVIL,MOTRIN) 200 MG tablet, Take 400 mg by  mouth daily as needed for moderate pain., Disp: , Rfl:    lamoTRIgine (LAMICTAL) 200 MG tablet, Take 1 tablet (200 mg total) by mouth daily., Disp: 90 tablet, Rfl: 0   lisinopril (ZESTRIL) 10 MG tablet, Take 10 mg by mouth daily., Disp: , Rfl:    Melatonin 5 MG TABS, Take 5 mg by mouth at bedtime as needed (sleep)., Disp: , Rfl:    metoprolol tartrate (LOPRESSOR) 50 MG tablet, Take 1 tablet (50 mg total) by mouth 2 (two) times daily. Start 2 weeks before CT scan., Disp: 60 tablet, Rfl: 0   mirtazapine (REMERON) 15 MG  tablet, Take 1 tablet (15 mg total) by mouth as needed (for sleep)., Disp: 90 tablet, Rfl: 0  Orders Placed This Encounter  Procedures   CT CORONARY MORPH W/CTA COR W/SCORE W/CA W/CM &/OR WO/CM   Ambulatory referral to Sleep Studies   EKG 12-Lead   PCV ECHOCARDIOGRAM COMPLETE    There are no Patient Instructions on file for this visit.   --Continue cardiac medications as reconciled in final medication list. --Return in about 4 weeks (around 08/10/2022) for Reevaluation of, Chest pain, Review test results. or sooner if needed. --Continue follow-up with your primary care physician regarding the management of your other chronic comorbid conditions.  Patient's questions and concerns were addressed to his satisfaction. He voices understanding of the instructions provided during this encounter.   This note was created using a voice recognition software as a result there may be grammatical errors inadvertently enclosed that do not reflect the nature of this encounter. Every attempt is made to correct such errors.  Tessa LernerSunit Xzavion Doswell, [place]DO, [facility] IncFACC  Pager: [phone] Office: [phone]

## 2022-07-17 ENCOUNTER — Telehealth: Payer: Self-pay | Admitting: Cardiology

## 2022-07-17 ENCOUNTER — Ambulatory Visit: Payer: Managed Care, Other (non HMO) | Admitting: Cardiology

## 2022-07-17 ENCOUNTER — Telehealth: Payer: Self-pay

## 2022-07-17 NOTE — Telephone Encounter (Signed)
Patient had a few questions regarding his lisinopril prescription. Please call him back to discuss.

## 2022-07-17 NOTE — Telephone Encounter (Signed)
Patient is calling about his lisinopril. He said his heart rate is still hight and has heard of a medication that helps with hypertension AND heart rate and is wanting to know if this is an option for him over the lisinopril. He does not remember the name of the medication but is wondering if you know about the options.  Please advise and I can call him back. Call back number: [phone]

## 2022-07-21 NOTE — Telephone Encounter (Signed)
I called him and left a message.  See the other message.  IF he calls back let me know.   Verdelle Valtierra [place], DO, [facility]

## 2022-07-21 NOTE — Telephone Encounter (Signed)
Called him today and left a voicemail. For now I would continue the same medications until his coronary CTA is complete.   We can discuss changes at the next visit.  Nic Lampe [place], DO, [facility]

## 2022-08-04 ENCOUNTER — Other Ambulatory Visit: Payer: Self-pay | Admitting: Cardiology

## 2022-08-04 DIAGNOSIS — R072 Precordial pain: Secondary | ICD-10-CM

## 2022-08-04 NOTE — Telephone Encounter (Signed)
Called and left VM for patient to call back

## 2022-08-09 NOTE — Telephone Encounter (Signed)
Tried calling patient again, no answer. Left VM to call back.

## 2022-08-14 ENCOUNTER — Other Ambulatory Visit: Payer: Managed Care, Other (non HMO)

## 2022-08-21 ENCOUNTER — Telehealth (HOSPITAL_COMMUNITY): Payer: Self-pay | Admitting: *Deleted

## 2022-08-21 NOTE — Telephone Encounter (Signed)
Pt now has an appointment on 08/28/22.

## 2022-08-21 NOTE — Telephone Encounter (Signed)
Pt called requesting a letter for [place] [employer] stating that his driving abilities are not effected by the use of Lamictal and Wellbutrin. Last was on 04/11/23 and pt has no future appointments scheduled. Please review and advise.

## 2022-08-21 NOTE — Telephone Encounter (Signed)
I never discussed this on his previous visits.  He need to schedule appointment since there is no appointment.

## 2022-08-28 ENCOUNTER — Telehealth (HOSPITAL_COMMUNITY): Payer: Self-pay | Admitting: *Deleted

## 2022-08-28 ENCOUNTER — Telehealth (HOSPITAL_BASED_OUTPATIENT_CLINIC_OR_DEPARTMENT_OTHER): Payer: 59 | Admitting: Psychiatry

## 2022-08-28 ENCOUNTER — Encounter (HOSPITAL_COMMUNITY): Payer: Self-pay | Admitting: Psychiatry

## 2022-08-28 DIAGNOSIS — F3130 Bipolar disorder, current episode depressed, mild or moderate severity, unspecified: Secondary | ICD-10-CM

## 2022-08-28 DIAGNOSIS — G4700 Insomnia, unspecified: Secondary | ICD-10-CM

## 2022-08-28 MED ORDER — LAMOTRIGINE 200 MG PO TABS: 200.0000 mg | ORAL_TABLET | Freq: Every day | ORAL | 0 refills | Status: DC

## 2022-08-28 MED ORDER — BUPROPION HCL ER (XL) 150 MG PO TB24: 150.0000 mg | ORAL_TABLET | Freq: Every day | ORAL | 0 refills | Status: DC

## 2022-08-28 MED ORDER — MIRTAZAPINE 15 MG PO TABS: 15.0000 mg | ORAL_TABLET | ORAL | 0 refills | Status: DC | PRN

## 2022-08-28 NOTE — Progress Notes (Signed)
Virtual Visit via Telephone Note  I connected with Douglas Hester on 08/28/22 at 10:00 AM EST by telephone and verified that I am speaking with the correct person using two identifiers.  Location: Patient: Home Provider: Home Office   I discussed the limitations, risks, security and privacy concerns of performing an evaluation and management service by telephone and the availability of in person appointments. I also discussed with the patient that there may be a patient responsible charge related to this service. The patient expressed understanding and agreed to proceed.   History of Present Illness: Patient is evaluated by phone session.  He is back on mirtazapine full dose of 15 mg which is helping his sleep.  He denies any mania, anger, irritability or any impulsive behavior.  He developed COVID but now getting better.  He is compliant with Lamictal and Wellbutrin.  He enjoys spending time with the church and church related activities.  His appetite is okay.  His weight is stable.  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  He has no issues at work.  Recently he was asked to present a letter that current medicine not interfering with his job duties.  He does not take mirtazapine until he come back from work and sleep in the morning.  He reported that he feel opposite that Lamictal and Wellbutrin gave him more energy and keep his mood stable.  Patient told he was also screened for apnea at work by measuring his neck which is less than 17 inch.  Patient does not want to change the medication since it is working.  Denies any panic attack, suicidal thoughts.     Past Psychiatric History:  Saw Dr. Clovis Pu in past and diagnosed with bipolar and depression. Given Lamictal and Wellbutrin.  Rozerem did not work. PCP tried Prozac, Paxil (sexual side effects) and trazodone (did not work). Abilify never picked up. No h/o suicidal attempt, psychosis and inpatient treatment.    Psychiatric Specialty Exam: Physical Exam  Review of Systems  Weight 218 lb (98.9 kg).There is no height or weight on file to calculate BMI.  General Appearance: NA  Eye Contact:  NA  Speech:  Clear and Coherent and Normal Rate  Volume:  Normal  Mood:  Euthymic  Affect:  NA  Thought Process:  Goal Directed  Orientation:  Full (Time, Place, and Person)  Thought Content:  Logical  Suicidal Thoughts:  No  Homicidal Thoughts:  No  Memory:  Immediate;   Good Recent;   Good Remote;   Good  Judgement:  Good  Insight:  Present  Psychomotor Activity:  Normal  Concentration:  Concentration: Good and Attention Span: Good  Recall:  Good  Fund of Knowledge:  Good  Language:  Good  Akathisia:  No  Handed:  Right  AIMS (if indicated):     Assets:  Communication Skills Desire for Improvement Housing Resilience Social Support Talents/Skills Transportation  ADL's:  Intact  Cognition:  WNL  Sleep:   6-7 hrs      Assessment and Plan: Bipolar disorder type I.  Insomnia.  Patient mood is stable.  He like to have a letter stating that he is taking the Wellbutrin and Lamictal in the nighttime since he works at night and does not interfere with his sleep.  He does take mirtazapine for his sleep but when he comes home in the morning.  We discussed medication side effects.  Patient was also  screened at work by measurement of his neck which is less than 17 inch to rule out sleep apnea.  I discussed that apnea can be diagnosed with the people who are thin neck.  Patient denies any symptoms of apnea and he feels much comfortable on his medication.  He sleeps good since he is taking the mirtazapine 15 mg in the morning when he go to bed.  We will provide a letter stating that Lamictal and Wellbutrin does not cause sleep issues and does not interfere with his work.  Recommended to call us back if is any question or any concern.  Follow-up in 4 months.   Follow Up Instructions:    I discussed  the assessment and treatment plan with the patient. The patient was provided an opportunity to ask questions and all were answered. The patient agreed with the plan and demonstrated an understanding of the instructions.   The patient was advised to call back or seek an in-person evaluation if the symptoms worsen or if the condition fails to improve as anticipated.  Collaboration of Care: Other provider involved in patient's care AEB notes are available in epic to review.  Patient/Guardian was advised Release of Information must be obtained prior to any record release in order to collaborate their care with an outside provider. Patient/Guardian was advised if they have not already done so to contact the registration department to sign all necessary forms in order for [place] to release information regarding their care.   Consent: Patient/Guardian gives verbal consent for treatment and assignment of benefits for services provided during this visit. Patient/Guardian expressed understanding and agreed to proceed.    I provided 26 minutes of non-face-to-face time during this encounter.   Kathlee Nations, MD

## 2022-08-28 NOTE — Telephone Encounter (Signed)
Writer LVM for pt concerning letter for work. Pt given my direct number and request a call back.

## 2022-08-30 ENCOUNTER — Telehealth (HOSPITAL_COMMUNITY): Payer: Self-pay | Admitting: *Deleted

## 2022-08-30 NOTE — Telephone Encounter (Signed)
Work letter scanned to pt e-mail and copy made to go to [employer] for EMR.

## 2022-09-18 ENCOUNTER — Other Ambulatory Visit (HOSPITAL_BASED_OUTPATIENT_CLINIC_OR_DEPARTMENT_OTHER): Payer: Self-pay | Admitting: Family Medicine

## 2022-09-18 DIAGNOSIS — I10 Essential (primary) hypertension: Secondary | ICD-10-CM

## 2022-09-18 DIAGNOSIS — Z8249 Family history of ischemic heart disease and other diseases of the circulatory system: Secondary | ICD-10-CM

## 2022-10-09 ENCOUNTER — Ambulatory Visit (HOSPITAL_BASED_OUTPATIENT_CLINIC_OR_DEPARTMENT_OTHER)
Admission: RE | Admit: 2022-10-09 | Discharge: 2022-10-09 | Disposition: A | Discharge status: Home / Self Care | Payer: Managed Care, Other (non HMO) | Source: Ambulatory Visit | Attending: Family Medicine | Admitting: Family Medicine

## 2022-10-09 DIAGNOSIS — Z8249 Family history of ischemic heart disease and other diseases of the circulatory system: Secondary | ICD-10-CM | POA: Insufficient documentation

## 2022-10-09 DIAGNOSIS — I10 Essential (primary) hypertension: Secondary | ICD-10-CM | POA: Insufficient documentation

## 2022-10-18 ENCOUNTER — Ambulatory Visit
Admission: EM | Admit: 2022-10-18 | Discharge: 2022-10-18 | Disposition: A | Discharge status: Home / Self Care | Payer: Managed Care, Other (non HMO)

## 2022-10-18 DIAGNOSIS — R197 Diarrhea, unspecified: Secondary | ICD-10-CM

## 2022-10-18 MED ORDER — LOPERAMIDE HCL 2 MG PO TABS: 2.0000 mg | ORAL_TABLET | Freq: Four times a day (QID) | ORAL | 0 refills | Status: DC | PRN

## 2022-10-18 NOTE — ED Provider Notes (Signed)
Douglas Hester [facility]    CSN: [id number] Arrival date & time: 10/18/22  1121    HISTORY   Chief Complaint  Patient presents with   Diarrhea   HPI Douglas Hester is a pleasant, 62 y.o. male who presents to urgent care today. Pt c/o 6 daily episodes of diarrhea which appears to contain mucus but no blood, blood on toilet tissue after BM, burning sensation in rectum, and abd cramping, all of which began 3 days ago. Pt states that three days ago prior to onset of sx, he ate a chicken burrito from [employer] for lunch and ground beef for dinner.  Patient states he is continue to eat simple meals such as salad, eggs and toast.  Pt reports hx hemorrhoids.  Denies N/V, flatulence, SOB, dizziness, blood in stool, bloating, increased belching, chest pain.  EMR reviewed, pt had normal colonoscopy in 07/2021. Pt had his gallbladder removed 09/2018. Pt also has hx GAD and bipolar 1.   The history is provided by the patient.   Past Medical History:  Diagnosis Date   Anxiety    Chronic GERD    Hypertension    Patient Active Problem List   Diagnosis Date Noted   Biliary colic [id number]   Gallstones 10/11/2018   Hypokalemia 05/01/2017   Elevated LFTs 05/01/2017   Elevated lipase 05/01/2017   Chest pain 04/29/2017   Fever, unspecified 04/29/2017   Tachycardia 04/29/2017   Hyperglycemia 04/29/2017   Past Surgical History:  Procedure Laterality Date   CHOLECYSTECTOMY N/A 10/11/2018   Procedure: LAPAROSCOPIC CHOLECYSTECTOMY WITH INTRAOPERATIVE CHOLANGIOGRAM;  Surgeon: Jovita Kussmaul, MD;  Location: [facility];  Service: General;  Laterality: N/A;   COLONOSCOPY  ~ 2014   done by GI in [place]. Polyps were removed. Patient not aware what type of polyps these were   ESOPHAGOGASTRODUODENOSCOPY  early 1990s   MD found ulcer   INGUINAL HERNIA REPAIR Left ~ 2010   with Mesh.    VASECTOMY      Home Medications    Prior to Admission medications   Medication Sig Start Date End Date Taking?  Authorizing Provider  Aspirin-Salicylamide-Caffeine (BC HEADACHE POWDER PO) Take 1 packet by mouth every evening.    [provider]  buPROPion (WELLBUTRIN XL) 150 MG 24 hr tablet Take 1 tablet (150 mg total) by mouth daily. 08/28/22   Arfeen, Arlyce Harman, MD  Calcium Carbonate Antacid (TUMS PO) Take 4 tablets by mouth at bedtime as needed (heartburn).    [provider]  ibuprofen (ADVIL,MOTRIN) 200 MG tablet Take 400 mg by mouth daily as needed for moderate pain.    [provider]  lamoTRIgine (LAMICTAL) 200 MG tablet Take 1 tablet (200 mg total) by mouth daily. 08/28/22   Arfeen, Arlyce Harman, MD  lisinopril (ZESTRIL) 10 MG tablet Take 10 mg by mouth daily. 07/03/22   [provider]  Melatonin 5 MG TABS Take 5 mg by mouth at bedtime as needed (sleep).    [provider]  metoprolol tartrate (LOPRESSOR) 50 MG tablet TAKE 1 TABLET (50 MG TOTAL) BY MOUTH 2 (TWO) TIMES DAILY. START 2 WEEKS BEFORE CT SCAN. 08/04/22 01/31/23  Tolia, Sunit, DO  mirtazapine (REMERON) 15 MG tablet Take 1 tablet (15 mg total) by mouth as needed (for sleep). 08/28/22 08/28/23  Kathlee Nations, MD    Family History Family History  Problem Relation Age of Onset   Lung cancer Mother    Lymphoma Father    Valvular heart disease Sister  Social History Social History   Tobacco Use   Smoking status: Never   Smokeless tobacco: Never  Vaping Use   Vaping Use: Never used  Substance Use Topics   Alcohol use: Yes    Comment: rare   Drug use: No   Allergies   Patient has no known allergies.  Review of Systems Review of Systems Pertinent findings revealed after performing a 14 point review of systems has been noted in the history of present illness.  Physical Exam Vital Signs BP 118/75 (BP Location: Right Arm)   Pulse 77   Temp 98.2 F (36.8 C) (Oral)   Resp 18   SpO2 96%   No data found.  Physical Exam  Visual Acuity Right Eye Distance:   Left Eye Distance:   Bilateral  Distance:    Right Eye Near:   Left Eye Near:    Bilateral Near:     UC Couse / Diagnostics / Procedures:     Radiology No results found.  Procedures Procedures (including critical care time) EKG  Pending results:  Labs Reviewed - No data to display  Medications Ordered in UC: Medications - No data to display  UC Diagnoses / Final Clinical Impressions(s)   I have reviewed the triage vital signs and the nursing notes.  Pertinent labs & imaging results that were available during my care of the patient were reviewed by me and considered in my medical decision making (see chart for details).    Final diagnoses:  Diarrhea of presumed infectious origin   Patient provided with information regarding conservative care, diet modifications, rehydration, skin protection.  Return precautions advised.  Emergency precautions advised.  Please see discharge instructions below for details of plan of care as provided to patient. ED Prescriptions     Medication Sig Dispense Auth. Provider   loperamide (IMODIUM A-D) 2 MG tablet Take 1 tablet (2 mg total) by mouth 4 (four) times daily as needed for diarrhea or loose stools. 30 tablet Lynden Oxford Scales, PA-C      PDMP not reviewed this encounter.  Pending results:  Labs Reviewed - No data to display  Discharge Instructions:   Discharge Instructions      I have enclosed information about diarrhea and food choices to help relieve diarrhea that I hope you find helpful.  I believe that your initial symptoms were most likely related to some form of infectious diarrhea and at this point the diarrhea that you are experiencing is more due to bowel irritation.  Bowel rest is recommended.  Please be sure that you are consuming 8 ounces of electrolyte replacement fluid with each episode of diarrhea.  Please be sure that you are eating a bland diet for at least another 24 hours after your diarrhea has resolved.  For rectal irritation and  burning, consider purchasing a small tube of Desitin diaper cream which is an excellent moisture barrier and will protect the area from the acidity of diarrhea.  Please read below to learn more about the medications, dosages and frequencies that I recommend to help alleviate your symptoms and to get you feeling better soon:   Imodium (loperamide): This is a good antidiarrheal medication that you can take after every episode of diarrhea to help slow your bowels and reduce the volume as well as the frequency of diarrhea.  You take this medication up to 4 times daily.   Please follow-up within the next 7-10 days either with your primary care provider or urgent care if  your symptoms do not resolve.  If you do not have a primary care provider, we will assist you in finding one.        Thank you for visiting urgent care today.  We appreciate the opportunity to participate in your care.         Disposition Upon Discharge:  Condition: stable for discharge home  Patient presented with an acute illness with associated systemic symptoms and significant discomfort requiring urgent management. In my opinion, this is a condition that a prudent lay person (someone who possesses an average knowledge of health and medicine) may potentially expect to result in complications if not addressed urgently such as respiratory distress, impairment of bodily function or dysfunction of bodily organs.   Routine symptom specific, illness specific and/or disease specific instructions were discussed with the patient and/or caregiver at length.   As such, the patient has been evaluated and assessed, work-up was performed and treatment was provided in alignment with urgent care protocols and evidence based medicine.  Patient/parent/caregiver has been advised that the patient may require follow up for further testing and treatment if the symptoms continue in spite of treatment, as clinically indicated and  appropriate.  Patient/parent/caregiver has been advised to return to the [facility] or PCP if no better; to PCP or the Emergency Department if new signs and symptoms develop, or if the current signs or symptoms continue to change or worsen for further workup, evaluation and treatment as clinically indicated and appropriate  The patient will follow up with their current PCP if and as advised. If the patient does not currently have a PCP we will assist them in obtaining one.   The patient may need specialty follow up if the symptoms continue, in spite of conservative treatment and management, for further workup, evaluation, consultation and treatment as clinically indicated and appropriate.  Patient/parent/caregiver verbalized understanding and agreement of plan as discussed.  All questions were addressed during visit.  Please see discharge instructions below for further details of plan.  This office note has been dictated using Museum/gallery curator.  Unfortunately, this method of dictation can sometimes lead to typographical or grammatical errors.  I apologize for your inconvenience in advance if this occurs.  Please do not hesitate to reach out to me if clarification is needed.      Lynden Oxford Scales, PA-C 10/18/22 1153

## 2022-10-18 NOTE — ED Triage Notes (Signed)
Diarrhea, rectal pain, rectal bleeding with wiping mostly, rectal burning, abd cramping starting Sunday night. Pt reports chicken burrito from [employer] for lunch and ground beef for dinner from [employer] Sunday night. Hx of hemorrhoids No N/V/F, shortness of breath, dizziness or chest pain.

## 2022-10-18 NOTE — Discharge Instructions (Signed)
I have enclosed information about diarrhea and food choices to help relieve diarrhea that I hope you find helpful.  I believe that your initial symptoms were most likely related to some form of infectious diarrhea and at this point the diarrhea that you are experiencing is more due to bowel irritation.  Bowel rest is recommended.  Please be sure that you are consuming 8 ounces of electrolyte replacement fluid with each episode of diarrhea.  Please be sure that you are eating a bland diet for at least another 24 hours after your diarrhea has resolved.  For rectal irritation and burning, consider purchasing a small tube of Desitin diaper cream which is an excellent moisture barrier and will protect the area from the acidity of diarrhea.  Please read below to learn more about the medications, dosages and frequencies that I recommend to help alleviate your symptoms and to get you feeling better soon:   Imodium (loperamide): This is a good antidiarrheal medication that you can take after every episode of diarrhea to help slow your bowels and reduce the volume as well as the frequency of diarrhea.  You take this medication up to 4 times daily.   Please follow-up within the next 7-10 days either with your primary care provider or urgent care if your symptoms do not resolve.  If you do not have a primary care provider, we will assist you in finding one.        Thank you for visiting urgent care today.  We appreciate the opportunity to participate in your care.

## 2022-12-18 ENCOUNTER — Encounter (HOSPITAL_COMMUNITY): Payer: Self-pay | Admitting: Psychiatry

## 2022-12-18 ENCOUNTER — Telehealth (HOSPITAL_BASED_OUTPATIENT_CLINIC_OR_DEPARTMENT_OTHER): Payer: 59 | Admitting: Psychiatry

## 2022-12-18 DIAGNOSIS — G4700 Insomnia, unspecified: Secondary | ICD-10-CM | POA: Diagnosis not present

## 2022-12-18 DIAGNOSIS — F3130 Bipolar disorder, current episode depressed, mild or moderate severity, unspecified: Secondary | ICD-10-CM

## 2022-12-18 MED ORDER — LAMOTRIGINE 200 MG PO TABS: 200.0000 mg | ORAL_TABLET | Freq: Every day | ORAL | 0 refills | Status: DC

## 2022-12-18 MED ORDER — MIRTAZAPINE 15 MG PO TABS: 15.0000 mg | ORAL_TABLET | ORAL | 0 refills | Status: DC | PRN

## 2022-12-18 MED ORDER — BUPROPION HCL ER (XL) 150 MG PO TB24: 150.0000 mg | ORAL_TABLET | Freq: Every day | ORAL | 0 refills | Status: DC

## 2022-12-18 NOTE — Progress Notes (Signed)
[facility] MD Virtual Progress Note   Patient Location: Home Provider Location: Home Office  I connect with patient by telephone and verified that I am speaking with correct person by using two identifiers. I discussed the limitations of evaluation and management by telemedicine and the availability of in person appointments. I also discussed with the patient that there may be a patient responsible charge related to this service. The patient expressed understanding and agreed to proceed.  Douglas Hester [id number] 62 y.o.  12/18/2022 8:29 AM  History of Present Illness:  Patient is evaluated by phone session.  He is taking all his medication as prescribed.  He admitted sometimes having racing thoughts and poor sleep but mirtazapine helps.  He is thinking to buy a house but so far he has not able to find one.  He lives in an apartment.  He continues to involve in church activities denies any mania, psychosis, hallucination.  His appetite is okay.  His weight is stable.  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  Patient is a Naval architect.  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  He had a good friends and network.  Denies any tremors or shakes or any rash.  He is compliant with Wellbutrin and Lamictal.  Denies drinking or using any illegal substances.  Occasionally he takes melatonin when mirtazapine cannot help sleep.  He has a screening test at his work to rule out sleep apnea.  Past Psychiatric History: Saw Dr. Jennelle Human in past and diagnosed with bipolar and depression. Given Lamictal and Wellbutrin.  Rozerem did not work. PCP tried Prozac, Paxil (sexual side effects) and trazodone (did not work). Abilify never picked up. No h/o suicidal attempt, psychosis and inpatient treatment.     Outpatient Encounter Medications as of 12/18/2022  Medication Sig   Aspirin-Salicylamide-Caffeine (BC  HEADACHE POWDER PO) Take 1 packet by mouth every evening.   buPROPion (WELLBUTRIN XL) 150 MG 24 hr tablet Take 1 tablet (150 mg total) by mouth daily.   Calcium Carbonate Antacid (TUMS PO) Take 4 tablets by mouth at bedtime as needed (heartburn).   ibuprofen (ADVIL,MOTRIN) 200 MG tablet Take 400 mg by mouth daily as needed for moderate pain.   lamoTRIgine (LAMICTAL) 200 MG tablet Take 1 tablet (200 mg total) by mouth daily.   lisinopril (ZESTRIL) 10 MG tablet Take 10 mg by mouth daily.   loperamide (IMODIUM A-D) 2 MG tablet Take 1 tablet (2 mg total) by mouth 4 (four) times daily as needed for diarrhea or loose stools.   Melatonin 5 MG TABS Take 5 mg by mouth at bedtime as needed (sleep).   metoprolol succinate (TOPROL-XL) 25 MG 24 hr tablet 1 tablet Orally Once a day for 30 days   metoprolol tartrate (LOPRESSOR) 50 MG tablet TAKE 1 TABLET (50 MG TOTAL) BY MOUTH 2 (TWO) TIMES DAILY. START 2 WEEKS BEFORE CT SCAN.   mirtazapine (REMERON) 15 MG tablet Take 1 tablet (15 mg total) by mouth as needed (for sleep).   olmesartan (BENICAR) 5 MG tablet Take 5 mg by mouth daily.   No facility-administered encounter medications on file as of 12/18/2022.    No results found for this or any previous visit (from the past 2160 hour(s)).   Psychiatric Specialty Exam: Physical Exam  Review of Systems  Constitutional: Negative.   Neurological: Negative.  Weight 218 lb (98.9 kg).There is no height or weight on file to calculate BMI.  General Appearance: NA  Eye Contact:  NA  Speech:  Clear and Coherent and Normal Rate  Volume:  Normal  Mood:  Euthymic  Affect:  NA  Thought Process:  Goal Directed  Orientation:  Full (Time, Place, and Person)  Thought Content:  Logical  Suicidal Thoughts:  No  Homicidal Thoughts:  No  Memory:  Immediate;   Good Recent;   Good Remote;   Good  Judgement:  Good  Insight:  Present  Psychomotor Activity:  NA  Concentration:  Concentration: Good and Attention Span:  Good  Recall:  Good  Fund of Knowledge:  Good  Language:  Good  Akathisia:  No  Handed:  Right  AIMS (if indicated):     Assets:  Communication Skills Desire for Improvement Housing Resilience Social Support Talents/Skills Transportation  ADL's:  Intact  Cognition:  WNL  Sleep:  fair     Assessment/Plan: Bipolar I disorder, most recent episode depressed (HCC) - Plan: buPROPion (WELLBUTRIN XL) 150 MG 24 hr tablet, lamoTRIgine (LAMICTAL) 200 MG tablet  Insomnia, unspecified type - Plan: mirtazapine (REMERON) 15 MG tablet  Patient is stable on his current medication.  His job is going well.  He has no mania or psychosis or any impulsive behavior.  Continue Wellbutrin XL 150 mg daily, Lamictal 200 mg daily and mirtazapine 15 mg at bedtime.  He occasionally takes melatonin and that he cannot sleep work mirtazapine.  Discussed medication side effects and benefits.  Recommended to call us back if he has any question or any concern.  Follow-up in 3 months.   Follow Up Instructions:     I discussed the assessment and treatment plan with the patient. The patient was provided an opportunity to ask questions and all were answered. The patient agreed with the plan and demonstrated an understanding of the instructions.   The patient was advised to call back or seek an in-person evaluation if the symptoms worsen or if the condition fails to improve as anticipated.    Collaboration of Care: Other provider involved in patient's care AEB notes are available in epic to review.  Patient/Guardian was advised Release of Information must be obtained prior to any record release in order to collaborate their care with an outside provider. Patient/Guardian was advised if they have not already done so to contact the registration department to sign all necessary forms in order for [place] to release information regarding their care.   Consent: Patient/Guardian gives verbal consent for treatment and assignment  of benefits for services provided during this visit. Patient/Guardian expressed understanding and agreed to proceed.     I provided 16 minutes of non face to face time during this encounter.  Note: This document was prepared by [employer] voice dictation technology and any errors that results from this process are unintentional.    Cleotis Nipper, MD 12/18/2022

## 2022-12-19 ENCOUNTER — Emergency Department (HOSPITAL_COMMUNITY): Payer: Worker's Comp, Other unspecified

## 2022-12-19 ENCOUNTER — Emergency Department (HOSPITAL_COMMUNITY): Payer: Worker's Compensation

## 2022-12-19 ENCOUNTER — Encounter (HOSPITAL_COMMUNITY): Payer: Self-pay

## 2022-12-19 ENCOUNTER — Emergency Department
Admission: EM | Admit: 2022-12-19 | Discharge: 2022-12-19 | Disposition: A | Discharge status: Home / Self Care | Payer: Worker's Compensation | Attending: Emergency Medicine | Admitting: Emergency Medicine

## 2022-12-19 ENCOUNTER — Other Ambulatory Visit: Payer: Self-pay

## 2022-12-19 DIAGNOSIS — S161XXA Strain of muscle, fascia and tendon at neck level, initial encounter: Secondary | ICD-10-CM | POA: Insufficient documentation

## 2022-12-19 DIAGNOSIS — Z23 Encounter for immunization: Secondary | ICD-10-CM | POA: Insufficient documentation

## 2022-12-19 DIAGNOSIS — W19XXXA Unspecified fall, initial encounter: Secondary | ICD-10-CM | POA: Insufficient documentation

## 2022-12-19 DIAGNOSIS — Y990 Civilian activity done for income or pay: Secondary | ICD-10-CM | POA: Insufficient documentation

## 2022-12-19 HISTORY — DX: Essential (primary) hypertension: I10

## 2022-12-19 HISTORY — DX: Unspecified abnormalities of heart beat: R00.9

## 2022-12-19 MED ORDER — DIPHTH,PERTUSSIS(ACEL),TETANUS 2.5 LF UNIT-8 MCG-5 LF/0.5ML IM SYRINGE
0.5000 mL | INJECTION | INTRAMUSCULAR | Status: AC
Start: 2022-12-19 — End: 2022-12-19
  Administered 2022-12-19: 0.5 mL via INTRAMUSCULAR
  Filled 2022-12-19: qty 0.5

## 2022-12-19 MED ORDER — IBUPROFEN 400 MG TABLET
800.0000 mg | ORAL_TABLET | ORAL | Status: AC
Start: 2022-12-19 — End: 2022-12-19
  Administered 2022-12-19: 800 mg via ORAL
  Filled 2022-12-19: qty 2

## 2022-12-19 NOTE — ED Nurses Note (Signed)
Patients company is sending a person to pick him up and transport home.  Have discharged him and placed him in room 5 until he has transpiration to home.  Patient voices understanding of discharge instructions.  Signs and symptoms that are reasons to return to ED.  Follow up with PCP.  Medications to be taken.  AVS given.

## 2022-12-19 NOTE — Discharge Instructions (Signed)
Rest, tylenol/motrin for fever/pain, return to ED for any worsening/persisting symptoms.

## 2022-12-19 NOTE — ED Nurses Note (Signed)
Patient has been talking to his company as to logistics on returning to [facility].  Advised to see when and where his ride is coming from and if he needs to stay here or go to hotel.

## 2022-12-19 NOTE — ED Triage Notes (Signed)
62 yo male who fell off the top step of his tractor trailer and landed backwards on his back hitting his head on the asphalt    Presents with abrasions to back of head and neck pain    C Collar in place   No LOC  arrived via [facility]

## 2022-12-19 NOTE — ED Nurses Note (Signed)
Patient is alert and oriented to time and place.  Skin warm and pink  c/o some pain in neck about 5/10  GCS is 15.

## 2022-12-19 NOTE — ED Provider Notes (Signed)
Emergency Department      Thereasa Parkin DO, FACEP, [place], FAOAAM    12/19/2022         Clinical Impression   None             Disposition: Data Unavailable        Current Outpatient Medications   Medication Sig    lisinopriL (PRINIVIL) 10 mg Oral Tablet Take 1 Tablet (10 mg total) by mouth Once a day    metoprolol succinate (TOPROL-XL) 25 mg Oral Tablet Sustained Release 24 hr Take 1 Tablet (25 mg total) by mouth Once a day                 Chief Complaint:  Patient presents with     Chief Complaint   Patient presents with    Fall           HPI    Charles Wade, date of birth [birth year], is a 62 y.o. male who presents to the Emergency Department complaining of fall striking head.               Vitals:  Filed Vitals:    12/19/22 0524   BP: (!) 140/108   Pulse: 88   Resp: 20   Temp: 36.3 C (97.3 F)   SpO2: 98%           Physical Exam                           Past Medical History:  Diagnosis     Past Medical History:   Diagnosis Date    Abnormal heart rate     HTN (hypertension)        Past Surgical History:  Past Surgical History:   Procedure Laterality Date    Hx cholecystectomy      Hx hernia repair      Hx tonsillectomy             Family History: No family history on file.    Social History     Social History     Tobacco Use    Smoking status: Never    Smokeless tobacco: Never   Substance Use Topics    Alcohol use: Never    Drug use: Never         Social History Main Topics     Social History     Substance and Sexual Activity   Drug Use Never           No Known Allergies        Vital Signs:  Pre-disposition vitals  Filed Vitals:    12/19/22 0524   BP: (!) 140/108   Pulse: 88   Resp: 20   Temp: 36.3 C (97.3 F)   SpO2: 98%       Old records reviewed if available (and display)        Diagnostics:    Labs:  No results found for this or any previous visit (from the past 12 hour(s)).  Labs reviewed and interpreted by me.    Radiology:  Results for orders placed or performed during the hospital encounter of 12/19/22   CT BRAIN WO IV CONTRAST     Status: None    Narrative    Marton Lobdell    PROCEDURE DESCRIPTION: CT BRAIN WO IV CONTRAST    CLINICAL INDICATION: headache    COMPARISON: No prior studies were compared.      FINDINGS: There is no acute intracranial hemorrhage, mass, midline shift, or extra-axial fluid collection. Ventricles are normal in size. There is no loss of gray-white differentiation to suggest an acute infarct. The bones and subcutaneous soft tissues are unremarkable.      Impression    No acute intracranial abnormality.            The CT exam was performed using one or more the following a dose reduction techniques: Automated exposure control, adjustment of the mA and/or kV according to the patient's size, or use of iterative reconstruction technique.          Radiologist location ID: [id number]     CT CERVICAL SPINE WO IV CONTRAST     Status: None    Narrative    Aadit Moroney    PROCEDURE DESCRIPTION: CT CERVICAL SPINE WO IV CONTRAST    CLINICAL INDICATION: neck pain    COMPARISON: No prior studies were compared.      FINDINGS: There is straightening of normal cervical lordosis. Vertebral body heights are preserved with no evidence of acute fracture or dislocation. There is mild degenerative change in the spine. Included portions of the lungs are clear. The soft tissues of the neck are unremarkable.      Impression    1. No acute abnormality of the cervical spine.  2. Mild degenerative changes of the spine.            The CT exam was performed using one or more the following a dose reduction techniques: Automated exposure control, adjustment of the mA and/or kV according to the patient's size, or use of iterative reconstruction technique.          Radiologist location ID: [id number]             EKG:  EKG reviewed, See MUSE/Tracemaster for official interp        All  Diagnostic studies were reviewed with patient and/or family at bedside.     ED Course/Medical Decision Making/Consults/Procedures:           Procedures        Medical Decision Making  Amount and/or Complexity of Data Reviewed  Radiology: ordered.    Risk  Prescription drug management.                  Orders:  Orders Placed This Encounter    CT BRAIN WO IV CONTRAST    CT CERVICAL SPINE WO IV CONTRAST    diphtheria, pertussis-acell, tetanus (BOOSTRIX) IM injection             Thereasa Parkin DO, FACEP, [place], [place]

## 2022-12-27 ENCOUNTER — Telehealth (HOSPITAL_COMMUNITY): Payer: 59 | Admitting: Psychiatry

## 2023-03-19 ENCOUNTER — Encounter (HOSPITAL_COMMUNITY): Payer: Self-pay | Admitting: Psychiatry

## 2023-03-19 ENCOUNTER — Other Ambulatory Visit (HOSPITAL_COMMUNITY): Payer: Self-pay | Admitting: Psychiatry

## 2023-03-19 ENCOUNTER — Telehealth (HOSPITAL_COMMUNITY): Payer: 59 | Admitting: Psychiatry

## 2023-03-19 VITALS — Wt 218.0 lb

## 2023-03-19 DIAGNOSIS — G4700 Insomnia, unspecified: Secondary | ICD-10-CM

## 2023-03-19 DIAGNOSIS — F3130 Bipolar disorder, current episode depressed, mild or moderate severity, unspecified: Secondary | ICD-10-CM

## 2023-03-19 DIAGNOSIS — F319 Bipolar disorder, unspecified: Secondary | ICD-10-CM

## 2023-03-19 MED ORDER — BUPROPION HCL ER (XL) 150 MG PO TB24: 150.0000 mg | ORAL_TABLET | Freq: Every day | ORAL | 0 refills | Status: DC

## 2023-03-19 MED ORDER — LAMOTRIGINE 200 MG PO TABS: 200.0000 mg | ORAL_TABLET | Freq: Every day | ORAL | 0 refills | Status: DC

## 2023-03-19 MED ORDER — MIRTAZAPINE 15 MG PO TABS: 15.0000 mg | ORAL_TABLET | ORAL | 0 refills | Status: DC | PRN

## 2023-03-19 NOTE — Progress Notes (Signed)
[facility] MD Virtual Progress Note   Patient Location: Home Provider Location: Home Office  I connect with patient by telephone and verified that I am speaking with correct person by using two identifiers. I discussed the limitations of evaluation and management by telemedicine and the availability of in person appointments. I also discussed with the patient that there may be a patient responsible charge related to this service. The patient expressed understanding and agreed to proceed.  Douglas Hester [id number] 62 y.o.  03/19/2023 8:24 AM  History of Present Illness:  Patient is evaluated by phone session.  He is taking Wellbutrin, Lamictal and mirtazapine.  He reported sometimes difficulty sleeping on the weekends.  He does not have a regular routine and he does not work on the weekends.  He continues to be involved in church activities.  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  He reported things are okay.  He is not able to find a house and recently lease for another 1 year.  He is hoping next year he may consider buying a house as interest rates are very high.  He denies any irritability, anger, mania, psychosis or any hallucination.  His appetite is okay.  His weight is stable.  Recently had a visit with his primary care and his blood pressure medicines were changed from lisinopril to Benicar.  He has no tremor or shakes or any rash.  He like to keep the current medication.  He takes melatonin some nights when he cannot sleep along with mirtazapine.  Past Psychiatric History: Saw Dr. Jennelle Human in past and diagnosed with bipolar and depression. Given Lamictal and Wellbutrin.  Rozerem did not work. PCP tried Prozac, Paxil (sexual side effects) and trazodone (did not work). Abilify never picked up. No h/o suicidal attempt, psychosis and inpatient treatment.     Outpatient Encounter Medications as of 03/19/2023  Medication Sig   Aspirin-Salicylamide-Caffeine (BC  HEADACHE POWDER PO) Take 1 packet by mouth every evening.   buPROPion (WELLBUTRIN XL) 150 MG 24 hr tablet Take 1 tablet (150 mg total) by mouth daily.   ibuprofen (ADVIL,MOTRIN) 200 MG tablet Take 400 mg by mouth daily as needed for moderate pain.   lamoTRIgine (LAMICTAL) 200 MG tablet Take 1 tablet (200 mg total) by mouth daily.   loperamide (IMODIUM A-D) 2 MG tablet Take 1 tablet (2 mg total) by mouth 4 (four) times daily as needed for diarrhea or loose stools.   Melatonin 5 MG TABS Take 5 mg by mouth at bedtime as needed (sleep).   metoprolol succinate (TOPROL-XL) 25 MG 24 hr tablet 1 tablet Orally Once a day for 30 days   mirtazapine (REMERON) 15 MG tablet Take 1 tablet (15 mg total) by mouth as needed (for sleep).   olmesartan (BENICAR) 5 MG tablet Take 5 mg by mouth daily.   [DISCONTINUED] buPROPion (WELLBUTRIN XL) 150 MG 24 hr tablet Take 1 tablet (150 mg total) by mouth daily.   [DISCONTINUED] Calcium Carbonate Antacid (TUMS PO) Take 4 tablets by mouth at bedtime as needed (heartburn).   [DISCONTINUED] lamoTRIgine (LAMICTAL) 200 MG tablet Take 1 tablet (200 mg total) by mouth daily.   [DISCONTINUED] lisinopril (ZESTRIL) 10 MG tablet Take 10 mg by mouth daily.   [DISCONTINUED] metoprolol tartrate (LOPRESSOR) 50 MG tablet TAKE 1 TABLET (50 MG TOTAL) BY MOUTH 2 (TWO) TIMES DAILY. START 2 WEEKS BEFORE CT SCAN.   [DISCONTINUED] mirtazapine (REMERON) 15 MG tablet Take 1 tablet (15 mg total)  by mouth as needed (for sleep).   No facility-administered encounter medications on file as of 03/19/2023.    No results found for this or any previous visit (from the past 2160 hour(s)).   Psychiatric Specialty Exam: Physical Exam  Review of Systems  Weight 218 lb (98.9 kg).There is no height or weight on file to calculate BMI.  General Appearance: NA  Eye Contact:  NA  Speech:  Clear and Coherent and Normal Rate  Volume:  Normal  Mood:  Euthymic  Affect:  Appropriate  Thought Process:  Goal  Directed  Orientation:  Full (Time, Place, and Person)  Thought Content:  Logical  Suicidal Thoughts:  No  Homicidal Thoughts:  No  Memory:  Immediate;   Good Recent;   Good Remote;   Good  Judgement:  Good  Insight:  Good  Psychomotor Activity:  Normal  Concentration:  Concentration: Good and Attention Span: Good  Recall:  Good  Fund of Knowledge:  Good  Language:  Good  Akathisia:  No  Handed:  Right  AIMS (if indicated):     Assets:  Communication Skills Desire for Improvement Housing Resilience Social Support Talents/Skills Transportation  ADL's:  Intact  Cognition:  WNL  Sleep:  good on weeknights     Assessment/Plan: Bipolar I disorder, most recent episode depressed (HCC) - Plan: buPROPion (WELLBUTRIN XL) 150 MG 24 hr tablet, lamoTRIgine (LAMICTAL) 200 MG tablet  Insomnia, unspecified type - Plan: mirtazapine (REMERON) 15 MG tablet  Patient is stable on his current medication.  He has no rash, itching.  His mood is stable.  Continue Wellbutrin XL 150 mg daily, Lamictal 200 mg daily and mirtazapine 15 mg at bedtime.  He also takes melatonin when mirtazapine alone does not work.  Recommended to call us back if there is any question or any concern.  Follow-up in 3 months.   Follow Up Instructions:     I discussed the assessment and treatment plan with the patient. The patient was provided an opportunity to ask questions and all were answered. The patient agreed with the plan and demonstrated an understanding of the instructions.   The patient was advised to call back or seek an in-person evaluation if the symptoms worsen or if the condition fails to improve as anticipated.    Collaboration of Care: Other provider involved in patient's care AEB notes are available in epic to review.  Patient/Guardian was advised Release of Information must be obtained prior to any record release in order to collaborate their care with an outside provider. Patient/Guardian was  advised if they have not already done so to contact the registration department to sign all necessary forms in order for [place] to release information regarding their care.   Consent: Patient/Guardian gives verbal consent for treatment and assignment of benefits for services provided during this visit. Patient/Guardian expressed understanding and agreed to proceed.     I provided 16 minutes of non face to face time during this encounter.  Note: This document was prepared by [employer] voice dictation technology and any errors that results from this process are unintentional.    Cleotis Nipper, MD 03/19/2023

## 2023-03-20 ENCOUNTER — Telehealth (HOSPITAL_COMMUNITY): Payer: 59 | Admitting: Psychiatry

## 2023-06-26 ENCOUNTER — Other Ambulatory Visit (HOSPITAL_COMMUNITY): Payer: Self-pay | Admitting: Psychiatry

## 2023-06-26 DIAGNOSIS — F3130 Bipolar disorder, current episode depressed, mild or moderate severity, unspecified: Secondary | ICD-10-CM

## 2023-06-26 DIAGNOSIS — G4700 Insomnia, unspecified: Secondary | ICD-10-CM

## 2023-07-02 ENCOUNTER — Other Ambulatory Visit (HOSPITAL_COMMUNITY): Payer: Self-pay

## 2023-07-02 DIAGNOSIS — F3130 Bipolar disorder, current episode depressed, mild or moderate severity, unspecified: Secondary | ICD-10-CM

## 2023-07-02 MED ORDER — BUPROPION HCL ER (XL) 150 MG PO TB24: 150.0000 mg | ORAL_TABLET | Freq: Every day | ORAL | 0 refills | Status: DC

## 2023-07-02 MED ORDER — LAMOTRIGINE 200 MG PO TABS: 200.0000 mg | ORAL_TABLET | Freq: Every day | ORAL | 0 refills | Status: DC

## 2023-07-16 ENCOUNTER — Telehealth (HOSPITAL_COMMUNITY): Payer: 59 | Admitting: Psychiatry

## 2023-07-16 ENCOUNTER — Encounter (HOSPITAL_COMMUNITY): Payer: Self-pay | Admitting: Psychiatry

## 2023-07-16 VITALS — Wt 215.0 lb

## 2023-07-16 DIAGNOSIS — F3130 Bipolar disorder, current episode depressed, mild or moderate severity, unspecified: Secondary | ICD-10-CM | POA: Diagnosis not present

## 2023-07-16 DIAGNOSIS — G4700 Insomnia, unspecified: Secondary | ICD-10-CM

## 2023-07-16 MED ORDER — LAMOTRIGINE 150 MG PO TABS: 150.0000 mg | ORAL_TABLET | Freq: Every day | ORAL | 2 refills | Status: DC

## 2023-07-16 MED ORDER — BUPROPION HCL ER (XL) 150 MG PO TB24: 150.0000 mg | ORAL_TABLET | Freq: Every day | ORAL | 0 refills | Status: DC

## 2023-07-16 MED ORDER — MIRTAZAPINE 15 MG PO TABS: 15.0000 mg | ORAL_TABLET | ORAL | 0 refills | Status: DC | PRN

## 2023-07-16 NOTE — Progress Notes (Signed)
[name]   Patient Location: Home Provider Location: Home Office  I connect with patient by telephone and verified that I am speaking with correct person by using two identifiers. I discussed the limitations of evaluation and management by telemedicine and the availability of in person appointments. I also discussed with the patient that there may be a patient responsible charge related to this service. The patient expressed understanding and agreed to proceed.  Douglas Hester [id number] 62 y.o.  07/16/2023 10:28 AM  History of Present Illness:  Patient is evaluated by phone session.  He reported symptoms are stable and denies any recent mania, impulsive behavior, agitation or mood swings.  He works night shift and tried to catch up for sleep on the weekends.  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  He is involved in church and when he has a time he tried to spend in church related activities.  He has no tremors, shakes or any EPS.  He noticed recently having hallucination going before to sleep but otherwise denies any paranoia, voices or suicidal thoughts.  He reported hallucinations are mostly few minutes falling asleep.  He is not sure what causing it but reported his mood is stable.  He sleeps good.  No new medication added since the last visit.  His appetite is okay.  His weight is stable.  Denies drinking or using any illegal substances.  He takes sometime melatonin but usually he sleeps good with the help of medication.  He is on mirtazapine, Lamictal and Wellbutrin.  He has no rash or any itching.  Past Psychiatric History: Saw Dr. Jennelle Human in past and diagnosed with bipolar and depression. Given Lamictal and Wellbutrin.  Rozerem did not work. PCP tried Prozac, Paxil (sexual side effects) and trazodone (did not work). Abilify never picked up. No h/o suicidal attempt, psychosis and inpatient treatment.     Outpatient Encounter Medications  as of 07/16/2023  Medication Sig   Aspirin-Salicylamide-Caffeine (BC HEADACHE POWDER PO) Take 1 packet by mouth every evening.   buPROPion (WELLBUTRIN XL) 150 MG 24 hr tablet Take 1 tablet (150 mg total) by mouth daily. Bridge to patient appt 12/16   ibuprofen (ADVIL,MOTRIN) 200 MG tablet Take 400 mg by mouth daily as needed for moderate pain.   lamoTRIgine (LAMICTAL) 200 MG tablet Take 1 tablet (200 mg total) by mouth daily. Bridge to patient appt 12/16   loperamide (IMODIUM A-D) 2 MG tablet Take 1 tablet (2 mg total) by mouth 4 (four) times daily as needed for diarrhea or loose stools.   Melatonin 5 MG TABS Take 5 mg by mouth at bedtime as needed (sleep).   metoprolol succinate (TOPROL-XL) 25 MG 24 hr tablet 1 tablet Orally Once a day for 30 days   mirtazapine (REMERON) 15 MG tablet Take 1 tablet (15 mg total) by mouth as needed (for sleep).   olmesartan (BENICAR) 5 MG tablet Take 5 mg by mouth daily.   No facility-administered encounter medications on file as of 07/16/2023.    No results found for this or any previous visit (from the past 2160 hours).   Psychiatric Specialty Exam: Physical Exam  Review of Systems  Weight 215 lb (97.5 kg).There is no height or weight on file to calculate BMI.  General Appearance: NA  Eye Contact:  NA  Speech:  Normal Rate  Volume:  Normal  Mood:  Euthymic  Affect:  NA  Thought Process:  Goal Directed  Orientation:  Full (Time, Place, and Person)  Thought Content:  WDL  Suicidal Thoughts:  No  Homicidal Thoughts:  No  Memory:  Immediate;   Good Recent;   Good Remote;   Good  Judgement:  Intact  Insight:  Present  Psychomotor Activity:  Normal  Concentration:  Concentration: Good and Attention Span: Good  Recall:  Good  Fund of Knowledge:  Good  Language:  Good  Akathisia:  No  Handed:  Right  AIMS (if indicated):     Assets:  Communication Skills Desire for Improvement Housing Resilience Social  Support Talents/Skills Transportation  ADL's:  Intact  Cognition:  WNL  Sleep:  good night sleep especially weekends     Assessment/Plan: Bipolar I disorder, most recent episode depressed (HCC) - Plan: buPROPion (WELLBUTRIN XL) 150 MG 24 hr tablet, lamoTRIgine (LAMICTAL) 150 MG tablet  Insomnia, unspecified type - Plan: mirtazapine (REMERON) 15 MG tablet  Discussed hallucination before going to sleep.  Could be hypnagogic.  Recommend to cut down the Lamictal since mood is stable.  If hallucinations continue to get more frequently and causing functioning his daily life then consider medication adjustment.  I also consider having a sleep study in the future if needed.  Patient agreed with the plan.  We will reduce Lamictal 150 mg daily, continue Wellbutrin XL 150 mg daily and mirtazapine 15 mg at bedtime.  Recommended to call us back with any question or any concern.  Follow-up in 3 months.   Follow Up Instructions:     I discussed the assessment and treatment plan with the patient. The patient was provided an opportunity to ask questions and all were answered. The patient agreed with the plan and demonstrated an understanding of the instructions.   The patient was advised to call back or seek an in-person evaluation if the symptoms worsen or if the condition fails to improve as anticipated.    Collaboration of Care: Other provider involved in patient's care AEB notes are available in epic to review  Patient/Guardian was advised Release of Information must be obtained prior to any record release in order to collaborate their care with an outside provider. Patient/Guardian was advised if they have not already done so to contact the registration department to sign all necessary forms in order for [place] to release information regarding their care.   Consent: Patient/Guardian gives verbal consent for treatment and assignment of benefits for services provided during this visit. Patient/Guardian  expressed understanding and agreed to proceed.     I provided 20 minutes of non face to face time during this encounter.  Note: This document was prepared by [employer] voice dictation technology and any errors that results from this process are unintentional.    Cleotis Nipper, MD 07/16/2023

## 2023-10-12 ENCOUNTER — Other Ambulatory Visit (HOSPITAL_COMMUNITY): Payer: Self-pay | Admitting: Psychiatry

## 2023-10-12 DIAGNOSIS — F3130 Bipolar disorder, current episode depressed, mild or moderate severity, unspecified: Secondary | ICD-10-CM

## 2023-10-15 ENCOUNTER — Telehealth (HOSPITAL_BASED_OUTPATIENT_CLINIC_OR_DEPARTMENT_OTHER): Payer: 59 | Admitting: Psychiatry

## 2023-10-15 ENCOUNTER — Encounter (HOSPITAL_COMMUNITY): Payer: Self-pay | Admitting: Psychiatry

## 2023-10-15 VITALS — Wt 217.0 lb

## 2023-10-15 DIAGNOSIS — G4700 Insomnia, unspecified: Secondary | ICD-10-CM

## 2023-10-15 DIAGNOSIS — F3130 Bipolar disorder, current episode depressed, mild or moderate severity, unspecified: Secondary | ICD-10-CM

## 2023-10-15 MED ORDER — MIRTAZAPINE 15 MG PO TABS: 15.0000 mg | ORAL_TABLET | ORAL | 0 refills | Status: DC | PRN

## 2023-10-15 MED ORDER — BUPROPION HCL ER (XL) 150 MG PO TB24: 150.0000 mg | ORAL_TABLET | Freq: Every day | ORAL | 0 refills | Status: DC

## 2023-10-15 MED ORDER — LAMOTRIGINE 150 MG PO TABS: 150.0000 mg | ORAL_TABLET | Freq: Every day | ORAL | 2 refills | Status: DC

## 2023-10-15 NOTE — Progress Notes (Signed)
 [facility] MD Virtual Progress Note   Patient Location: Home Provider Location: Home Office  I connect with patient by telephone and verified that I am speaking with correct person by using two identifiers. I discussed the limitations of evaluation and management by telemedicine and the availability of in person appointments. I also discussed with the patient that there may be a patient responsible charge related to this service. The patient expressed understanding and agreed to proceed.  Douglas Hester [id number] 63 y.o.  10/15/2023 10:21 AM  History of Present Illness:  Patient is evaluated by phone session.  He has not set up MyChart account yet.  Overall he feels things are going very well.  He had cut down his Lamictal on the last visit and he is sleeping better.  He denies any hallucination.  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  He denies any mania, psychosis, hallucination.  He is busy with church on the weekends and he has very good friends and support system.  He has no tremors shakes or any EPS.  He recently had a visit with his primary care Jarrett Soho at [facility] physician and no new medicine added.  Patient told he has a blood work and he was told everything is good.  He denies drinking or using any illegal substances.  Denies any panic attack, crying spells or any feeling of hopelessness or worthlessness.  Occasionally he takes melatonin.  His appetite is okay and weight is stable.  Past Psychiatric History: Saw Dr. Jennelle Human in past and diagnosed with bipolar and depression. Given Lamictal and Wellbutrin.  Rozerem did not work. PCP tried Prozac, Paxil (sexual side effects) and trazodone (did not work). Abilify never picked up. No h/o suicidal attempt, psychosis and inpatient treatment.     Outpatient Encounter Medications as of 10/15/2023  Medication Sig   Aspirin-Salicylamide-Caffeine (BC HEADACHE POWDER PO) Take 1 packet by mouth every  evening.   buPROPion (WELLBUTRIN XL) 150 MG 24 hr tablet Take 1 tablet (150 mg total) by mouth daily. Bridge to patient appt 12/16   ibuprofen (ADVIL,MOTRIN) 200 MG tablet Take 400 mg by mouth daily as needed for moderate pain.   lamoTRIgine (LAMICTAL) 150 MG tablet Take 1 tablet (150 mg total) by mouth daily.   loperamide (IMODIUM A-D) 2 MG tablet Take 1 tablet (2 mg total) by mouth 4 (four) times daily as needed for diarrhea or loose stools.   Melatonin 5 MG TABS Take 5 mg by mouth at bedtime as needed (sleep).   metoprolol succinate (TOPROL-XL) 25 MG 24 hr tablet 1 tablet Orally Once a day for 30 days   mirtazapine (REMERON) 15 MG tablet Take 1 tablet (15 mg total) by mouth as needed (for sleep).   olmesartan (BENICAR) 5 MG tablet Take 5 mg by mouth daily.   No facility-administered encounter medications on file as of 10/15/2023.    No results found for this or any previous visit (from the past 2160 hours).   Psychiatric Specialty Exam: Physical Exam  Review of Systems  Weight 217 lb (98.4 kg).There is no height or weight on file to calculate BMI.  General Appearance: NA  Eye Contact:  NA  Speech:  Normal Rate  Volume:  Normal  Mood:  Euthymic  Affect:  Appropriate  Thought Process:  Goal Directed  Orientation:  Full (Time, Place, and Person)  Thought Content:  WDL  Suicidal Thoughts:  No  Homicidal Thoughts:  No  Memory:  Immediate;  Good Recent;   Good Remote;   Good  Judgement:  Intact  Insight:  Present  Psychomotor Activity:  NA  Concentration:  Concentration: Good and Attention Span: Good  Recall:  Good  Fund of Knowledge:  Good  Language:  Good  Akathisia:  No  Handed:  Right  AIMS (if indicated):     Assets:  Communication Skills Desire for Improvement Housing Talents/Skills Transportation  ADL's:  Intact  Cognition:  WNL  Sleep:  better     Assessment/Plan: Bipolar I disorder, most recent episode depressed (HCC) - Plan: buPROPion (WELLBUTRIN XL) 150  MG 24 hr tablet, lamoTRIgine (LAMICTAL) 150 MG tablet  Insomnia, unspecified type - Plan: mirtazapine (REMERON) 15 MG tablet  Patient is a stable and like to keep his current medication.  We had cut down the Lamictal 3 months ago and so far his mood is stable and he has no rash, itching, mania.  Continue Wellbutrin XL 150 mg daily, mirtazapine 15 mg at bedtime and Lamictal 150 mg daily.  Recommended to call us back if is any question or any concern.  Follow-up in 3 months.   Follow Up Instructions:     I discussed the assessment and treatment plan with the patient. The patient was provided an opportunity to ask questions and all were answered. The patient agreed with the plan and demonstrated an understanding of the instructions.   The patient was advised to call back or seek an in-person evaluation if the symptoms worsen or if the condition fails to improve as anticipated.    Collaboration of Care: Other provider involved in patient's care AEB notes are available in epic to review  Patient/Guardian was advised Release of Information must be obtained prior to any record release in order to collaborate their care with an outside provider. Patient/Guardian was advised if they have not already done so to contact the registration department to sign all necessary forms in order for [place] to release information regarding their care.   Consent: Patient/Guardian gives verbal consent for treatment and assignment of benefits for services provided during this visit. Patient/Guardian expressed understanding and agreed to proceed.     I provided 18 minutes of non face to face time during this encounter.  Note: This document was prepared by [employer] voice dictation technology and any errors that results from this process are unintentional.    Cleotis Nipper, MD 10/15/2023

## 2023-10-16 ENCOUNTER — Other Ambulatory Visit (HOSPITAL_COMMUNITY): Payer: Self-pay | Admitting: Psychiatry

## 2023-10-16 DIAGNOSIS — F3130 Bipolar disorder, current episode depressed, mild or moderate severity, unspecified: Secondary | ICD-10-CM

## 2024-01-02 ENCOUNTER — Other Ambulatory Visit (HOSPITAL_COMMUNITY): Payer: Self-pay

## 2024-01-02 DIAGNOSIS — R221 Localized swelling, mass and lump, neck: Secondary | ICD-10-CM

## 2024-01-03 ENCOUNTER — Other Ambulatory Visit: Payer: Self-pay

## 2024-01-03 ENCOUNTER — Ambulatory Visit (HOSPITAL_BASED_OUTPATIENT_CLINIC_OR_DEPARTMENT_OTHER)
Admission: RE | Admit: 2024-01-03 | Discharge: 2024-01-03 | Disposition: A | Discharge status: Home / Self Care | Source: Ambulatory Visit

## 2024-01-03 ENCOUNTER — Emergency Department (HOSPITAL_BASED_OUTPATIENT_CLINIC_OR_DEPARTMENT_OTHER)

## 2024-01-03 ENCOUNTER — Encounter (HOSPITAL_BASED_OUTPATIENT_CLINIC_OR_DEPARTMENT_OTHER): Payer: Self-pay | Admitting: Emergency Medicine

## 2024-01-03 ENCOUNTER — Emergency Department (HOSPITAL_BASED_OUTPATIENT_CLINIC_OR_DEPARTMENT_OTHER)
Admission: EM | Admit: 2024-01-03 | Discharge: 2024-01-03 | Disposition: A | Discharge status: Home / Self Care | Attending: Emergency Medicine | Admitting: Emergency Medicine

## 2024-01-03 DIAGNOSIS — I10 Essential (primary) hypertension: Secondary | ICD-10-CM | POA: Insufficient documentation

## 2024-01-03 DIAGNOSIS — R519 Headache, unspecified: Secondary | ICD-10-CM | POA: Insufficient documentation

## 2024-01-03 DIAGNOSIS — R221 Localized swelling, mass and lump, neck: Secondary | ICD-10-CM

## 2024-01-03 DIAGNOSIS — R9389 Abnormal findings on diagnostic imaging of other specified body structures: Secondary | ICD-10-CM | POA: Diagnosis not present

## 2024-01-03 DIAGNOSIS — Z7982 Long term (current) use of aspirin: Secondary | ICD-10-CM | POA: Diagnosis not present

## 2024-01-03 DIAGNOSIS — Z79899 Other long term (current) drug therapy: Secondary | ICD-10-CM | POA: Insufficient documentation

## 2024-01-03 HISTORY — DX: Tachycardia, unspecified: R00.0

## 2024-01-03 LAB — COMPREHENSIVE METABOLIC PANEL WITH GFR
ALT: 35 U/L (ref 0–44)
AST: 21 U/L (ref 15–41)
Albumin: 4.3 g/dL (ref 3.5–5.0)
Alkaline Phosphatase: 90 U/L (ref 38–126)
Anion gap: 9 (ref 5–15)
BUN: 13 mg/dL (ref 8–23)
CO2: 27 mmol/L (ref 22–32)
Calcium: 9.5 mg/dL (ref 8.9–10.3)
Chloride: 104 mmol/L (ref 98–111)
Creatinine, Ser: 0.95 mg/dL (ref 0.61–1.24)
GFR, Estimated: >60 mL/min (ref >60)
Glucose, Bld: 92 mg/dL (ref 70–99)
Potassium: 4.8 mmol/L (ref 3.5–5.1)
Sodium: 140 mmol/L (ref 135–145)
Total Bilirubin: 0.3 mg/dL (ref 0.0–1.2)
Total Protein: 7.3 g/dL (ref 6.5–8.1)

## 2024-01-03 LAB — CBC WITH DIFFERENTIAL/PLATELET
Abs Immature Granulocytes: 0.02 10*3/uL (ref 0.00–0.07)
Basophils Absolute: 0 10*3/uL (ref 0.0–0.1)
Basophils Relative: 1 %
Eosinophils Absolute: 0.2 10*3/uL (ref 0.0–0.5)
Eosinophils Relative: 3 %
HCT: 44.9 % (ref 39.0–52.0)
Hemoglobin: 14.8 g/dL (ref 13.0–17.0)
Immature Granulocytes: 0 %
Lymphocytes Relative: 23 %
Lymphs Abs: 1.8 10*3/uL (ref 0.7–4.0)
MCH: 28.8 pg (ref 26.0–34.0)
MCHC: 33 g/dL (ref 30.0–36.0)
MCV: 87.4 fL (ref 80.0–100.0)
Monocytes Absolute: 0.7 10*3/uL (ref 0.1–1.0)
Monocytes Relative: 9 %
Neutro Abs: 4.9 10*3/uL (ref 1.7–7.7)
Neutrophils Relative %: 64 %
Platelets: 256 10*3/uL (ref 150–400)
RBC: 5.14 MIL/uL (ref 4.22–5.81)
RDW: 12.5 % (ref 11.5–15.5)
WBC: 7.6 10*3/uL (ref 4.0–10.5)
nRBC: 0 % (ref 0.0–0.2)

## 2024-01-03 LAB — RESP PANEL BY RT-PCR (RSV, FLU A&B, COVID)  RVPGX2
Influenza A by PCR: NEGATIVE
Influenza B by PCR: NEGATIVE
Resp Syncytial Virus by PCR: NEGATIVE
SARS Coronavirus 2 by RT PCR: NEGATIVE

## 2024-01-03 MED ORDER — SODIUM CHLORIDE 0.9 % IV BOLUS
1000.0000 mL | Freq: Once | INTRAVENOUS | Status: AC
  Administered 2024-01-03: 1000 mL via INTRAVENOUS

## 2024-01-03 MED ORDER — KETOROLAC TROMETHAMINE 30 MG/ML IJ SOLN
30.0000 mg | Freq: Once | INTRAMUSCULAR | Status: AC
  Administered 2024-01-03: 30 mg via INTRAVENOUS
  Filled 2024-01-03: qty 1

## 2024-01-03 MED ORDER — IOHEXOL 300 MG/ML  SOLN
100.0000 mL | Freq: Once | INTRAMUSCULAR | Status: AC | PRN
  Administered 2024-01-03: 100 mL via INTRAVENOUS

## 2024-01-03 NOTE — ED Provider Notes (Signed)
 [place] [facility] Provider Note   CSN: [id number] Arrival date & time: 01/03/24  1547     History  Chief Complaint  Patient presents with   Headache    Douglas Hester is a 63 y.o. male.  Pt is a 63 yo male with pmhx significant for gerd, htn, bipolar d/o and anxiety.  Pt has been having headaches for about a week.  He has also had an enlarged lymph node to his left neck.  Pcp ordered an us  which showed a left submandibular mass suspicious for a necrotic lymph node.  Radiology recommended getting a contrast enhanced neck CT for further eval.  Pt said he's felt like he's had a sinus infection for a month.  He's been on amox/zithromax and was just put on clindamycin yesterday.  Pt said his father died from lymphoma and he's concerned this is what he has.  He's felt like he's had fevers and chills.        Home Medications Prior to Admission medications   Medication Sig Start Date End Date Taking? Authorizing Provider  Aspirin -Salicylamide-Caffeine (BC HEADACHE POWDER PO) Take 1 packet by mouth every evening.    [provider]  buPROPion  (WELLBUTRIN  XL) 150 MG 24 hr tablet Take 1 tablet (150 mg total) by mouth daily. Bridge to patient appt 12/16 10/15/23   Arfeen, Bronson Canny, MD  ibuprofen (ADVIL,MOTRIN) 200 MG tablet Take 400 mg by mouth daily as needed for moderate pain.    [provider]  lamoTRIgine  (LAMICTAL ) 150 MG tablet Take 1 tablet (150 mg total) by mouth daily. 10/15/23   Arfeen, Bronson Canny, MD  loperamide  (IMODIUM  A-D) 2 MG tablet Take 1 tablet (2 mg total) by mouth 4 (four) times daily as needed for diarrhea or loose stools. 10/18/22   Eloise Hake Scales, PA-C  Melatonin 5 MG TABS Take 5 mg by mouth at bedtime as needed (sleep).    [provider]  metoprolol  succinate (TOPROL -XL) 25 MG 24 hr tablet 1 tablet Orally Once a day for 30 days 09/18/22   [provider]  mirtazapine  (REMERON ) 15 MG tablet Take 1  tablet (15 mg total) by mouth as needed (for sleep). 10/15/23 10/14/24  Arfeen, Bronson Canny, MD  olmesartan (BENICAR) 5 MG tablet Take 5 mg by mouth daily.    [provider]      Allergies    Patient has no known allergies.    Review of Systems   Review of Systems  HENT:  Positive for sinus pain.        Left neck pain  All other systems reviewed and are negative.   Physical Exam Updated Vital Signs BP (!) 151/95   Pulse 99   Temp 98 F (36.7 C)   Resp 18   Ht 5\' 9"  (1.753 m)   Wt 98.4 kg   SpO2 99%   BMI 32.05 kg/m  Physical Exam Vitals and nursing note reviewed.  Constitutional:      Appearance: He is well-developed.  HENT:     Head: Normocephalic and atraumatic.     Mouth/Throat:     Mouth: Mucous membranes are moist.     Pharynx: Oropharynx is clear.  Eyes:     Extraocular Movements: Extraocular movements intact.     Pupils: Pupils are equal, round, and reactive to light.  Cardiovascular:     Rate and Rhythm: Normal rate and regular rhythm.     Heart sounds: Normal heart sounds.  Pulmonary:     Effort: Pulmonary effort is normal.     Breath sounds: Normal breath sounds.  Abdominal:     General: Bowel sounds are normal.     Palpations: Abdomen is soft.  Musculoskeletal:        General: Normal range of motion.     Cervical back: Normal range of motion.  Lymphadenopathy:     Cervical: Cervical adenopathy present.     Left cervical: Superficial cervical adenopathy present.  Skin:    General: Skin is warm.     Capillary Refill: Capillary refill takes less than 2 seconds.  Neurological:     Mental Status: He is alert and oriented to person, place, and time.  Psychiatric:        Mood and Affect: Mood normal.        Speech: Speech normal.        Behavior: Behavior normal.     ED Results / Procedures / Treatments   Labs (all labs ordered are listed, but only abnormal results are displayed) Labs Reviewed  RESP PANEL BY RT-PCR (RSV, FLU A&B, COVID)   [id number]  COMPREHENSIVE METABOLIC PANEL WITH GFR  CBC WITH DIFFERENTIAL/PLATELET    EKG None  Radiology CT Soft Tissue Neck W Contrast Result Date: 01/03/2024 CLINICAL DATA:  Neck mass, nonpulsatile EXAM: CT NECK WITH CONTRAST TECHNIQUE: Multidetector CT imaging of the neck was performed using the standard protocol following the bolus administration of intravenous contrast. RADIATION DOSE REDUCTION: This exam was performed according to the departmental dose-optimization program which includes automated exposure control, adjustment of the mA and/or kV according to patient size and/or use of iterative reconstruction technique. CONTRAST:  OMNIPAQUE  IOHEXOL  300 MG/ML  SOLN COMPARISON:  None Available. FINDINGS: Pharynx and larynx: No definite/obvious mass. No inflammatory change. Salivary glands: No inflammation, mass, or stone. Thyroid: Normal. Lymph nodes: Necrotic 2.6 cm left level II mass is suspicious a conglomerate nodal metastasis. No other pathologically enlarged lymph nodes identified. Head CT could provide more sensitive nodal staging if clinically warranted. Vascular: Patent. Limited intracranial: Negative. Visualized orbits: Negative. Mastoids and visualized paranasal sinuses: Clear. Skeleton: No acute or aggressive process. Upper chest: Clear lung apices. IMPRESSION: Necrotic 2.6 cm left level II mass is suspicious a conglomerate nodal metastasis. No definite/obvious primary malignancy identified; however, recommend ENT consultation for direct mucosal evaluation. Electronically Signed   By: Stevenson Elbe M.D.   On: 01/03/2024 20:49   CT Chest W Contrast Result Date: 01/03/2024 EXAM: CT CHEST WITH CONTRAST 01/03/2024 06:58:12 PM TECHNIQUE: CT of the chest was performed with the administration of intravenous iohexol  (OMNIPAQUE ) 300 MG/ML solution. Multiplanar reformatted images are provided for review. Automated exposure control, iterative reconstruction, and/or weight based  adjustment of the mA/kV was utilized to reduce the radiation dose to as low as reasonably achievable. COMPARISON: CTA chest dated 07/09/2022. CLINICAL HISTORY: Dyspnea, chronic, unclear etiology; neck mass; family hx lymphoma. 63 y/o male. Pt has been having headaches and enlarged lymph nodes for 3 weeks. Pt had US  today which showed a mass on the left side of his neck. FINDINGS: MEDIASTINUM: The heart is normal in size with prominent epicardial fat. The central airways are clear. LYMPH NODES: Supraclavicular regions are within normal limits. Additional left neck findings described on dedicated CT neck report. LUNGS AND PLEURA: No focal consolidation or pulmonary edema. No pleural effusion or pneumothorax. SOFT TISSUES/BONES: No acute abnormality of the bones or soft tissues. UPPER ABDOMEN: Status post cholecystectomy. Limited images of the upper  abdomen demonstrate no acute abnormality. IMPRESSION: 1. Negative CT chest. 2. Additional left neck findings described on dedicated CT neck report. Electronically signed by: Zadie Herter MD 01/03/2024 07:06 PM EDT RP Workstation: [id number]   CT Head Wo Contrast Result Date: 01/03/2024 CLINICAL DATA:  Headache. EXAM: CT HEAD WITHOUT CONTRAST TECHNIQUE: Contiguous axial images were obtained from the base of the skull through the vertex without intravenous contrast. RADIATION DOSE REDUCTION: This exam was performed according to the departmental dose-optimization program which includes automated exposure control, adjustment of the mA and/or kV according to patient size and/or use of iterative reconstruction technique. COMPARISON:  None Available. FINDINGS: Brain: No intracranial hemorrhage, mass effect, or midline shift. Brain volume is normal for age. No hydrocephalus. The basilar cisterns are patent. Minimal periventricular chronic small vessel ischemia. No evidence of territorial infarct or acute ischemia. No extra-axial or intracranial fluid collection. Vascular: No  hyperdense vessel or unexpected calcification. Skull: No fracture or focal lesion. Sinuses/Orbits: Paranasal sinuses and mastoid air cells are clear. The visualized orbits are unremarkable. Other: None. IMPRESSION: 1. No acute intracranial abnormality.  No explanation for headache. 2. Minimal chronic small vessel ischemia. Electronically Signed   By: Chadwick Colonel M.D.   On: 01/03/2024 19:02   US  SOFT TISSUE HEAD & NECK (NON-THYROID) Result Date: 01/03/2024 CLINICAL DATA:  LEFT neck mass.  Family history of lymphoma. EXAM: ULTRASOUND OF HEAD/NECK SOFT TISSUES TECHNIQUE: Ultrasound examination of the head and neck soft tissues was performed in the area of clinical concern. COMPARISON:  None available FINDINGS: Targeted sonographic evaluation of the area of concern in the LEFT submandibular region demonstrates an oval heterogeneous, predominantly hypoechoic mass measuring 3.4 x 1.9 x 3.1 cm. IMPRESSION: 3.4 x 1.9 x 3.1 cm LEFT submandibular mass is suspicious for necrotic lymph node. Further evaluation with contrast enhanced neck CT recommended. Electronically Signed   By: Elester Grim M.D.   On: 01/03/2024 10:40    Procedures Procedures    Medications Ordered in ED Medications  sodium chloride  0.9 % bolus 1,000 mL (0 mLs Intravenous Stopped 01/03/24 1826)  ketorolac  (TORADOL ) 30 MG/ML injection 30 mg (30 mg Intravenous Given 01/03/24 1721)  iohexol  (OMNIPAQUE ) 300 MG/ML solution 100 mL (100 mLs Intravenous Contrast Given 01/03/24 1829)    ED Course/ Medical Decision Making/ A&P                                 Medical Decision Making Amount and/or Complexity of Data Reviewed Labs: ordered. Radiology: ordered.  Risk Prescription drug management.   This patient presents to the ED for concern of neck mass, sinus issues, this involves an extensive number of treatment options, and is a complaint that carries with it a high risk of complications and morbidity.  The differential diagnosis includes  malignancy, covid/flu/rsv, pna   Co morbidities that complicate the patient evaluation  gerd, htn, bipolar d/o and anxiety   Additional history obtained:  Additional history obtained from epic chart review External records from outside source obtained and reviewed including friend   Lab Tests:  I Ordered, and personally interpreted labs.  The pertinent results include:  cbc nl, cmp nl, covid/flu/rsv neg   Imaging Studies ordered:  I ordered imaging studies including ct head, ct chest, ct neck  I independently visualized and interpreted imaging which showed  CT head:  No acute intracranial abnormality.  No explanation for headache.  2. Minimal chronic small vessel ischemia.  CT chest:  Negative CT chest.  2. Additional left neck findings described on dedicated CT neck report.  CT neck: Necrotic 2.6 cm left level II mass is suspicious a conglomerate  nodal metastasis. No definite/obvious primary malignancy identified;  however, recommend ENT consultation for direct mucosal evaluation.   I agree with the radiologist interpretation   Cardiac Monitoring:  The patient was maintained on a cardiac monitor.  I personally viewed and interpreted the cardiac monitored which showed an underlying rhythm of: nsr   Medicines ordered and prescription drug management:  I ordered medication including ivfs/toradol   for sx  Reevaluation of the patient after these medicines showed that the patient improved I have reviewed the patients home medicines and have made adjustments as needed   Test Considered:  ct   Problem List / ED Course:  Neck mass:  met vs primary.  Pt will need a bx to determine further treatment.  Pt referred to ENT.  He is to return if worse.  F/u with ENT.   Reevaluation:  After the interventions noted above, I reevaluated the patient and found that they have :improved   Social Determinants of Health:  Lives at home   Dispostion:  After  consideration of the diagnostic results and the patients response to treatment, I feel that the patent would benefit from discharge with outpatient f/u.          Final Clinical Impression(s) / ED Diagnoses Final diagnoses:  Neck mass    Rx / DC Orders ED Discharge Orders          Ordered    Ambulatory referral to ENT        01/03/24 2116              Sueellen Emery, MD 01/03/24 2119

## 2024-01-03 NOTE — ED Triage Notes (Signed)
 Pt has been having headaches and enlarged lymph nodes for 3 weeks.  Pt had US  today which showed a mass on the left side of his neck.  Pt sent here by PCP for further testing/evaluation.

## 2024-01-04 ENCOUNTER — Encounter (INDEPENDENT_AMBULATORY_CARE_PROVIDER_SITE_OTHER): Payer: Self-pay | Admitting: Otolaryngology

## 2024-01-04 ENCOUNTER — Ambulatory Visit (INDEPENDENT_AMBULATORY_CARE_PROVIDER_SITE_OTHER): Admitting: Otolaryngology

## 2024-01-04 VITALS — BP 121/81 | HR 100 | Resp 96 | Ht 69.0 in | Wt 217.0 lb

## 2024-01-04 DIAGNOSIS — R221 Localized swelling, mass and lump, neck: Secondary | ICD-10-CM | POA: Diagnosis not present

## 2024-01-04 NOTE — Patient Instructions (Signed)
 I have ordered an imaging study for you to complete prior to your next visit. Please call [facility] Scheduling at [phone] to schedule your imaging if you have not received a call within 24 hours. If you are unable to complete your imaging study prior to your next scheduled visit please call our office to let us know.

## 2024-01-04 NOTE — Progress Notes (Signed)
 Dear Dr. Clotilde Danker, Here is my assessment for our mutual patient, Douglas Hester. Thank you for allowing me the opportunity to care for your patient. Please do not hesitate to contact me should you have any other questions. Sincerely, Dr. Milon Aloe  Otolaryngology Clinic Note Referring provider: Dr. Clotilde Danker HPI:  Douglas Hester is a 63 y.o. male kindly referred by Dr. Clotilde Danker for evaluation of left neck mass.  Initial visit (12/2023): Patient reports: he had a URI on Mother's day weekend and got antibiotics which helped some. But about 2 weeks later, he noted a growth on his left neck - some tenderness, no fevers. Did not resolve so went to ED where CT was done. Associated symptoms include some headache, Bilateral ear discomfort (left some worse than right), and subjective fever, malaise and some night sweats (chest mostly, but not soaking wet). No sick contacts. Patient otherwise denies: - dysphagia, odynophagia, unintentional weight loss - changes in voice, shortness of breath, hemoptysis  - Denies tobacco use or alcohol use (used to, 4-5 beers on weekend but quit for past 4 years)  H&N Surgery: Tonsillectomy (as a child) Personal or FHx of bleeding dz or anesthesia difficulty: no  GLP-1: no AP/AC: no  Tobacco: no  PMHx: HTN, Insomnia, Anxiety  Independent Review of Additional Tests or Records:  CT Neck 01/03/2024 independently interpreted: large left jugulogastric node, internal cystic component; no significant stranding; left carotid medialized and this does appear to cause some asymmetry left OP and lingual tonsil prominence; noted slight left GT sulcus and hypopharynx asymmetry; no other concerning nodes noted. CT Chest 01/03/2024: no large pulmonary nodules noted Labs: CBC and CMP 01/03/2024: WBC 7.6, Hgb 14.8, Plt 256; BUN/Cr wnl PMH/Meds/All/SocHx/FamHx/ROS:   Past Medical History:  Diagnosis Date   Anxiety    Chronic GERD    Hypertension    Tachycardia      Past Surgical  History:  Procedure Laterality Date   CHOLECYSTECTOMY N/A 10/11/2018   Procedure: LAPAROSCOPIC CHOLECYSTECTOMY WITH INTRAOPERATIVE CHOLANGIOGRAM;  Surgeon: Caralyn Chandler, MD;  Location: [facility];  Service: General;  Laterality: N/A;   COLONOSCOPY  ~ 2014   done by GI in [place]. Polyps were removed. Patient not aware what type of polyps these were   ESOPHAGOGASTRODUODENOSCOPY  early 1990s   MD found ulcer   INGUINAL HERNIA REPAIR Left ~ 2010   with Mesh.    VASECTOMY      Family History  Problem Relation Age of Onset   Lung cancer Mother    Lymphoma Father    Valvular heart disease Sister      Social Connections: Not on file      Current Outpatient Medications:    albuterol (VENTOLIN HFA) 108 (90 Base) MCG/ACT inhaler, Inhale 2 puffs into the lungs every 4 (four) hours as needed., Disp: , Rfl:    amoxicillin-clavulanate (AUGMENTIN) 875-125 MG tablet, Take 1 tablet by mouth 2 (two) times daily for 7 days., Disp: 14 tablet, Rfl: 0   Aspirin -Salicylamide-Caffeine (BC HEADACHE POWDER PO), Take 1 packet by mouth every evening., Disp: , Rfl:    Azelastine HCl 137 MCG/SPRAY SOLN, Place 1 spray into the nose., Disp: , Rfl:    buPROPion  (WELLBUTRIN  XL) 150 MG 24 hr tablet, Take 1 tablet (150 mg total) by mouth daily. Bridge to patient appt 12/16, Disp: 90 tablet, Rfl: 0   fluticasone (FLONASE) 50 MCG/ACT nasal spray, Place 1 spray into both nostrils 2 (two) times daily., Disp: , Rfl:    ibuprofen (ADVIL,MOTRIN)  200 MG tablet, Take 400 mg by mouth daily as needed for moderate pain., Disp: , Rfl:    ipratropium (ATROVENT) 0.06 % nasal spray, INSTILL 2 SPRAYS (INTRANASAL) 3 TIMES PER DAY FOR 10 DAYS ADMINISTER INTO EACH NOSTRIL, Disp: , Rfl:    lamoTRIgine  (LAMICTAL ) 150 MG tablet, Take 1 tablet (150 mg total) by mouth daily., Disp: 30 tablet, Rfl: 2   lisinopril (ZESTRIL) 10 MG tablet, Take 10 mg by mouth., Disp: , Rfl:    loperamide  (IMODIUM  A-D) 2 MG tablet, Take 1 tablet (2 mg total) by  mouth 4 (four) times daily as needed for diarrhea or loose stools., Disp: 30 tablet, Rfl: 0   Melatonin 5 MG TABS, Take 5 mg by mouth at bedtime as needed (sleep)., Disp: , Rfl:    metoprolol  succinate (TOPROL -XL) 25 MG 24 hr tablet, 1 tablet Orally Once a day for 30 days, Disp: , Rfl:    mirtazapine  (REMERON ) 15 MG tablet, Take 1 tablet (15 mg total) by mouth as needed (for sleep)., Disp: 90 tablet, Rfl: 0   olmesartan (BENICAR) 5 MG tablet, Take 5 mg by mouth daily., Disp: , Rfl:    Physical Exam:   BP 121/81 (BP Location: Left Arm, Patient Position: Sitting, Cuff Size: Large)   Pulse 100   Resp (!) 96   Ht 5' 9 (1.753 m)   Wt 217 lb (98.4 kg)   BMI 32.05 kg/m   Salient findings:  CN II-XII intact Bilateral EAC clear and TM intact with well pneumatized middle ear spaces Anterior rhinoscopy: Septum relatively midline; bilateral inferior turbinates without significant hypertrophy No lesions of oral cavity/oropharynx; dentition fair; strong gag; no noted palpable tongue base fullness or significant fullness over left glossotonsillar sulcus b/l No obviously palpable neck masses/lymphadenopathy/thyromegaly except left level 2 relatively mobile neck mass, not tender No respiratory distress or stridor; TFL was indicated to better evaluate the proximal airway, given the patient's history and exam findings, and is detailed below.  Seprately Identifiable Procedures:  Prior to initiating any procedures, risks/benefits/alternatives were explained to the patient and verbal consent obtained. Procedure Note Pre-procedure diagnosis:  Neck mass, look for primary lesion, concern for carcinoma Post-procedure diagnosis: Same Procedure: Transnasal Fiberoptic Laryngoscopy, CPT 31575 - Mod 25 Indication: see above Complications: None apparent EBL: 0 mL  The procedure was undertaken to further evaluate the patient's complaint, with mirror exam inadequate for appropriate examination due to gag reflex and  poor patient tolerance  Procedure:  Patient was identified as correct patient. Verbal consent was obtained. The nose was sprayed with oxymetazoline and 4% lidocaine . The The flexible laryngoscope was passed through the nose to view the nasal cavity, pharynx (oropharynx, hypopharynx) and larynx.  The larynx was examined at rest and during multiple phonatory tasks. Documentation was obtained and reviewed with patient. The scope was removed. The patient tolerated the procedure well.  Findings: The nasal cavity and nasopharynx did not reveal any masses or lesions, mucosa appeared to be without obvious lesions. The tongue base, pharyngeal walls, piriform sinuses, vallecula, epiglottis and postcricoid region are normal in appearance - unable to note any obvious worrisome masses, but lingual tonsils b/l mild prominence especially on left. The visualized portion of the subglottis and proximal trachea is widely patent. The vocal folds are mobile bilaterally. There are no lesions on the free edge of the vocal folds nor elsewhere in the larynx worrisome for malignancy.       Electronically signed by: Evelina Hippo, MD 01/13/2024 3:09 PM  Impression & Plans:  Kaisyn Millea is a 63 y.o. male with:  1. Mass of left side of neck    We discussed that given his CT and exam findings, DDX most likely includes malignancy. No obvious primary on TFL. He needs further workup - will start with core bx left neck node. F/u 3 weeks, sooner if necessary   Thank you for allowing me the opportunity to care for your patient. Please do not hesitate to contact me should you have any other questions.  Sincerely, Milon Aloe, MD Otolaryngologist (ENT), [facility] Phone: [phone] Fax: [phone]  01/13/2024, 3:09 PM   MDM:  Level 4 - 99204 Complexity/Problems addressed: mod - new problem, unknown diagnosis and prognosis needing further workup Data complexity: mod - independent review of labs;  independent interpretation of CT - Morbidity: unknown  - Prescription Drug prescribed or managed: no

## 2024-01-07 NOTE — Progress Notes (Signed)
 Rosetta Cons, MD  Joelle Musca; P Ir Procedure Requests PROCEDURE / BIOPSY REVIEW Date: 01/04/24  Requested Biopsy site: left neck Reason for request: lymphadenopathy Imaging review: Best seen on CT neck  Decision: Approved Imaging modality to perform: Ultrasound Schedule with: No sedation / Local anesthetic Schedule for: Any VIR  Additional comments: @Schedulers .   Please contact me with questions, concerns, or if issue pertaining to this request arise.  Rosetta Cons, MD [facility]       Previous Messages    ----- Message ----- From: Garhett Bernhard Sent: 01/04/2024   4:02 PM EDT To: Laketha Leopard; Ir Procedure Requests Subject: US  core biopsy ( Lymph nodes)                  Procedure : US  Core biopsy ( lymph nodes)  - STAT  Reason : left neck lymphadenopathy; rule out carcinoma; HPV squamous cell carcinoma? Dx: Mass of left side of neck [R22.1 (ICD-10-CM)]  History : Ct Chest w/ , Ct soft tissue neck w. Contrast , ct head w/o , us  soft tissue head and neck  Provider : Evelina Hippo, MD  Contact :  [phone]

## 2024-01-10 ENCOUNTER — Other Ambulatory Visit (HOSPITAL_COMMUNITY): Payer: Self-pay | Admitting: Psychiatry

## 2024-01-10 DIAGNOSIS — F3130 Bipolar disorder, current episode depressed, mild or moderate severity, unspecified: Secondary | ICD-10-CM

## 2024-01-10 NOTE — Progress Notes (Signed)
 Patient for US  guided Core LT LN Biopsy on Mon 01/14/24, I called and spoke with the patient on the phone and gave pre-procedure instructions. Pt was made aware to be here at 12:30p and check in at the [facility] registration desk. Pt stated understanding.  Called 01/07/2024

## 2024-01-11 MED ORDER — AMOXICILLIN-POT CLAVULANATE 875-125 MG PO TABS: 1.0000 | ORAL_TABLET | Freq: Two times a day (BID) | ORAL | 0 refills | Status: AC

## 2024-01-14 ENCOUNTER — Ambulatory Visit
Admission: RE | Admit: 2024-01-14 | Discharge: 2024-01-14 | Disposition: A | Discharge status: Home / Self Care | Source: Ambulatory Visit | Attending: Otolaryngology | Admitting: Otolaryngology

## 2024-01-14 ENCOUNTER — Encounter (HOSPITAL_COMMUNITY): Payer: Self-pay | Admitting: Psychiatry

## 2024-01-14 ENCOUNTER — Telehealth (HOSPITAL_BASED_OUTPATIENT_CLINIC_OR_DEPARTMENT_OTHER): Admitting: Psychiatry

## 2024-01-14 DIAGNOSIS — R221 Localized swelling, mass and lump, neck: Secondary | ICD-10-CM | POA: Diagnosis present

## 2024-01-14 DIAGNOSIS — F3130 Bipolar disorder, current episode depressed, mild or moderate severity, unspecified: Secondary | ICD-10-CM | POA: Diagnosis not present

## 2024-01-14 DIAGNOSIS — G4700 Insomnia, unspecified: Secondary | ICD-10-CM | POA: Diagnosis not present

## 2024-01-14 MED ORDER — LAMOTRIGINE 150 MG PO TABS: 150.0000 mg | ORAL_TABLET | Freq: Every day | ORAL | 2 refills | Status: DC

## 2024-01-14 MED ORDER — BUPROPION HCL ER (XL) 150 MG PO TB24
150.0000 mg | ORAL_TABLET | Freq: Every day | ORAL | 0 refills | Status: DC
Start: 2024-01-14 — End: 2024-04-21

## 2024-01-14 MED ORDER — LIDOCAINE HCL (PF) 1 % IJ SOLN
5.0000 mL | Freq: Once | INTRAMUSCULAR | Status: AC
  Administered 2024-01-14: 5 mL via INTRADERMAL
  Filled 2024-01-14: qty 5

## 2024-01-14 MED ORDER — MIRTAZAPINE 15 MG PO TABS
15.0000 mg | ORAL_TABLET | ORAL | 0 refills | Status: DC | PRN
Start: 2024-01-14 — End: 2024-04-21

## 2024-01-14 NOTE — Procedures (Signed)
 Interventional Radiology Procedure Note  Procedure: U/S Guided Biopsy and aspirationof left neck mass  Complications: None  Estimated Blood Loss: < 10 mL  Findings: Right neck mass possibly infected or necrotic.  CNB and aspiraton 3mL.  One core samples obtained and sent to Pathology.  Fluid sent for cultures.  Rosetta Cons, MD

## 2024-01-14 NOTE — Progress Notes (Signed)
 [facility] MD Virtual Progress Note   Patient Location: Home Provider Location: Home office  I connect with patient by video and verified that I am speaking with correct person by using two identifiers. I discussed the limitations of evaluation and management by telemedicine and the availability of in person appointments. I also discussed with the patient that there may be a patient responsible charge related to this service. The patient expressed understanding and agreed to proceed.  Douglas Hester [id number] 63 y.o.  01/14/2024 8:54 AM  History of Present Illness:  Patient is evaluated by video session.  He is taking Lamictal , mirtazapine  and Wellbutrin .  He reported things are okay however lately some concern and anxiousness because of his general health.  He had a swelling on left side of the neck and saw ENT and his primary care and found to have swelling of the lymph node.  He is going to have a biopsy today and then he is going to have a follow-up with the doctor end of this month.  He reported headaches and some time tenderness on the left side of the neck.  He was also seen in the emergency room for headaches.  He is only taking Tylenol  and that seems to be working on and off.  Denies any mania, psychosis, irritability or anger.  His job is slow but is still manageable.  Patient works for [employer].  He has a good social network from [place] and he is very actively involved.  His appetite is okay.  His weight is unchanged from the past.  Denies any panic attack, crying spells or any feeling of hopelessness or worthlessness.  He has no tremor or shakes.  Would like to keep the current medication.  Past Psychiatric History: Saw Dr. Toi Foster in past and diagnosed with bipolar and depression. Given Lamictal  and Wellbutrin .  Rozerem  did not work. PCP tried Prozac, Paxil (sexual side effects) and trazodone  (did not work). Abilify  never picked up. No h/o suicidal attempt,  psychosis and inpatient treatment.     Outpatient Encounter Medications as of 01/14/2024  Medication Sig   albuterol (VENTOLIN HFA) 108 (90 Base) MCG/ACT inhaler Inhale 2 puffs into the lungs every 4 (four) hours as needed.   amoxicillin-clavulanate (AUGMENTIN) 875-125 MG tablet Take 1 tablet by mouth 2 (two) times daily for 7 days.   Aspirin -Salicylamide-Caffeine (BC HEADACHE POWDER PO) Take 1 packet by mouth every evening.   Azelastine HCl 137 MCG/SPRAY SOLN Place 1 spray into the nose.   buPROPion  (WELLBUTRIN  XL) 150 MG 24 hr tablet Take 1 tablet (150 mg total) by mouth daily. Bridge to patient appt 12/16   fluticasone (FLONASE) 50 MCG/ACT nasal spray Place 1 spray into both nostrils 2 (two) times daily.   ibuprofen (ADVIL,MOTRIN) 200 MG tablet Take 400 mg by mouth daily as needed for moderate pain.   ipratropium (ATROVENT) 0.06 % nasal spray INSTILL 2 SPRAYS (INTRANASAL) 3 TIMES PER DAY FOR 10 DAYS ADMINISTER INTO EACH NOSTRIL   lamoTRIgine  (LAMICTAL ) 150 MG tablet Take 1 tablet (150 mg total) by mouth daily.   lisinopril (ZESTRIL) 10 MG tablet Take 10 mg by mouth.   loperamide  (IMODIUM  A-D) 2 MG tablet Take 1 tablet (2 mg total) by mouth 4 (four) times daily as needed for diarrhea or loose stools.   Melatonin 5 MG TABS Take 5 mg by mouth at bedtime as needed (sleep).   metoprolol  succinate (TOPROL -XL) 25 MG 24 hr tablet 1 tablet Orally Once a day  for 30 days   mirtazapine  (REMERON ) 15 MG tablet Take 1 tablet (15 mg total) by mouth as needed (for sleep).   olmesartan (BENICAR) 5 MG tablet Take 5 mg by mouth daily.   No facility-administered encounter medications on file as of 01/14/2024.    Recent Results (from the past 2160 hours)  Comprehensive metabolic panel     Status: None   Collection Time: 01/03/24  4:23 PM  Result Value Ref Range   Sodium 140 135 - 145 mmol/L   Potassium 4.8 3.5 - 5.1 mmol/L   Chloride 104 98 - 111 mmol/L   CO2 27 22 - 32 mmol/L   Glucose, Bld 92 70 - 99  mg/dL    Comment: Glucose reference range applies only to samples taken after fasting for at least 8 hours.   BUN 13 8 - 23 mg/dL   Creatinine, Ser 9.60 0.61 - 1.24 mg/dL   Calcium 9.5 8.9 - 45.4 mg/dL   Total Protein 7.3 6.5 - 8.1 g/dL   Albumin 4.3 3.5 - 5.0 g/dL   AST 21 15 - 41 U/L   ALT 35 0 - 44 U/L   Alkaline Phosphatase 90 38 - 126 U/L   Total Bilirubin 0.3 0.0 - 1.2 mg/dL   GFR, Estimated >09 >81 mL/min    Comment: (NOTE) Calculated using the CKD-EPI Creatinine Equation (2021)    Anion gap 9 5 - 15    Comment: Performed at [facility], [address] [place], [place] [address]  CBC with Differential     Status: None   Collection Time: 01/03/24  4:23 PM  Result Value Ref Range   WBC 7.6 4.0 - 10.5 K/uL   RBC 5.14 4.22 - 5.81 MIL/uL   Hemoglobin 14.8 13.0 - 17.0 g/dL   HCT 82.9 56.2 - 13.0 %   MCV 87.4 80.0 - 100.0 fL   MCH 28.8 26.0 - 34.0 pg   MCHC 33.0 30.0 - 36.0 g/dL   RDW 86.5 78.4 - 69.6 %   Platelets 256 150 - 400 K/uL   nRBC 0.0 0.0 - 0.2 %   Neutrophils Relative % 64 %   Neutro Abs 4.9 1.7 - 7.7 K/uL   Lymphocytes Relative 23 %   Lymphs Abs 1.8 0.7 - 4.0 K/uL   Monocytes Relative 9 %   Monocytes Absolute 0.7 0.1 - 1.0 K/uL   Eosinophils Relative 3 %   Eosinophils Absolute 0.2 0.0 - 0.5 K/uL   Basophils Relative 1 %   Basophils Absolute 0.0 0.0 - 0.1 K/uL   Immature Granulocytes 0 %   Abs Immature Granulocytes 0.02 0.00 - 0.07 K/uL    Comment: Performed at [facility], [address] [place], [place] [address]  Resp panel by RT-PCR (RSV, Flu A&B, Covid) Anterior Nasal Swab     Status: None   Collection Time: 01/03/24  4:23 PM   Specimen: Anterior Nasal Swab  Result Value Ref Range   SARS Coronavirus 2 by RT PCR NEGATIVE NEGATIVE    Comment: (NOTE) SARS-CoV-2 target nucleic acids are NOT DETECTED.  The SARS-CoV-2 RNA is generally detectable in upper respiratory specimens during the acute phase of infection. The  lowest concentration of SARS-CoV-2 viral copies this assay can detect is 138 copies/mL. A negative result does not preclude SARS-Cov-2 infection and should not be used as the sole basis for treatment or other patient management decisions. A negative result may occur with  improper specimen collection/handling,  submission of specimen other than nasopharyngeal swab, presence of viral mutation(s) within the areas targeted by this assay, and inadequate number of viral copies(<138 copies/mL). A negative result must be combined with clinical observations, patient history, and epidemiological information. The expected result is Negative.  Fact Sheet for Patients:  BloggerCourse.com  Fact Sheet for Healthcare Providers:  SeriousBroker.it  This test is no t yet approved or cleared by the [place]  FDA and  has been authorized for detection and/or diagnosis of SARS-CoV-2 by FDA under an Emergency Use Authorization (EUA). This EUA will remain  in effect (meaning this test can be used) for the duration of the COVID-19 declaration under Section 564(b)(1) of the Act, 21 U.S.C.section 360bbb-3(b)(1), unless the authorization is terminated  or revoked sooner.       Influenza A by PCR NEGATIVE NEGATIVE   Influenza B by PCR NEGATIVE NEGATIVE    Comment: (NOTE) The Xpert Xpress SARS-CoV-2/FLU/RSV plus assay is intended as an aid in the diagnosis of influenza from Nasopharyngeal swab specimens and should not be used as a sole basis for treatment. Nasal washings and aspirates are unacceptable for Xpert Xpress SARS-CoV-2/FLU/RSV testing.  Fact Sheet for Patients: BloggerCourse.com  Fact Sheet for Healthcare Providers: SeriousBroker.it  This test is not yet approved or cleared by the [place]  FDA and has been authorized for detection and/or diagnosis of SARS-CoV-2 by FDA under an Emergency  Use Authorization (EUA). This EUA will remain in effect (meaning this test can be used) for the duration of the COVID-19 declaration under Section 564(b)(1) of the Act, 21 U.S.C. section 360bbb-3(b)(1), unless the authorization is terminated or revoked.     Resp Syncytial Virus by PCR NEGATIVE NEGATIVE    Comment: (NOTE) Fact Sheet for Patients: BloggerCourse.com  Fact Sheet for Healthcare Providers: SeriousBroker.it  This test is not yet approved or cleared by the [place]  FDA and has been authorized for detection and/or diagnosis of SARS-CoV-2 by FDA under an Emergency Use Authorization (EUA). This EUA will remain in effect (meaning this test can be used) for the duration of the COVID-19 declaration under Section 564(b)(1) of the Act, 21 U.S.C. section 360bbb-3(b)(1), unless the authorization is terminated or revoked.  Performed at [facility], [address] [place], [place] [address]      Psychiatric Specialty Exam: Physical Exam  Review of Systems  HENT:         Left neck lymph node swelling  Musculoskeletal:  Positive for gait problem.    Weight 217 lb (98.4 kg).There is no height or weight on file to calculate BMI.  General Appearance: Casual  Eye Contact:  Good  Speech:  Normal Rate  Volume:  Increased  Mood:  Anxious  Affect:  Appropriate  Thought Process:  Goal Directed  Orientation:  Full (Time, Place, and Person)  Thought Content:  Logical  Suicidal Thoughts:  No  Homicidal Thoughts:  No  Memory:  Immediate;   Good Recent;   Good Remote;   Good  Judgement:  Good  Insight:  Present  Psychomotor Activity:  Normal  Concentration:  Concentration: Good and Attention Span: Good  Recall:  Good  Fund of Knowledge:  Good  Language:  Good  Akathisia:  No  Handed:  Right  AIMS (if indicated):     Assets:  Communication Skills Desire for Improvement Housing Resilience Social  Support Talents/Skills Transportation  ADL's:  Intact  Cognition:  WNL  Sleep:  ok       01/14/2024  9:02 AM  Depression screen PHQ 2/9  Decreased Interest 0  Down, Depressed, Hopeless 1  PHQ - 2 Score 1    Assessment/Plan: Insomnia, unspecified type - Plan: mirtazapine  (REMERON ) 15 MG tablet  Bipolar I disorder, most recent episode depressed (HCC) - Plan: buPROPion  (WELLBUTRIN  XL) 150 MG 24 hr tablet, lamoTRIgine  (LAMICTAL ) 150 MG tablet  Discussed general health which is making him anxious.  He has a biopsy of his lymph node today and then here to follow-up to discuss the results.  He reported had a good support from his friends and church network.  We decided to keep the current medicine which is keeping him stable.  He has no rash, itching or tremors.  He sleeps okay.  Continue Wellbutrin  XL 100 mg daily, mirtazapine  50 mg at bedtime and Lamictal  150 mg daily.  Review labs from recent urgent care visit.  He is not taking any narcotic pain medicine.  Recommend to call us  back if is any question or any concern.  Follow-up in 3 months.   Follow Up Instructions:     I discussed the assessment and treatment plan with the patient. The patient was provided an opportunity to ask questions and all were answered. The patient agreed with the plan and demonstrated an understanding of the instructions.   The patient was advised to call back or seek an in-person evaluation if the symptoms worsen or if the condition fails to improve as anticipated.    Collaboration of Care: Other provider involved in patient's care AEB notes are available in epic to review  Patient/Guardian was advised Release of Information must be obtained prior to any record release in order to collaborate their care with an outside provider. Patient/Guardian was advised if they have not already done so to contact the registration department to sign all necessary forms in order for us  to release information regarding  their care.   Consent: Patient/Guardian gives verbal consent for treatment and assignment of benefits for services provided during this visit. Patient/Guardian expressed understanding and agreed to proceed.     Total encounter time 17 minutes which includes face-to-face time, chart reviewed, care coordination, order entry and documentation during this encounter.   Note: This document was prepared by [employer] voice dictation technology and any errors that results from this process are unintentional.    Arturo Late, MD 01/14/2024

## 2024-01-15 ENCOUNTER — Telehealth (INDEPENDENT_AMBULATORY_CARE_PROVIDER_SITE_OTHER): Payer: Self-pay

## 2024-01-15 ENCOUNTER — Telehealth (HOSPITAL_COMMUNITY): Admitting: Psychiatry

## 2024-01-15 LAB — SURGICAL PATHOLOGY

## 2024-01-15 NOTE — Telephone Encounter (Signed)
 Patient called back and asked if he could use ice, I instructed him ice was fine to use he can also rotated tylenol  and ibuprofen as needed for pain, he said it was very sore and tender.  He is scheduled to come to see Dr Lydia Sams on 01/16/24 to evaluate the site.

## 2024-01-15 NOTE — Telephone Encounter (Signed)
 Patient called and stated he had his biopsy yesterday, he stated his neck is very very swollen, he stated the lump is rock hard, he is just concerned that is this normal.

## 2024-01-17 ENCOUNTER — Encounter (INDEPENDENT_AMBULATORY_CARE_PROVIDER_SITE_OTHER): Payer: Self-pay | Admitting: Otolaryngology

## 2024-01-17 ENCOUNTER — Ambulatory Visit (INDEPENDENT_AMBULATORY_CARE_PROVIDER_SITE_OTHER): Admitting: Otolaryngology

## 2024-01-17 VITALS — BP 126/84 | HR 88 | Ht 69.0 in | Wt 217.0 lb

## 2024-01-17 DIAGNOSIS — C7989 Secondary malignant neoplasm of other specified sites: Secondary | ICD-10-CM

## 2024-01-17 DIAGNOSIS — R221 Localized swelling, mass and lump, neck: Secondary | ICD-10-CM

## 2024-01-17 DIAGNOSIS — C4492 Squamous cell carcinoma of skin, unspecified: Secondary | ICD-10-CM | POA: Diagnosis not present

## 2024-01-17 MED ORDER — OXYCODONE HCL 5 MG PO TABS: 5.0000 mg | ORAL_TABLET | Freq: Four times a day (QID) | ORAL | 0 refills | Status: AC | PRN

## 2024-01-17 NOTE — Progress Notes (Signed)
 Dear Dr. Clotilde Danker, Here is my assessment for our mutual patient, Douglas Hester. Thank you for allowing me the opportunity to care for your patient. Please do not hesitate to contact me should you have any other questions. Sincerely, Dr. Milon Aloe  Otolaryngology Clinic Note Referring provider: Dr. Clotilde Danker HPI:  Douglas Hester is a 63 y.o. male kindly referred by Dr. Clotilde Danker for evaluation of left neck mass.  Initial visit (12/2023): Patient reports: he had a URI on Mother's day weekend and got antibiotics which helped some. But about 2 weeks later, he noted a growth on his left neck - some tenderness, no fevers. Did not resolve so went to ED where CT was done. Associated symptoms include some headache, Bilateral ear discomfort (left some worse than right), and subjective fever, malaise and some night sweats (chest mostly, but not soaking wet). No sick contacts. Patient otherwise denies: - dysphagia, odynophagia, unintentional weight loss - changes in voice, shortness of breath, hemoptysis  - Denies tobacco use or alcohol use (used to, 4-5 beers on weekend but quit for past 4 years)  --------------------------------------------------------- 01/17/2024 Seen in follow up. 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 He reports neck pain from the biopsy, which is persistent. Swelling improving. No fevers. We had a long discussion regarding his diagnosis and next steps. Interestingly, no lymphoid tissue present in biopsy.   H&N Surgery: Tonsillectomy (as a child) Personal or FHx of bleeding dz or anesthesia difficulty: no  GLP-1: no AP/AC: no  Tobacco: no  PMHx: HTN, Insomnia, Anxiety  Independent Review of Additional Tests or Records:  CT Neck 01/03/2024 independently interpreted: large left jugulogastric node, internal cystic component; no significant stranding; left carotid medialized and this does appear to cause some asymmetry left OP and lingual  tonsil prominence; noted slight left GT sulcus and hypopharynx asymmetry; no other concerning nodes noted. CT Chest 01/03/2024: no large pulmonary nodules noted Path (01/14/2024):   PMH/Meds/All/SocHx/FamHx/ROS:   Past Medical History:  Diagnosis Date   Anxiety    Chronic GERD    Hypertension    Tachycardia      Past Surgical History:  Procedure Laterality Date   CHOLECYSTECTOMY N/A 10/11/2018   Procedure: LAPAROSCOPIC CHOLECYSTECTOMY WITH INTRAOPERATIVE CHOLANGIOGRAM;  Surgeon: Caralyn Chandler, MD;  Location: [facility];  Service: General;  Laterality: N/A;   COLONOSCOPY  ~ 2014   done by GI in [place]. Polyps were removed. Patient not aware what type of polyps these were   ESOPHAGOGASTRODUODENOSCOPY  early 1990s   MD found ulcer   INGUINAL HERNIA REPAIR Left ~ 2010   with Mesh.    VASECTOMY      Family History  Problem Relation Age of Onset   Lung cancer Mother    Lymphoma Father    Valvular heart disease Sister      Social Connections: Not on file      Current Outpatient Medications:    albuterol (VENTOLIN HFA) 108 (90 Base) MCG/ACT inhaler, Inhale 2 puffs into the lungs every 4 (four) hours as needed., Disp: , Rfl:    amoxicillin-clavulanate (AUGMENTIN) 875-125 MG tablet, Take 1 tablet by mouth 2 (two) times daily for 7 days., Disp: 14 tablet, Rfl: 0   Aspirin -Salicylamide-Caffeine (BC HEADACHE POWDER PO), Take 1 packet by mouth every evening., Disp: , Rfl:    Azelastine HCl 137 MCG/SPRAY SOLN, Place 1 spray into the nose., Disp: , Rfl:    buPROPion  (WELLBUTRIN  XL) 150 MG 24 hr tablet, Take 1  tablet (150 mg total) by mouth daily. Bridge to patient appt 12/16, Disp: 90 tablet, Rfl: 0   fluticasone (FLONASE) 50 MCG/ACT nasal spray, Place 1 spray into both nostrils 2 (two) times daily., Disp: , Rfl:    ibuprofen (ADVIL,MOTRIN) 200 MG tablet, Take 400 mg by mouth daily as needed for moderate pain., Disp: , Rfl:    ipratropium (ATROVENT) 0.06 % nasal spray, INSTILL 2 SPRAYS  (INTRANASAL) 3 TIMES PER DAY FOR 10 DAYS ADMINISTER INTO EACH NOSTRIL, Disp: , Rfl:    lamoTRIgine  (LAMICTAL ) 150 MG tablet, Take 1 tablet (150 mg total) by mouth daily., Disp: 30 tablet, Rfl: 2   lisinopril (ZESTRIL) 10 MG tablet, Take 10 mg by mouth., Disp: , Rfl:    loperamide  (IMODIUM  A-D) 2 MG tablet, Take 1 tablet (2 mg total) by mouth 4 (four) times daily as needed for diarrhea or loose stools., Disp: 30 tablet, Rfl: 0   Melatonin 5 MG TABS, Take 5 mg by mouth at bedtime as needed (sleep)., Disp: , Rfl:    metoprolol  succinate (TOPROL -XL) 25 MG 24 hr tablet, 1 tablet Orally Once a day for 30 days, Disp: , Rfl:    mirtazapine  (REMERON ) 15 MG tablet, Take 1 tablet (15 mg total) by mouth as needed (for sleep)., Disp: 90 tablet, Rfl: 0   olmesartan (BENICAR) 5 MG tablet, Take 5 mg by mouth daily., Disp: , Rfl:    oxyCODONE  (ROXICODONE ) 5 MG immediate release tablet, Take 1 tablet (5 mg total) by mouth every 6 (six) hours as needed for up to 7 days for severe pain (pain score 7-10)., Disp: 20 tablet, Rfl: 0   Physical Exam:   BP 126/84 (BP Location: Left Arm, Patient Position: Sitting, Cuff Size: Normal)   Pulse 88   Ht 5' 9 (1.753 m)   Wt 217 lb (98.4 kg)   SpO2 93%   BMI 32.05 kg/m   Salient findings:  CN II-XII intact Bilateral EAC clear and TM intact with well pneumatized middle ear spaces No lesions of oral cavity/oropharynx; dentition fair; strong gag; no noted palpable tongue base fullness or significant fullness over left glossotonsillar sulcus b/l No obviously palpable neck masses/lymphadenopathy/thyromegaly except left level 2 relatively mobile neck mass, not tender - no overlying skin changes, not fixed. No respiratory distress or stridor  Seprately Identifiable Procedures:  Prior to initiating any procedures, risks/benefits/alternatives were explained to the patient and verbal consent obtained.  Procedure Note - TFL; prior, not today The procedure was undertaken to  further evaluate the patient's complaint, with mirror exam inadequate for appropriate examination due to gag reflex and poor patient tolerance  Procedure:  Patient was identified as correct patient. Verbal consent was obtained. The nose was sprayed with oxymetazoline and 4% lidocaine . The The flexible laryngoscope was passed through the nose to view the nasal cavity, pharynx (oropharynx, hypopharynx) and larynx.  The larynx was examined at rest and during multiple phonatory tasks. Documentation was obtained and reviewed with patient. The scope was removed. The patient tolerated the procedure well.  Findings: The nasal cavity and nasopharynx did not reveal any masses or lesions, mucosa appeared to be without obvious lesions. The tongue base, pharyngeal walls, piriform sinuses, vallecula, epiglottis and postcricoid region are normal in appearance - unable to note any obvious worrisome masses, but lingual tonsils b/l mild prominence especially on left. The visualized portion of the subglottis and proximal trachea is widely patent. The vocal folds are mobile bilaterally. There are no lesions on the free edge  of the vocal folds nor elsewhere in the larynx worrisome for malignancy.       Electronically signed by: Evelina Hippo, MD 01/17/2024 11:18 AM   Impression & Plans:  Xaviar Lunn is a 63 y.o. male with:  1. 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 Mass of left side of neck    T?N1M? SCCa unknown primary. Interestingly, from biopsy, no lymphoid tissue noted. We had a long discussion regarding his diagnosis - currently CUP. Tonsils have been removed. We discussed workup - will proceed with DL/Bx and PET Added onto TB, will also refer him to Dr. Lurena Sally F/u 2 weeks post DL/Bx Will give oxycodone  for pain currently   Thank you for allowing me the opportunity to care for your patient. Please do not hesitate to contact me should you have any other  questions.  Sincerely, Milon Aloe, MD Otolaryngologist (ENT), [facility] Phone: [phone] Fax: [phone]  01/17/2024, 11:18 AM   I have personally spent 41 minutes involved in face-to-face and non-face-to-face activities for this patient on the day of the visit.  Professional time spent excludes any procedures performed but includes the following activities, in addition to those noted in the documentation: preparing to see the patient (review of outside documentation and results), performing a medically appropriate examination, counseling, ordering medications (oxycodone ), documenting in the electronic health record, independently interpreting results (path).

## 2024-01-17 NOTE — Patient Instructions (Signed)
 I have ordered an imaging study for you to complete prior to your next visit. Please call [facility] Scheduling at [phone] to schedule your imaging if you have not received a call within 24 hours. If you are unable to complete your imaging study prior to your next scheduled visit please call our office to let us know.

## 2024-01-18 ENCOUNTER — Telehealth: Payer: Self-pay | Admitting: Radiation Oncology

## 2024-01-18 ENCOUNTER — Telehealth (HOSPITAL_COMMUNITY): Payer: Self-pay | Admitting: *Deleted

## 2024-01-18 NOTE — Progress Notes (Signed)
 Oncology Nurse Navigator Documentation   Placed introductory call to new referral patient Glade Strausser.  Introduced myself as the H&N oncology nurse navigator that works with Dr. Lurena Sally to whom he has been referred by Dr. Lydia Sams.  He confirmed understanding of referral. Briefly explained my role as his navigator, provided my contact information.  Confirmed understanding of upcoming appts and [facility] location, explained arrival and registration process.   I encouraged him to call with questions/concerns as he moves forward with appts and procedures.   He verbalized understanding of information provided, expressed appreciation for my call.     Lynetta Saran RN, BSN, OCN Head & Neck Oncology Nurse Navigator [facility] Phone # [phone]  Fax # [phone]

## 2024-01-18 NOTE — Telephone Encounter (Signed)
 Pt called due to concern regarding taking Oxycodone  5 mg and Wellbutrin  together. Pt was advised by provider that prescribed the Oxycodone , that there is a contraindication with these two medications and that he would need to touch base with you. Pt f/u scheduled for 04/14/24.

## 2024-01-18 NOTE — Telephone Encounter (Signed)
 6/20 @ 1:16 pm Left voicemail for patient to call our office to be schedule for consult.

## 2024-01-19 LAB — AEROBIC/ANAEROBIC CULTURE W GRAM STAIN (SURGICAL/DEEP WOUND): Culture: NO GROWTH

## 2024-01-22 NOTE — Pre-Procedure Instructions (Signed)
 Surgical Instructions   Your procedure is scheduled on Monday, June 30th. Report to [facility] Main Entrance A at 2:00 P.M., then check in with the Admitting office. Any questions or running late day of surgery: call [phone]  Questions prior to your surgery date: call [phone], Monday-Friday, 8am-4pm. If you experience any cold or flu symptoms such as cough, fever, chills, shortness of breath, etc. between now and your scheduled surgery, please notify us  at the above number.     Remember:  Do not eat after midnight the night before your surgery   You may drink clear liquids until 1:00 PM the day of your surgery.   Clear liquids allowed are: Water, Non-Citrus Juices (without pulp), Carbonated Beverages, Clear Tea (no milk, honey, etc.), Black Coffee Only (NO MILK, CREAM OR POWDERED CREAMER of any kind), and Gatorade.    Take these medicines the morning of surgery with A SIP OF WATER  buPROPion  (WELLBUTRIN  XL)  lamoTRIgine  (LAMICTAL )  metoprolol  succinate (TOPROL -XL)   May take these medicines IF NEEDED: acetaminophen  (TYLENOL )  oxyCODONE  (ROXICODONE )   One week prior to surgery, STOP taking any Aspirin  (unless otherwise instructed by your surgeon) Aleve, Naproxen, Ibuprofen, Motrin, Advil, Goody's, BC's, all herbal medications, fish oil, and non-prescription vitamins.                     Do NOT Smoke (Tobacco/Vaping) for 24 hours prior to your procedure.  If you use a CPAP at night, you may bring your mask/headgear for your overnight stay.   You will be asked to remove any contacts, glasses, piercing's, hearing aid's, dentures/partials prior to surgery. Please bring cases for these items if needed.    Patients discharged the day of surgery will not be allowed to drive home, and someone needs to stay with them for 24 hours.  SURGICAL WAITING ROOM VISITATION Patients may have no more than 2 support people in the waiting area - these visitors may rotate.   Pre-op nurse  will coordinate an appropriate time for 1 ADULT support person, who may not rotate, to accompany patient in pre-op.  Children under the age of 76 must have an adult with them who is not the patient and must remain in the main waiting area with an adult.  If the patient needs to stay at the hospital during part of their recovery, the visitor guidelines for inpatient rooms apply.  Please refer to the [facility] website for the visitor guidelines for any additional information.   If you received a COVID test during your pre-op visit  it is requested that you wear a mask when out in public, stay away from anyone that may not be feeling well and notify your surgeon if you develop symptoms. If you have been in contact with anyone that has tested positive in the last 10 days please notify you surgeon.      Pre-operative CHG Bathing Instructions   You can play a key role in reducing the risk of infection after surgery. Your skin needs to be as free of germs as possible. You can reduce the number of germs on your skin by washing with CHG (chlorhexidine gluconate) soap before surgery. CHG is an antiseptic soap that kills germs and continues to kill germs even after washing.   DO NOT use if you have an allergy to chlorhexidine/CHG or antibacterial soaps. If your skin becomes reddened or irritated, stop using the CHG and notify one of our RNs at [phone].  TAKE A SHOWER THE NIGHT BEFORE SURGERY AND THE DAY OF SURGERY    Please keep in mind the following:  DO NOT shave, including legs and underarms, 48 hours prior to surgery.   You may shave your face before/day of surgery.  Place clean sheets on your bed the night before surgery Use a clean washcloth (not used since being washed) for each shower. DO NOT sleep with pet's night before surgery.  CHG Shower Instructions:  Wash your face and private area with normal soap. If you choose to wash your hair, wash first with your normal  shampoo.  After you use shampoo/soap, rinse your hair and body thoroughly to remove shampoo/soap residue.  Turn the water OFF and apply half the bottle of CHG soap to a CLEAN washcloth.  Apply CHG soap ONLY FROM YOUR NECK DOWN TO YOUR TOES (washing for 3-5 minutes)  DO NOT use CHG soap on face, private areas, open wounds, or sores.  Pay special attention to the area where your surgery is being performed.  If you are having back surgery, having someone wash your back for you may be helpful. Wait 2 minutes after CHG soap is applied, then you may rinse off the CHG soap.  Pat dry with a clean towel  Put on clean pajamas    Additional instructions for the day of surgery: DO NOT APPLY any lotions, deodorants, cologne, or perfumes.   Do not wear jewelry or makeup Do not wear nail polish, gel polish, artificial nails, or any other type of covering on natural nails (fingers and toes) Do not bring valuables to the hospital. [facility] is not responsible for valuables/personal belongings. Put on clean/comfortable clothes.  Please brush your teeth.  Ask your nurse before applying any prescription medications to the skin.

## 2024-01-23 ENCOUNTER — Encounter (HOSPITAL_COMMUNITY)
Admission: RE | Admit: 2024-01-23 | Discharge: 2024-01-23 | Disposition: A | Discharge status: Home / Self Care | Source: Ambulatory Visit | Attending: Otolaryngology | Admitting: Otolaryngology

## 2024-01-23 ENCOUNTER — Other Ambulatory Visit: Payer: Self-pay

## 2024-01-23 ENCOUNTER — Encounter (HOSPITAL_COMMUNITY): Payer: Self-pay

## 2024-01-23 VITALS — BP 123/86 | HR 80 | Temp 98.4°F | Resp 18 | Ht 70.0 in | Wt 214.5 lb

## 2024-01-23 DIAGNOSIS — Z01818 Encounter for other preprocedural examination: Secondary | ICD-10-CM | POA: Diagnosis present

## 2024-01-23 HISTORY — DX: Cardiac murmur, unspecified: R01.1

## 2024-01-23 LAB — CBC
HCT: 44.6 % (ref 39.0–52.0)
Hemoglobin: 14.4 g/dL (ref 13.0–17.0)
MCH: 28.9 pg (ref 26.0–34.0)
MCHC: 32.3 g/dL (ref 30.0–36.0)
MCV: 89.4 fL (ref 80.0–100.0)
Platelets: 262 10*3/uL (ref 150–400)
RBC: 4.99 MIL/uL (ref 4.22–5.81)
RDW: 12.2 % (ref 11.5–15.5)
WBC: 6.5 10*3/uL (ref 4.0–10.5)
nRBC: 0 % (ref 0.0–0.2)

## 2024-01-23 LAB — BASIC METABOLIC PANEL WITH GFR
Anion gap: 7 (ref 5–15)
BUN: 7 mg/dL — ABNORMAL LOW (ref 8–23)
CO2: 28 mmol/L (ref 22–32)
Calcium: 9.1 mg/dL (ref 8.9–10.3)
Chloride: 105 mmol/L (ref 98–111)
Creatinine, Ser: 0.92 mg/dL (ref 0.61–1.24)
GFR, Estimated: >60 mL/min (ref >60)
Glucose, Bld: 95 mg/dL (ref 70–99)
Potassium: 4.4 mmol/L (ref 3.5–5.1)
Sodium: 140 mmol/L (ref 135–145)

## 2024-01-23 NOTE — Progress Notes (Addendum)
 PCP - CHARMAINE BRIGHT  Cardiologist - Helene Donnice Bunker, MD   PPM/ICD - denies Device Orders - n/a Rep Notified - n/a  Chest x-ray - Chest CT 01-03-24 EKG - 01-23-24 Stress Test -  ECHO - 04-30-2017 Cardiac Cath -   Sleep Study - denies CPAP - n/a  DM denies  Blood Thinner Instructions:denies Aspirin  Instructions:n/a  ERAS Protcol -clear liquids until 1:00 pm.   COVID TEST- n/a   Anesthesia review: yes Hx of HTN, review EKG  Patient denies shortness of breath, fever, cough and chest pain at PAT appointment   All instructions explained to the patient, with a verbal understanding of the material. Patient agrees to go over the instructions while at home for a better understanding. Patient also instructed to self quarantine after being tested for COVID-19. The opportunity to ask questions was provided.

## 2024-01-25 ENCOUNTER — Ambulatory Visit (HOSPITAL_COMMUNITY)
Admission: RE | Admit: 2024-01-25 | Discharge: 2024-01-25 | Disposition: A | Discharge status: Home / Self Care | Source: Ambulatory Visit | Attending: Otolaryngology

## 2024-01-25 DIAGNOSIS — C4492 Squamous cell carcinoma of skin, unspecified: Secondary | ICD-10-CM | POA: Diagnosis present

## 2024-01-25 DIAGNOSIS — C7989 Secondary malignant neoplasm of other specified sites: Secondary | ICD-10-CM | POA: Insufficient documentation

## 2024-01-25 LAB — GLUCOSE, CAPILLARY: Glucose-Capillary: 87 mg/dL (ref 70–99)

## 2024-01-25 MED ORDER — FLUDEOXYGLUCOSE F - 18 (FDG) INJECTION
11.5000 | Freq: Once | INTRAVENOUS | Status: AC | PRN
  Administered 2024-01-25: 11.5 via INTRAVENOUS

## 2024-01-28 ENCOUNTER — Ambulatory Visit (HOSPITAL_COMMUNITY)
Admission: RE | Admit: 2024-01-28 | Discharge: 2024-01-28 | Disposition: A | Discharge status: Home / Self Care | Source: Ambulatory Visit | Attending: Otolaryngology | Admitting: Otolaryngology

## 2024-01-28 ENCOUNTER — Ambulatory Visit (HOSPITAL_COMMUNITY): Payer: Self-pay | Admitting: Anesthesiology

## 2024-01-28 ENCOUNTER — Encounter (HOSPITAL_COMMUNITY)
Admission: RE | Disposition: A | Discharge status: Home / Self Care | Payer: Self-pay | Source: Ambulatory Visit | Attending: Otolaryngology

## 2024-01-28 ENCOUNTER — Ambulatory Visit (INDEPENDENT_AMBULATORY_CARE_PROVIDER_SITE_OTHER): Admitting: Otolaryngology

## 2024-01-28 ENCOUNTER — Encounter (HOSPITAL_COMMUNITY): Payer: Self-pay

## 2024-01-28 DIAGNOSIS — R221 Localized swelling, mass and lump, neck: Secondary | ICD-10-CM | POA: Diagnosis not present

## 2024-01-28 DIAGNOSIS — C099 Malignant neoplasm of tonsil, unspecified: Secondary | ICD-10-CM

## 2024-01-28 DIAGNOSIS — C4442 Squamous cell carcinoma of skin of scalp and neck: Secondary | ICD-10-CM

## 2024-01-28 DIAGNOSIS — C7989 Secondary malignant neoplasm of other specified sites: Secondary | ICD-10-CM | POA: Diagnosis present

## 2024-01-28 DIAGNOSIS — F419 Anxiety disorder, unspecified: Secondary | ICD-10-CM | POA: Insufficient documentation

## 2024-01-28 DIAGNOSIS — I10 Essential (primary) hypertension: Secondary | ICD-10-CM | POA: Diagnosis not present

## 2024-01-28 DIAGNOSIS — J351 Hypertrophy of tonsils: Secondary | ICD-10-CM

## 2024-01-28 DIAGNOSIS — C801 Malignant (primary) neoplasm, unspecified: Secondary | ICD-10-CM | POA: Diagnosis not present

## 2024-01-28 DIAGNOSIS — K219 Gastro-esophageal reflux disease without esophagitis: Secondary | ICD-10-CM | POA: Diagnosis not present

## 2024-01-28 HISTORY — PX: DIRECT LARYNGOSCOPY: SHX5326

## 2024-01-28 SURGERY — LARYNGOSCOPY, DIRECT
Anesthesia: General

## 2024-01-28 MED ORDER — OXYCODONE HCL 5 MG/5ML PO SOLN
5.0000 mg | Freq: Once | ORAL | Status: AC | PRN
  Administered 2024-01-28: 5 mg via ORAL

## 2024-01-28 MED ORDER — PROPOFOL 500 MG/50ML IV EMUL
INTRAVENOUS | Status: DC | PRN
Start: 2024-01-28 — End: 2024-01-28
  Administered 2024-01-28: 200 ug/kg/min via INTRAVENOUS

## 2024-01-28 MED ORDER — LACTATED RINGERS IV SOLN: INTRAVENOUS | Status: DC

## 2024-01-28 MED ORDER — PROPOFOL 10 MG/ML IV BOLUS
INTRAVENOUS | Status: AC
  Filled 2024-01-28: qty 20

## 2024-01-28 MED ORDER — MIDAZOLAM HCL 2 MG/2ML IJ SOLN
INTRAMUSCULAR | Status: DC | PRN
  Administered 2024-01-28: 2 mg via INTRAVENOUS

## 2024-01-28 MED ORDER — FENTANYL CITRATE (PF) 100 MCG/2ML IJ SOLN
INTRAMUSCULAR | Status: AC
  Filled 2024-01-28: qty 2

## 2024-01-28 MED ORDER — OXYMETAZOLINE HCL 0.05 % NA SOLN
NASAL | Status: AC
  Filled 2024-01-28: qty 30

## 2024-01-28 MED ORDER — ROCURONIUM BROMIDE 10 MG/ML (PF) SYRINGE
PREFILLED_SYRINGE | INTRAVENOUS | Status: DC | PRN
  Administered 2024-01-28: 10 mg via INTRAVENOUS
  Administered 2024-01-28: 30 mg via INTRAVENOUS

## 2024-01-28 MED ORDER — FENTANYL CITRATE (PF) 250 MCG/5ML IJ SOLN
INTRAMUSCULAR | Status: AC
  Filled 2024-01-28: qty 5

## 2024-01-28 MED ORDER — OXYCODONE HCL 5 MG PO TABS: 5.0000 mg | ORAL_TABLET | Freq: Once | ORAL | Status: DC | PRN

## 2024-01-28 MED ORDER — OXYCODONE HCL 5 MG/5ML PO SOLN
ORAL | Status: AC
  Filled 2024-01-28: qty 5

## 2024-01-28 MED ORDER — OXYCODONE HCL 5 MG PO TABS: 5.0000 mg | ORAL_TABLET | Freq: Once | ORAL | Status: AC | PRN

## 2024-01-28 MED ORDER — ONDANSETRON HCL 4 MG/2ML IJ SOLN: 4.0000 mg | Freq: Once | INTRAMUSCULAR | Status: DC | PRN

## 2024-01-28 MED ORDER — FENTANYL CITRATE (PF) 100 MCG/2ML IJ SOLN: 25.0000 ug | INTRAMUSCULAR | Status: DC | PRN

## 2024-01-28 MED ORDER — DEXAMETHASONE SODIUM PHOSPHATE 10 MG/ML IJ SOLN
INTRAMUSCULAR | Status: DC | PRN
  Administered 2024-01-28: 10 mg via INTRAVENOUS

## 2024-01-28 MED ORDER — OXYMETAZOLINE HCL 0.05 % NA SOLN
NASAL | Status: DC | PRN
  Administered 2024-01-28: 1 via TOPICAL

## 2024-01-28 MED ORDER — SUGAMMADEX SODIUM 200 MG/2ML IV SOLN
INTRAVENOUS | Status: DC | PRN
  Administered 2024-01-28: 200 mg via INTRAVENOUS

## 2024-01-28 MED ORDER — LIDOCAINE 2% (20 MG/ML) 5 ML SYRINGE
INTRAMUSCULAR | Status: DC | PRN
  Administered 2024-01-28: 100 mg via INTRAVENOUS

## 2024-01-28 MED ORDER — CHLORHEXIDINE GLUCONATE 0.12 % MT SOLN
15.0000 mL | Freq: Once | OROMUCOSAL | Status: AC
  Administered 2024-01-28: 15 mL via OROMUCOSAL
  Filled 2024-01-28: qty 15

## 2024-01-28 MED ORDER — DEXMEDETOMIDINE HCL IN NACL 80 MCG/20ML IV SOLN
INTRAVENOUS | Status: DC | PRN
  Administered 2024-01-28: 8 ug via INTRAVENOUS

## 2024-01-28 MED ORDER — MEPERIDINE HCL 25 MG/ML IJ SOLN: 6.2500 mg | INTRAMUSCULAR | Status: DC | PRN

## 2024-01-28 MED ORDER — HYDROMORPHONE HCL 1 MG/ML IJ SOLN
INTRAMUSCULAR | Status: AC
  Filled 2024-01-28: qty 0.5

## 2024-01-28 MED ORDER — LIDOCAINE 2% (20 MG/ML) 5 ML SYRINGE
INTRAMUSCULAR | Status: AC
Start: 2024-01-28 — End: 2024-01-28
  Filled 2024-01-28: qty 5

## 2024-01-28 MED ORDER — ROCURONIUM BROMIDE 10 MG/ML (PF) SYRINGE
PREFILLED_SYRINGE | INTRAVENOUS | Status: AC
Start: 2024-01-28 — End: 2024-01-28
  Filled 2024-01-28: qty 10

## 2024-01-28 MED ORDER — MIDAZOLAM HCL 2 MG/2ML IJ SOLN
INTRAMUSCULAR | Status: AC
  Filled 2024-01-28: qty 2

## 2024-01-28 MED ORDER — PHENYLEPHRINE 80 MCG/ML (10ML) SYRINGE FOR IV PUSH (FOR BLOOD PRESSURE SUPPORT): PREFILLED_SYRINGE | INTRAVENOUS | Status: DC | PRN

## 2024-01-28 MED ORDER — OXYCODONE HCL 5 MG/5ML PO SOLN: 5.0000 mg | Freq: Once | ORAL | Status: DC | PRN

## 2024-01-28 MED ORDER — FENTANYL CITRATE (PF) 250 MCG/5ML IJ SOLN
INTRAMUSCULAR | Status: DC | PRN
  Administered 2024-01-28: 50 ug via INTRAVENOUS
  Administered 2024-01-28: 25 ug via INTRAVENOUS
  Administered 2024-01-28: 50 ug via INTRAVENOUS

## 2024-01-28 MED ORDER — ONDANSETRON HCL 4 MG/2ML IJ SOLN: 4.0000 mg | Freq: Four times a day (QID) | INTRAMUSCULAR | Status: DC | PRN

## 2024-01-28 MED ORDER — PROPOFOL 10 MG/ML IV BOLUS
INTRAVENOUS | Status: DC | PRN
  Administered 2024-01-28: 150 mg via INTRAVENOUS

## 2024-01-28 MED ORDER — ORAL CARE MOUTH RINSE: 15.0000 mL | Freq: Once | OROMUCOSAL | Status: AC

## 2024-01-28 MED ORDER — FENTANYL CITRATE (PF) 100 MCG/2ML IJ SOLN
25.0000 ug | INTRAMUSCULAR | Status: DC | PRN
  Administered 2024-01-28 (×3): 50 ug via INTRAVENOUS

## 2024-01-28 MED ORDER — EPINEPHRINE HCL (NASAL) 0.1 % NA SOLN
NASAL | Status: AC
  Filled 2024-01-28: qty 30

## 2024-01-28 MED ORDER — ONDANSETRON HCL 4 MG/2ML IJ SOLN
INTRAMUSCULAR | Status: DC | PRN
  Administered 2024-01-28: 4 mg via INTRAVENOUS

## 2024-01-28 MED ORDER — OXYCODONE HCL 5 MG PO TABS: 5.0000 mg | ORAL_TABLET | Freq: Four times a day (QID) | ORAL | 0 refills | Status: AC | PRN

## 2024-01-28 SURGICAL SUPPLY — 25 items
BAG COUNTER SPONGE SURGICOUNT (BAG) ×1 IMPLANT
BALLOON PULM 15 16.5 18X75 (BALLOONS) IMPLANT
CANISTER SUCTION 3000ML PPV (SUCTIONS) ×1 IMPLANT
CNTNR URN SCR LID CUP LEK RST (MISCELLANEOUS) IMPLANT
COVER BACK TABLE 60X90IN (DRAPES) ×1 IMPLANT
COVER MAYO STAND STRL (DRAPES) ×1 IMPLANT
DRAPE HALF SHEET 40X57 (DRAPES) ×1 IMPLANT
DRSG TELFA 3X8 NADH STRL (GAUZE/BANDAGES/DRESSINGS) ×1 IMPLANT
GAUZE 4X4 16PLY ~~LOC~~+RFID DBL (SPONGE) ×1 IMPLANT
GLOVE BIO SURGEON STRL SZ7.5 (GLOVE) ×1 IMPLANT
GOWN STRL REUS W/ TWL LRG LVL3 (GOWN DISPOSABLE) IMPLANT
GUARD TEETH (MISCELLANEOUS) ×1 IMPLANT
KIT TURNOVER KIT B (KITS) ×1 IMPLANT
NDL HYPO 25GX1X1/2 BEV (NEEDLE) IMPLANT
NDL TRANS ORAL INJECTION (NEEDLE) IMPLANT
NEEDLE HYPO 25GX1X1/2 BEV (NEEDLE) IMPLANT
NEEDLE TRANS ORAL INJECTION (NEEDLE) IMPLANT
NS IRRIG 1000ML POUR BTL (IV SOLUTION) ×1 IMPLANT
PAD ARMBOARD POSITIONER FOAM (MISCELLANEOUS) ×2 IMPLANT
PATTIES SURGICAL .5X1.5 (GAUZE/BANDAGES/DRESSINGS) ×1 IMPLANT
POSITIONER HEAD DONUT 9IN (MISCELLANEOUS) IMPLANT
SOL ANTI FOG 6CC (MISCELLANEOUS) IMPLANT
SURGILUBE 2OZ TUBE FLIPTOP (MISCELLANEOUS) IMPLANT
TOWEL GREEN STERILE FF (TOWEL DISPOSABLE) ×2 IMPLANT
TUBE CONNECTING 12X1/4 (SUCTIONS) ×1 IMPLANT

## 2024-01-28 NOTE — Discharge Instructions (Addendum)
 Post Anesthesia Guidelines  During recovery from anesthesia  You may feel drowsy and reflexes may be slowed for 24 hours:  -Do not drive, use machinery, appliances, ride bicycles or scooters  -Do not consume alcohol  -Do not make important decisions   Eating and drinking:  -Drink plenty of liquids today  -Avoid fried or spicy foods today  -Return to your regular diet slowly over the next 24 hours  -Please call if unable to keep fluids down   Surgery Discharge Instructions:  Call clinic or return to ED if you: - develop a fever greater than 101.4 - have shaking chills or are feeling ill - become short of breath - have uncontrollable nausea or vomiting - can't hold down food or liquids or feel as though you are getting dehydrated - any other acute events, problems, or concerns  Wound Care/Dressings/Drain Instructions:  - Your throat will be sore for a few days, and your tongue can also be sore. This is expected, but should resolve with time - You may have some blood tinged drainage from the nose or mouth with some bloody cough. This should also resolve in a few days. Please call us  or go to ED if there is severe bleeding  Medications: - Resume your regular home medications except as detailed in the medication reconciliation.  - For pain, take tylenol  1000mg  every 6 hours alternating with Ibuprofen 400mg  every 6 hours. If that is not sufficient, a stronger pain medication has been prescribed to you (oxycodone  5mg  tablet every 6 hours as needed) - the narcotic medication can be crushed. Do not mix with any other narcotic medication.   Follow Up:  - A follow up appointment should be scheduled for you after discharge. Please call us  if you do not know exact date and time of your follow up.  Activity/Restrictions:  - Resume your regular activities, as tolerated. Avoid heavy lifting or straining (more than 10 lbs) for a week  Diet: - Eat soft food for next 48 hours, then can start  eating normal diet.   Additional Instructions: - Please take an over the counter stool softener while taking narcotic pain medication - DO NOT MIX NARCOTIC PAIN MEDICATIONS OR TAKE NARCOTIC PRESCRIPTIONS AT THE SAME TIME - DO NOT DRIVE OR OPERATE HEAVY MACHINERY WHILE ON NARCOTICS  - DO NOT TAKE MORE THAN 4 GRAMS (4000mg ) OF TYLENOL  (ACETAMINOPHEN ) IN 24 HOURS

## 2024-01-28 NOTE — H&P (Signed)
 Pre-Operative H&P - Day Of Surgery Patient Name: Douglas Hester Date:   01/28/2024  HPI: Douglas Hester is a 63 y.o. male who presents today for operative treatment of left neck mass, squamous cell carcinoma of unknown primary. Patient denies recent significant changes to health or significant new medications or physiologic change in condition which would immediately impact plans. No new types of therapy has been initiated that would change the plan or the appropriateness of the plan.   ROS:  A complete review of systems was obtained and is otherwise negative.   PMH:  Past Medical History:  Diagnosis Date   Anxiety    Chronic GERD    Heart murmur    Hypertension    Tachycardia     PSH:  Past Surgical History:  Procedure Laterality Date   CHOLECYSTECTOMY N/A 10/11/2018   Procedure: LAPAROSCOPIC CHOLECYSTECTOMY WITH INTRAOPERATIVE CHOLANGIOGRAM;  Surgeon: Curvin Deward MOULD, MD;  Location: [facility];  Service: General;  Laterality: N/A;   COLONOSCOPY  ~ 2014   done by GI in [place]. Polyps were removed. Patient not aware what type of polyps these were   ESOPHAGOGASTRODUODENOSCOPY  early 1990s   MD found ulcer   INGUINAL HERNIA REPAIR Left ~ 2010   with Mesh.    VASECTOMY      MEDS:   Current Facility-Administered Medications:    lactated ringers  infusion, , Intravenous, Continuous, Ellender, Bernardino SQUIBB, MD, Last Rate: 10 mL/hr at 01/28/24 1622, Continued from Pre-op at 01/28/24 1622  ALLERGIES: Patient has no known allergies.  EXAM: Vitals: BP 131/81 (BP Location: Right Arm)   Pulse 87   Temp 97.6 F (36.4 C) (Oral)   Resp 18   Ht 5' 9 (1.753 m)   Wt 98 kg   SpO2 99%   BMI 31.90 kg/m   General Awake, at baseline alertness.   HEENT No scleral icterus or conjunctival hemorrhage. Globe position appears normal. External ears  normal. Nose patent without rhinorrhea. Left level 2 neck mass, ~3x2 cm. No thyromegaly  Cardiovascular No cyanosis.  Pulmonary No audible stridor. Breathing  easily with no labor.  Neuro Symmetric facial movement.   Psychiatry Appropriate affect and mood.  Skin No scars or lesions on face or neck.  Extermities Moves all extremities with normal range of motion.   Other Findings None.   Assessment & Plan: [name] has diagnoses of squamous cell carcinoma of unknown primary and will go to the OR today for direct laryngoscopy with biopsy, other indicated procedures. We discussed the typical protocol for this including ipsilateral tonsillectomy and then possible contralateral tonsillectomy and possible lingual tonsillectomy and risks. He opted if possible to just do lingual tonsil biopsy instead of tonsillectomy if possible. Informed consent was obtained and available in EMR today. All questions have been answered, and risks/benefits/alternatives of procedure as noted in the consent were discussed in a quiet area. Questions were invited and answered. The patient expressed understanding, provided consent and wished to proceed despite risks.  Joycelynn Fritsche B Gaelan Glennon 01/28/2024 4:25 PM

## 2024-01-28 NOTE — Transfer of Care (Signed)
 Immediate Anesthesia Transfer of Care Note  Patient: Douglas Hester  Procedure(s) Performed: LARYNGOSCOPY, DIRECT WITH BIOPSIES  Patient Location: PACU  Anesthesia Type:General  Level of Consciousness: awake, alert , and oriented  Airway & Oxygen Therapy: Patient Spontanous Breathing and Patient connected to face mask oxygen  Post-op Assessment: Report given to RN and Post -op Vital signs reviewed and stable  Post vital signs: Reviewed and stable  Last Vitals:  Vitals Value Taken Time  BP 127/83 01/28/24 17:40  Temp 36.4 C 01/28/24 17:40  Pulse 98 01/28/24 17:43  Resp 19 01/28/24 17:43  SpO2 95 % 01/28/24 17:43  Vitals shown include unfiled device data.  Last Pain:  Vitals:   01/28/24 1421  TempSrc:   PainSc: 0-No pain      Patients Stated Pain Goal: 0 (01/28/24 1421)  Complications: No notable events documented.

## 2024-01-28 NOTE — Op Note (Signed)
 Otolaryngology Operative note  Douglas Hester Date/Time of Admission: 01/28/2024  1:54 PM  CSN: [id number];MRN:[id number]  DOB: March 04, 1961 Age: 63 y.o. Location: [facility] OR    Pre-Op Diagnosis: Squamous cell carcinoma of head and neck of Unknown Primary Left Neck Mass  Post-Op Diagnosis: Same  Procedure: Procedure(s): Suspension microdirect Laryngoscopy with Biopsy with use of operating telescope - CPT 31536  Surgeon: Douglas Blanch, MD  Anesthesia type:  General  Anesthesiologist: Anesthesiologist: Douglas Manus, MD CRNA: Douglas Rochele PARAS, CRNA   Staff: Circulator: Douglas Shona NOVAK, RN Scrub Person: Douglas Hester  Implants: * No implants in log *  Specimens: ID Type Source Tests Collected by Time Destination  1 : LEFT LINGUAL TONSIL, ASSESS FOR HPV Tissue PATH ENT biopsy SURGICAL PATHOLOGY Hester Douglas NOVAK, MD 01/28/2024 1704   2 : RIGHT LINGUAL TONSIL, ASSESS FOR HPV Tissue PATH ENT biopsy SURGICAL PATHOLOGY Hester Douglas NOVAK, MD 01/28/2024 1714   3 : LEFT TONSIL FOSSA Tissue PATH ENT biopsy SURGICAL PATHOLOGY Hester Douglas NOVAK, MD 01/28/2024 1718     EBL: 25cc  Drains: None  Post-op disposition and condition: PACU, hemodynamically stable  Findings: Bilateral tonsillar fossae scar with evidence of prior tonsillectomy with small polypoid area (<71mm) over left tonsillar fossa which appeared benign, but was biopsied.  Prominent lingual tonsil but left was clearly much more firm than right - this was biopsied extensively. Limited directed biopsies taken of right lingual tonsli No other noted lesions over pyriform or vocal folds  Complications: None apparent  Indications and consent:  Douglas Hester is a 63 y.o. 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 PET scan and TFL was concerning for a lingual tonsil primary The patient's options were discussed, including risks/benefits/alternatives for each option including possible lingual  tonsillectomy (which he preferred to avoid if possible). Patient expressed understanding, and despite these risks, consented and decided to proceed with above procedures. Informed consent was signed before proceeding.  Procedure: The patient was identified in the preoperative area, consent confirmed, transported to the operating suite. They were transferred to the operating room table and placed in a supine position. After induction of general endotracheal anesthesia and establishment of airway, a surgeon initiated time out was performed.  The bed was rotated 90 degrees. Intraoperative decadron  was administered. The patient was then padded and draped appropriately.     The oral cavity was first examined and no abnormalities were identified. We then proceeded with our direct laryngoscopy. A tooth guard was placed to protect the maxillary gingiva. The Dedo laryngoscope was inserted into the oral cavity and advanced distally. Examination of the oral cavity, oropharynx, hypopharynx and larynx was performed in a sequential manner at that time with findings above. The operating telescope (0 degree) was used to examine the areas described above. The oropharynx was then placed in suspension and cup forceps were used to obtain biopsies (multiple of left lingual tonsil, some right lingual tonsil) and specimen passed off the field. Hemostasis was achieved with afrin pledgets. The hypopharynx and oropharynx were suctioned. All instruments were removed.  This concluded our procedure. The patient tolerated the procedure well without any apparent immediate post operative complications.  The patient was rotated back to their original position, gently awakened from general anesthesia and taken to the PACU in stable condition. I was present and participated through the entirety of the procedure.   Douglas Hester B Douglas Hester

## 2024-01-28 NOTE — Progress Notes (Signed)
 Radiation Oncology         [phone]) [phone] ________________________________  Initial outpatient Consultation  Name: Douglas Hester MRN: [id number]  Date: 01/29/2024  DOB: 07-26-1961  CC:Katina Pfeiffer, PA-C  Tobie Eldora NOVAK, MD   REFERRING PHYSICIAN: Tobie Eldora NOVAK, MD  DIAGNOSIS: No diagnosis found.  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   CHIEF COMPLAINT: Here to discuss management of neck cancer  HISTORY OF PRESENT ILLNESS::Douglas Hester is a 63 y.o. male who presented to the ED on 01/03/24 with complains of headaches and an enlarged lymph node to his left neck. An Ultrasound was preformed at that time showing a left submandibular mass suspicious for a necrotic lymph node.   He then underwent a CT soft tissue neck to further investigate his symptoms. Scans showed a necrotic 2.6 cm left level II mass is suspicious a conglomerate nodal metastasis. CT chest and head were negative for any abnormalities.     Subsequently, the patient was referred to [name] on 01/04/24 who suggested undergoing a core biopsy of the left neck mass to further investigate his symptoms. Patient underwent procedure on 6/16. Surgical pathology indicated moderately differentiated squamous cell carcinoma. Immunohistochemical stain for p16 shows strong blocklike positivity, EBER ISH is negative.   Pertinent imaging thus far includes PET scan performed on 6/27 revealing a necrotic left-sided level 2 lymph node measuring 3 cm and symmetric upper pharyngeal uptake extending to the left side of the vallecular. No additional areas of abnormal radiotracer uptake at this time to suggest additional areas of disease.  Patient underwent a laryngoscopy with biopsy on 6/29 under the care of [name]. Surgical pathology still bending.    Swallowing issues, if any: ***  Weight Changes: none   Pain status: ***  Other symptoms:  ***  Tobacco history, if any: none   ETOH abuse, if any: patient reports quitting about 4 years ago, used to drink 4-5 beers on weekends.    Prior cancers, if any: no  PREVIOUS RADIATION THERAPY: No  PAST MEDICAL HISTORY:  has a past medical history of Anxiety, Chronic GERD, Heart murmur, Hypertension, and Tachycardia.    PAST SURGICAL HISTORY: Past Surgical History:  Procedure Laterality Date   CHOLECYSTECTOMY N/A 10/11/2018   Procedure: LAPAROSCOPIC CHOLECYSTECTOMY WITH INTRAOPERATIVE CHOLANGIOGRAM;  Surgeon: Curvin Deward MOULD, MD;  Location: [facility];  Service: General;  Laterality: N/A;   COLONOSCOPY  ~ 2014   done by GI in [place]. Polyps were removed. Patient not aware what type of polyps these were   ESOPHAGOGASTRODUODENOSCOPY  early 1990s   MD found ulcer   INGUINAL HERNIA REPAIR Left ~ 2010   with Mesh.    VASECTOMY      FAMILY HISTORY: family history includes Lung cancer in his mother; Lymphoma in his father; Valvular heart disease in his sister.  SOCIAL HISTORY:  reports that he has never smoked. He has never used smokeless tobacco. He reports current alcohol use. He reports that he does not use drugs.  ALLERGIES: Patient has no known allergies.  MEDICATIONS:  Current Outpatient Medications  Medication Sig Dispense Refill   acetaminophen  (TYLENOL ) 500 MG tablet Take 500-1,000 mg by mouth 2 (two) times daily as needed (pain.).     buPROPion  (WELLBUTRIN  XL) 150 MG 24 hr tablet Take 1 tablet (150 mg total) by mouth daily. Bridge to patient appt 12/16 (Patient taking differently: Take 150 mg by mouth  every evening. Bridge to patient appt 12/16) 90 tablet 0   ibuprofen (ADVIL,MOTRIN) 200 MG tablet Take 200-600 mg by mouth daily as needed (pain/inflammation.).     lamoTRIgine  (LAMICTAL ) 150 MG tablet Take 1 tablet (150 mg total) by mouth daily. (Patient taking differently: Take 150 mg by mouth every evening.) 30 tablet 2   Melatonin 5 MG TABS Take 5 mg by mouth at bedtime as  needed (sleep).     metoprolol  succinate (TOPROL -XL) 25 MG 24 hr tablet Take 25 mg by mouth every evening.     mirtazapine  (REMERON ) 15 MG tablet Take 1 tablet (15 mg total) by mouth as needed (for sleep). 90 tablet 0   olmesartan (BENICAR) 5 MG tablet Take 5 mg by mouth every evening.     oxyCODONE  (ROXICODONE ) 5 MG immediate release tablet Take 1 tablet (5 mg total) by mouth every 6 (six) hours as needed for up to 7 days. 20 tablet 0   No current facility-administered medications for this encounter.   Facility-Administered Medications Ordered in Other Encounters  Medication Dose Route Frequency Provider Last Rate Last Admin   fentaNYL  (SUBLIMAZE ) injection 25-50 mcg  25-50 mcg Intravenous Q5 min PRN Mallory Manus, MD       fentaNYL  (SUBLIMAZE ) injection 25-50 mcg  25-50 mcg Intravenous Q5 min PRN Maryclare Cornet, MD   50 mcg at 01/28/24 1754   lactated ringers  infusion   Intravenous Continuous Ellender, Bernardino SQUIBB, MD 10 mL/hr at 01/28/24 1422 Restarted at 01/28/24 1638   meperidine (DEMEROL) injection 6.25-12.5 mg  6.25-12.5 mg Intravenous Q5 min PRN Mallory Manus, MD       ondansetron  (ZOFRAN ) injection 4 mg  4 mg Intravenous Once PRN Mallory Manus, MD       ondansetron  (ZOFRAN ) injection 4 mg  4 mg Intravenous Q6H PRN Hodierne, Adam, MD       oxyCODONE  (Oxy IR/ROXICODONE ) immediate release tablet 5 mg  5 mg Oral Once PRN Mallory Manus, MD       Or   oxyCODONE  (ROXICODONE ) 5 MG/5ML solution 5 mg  5 mg Oral Once PRN Mallory Manus, MD        REVIEW OF SYSTEMS:  Notable for that above.   PHYSICAL EXAM:  vitals were not taken for this visit.   General: Alert and oriented, in no acute distress HEENT: Head is normocephalic. Extraocular movements are intact. Oropharynx is notable for ***. Neck: Neck is notable for *** Heart: Regular in rate and rhythm with no murmurs, rubs, or gallops. Chest: Clear to auscultation bilaterally, with no rhonchi, wheezes, or rales. Abdomen: Soft, nontender,  nondistended, with no rigidity or guarding. Extremities: No cyanosis or edema. Lymphatics: see Neck Exam Skin: No concerning lesions. Musculoskeletal: symmetric strength and muscle tone throughout. Neurologic: Cranial nerves II through XII are grossly intact. No obvious focalities. Speech is fluent. Coordination is intact. Psychiatric: Judgment and insight are intact. Affect is appropriate.   ECOG = ***  0 - Asymptomatic (Fully active, able to carry on all predisease activities without restriction)  1 - Symptomatic but completely ambulatory (Restricted in physically strenuous activity but ambulatory and able to carry out work of a light or sedentary nature. For example, light housework, office work)  2 - Symptomatic, <50% in bed during the day (Ambulatory and capable of all self care but unable to carry out any work activities. Up and about more than 50% of waking hours)  3 - Symptomatic, >50% in bed, but not bedbound (Capable of only limited self-care,  confined to bed or chair 50% or more of waking hours)  4 - Bedbound (Completely disabled. Cannot carry on any self-care. Totally confined to bed or chair)  5 - Death   Raylene MM, Creech RH, Tormey DC, et al. [phone]). Toxicity and response criteria of the [facility]. Am. DOROTHA Bridges. Oncol. 5 (6): 649-55   LABORATORY DATA:  Lab Results  Component Value Date   WBC 6.5 01/23/2024   HGB 14.4 01/23/2024   HCT 44.6 01/23/2024   MCV 89.4 01/23/2024   PLT 262 01/23/2024   CMP     Component Value Date/Time   NA 140 01/23/2024 1200   K 4.4 01/23/2024 1200   CL 105 01/23/2024 1200   CO2 28 01/23/2024 1200   GLUCOSE 95 01/23/2024 1200   BUN 7 (L) 01/23/2024 1200   CREATININE 0.92 01/23/2024 1200   CALCIUM 9.1 01/23/2024 1200   PROT 7.3 01/03/2024 1623   ALBUMIN 4.3 01/03/2024 1623   AST 21 01/03/2024 1623   ALT 35 01/03/2024 1623   ALKPHOS 90 01/03/2024 1623   BILITOT 0.3 01/03/2024 1623   GFRNONAA >60  01/23/2024 1200   GFRAA >60 10/11/2018 0758      Lab Results  Component Value Date   TSH 0.592 04/30/2017     RADIOGRAPHY: NM PET Image Initial (PI) Skull Base To Thigh (F-18 FDG) Result Date: 01/25/2024 CLINICAL DATA:  Initial treatment strategy for head neck squamous cell carcinoma. EXAM: NUCLEAR MEDICINE PET SKULL BASE TO THIGH TECHNIQUE: 11.5 mCi F-18 FDG was injected intravenously. Full-ring PET imaging was performed from the skull base to thigh after the radiotracer. CT data was obtained and used for attenuation correction and anatomic localization. Fasting blood glucose: 87 mg/dl COMPARISON:  CT scan head, neck and chest 01/03/2024. FINDINGS: Mediastinal blood pool activity: SUV max 2.3 Liver activity: SUV max 3.0 NECK: On the prior CT scan there is a necrotic left-sided level 2 lymph node measuring 2.6 cm. Today this measures 3.0 cm on image 33 of series 4 of the CT scan. There is rim like marginal uptake greatest lateral with maximum SUV 9.1 consistent with a malignant lymph node. There are no additional areas of abnormal nodal uptake in the neck including submandibular or posterior triangle. There is some near symmetric upper pharyngeal uptake extending to the left side of the vallecular. Please correlate with clinical findings and recent laryngoscopy. Incidental CT findings: Please correlate with recent contrast neck CT scan. Grossly the paranasal sinuses and mastoid air cells are clear. The parotid glands, submandibular glands and thyroid  gland are preserved. CHEST: No specific abnormal uptake above blood pool in the axillary regions, hilum or mediastinum. No abnormal lung uptake. Incidental CT findings: Relate with recent chest CT scan with contrast. Heart is nonenlarged. Mild coronary artery calcifications are seen. Patulous esophagus. The thoracic aorta has a normal course and caliber. There is a bovine type aortic arch, normal variant. Breathing motion. Mild dependent atelectasis. No  consolidation, pneumothorax or effusion. ABDOMEN/PELVIS: There is physiologic distribution radiotracer along the parenchymal organs, bowel and renal collecting systems. No specific abnormal nodal uptake. Incidental CT findings: On this limited noncontrast CT examination, grossly the liver, spleen, adrenal glands are unremarkable. Previous cholecystectomy. Mild parenchymal atrophy globally of the pancreas. No abnormal calcifications seen within either kidney nor along the course of either ureter. Slight wall thickening of the underdistended urinary bladder. The large bowel has a normal course and caliber. Scattered colonic stool. Left-sided colonic diverticula. Normal appendix. The stomach  and small bowel are nondilated. Mild vascular calcifications are identified. SKELETON: No abnormal uptake along the visualized osseous structures. Incidental CT findings: Scattered degenerative changes. IMPRESSION: Left-sided level 2 necrotic lymph node identified once again has some marginal abnormal uptake consistent with known history of neoplasm. No additional areas of abnormal radiotracer uptake at this time to suggest additional areas of disease. Electronically Signed   By: Ranell Bring M.D.   On: 01/25/2024 15:55   US  CORE BIOPSY (LYMPH NODES) Result Date: 01/14/2024 Jenna Cordella LABOR, MD     01/14/2024  1:40 PM Interventional Radiology Procedure Note Procedure: U/S Guided Biopsy and aspirationof left neck mass Complications: None Estimated Blood Loss: < 10 mL Findings: Right neck mass possibly infected or necrotic.  CNB and aspiraton 3mL.  One core samples obtained and sent to Pathology.  Fluid sent for cultures. Cordella LABOR Jenna, MD   CT Soft Tissue Neck W Contrast Result Date: 01/03/2024 CLINICAL DATA:  Neck mass, nonpulsatile EXAM: CT NECK WITH CONTRAST TECHNIQUE: Multidetector CT imaging of the neck was performed using the standard protocol following the bolus administration of intravenous contrast. RADIATION DOSE  REDUCTION: This exam was performed according to the departmental dose-optimization program which includes automated exposure control, adjustment of the mA and/or kV according to patient size and/or use of iterative reconstruction technique. CONTRAST:  OMNIPAQUE  IOHEXOL  300 MG/ML  SOLN COMPARISON:  None Available. FINDINGS: Pharynx and larynx: No definite/obvious mass. No inflammatory change. Salivary glands: No inflammation, mass, or stone. Thyroid : Normal. Lymph nodes: Necrotic 2.6 cm left level II mass is suspicious a conglomerate nodal metastasis. No other pathologically enlarged lymph nodes identified. Head CT could provide more sensitive nodal staging if clinically warranted. Vascular: Patent. Limited intracranial: Negative. Visualized orbits: Negative. Mastoids and visualized paranasal sinuses: Clear. Skeleton: No acute or aggressive process. Upper chest: Clear lung apices. IMPRESSION: Necrotic 2.6 cm left level II mass is suspicious a conglomerate nodal metastasis. No definite/obvious primary malignancy identified; however, recommend ENT consultation for direct mucosal evaluation. Electronically Signed   By: Gilmore GORMAN Molt M.D.   On: 01/03/2024 20:49   CT Chest W Contrast Result Date: 01/03/2024 EXAM: CT CHEST WITH CONTRAST 01/03/2024 06:58:12 PM TECHNIQUE: CT of the chest was performed with the administration of intravenous iohexol  (OMNIPAQUE ) 300 MG/ML solution. Multiplanar reformatted images are provided for review. Automated exposure control, iterative reconstruction, and/or weight based adjustment of the mA/kV was utilized to reduce the radiation dose to as low as reasonably achievable. COMPARISON: CTA chest dated 07/09/2022. CLINICAL HISTORY: Dyspnea, chronic, unclear etiology; neck mass; family hx lymphoma. 63 y/o male. Pt has been having headaches and enlarged lymph nodes for 3 weeks. Pt had US  today which showed a mass on the left side of his neck. FINDINGS: MEDIASTINUM: The heart is  normal in size with prominent epicardial fat. The central airways are clear. LYMPH NODES: Supraclavicular regions are within normal limits. Additional left neck findings described on dedicated CT neck report. LUNGS AND PLEURA: No focal consolidation or pulmonary edema. No pleural effusion or pneumothorax. SOFT TISSUES/BONES: No acute abnormality of the bones or soft tissues. UPPER ABDOMEN: Status post cholecystectomy. Limited images of the upper abdomen demonstrate no acute abnormality. IMPRESSION: 1. Negative CT chest. 2. Additional left neck findings described on dedicated CT neck report. Electronically signed by: Pinkie Pebbles MD 01/03/2024 07:06 PM EDT RP Workstation: [id number]   CT Head Wo Contrast Result Date: 01/03/2024 CLINICAL DATA:  Headache. EXAM: CT HEAD WITHOUT CONTRAST TECHNIQUE: Contiguous axial images were obtained  from the base of the skull through the vertex without intravenous contrast. RADIATION DOSE REDUCTION: This exam was performed according to the departmental dose-optimization program which includes automated exposure control, adjustment of the mA and/or kV according to patient size and/or use of iterative reconstruction technique. COMPARISON:  None Available. FINDINGS: Brain: No intracranial hemorrhage, mass effect, or midline shift. Brain volume is normal for age. No hydrocephalus. The basilar cisterns are patent. Minimal periventricular chronic small vessel ischemia. No evidence of territorial infarct or acute ischemia. No extra-axial or intracranial fluid collection. Vascular: No hyperdense vessel or unexpected calcification. Skull: No fracture or focal lesion. Sinuses/Orbits: Paranasal sinuses and mastoid air cells are clear. The visualized orbits are unremarkable. Other: None. IMPRESSION: 1. No acute intracranial abnormality.  No explanation for headache. 2. Minimal chronic small vessel ischemia. Electronically Signed   By: Andrea Gasman M.D.   On: 01/03/2024 19:02   US  SOFT  TISSUE HEAD & NECK (NON-THYROID ) Result Date: 01/03/2024 CLINICAL DATA:  LEFT neck mass.  Family history of lymphoma. EXAM: ULTRASOUND OF HEAD/NECK SOFT TISSUES TECHNIQUE: Ultrasound examination of the head and neck soft tissues was performed in the area of clinical concern. COMPARISON:  None available FINDINGS: Targeted sonographic evaluation of the area of concern in the LEFT submandibular region demonstrates an oval heterogeneous, predominantly hypoechoic mass measuring 3.4 x 1.9 x 3.1 cm. IMPRESSION: 3.4 x 1.9 x 3.1 cm LEFT submandibular mass is suspicious for necrotic lymph node. Further evaluation with contrast enhanced neck CT recommended. Electronically Signed   By: Aliene Lloyd M.D.   On: 01/03/2024 10:40      IMPRESSION/PLAN:  This is a delightful patient with head and neck cancer. I *** recommend radiotherapy for this patient.  We discussed the potential risks, benefits, and side effects of radiotherapy. We talked in detail about acute and late effects. We discussed that some of the most bothersome acute effects may be mucositis, dysgeusia, salivary changes, skin irritation, hair loss, dehydration, weight loss and fatigue. We talked about late effects which include but are not necessarily limited to dysphagia, hypothyroidism, nerve injury, vascular injury, spinal cord injury, xerostomia, trismus, neck edema, dental issues, non-healing wound, and potentially fatal injury to any of the tissues in the head and neck region. No guarantees of treatment were given. A consent form was signed and placed in the patient's medical record. The patient is enthusiastic about proceeding with treatment. I look forward to participating in the patient's care.    Simulation (treatment planning) will take place ***  We also discussed that the treatment of head and neck cancer is a multidisciplinary process to maximize treatment outcomes and quality of life. For this reason the following referrals have been or will  be made:  *** Medical oncology to discuss chemotherapy   *** Dentistry for dental evaluation, possible extractions in the radiation fields, and /or advice on reducing risk of cavities, osteoradionecrosis, or other oral issues.  *** Nutritionist for nutrition support during and after treatment.  *** Speech language pathology for swallowing and/or speech therapy.  *** Social work for social support.   *** Physical therapy due to risk of lymphedema in neck and deconditioning.  *** Baseline labs including TSH.  On date of service, in total, I spent *** minutes on this encounter. Patient was seen in person.  __________________________________________   Lauraine Golden, MD   This document serves as a record of services personally performed by Lauraine Golden, MD. It was created on her behalf by Reymundo Cartwright, a  trained medical scribe. The creation of this record is based on the scribe's personal observations and the provider's statements to them. This document has been checked and approved by the attending provider.

## 2024-01-28 NOTE — Anesthesia Preprocedure Evaluation (Signed)
 Anesthesia Evaluation  Patient identified by MRN, date of birth, ID band Patient awake    Reviewed: Allergy & Precautions, H&P , NPO status , Patient's Chart, lab work & pertinent test results  Airway Mallampati: II   Neck ROM: full    Dental   Pulmonary neg pulmonary ROS   breath sounds clear to auscultation       Cardiovascular hypertension,  Rhythm:regular Rate:Normal     Neuro/Psych   Anxiety        GI/Hepatic ,GERD  ,,  Endo/Other    Renal/GU      Musculoskeletal   Abdominal   Peds  Hematology   Anesthesia Other Findings   Reproductive/Obstetrics                             Anesthesia Physical Anesthesia Plan  ASA: 2  Anesthesia Plan: General   Post-op Pain Management:    Induction: Intravenous  PONV Risk Score and Plan: 2 and Ondansetron , Dexamethasone , Midazolam  and Treatment may vary due to age or medical condition  Airway Management Planned: Oral ETT  Additional Equipment:   Intra-op Plan:   Post-operative Plan: Extubation in OR  Informed Consent: I have reviewed the patients History and Physical, chart, labs and discussed the procedure including the risks, benefits and alternatives for the proposed anesthesia with the patient or authorized representative who has indicated his/her understanding and acceptance.     Dental advisory given  Plan Discussed with: CRNA, Anesthesiologist and Surgeon  Anesthesia Plan Comments:        Anesthesia Quick Evaluation

## 2024-01-28 NOTE — Anesthesia Procedure Notes (Signed)
 Procedure Name: Intubation Date/Time: 01/28/2024 4:51 PM  Performed by: Lisa Blakeman J, CRNAPre-anesthesia Checklist: Patient identified, Emergency Drugs available, Suction available and Patient being monitored Patient Re-evaluated:Patient Re-evaluated prior to induction Oxygen Delivery Method: Circle System Utilized Preoxygenation: Pre-oxygenation with 100% oxygen Induction Type: IV induction Ventilation: Mask ventilation without difficulty Laryngoscope Size: Glidescope and 4 Grade View: Grade I Tube type: Oral Tube size: 6.0 mm Number of attempts: 1 Airway Equipment and Method: Stylet and Oral airway Placement Confirmation: ETT inserted through vocal cords under direct vision, positive ETCO2 and breath sounds checked- equal and bilateral Secured at: 23 cm Tube secured with: Tape Dental Injury: Teeth and Oropharynx as per pre-operative assessment

## 2024-01-29 ENCOUNTER — Ambulatory Visit
Admission: RE | Admit: 2024-01-29 | Discharge: 2024-01-29 | Disposition: A | Discharge status: Home / Self Care | Source: Ambulatory Visit | Attending: Radiation Oncology | Admitting: Radiation Oncology

## 2024-01-29 ENCOUNTER — Encounter: Payer: Self-pay | Admitting: Radiation Oncology

## 2024-01-29 ENCOUNTER — Other Ambulatory Visit: Payer: Self-pay

## 2024-01-29 VITALS — BP 155/90 | HR 107 | Temp 97.8°F | Resp 18 | Ht 69.0 in | Wt 217.2 lb

## 2024-01-29 DIAGNOSIS — K219 Gastro-esophageal reflux disease without esophagitis: Secondary | ICD-10-CM | POA: Insufficient documentation

## 2024-01-29 DIAGNOSIS — R000 Tachycardia, unspecified: Secondary | ICD-10-CM | POA: Insufficient documentation

## 2024-01-29 DIAGNOSIS — C4442 Squamous cell carcinoma of skin of scalp and neck: Secondary | ICD-10-CM

## 2024-01-29 DIAGNOSIS — Z806 Family history of leukemia: Secondary | ICD-10-CM | POA: Insufficient documentation

## 2024-01-29 DIAGNOSIS — C770 Secondary and unspecified malignant neoplasm of lymph nodes of head, face and neck: Secondary | ICD-10-CM | POA: Insufficient documentation

## 2024-01-29 DIAGNOSIS — Z801 Family history of malignant neoplasm of trachea, bronchus and lung: Secondary | ICD-10-CM | POA: Diagnosis not present

## 2024-01-29 DIAGNOSIS — I10 Essential (primary) hypertension: Secondary | ICD-10-CM | POA: Diagnosis not present

## 2024-01-29 DIAGNOSIS — R011 Cardiac murmur, unspecified: Secondary | ICD-10-CM | POA: Diagnosis not present

## 2024-01-29 DIAGNOSIS — Z79899 Other long term (current) drug therapy: Secondary | ICD-10-CM | POA: Diagnosis not present

## 2024-01-29 NOTE — Anesthesia Postprocedure Evaluation (Signed)
 Anesthesia Post Note  Patient: Charlie Perry  Procedure(s) Performed: LARYNGOSCOPY, DIRECT WITH BIOPSIES     Patient location during evaluation: PACU Anesthesia Type: General Level of consciousness: awake and alert Pain management: pain level controlled Vital Signs Assessment: post-procedure vital signs reviewed and stable Respiratory status: spontaneous breathing, nonlabored ventilation, respiratory function stable and patient connected to nasal cannula oxygen Cardiovascular status: blood pressure returned to baseline and stable Postop Assessment: no apparent nausea or vomiting Anesthetic complications: no   No notable events documented.  Last Vitals:  Vitals:   01/28/24 1800 01/28/24 1815  BP: (!) 134/94 133/88  Pulse: 89 91  Resp: 18 20  Temp:  (!) 36.4 C  SpO2: 92% 97%    Last Pain:  Vitals:   01/28/24 1749  TempSrc:   PainSc: 9                  Smith Potenza

## 2024-01-29 NOTE — Progress Notes (Signed)
 Dental Form with Estimates of Radiation Dose  Douglas Hester 28-Dec-1960     Diagnosis:    ICD-10-CM   1. Squamous cell carcinoma of head and neck  C44.42     2. 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 TBD   Anticipated xerostomia:  Mild permanent   Pre-simulation needs:  dental clearance  Simulation: approx July 16  Other Notes:   Please contact Lauraine Golden, MD, with patient's disposition after evaluation and/or dental treatment.

## 2024-01-29 NOTE — Progress Notes (Signed)
 Oncology Nurse Navigator Documentation   Met with patient during initial consult with Dr. Izell. He was accompanied by his brother - in - law. Further introduced myself as his/their Navigator, explained my role as a member of the Care Team. Provided New Patient resource guide binder: Contact information for physicians, this navigator, other members of the Care Team Advance Directive information; provided [facility] AD booklet at their request,  Fall Prevention Patient Safety Plan Financial Assistance Information sheet Symptom Management Clinic information [facility] campus map with highlight of [facility] SLP Information sheet Head and Neck cancer basics Nutrition information Patient and family support information including Spiritual care/Chaplain information, Peer mentor program, health and wellness classes, and the survivorship program Community resources  Provided and discussed educational handouts for PEG and PAC. He knows that at [facility] and [facility] will be recommended if he receives chemotherapy.  Assisted with post-consult appt scheduling. I have placed a referral to Dr. Danial with Dental medicine for evaluation before radiation begins.    They verbalized understanding of information provided. I encouraged them to call with questions/concerns moving forward.  [name], RN, BSN, OCN Head & Neck Oncology Nurse Navigator [facility] [place] [phone]

## 2024-01-29 NOTE — Addendum Note (Signed)
 Addendum  created 01/29/24 1752 by Mallory Manus, MD   Clinical Note Signed

## 2024-01-29 NOTE — Progress Notes (Signed)
 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 Experienced a dry cough for a while  Biopsies revealed:    01/03/2024 CT Soft Tissue Neck W Contrast  Nutrition Status Yes No Comments  Weight changes? []  [x]  Weight has stayed stable  Swallowing concerns? [x]  []  Discomfort eating foods  PEG? []  [x]     Referrals Yes No Comments  Social Work? [x]  []    Dentistry? [x]  []    Swallowing therapy? [x]  []    Nutrition? [x]  []    Med/Onc? [x]  []     Safety Issues Yes No Comments  Prior radiation? []  [x]    Pacemaker/ICD? []  [x]    Possible current pregnancy? []  [x]    Is the patient on methotrexate? []  [x]     Tobacco/Marijuana/Snuff/ETOH use:  No history of smoking or drinking.  Past/Anticipated interventions by otolaryngology, if any:  01/29/2024 Tobie, MD Direct Laryngoscopy with Biopsy     Past/Anticipated interventions by medical oncology, if any:  Radiation Therapy    Current Complaints / other details:   None

## 2024-01-29 NOTE — Anesthesia Postprocedure Evaluation (Signed)
 Anesthesia Post Note  Patient: Douglas Hester  Procedure(s) Performed: LARYNGOSCOPY, DIRECT WITH BIOPSIES     Patient location during evaluation: PACU Anesthesia Type: General Level of consciousness: awake and alert Pain management: pain level controlled Vital Signs Assessment: post-procedure vital signs reviewed and stable Respiratory status: spontaneous breathing, nonlabored ventilation, respiratory function stable and patient connected to nasal cannula oxygen Cardiovascular status: blood pressure returned to baseline and stable Postop Assessment: no apparent nausea or vomiting Anesthetic complications: no   No notable events documented.  Last Vitals:  Vitals:   01/28/24 1800 01/28/24 1815  BP: (!) 134/94 133/88  Pulse: 89 91  Resp: 18 20  Temp:  (!) 36.4 C  SpO2: 92% 97%    Last Pain:  Vitals:   01/28/24 1749  TempSrc:   PainSc: 9                  Smith Potenza

## 2024-01-31 LAB — SURGICAL PATHOLOGY

## 2024-02-06 ENCOUNTER — Inpatient Hospital Stay

## 2024-02-06 ENCOUNTER — Inpatient Hospital Stay (HOSPITAL_BASED_OUTPATIENT_CLINIC_OR_DEPARTMENT_OTHER): Admitting: Oncology

## 2024-02-06 ENCOUNTER — Encounter: Payer: Self-pay | Admitting: Oncology

## 2024-02-06 VITALS — BP 118/79 | HR 102 | Temp 97.7°F | Resp 18 | Ht 69.0 in | Wt 214.0 lb

## 2024-02-06 DIAGNOSIS — Z807 Family history of other malignant neoplasms of lymphoid, hematopoietic and related tissues: Secondary | ICD-10-CM | POA: Insufficient documentation

## 2024-02-06 DIAGNOSIS — C099 Malignant neoplasm of tonsil, unspecified: Secondary | ICD-10-CM | POA: Insufficient documentation

## 2024-02-06 DIAGNOSIS — C770 Secondary and unspecified malignant neoplasm of lymph nodes of head, face and neck: Secondary | ICD-10-CM | POA: Insufficient documentation

## 2024-02-06 DIAGNOSIS — Z510 Encounter for antineoplastic radiation therapy: Secondary | ICD-10-CM | POA: Insufficient documentation

## 2024-02-06 DIAGNOSIS — Z801 Family history of malignant neoplasm of trachea, bronchus and lung: Secondary | ICD-10-CM | POA: Insufficient documentation

## 2024-02-06 DIAGNOSIS — C4442 Squamous cell carcinoma of skin of scalp and neck: Secondary | ICD-10-CM | POA: Insufficient documentation

## 2024-02-06 LAB — TSH: TSH: 1.11 u[IU]/mL (ref 0.350–4.500)

## 2024-02-06 NOTE — Assessment & Plan Note (Addendum)
 Please review oncology history for additional details and timeline of events.  PET scan performed on 01/25/24 re-demonstrated the necrotic left-sided level 2 lymph node measuring up to 3 cm and symmetric upper pharyngeal uptake extending to the left side of the vallecular. No additional areas of abnormal radiotracer uptake at this time to suggest additional areas of disease.   Patient underwent a laryngoscopy with lingual tonsil biopsy on 01/28/24 under the care of [name].  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  Right lingual tonsil showed focal surface and crypt high-grade dysplasia.  Negative for invasion.  Left tonsillar fossa biopsies were negative.   cT1,cN1,cM0,p16+, Stage I disease.   In the CT of the neck, lymph node measured 2.6 cm.  On the PET scan, it seemed to be measuring up to 3 cm. Lymph node size approaches 3 cm but does not exceed it. No definite evidence of extra nodal extension or high-risk features, but possibility discussed.  Reviewed NCCN guidelines and discussed management options with the patient.  He is not keen on pursuing chemotherapy concurrently with radiation.  He is agreeable to proceeding with definitive radiation therapy alone at this time.  His case will be discussed in multidisciplinary ENT conference on 02/13/2024 for consensus opinion.  Briefly discussed that if we are proceeding with chemotherapy, it will be given with weekly cisplatin during the course of radiation.  Discussed side effect profile including renal dysfunction, nausea, vomiting, fatigue, ocular disturbances, cytopenias etc.  Patient was not keen on listening regarding chemotherapy.  - Proceed with radiation therapy alone if agreed upon in conference.  - Address concerns about potential dental damage due to radiation; focus radiation to minimize damage, but anticipate some effects on enamel and  saliva glands, increasing long-term cavity risk.  Our nurse navigator [name] will coordinate appointments.

## 2024-02-06 NOTE — Progress Notes (Signed)
 Oncology Nurse Navigator Documentation   Met with patient during initial consult with Dr. Autumn. He was accompanied by his sister and brother-in-law. Further introduced myself as his/their Navigator, explained my role as a member of the Care Team. Assisted with post-consult appt scheduling. They verbalized understanding of information provided. I encouraged them to call with questions/concerns moving forward.  [name], RN, BSN, OCN Head & Neck Oncology Nurse Navigator [facility] at [place] [phone]

## 2024-02-06 NOTE — Progress Notes (Addendum)
 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  ASSESSMENT & PLAN:   Douglas Hester is a 63 y.o. 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   Squamous cell carcinoma of left tonsil [facility]) Please review oncology history for additional details and timeline of events.  PET scan performed on 01/25/24 re-demonstrated the necrotic left-sided level 2 lymph node measuring up to 3 cm and symmetric upper pharyngeal uptake extending to the left side of the vallecular. No additional areas of abnormal radiotracer uptake at this time to suggest additional areas of disease.   Patient underwent a laryngoscopy with lingual tonsil biopsy on 01/28/24 under the care of [name].  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  Right lingual tonsil showed focal surface and crypt high-grade dysplasia.  Negative for invasion.  Left tonsillar fossa biopsies were negative.   cT1,cN1,cM0,p16+, Stage I disease.   In the CT of the neck, lymph node measured 2.6 cm.  On the PET scan, it seemed to be measuring up to 3 cm. Lymph node size approaches 3 cm but does not exceed it. No definite evidence of extra nodal extension or high-risk features, but  possibility discussed.  Reviewed NCCN guidelines and discussed management options with the patient.  He is not keen on pursuing chemotherapy concurrently with radiation.  He is agreeable to proceeding with definitive radiation therapy alone at this time.  His case will be discussed in multidisciplinary ENT conference on 02/13/2024 for consensus opinion.  Briefly discussed that if we are proceeding with chemotherapy, it will be given with weekly cisplatin during the course of radiation.  Discussed side effect profile including renal dysfunction, nausea, vomiting, fatigue, ocular disturbances, cytopenias etc.  Patient was not keen on listening regarding chemotherapy.  - Proceed with radiation therapy alone if agreed upon in conference.  - Address concerns about potential dental damage due to radiation; focus radiation to minimize damage, but anticipate some effects on enamel and saliva glands, increasing long-term cavity risk.  Our nurse navigator [name] will coordinate appointments.   I reviewed lab results and outside records for this visit and discussed relevant results with the patient. Diagnosis, plan of care and treatment options were also discussed in detail with the patient. Opportunity provided to ask questions and answers provided to his apparent satisfaction. Provided instructions to call our clinic with any problems, questions or concerns prior to return visit. I recommended to continue follow-up with PCP and sub-specialists. He verbalized understanding and agreed with the plan. No barriers to learning was detected.  NCCN guidelines have been consulted in the planning of this patient's care.  [name], MD  02/06/2024 5:19 PM  [facility] - A DEPT OF [place]. [facility] [address]   [place] [address] Dept: [phone] Dept Fax: [phone]   HISTORY OF PRESENTING ILLNESS:   I have reviewed his chart  and materials related to his cancer extensively and collaborated history with the patient. Summary of oncologic history is as follows:  ONCOLOGY HISTORY:  He presented to the ED on 01/03/24 with complains of headaches and an enlarged lymph node to his left neck. An Ultrasound was preformed at that time showing a left submandibular mass suspicious for a necrotic lymph node. He then underwent a CT soft tissue neck to further investigate his symptoms. Scans showed a necrotic 2.6 cm left level II mass suspicious for a conglomerate nodal metastasis. CT chest and head were negative for any abnormalities.    Subsequently, the patient was referred to [name] on 01/04/24 who suggested undergoing a core biopsy of the left neck mass to further investigate his symptoms. Patient underwent procedure on 01/14/24. Surgical pathology indicated moderately differentiated squamous cell carcinoma. Immunohistochemical stain for p16 shows strong blocklike positivity, EBER ISH is negative.    Patient underwent direct laryngoscope under the care of [name] on 01/17/2024 which revealed no obvious abnormalities, or signs of disease primary.    PET scan performed on 01/25/24 re-demonstrated the necrotic left-sided level 2 lymph node measuring up to 3 cm and symmetric upper pharyngeal uptake extending to the left side of the vallecular. No additional areas of abnormal radiotracer uptake at this time to suggest additional areas of disease.   Patient underwent a laryngoscopy with lingual tonsil biopsy on 01/28/24 under the care of [name].  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  Right lingual tonsil showed focal surface and crypt high-grade dysplasia.  Negative for invasion.  Left tonsillar fossa biopsies were negative.   cT1,cN1,cM0,p16+, Stage I disease.   In the CT of the neck, lymph node measured 2.6 cm.  On the PET scan, it  seemed to be measuring up to 3 cm.  Reviewed NCCN guidelines and discussed management options with the patient.  He is not keen on pursuing chemotherapy concurrently with radiation.  He is agreeable to proceeding with definitive radiation therapy alone at this time.  His case will be discussed in multidisciplinary ENT conference on 02/13/2024 for consensus opinion.  Oncology History  Squamous cell carcinoma of left tonsil (HCC) (Resolved)  01/28/2024 Initial Diagnosis   Squamous cell carcinoma of left tonsil (HCC)   01/29/2024 Cancer Staging   Staging form: Cervical Lymph Nodes and Unknown Primary Tumors of the Head and Neck, AJCC 8th Edition - Clinical: Stage III (cT0, cN1, cM0) - Signed by Autumn Millman, MD on 02/06/2024   Squamous cell carcinoma of left tonsil (HCC)  02/06/2024 Initial Diagnosis   Squamous cell carcinoma of left tonsil (HCC)   02/06/2024 Cancer Staging   Staging form: Pharynx - HPV-Mediated Oropharynx, AJCC 8th Edition - Clinical: Stage I (cT1, cN1, cM0, p16+) - Signed by Autumn Millman, MD on 02/06/2024     INTERVAL HISTORY:  Discussed the use of AI scribe software for clinical note transcription with the patient, who gave verbal consent to proceed.  History of Present Illness Elyjah Hazan is a 63 year old male with tonsillar cancer who presents for a follow-up on his treatment plan. He is accompanied by his sister Inocente and her husband Ron. He was referred by Dr. Izell for further evaluation and treatment planning.  The size of the lymph node has increased from  2.5 to 3 centimeters, as noted in his PET scan results. He has undergone a biopsy of the tonsil, which confirmed the diagnosis of tonsillar cancer.  He is concerned about the potential side effects of radiation therapy, particularly regarding dental health. His dentist informed him that radiation could affect the enamel and saliva glands, leading to an increased risk of cavities. He is worried about the  long-term impact on his teeth and inquires about protective measures such as a mouth guard.  He experiences significant pain following the biopsy procedure, which made it difficult for him to eat anything other than applesauce. He describes the pain as worse than when he had his tonsils removed at age 5. Although the pain has improved, he still experiences tenderness when eating. Opening his mouth is still somewhat painful, though it is healing.  He has a family history of cancer, having lost both parents to the disease. He is familiar with the side effects of chemotherapy and radiation from his parents' experiences.    MEDICAL HISTORY:  Past Medical History:  Diagnosis Date   Anxiety    Chronic GERD    Heart murmur    Hypertension    Tachycardia     SURGICAL HISTORY: Past Surgical History:  Procedure Laterality Date   CHOLECYSTECTOMY N/A 10/11/2018   Procedure: LAPAROSCOPIC CHOLECYSTECTOMY WITH INTRAOPERATIVE CHOLANGIOGRAM;  Surgeon: Curvin Deward MOULD, MD;  Location: [facility];  Service: General;  Laterality: N/A;   COLONOSCOPY  ~ 2014   done by GI in [place]. Polyps were removed. Patient not aware what type of polyps these were   DIRECT LARYNGOSCOPY N/A 01/28/2024   Procedure: LARYNGOSCOPY, DIRECT WITH BIOPSIES;  Surgeon: Tobie Eldora NOVAK, MD;  Location: [facility];  Service: ENT;  Laterality: N/A;   ESOPHAGOGASTRODUODENOSCOPY  early 1990s   MD found ulcer   INGUINAL HERNIA REPAIR Left ~ 2010   with Mesh.    VASECTOMY      SOCIAL HISTORY: Social History   Socioeconomic History   Marital status: Divorced    Spouse name: Not on file   Number of children: 0   Years of education: Not on file   Highest education level: Not on file  Occupational History   Not on file  Tobacco Use   Smoking status: Never   Smokeless tobacco: Never  Vaping Use   Vaping status: Never Used  Substance and Sexual Activity   Alcohol use: Yes    Comment: rare   Drug use: No   Sexual activity: Not on  file  Other Topics Concern   Not on file  Social History Narrative   Not on file   Social Drivers of Health   Financial Resource Strain: Not on file  Food Insecurity: No Food Insecurity (02/06/2024)   Hunger Vital Sign    Worried About Running Out of Food in the Last Year: Never true    Ran Out of Food in the Last Year: Never true  Transportation Needs: No Transportation Needs (02/06/2024)   PRAPARE - Administrator, Civil Service (Medical): No    Lack of Transportation (Non-Medical): No  Physical Activity: Not on file  Stress: Not on file  Social Connections: Not on file  Intimate Partner Violence: Not At Risk (02/06/2024)   Humiliation, Afraid, Rape, and Kick questionnaire    Fear of Current or Ex-Partner: No    Emotionally Abused: No    Physically Abused: No    Sexually Abused: No    FAMILY HISTORY:  Family History  Problem Relation Age of Onset   Lung cancer Mother    Lymphoma Father    Valvular heart disease Sister     ALLERGIES:  He has no known allergies.  MEDICATIONS:  Current Outpatient Medications  Medication Sig Dispense Refill   acetaminophen  (TYLENOL ) 500 MG tablet Take 500-1,000 mg by mouth 2 (two) times daily as needed (pain.).     buPROPion  (WELLBUTRIN  XL) 150 MG 24 hr tablet Take 1 tablet (150 mg total) by mouth daily. Bridge to patient appt 12/16 (Patient taking differently: Take 150 mg by mouth every evening. Bridge to patient appt 12/16) 90 tablet 0   lamoTRIgine  (LAMICTAL ) 150 MG tablet Take 1 tablet (150 mg total) by mouth daily. (Patient taking differently: Take 150 mg by mouth every evening.) 30 tablet 2   Melatonin 5 MG TABS Take 5 mg by mouth at bedtime as needed (sleep).     metoprolol  succinate (TOPROL -XL) 25 MG 24 hr tablet Take 25 mg by mouth every evening.     mirtazapine  (REMERON ) 15 MG tablet Take 1 tablet (15 mg total) by mouth as needed (for sleep). 90 tablet 0   olmesartan (BENICAR) 5 MG tablet Take 5 mg by mouth every evening.      No current facility-administered medications for this visit.    REVIEW OF SYSTEMS:    Review of Systems - Oncology  All other pertinent systems were reviewed with the patient and are negative.  PHYSICAL EXAMINATION:   Onc Performance Status - 02/06/24 1320       ECOG Perf Status   ECOG Perf Status Restricted in physically strenuous activity but ambulatory and able to carry out work of a light or sedentary nature, e.g., light house work, office work      KPS SCALE   KPS % SCORE Normal activity with effort, some s/s of disease          Vitals:   02/06/24 1319  BP: 118/79  Pulse: (!) 102  Resp: 18  Temp: 97.7 F (36.5 C)  SpO2: 97%   Filed Weights   02/06/24 1319  Weight: 214 lb (97.1 kg)    Physical Exam Constitutional:      General: He is not in acute distress.    Appearance: Normal appearance.  HENT:     Head: Normocephalic and atraumatic.  Eyes:     Conjunctiva/sclera: Conjunctivae normal.  Cardiovascular:     Rate and Rhythm: Normal rate and regular rhythm.  Pulmonary:     Effort: Pulmonary effort is normal. No respiratory distress.  Abdominal:     General: There is no distension.  Lymphadenopathy:     Cervical: Cervical adenopathy (left sided level 2 LN palpable. ~ 2-3 cm in size) present.  Neurological:     General: No focal deficit present.     Mental Status: He is alert and oriented to person, place, and time.  Psychiatric:        Mood and Affect: Mood normal.        Behavior: Behavior normal.       LABORATORY DATA:   I have reviewed the data as listed.  No results found for any visits on 02/06/24.  Lab Results  Component Value Date   WBC 6.5 01/23/2024   HGB 14.4 01/23/2024   HCT 44.6 01/23/2024   MCV 89.4 01/23/2024   PLT 262 01/23/2024   Recent Labs    01/03/24 1623 01/23/24 1200  NA 140 140  K 4.8 4.4  CL 104 105  CO2 27 28  GLUCOSE 92 95  BUN 13 7*  CREATININE 0.95 0.92  CALCIUM 9.5 9.1  GFRNONAA >60 >60  PROT  7.3  --   ALBUMIN 4.3  --   AST 21  --   ALT 35  --   ALKPHOS 90  --   BILITOT 0.3  --     RADIOGRAPHIC STUDIES:  I have personally reviewed the radiological images as listed and agree with the findings in the report.  NM PET Image Initial (PI) Skull Base To Thigh (F-18 FDG) Result Date: 01/25/2024 CLINICAL DATA:  Initial treatment strategy for head neck squamous cell carcinoma. EXAM: NUCLEAR MEDICINE PET SKULL BASE TO THIGH TECHNIQUE: 11.5 mCi F-18 FDG was injected intravenously. Full-ring PET imaging was performed from the skull base to thigh after the radiotracer. CT data was obtained and used for attenuation correction and anatomic localization. Fasting blood glucose: 87 mg/dl COMPARISON:  CT scan head, neck and chest 01/03/2024. FINDINGS: Mediastinal blood pool activity: SUV max 2.3 Liver activity: SUV max 3.0 NECK: On the prior CT scan there is a necrotic left-sided level 2 lymph node measuring 2.6 cm. Today this measures 3.0 cm on image 33 of series 4 of the CT scan. There is rim like marginal uptake greatest lateral with maximum SUV 9.1 consistent with a malignant lymph node. There are no additional areas of abnormal nodal uptake in the neck including submandibular or posterior triangle. There is some near symmetric upper pharyngeal uptake extending to the left side of the vallecular. Please correlate with clinical findings and recent laryngoscopy. Incidental CT findings: Please correlate with recent contrast neck CT scan. Grossly the paranasal sinuses and mastoid air cells are clear. The parotid glands, submandibular glands and thyroid  gland are preserved. CHEST: No specific abnormal uptake above blood pool in the axillary regions, hilum or mediastinum. No abnormal lung uptake. Incidental CT findings: Relate with recent chest CT scan with contrast. Heart is nonenlarged. Mild coronary artery calcifications are seen. Patulous esophagus. The thoracic aorta has a normal course and caliber. There is  a bovine type aortic arch, normal variant. Breathing motion. Mild dependent atelectasis. No consolidation, pneumothorax or effusion. ABDOMEN/PELVIS: There is physiologic distribution radiotracer along the parenchymal organs, bowel and renal collecting systems. No specific abnormal nodal uptake. Incidental CT findings: On this limited noncontrast CT examination, grossly the liver, spleen, adrenal glands are unremarkable. Previous cholecystectomy. Mild parenchymal atrophy globally of the pancreas. No abnormal calcifications seen within either kidney nor along the course of either ureter. Slight wall thickening of the underdistended urinary bladder. The large bowel has a normal course and caliber. Scattered colonic stool. Left-sided colonic diverticula. Normal appendix. The stomach and small bowel are nondilated. Mild vascular calcifications are identified. SKELETON: No abnormal uptake along the visualized osseous structures. Incidental CT findings: Scattered degenerative changes. IMPRESSION: Left-sided level 2 necrotic lymph node identified once again has some marginal abnormal uptake consistent with known history of neoplasm. No additional areas of abnormal radiotracer uptake at this time to suggest additional areas of disease. Electronically Signed   By: Ranell Bring M.D.   On: 01/25/2024 15:55   US  CORE BIOPSY (LYMPH NODES) Result Date: 01/14/2024 Jenna Cordella LABOR, MD     01/14/2024  1:40 PM Interventional Radiology Procedure Note Procedure: U/S Guided Biopsy and aspirationof left neck mass Complications: None Estimated Blood Loss: < 10 mL Findings: Right neck mass possibly infected or necrotic.  CNB and aspiraton 3mL.  One core samples obtained  and sent to Pathology.  Fluid sent for cultures. Cordella DELENA Banner, MD    Orders Placed This Encounter  Procedures   TSH    Standing Status:   Future    Number of Occurrences:   1    Expiration Date:   02/05/2025    CODE STATUS:  Code Status History     Date  Active Date Inactive Code Status Order ID Comments User Context   10/11/2018 1716 10/12/2018 1528 Full Code [id number]  Curvin Deward MOULD, MD Inpatient   04/29/2017 2357 05/01/2017 2012 Full Code [id number]  Luke Agent, MD Inpatient       Future Appointments  Date Time Provider Department Center  02/07/2024  8:00 AM Tobie Comp B, MD [facility] None  02/13/2024  1:15 PM [facility] NURSE [facility] None  02/13/2024  2:00 PM Izell Domino, MD [facility] None  04/14/2024  8:40 AM Arfeen, Leni DASEN, MD [facility] None     I spent a total of 50 minutes during this encounter with the patient including review of chart and various tests results, discussions about plan of care and coordination of care plan.  This document was completed utilizing speech recognition software. Grammatical errors, random word insertions, pronoun errors, and incomplete sentences are an occasional consequence of this system due to software limitations, ambient noise, and hardware issues. Any formal questions or concerns about the content, text or information contained within the body of this dictation should be directly addressed to the provider for clarification.

## 2024-02-07 ENCOUNTER — Encounter (INDEPENDENT_AMBULATORY_CARE_PROVIDER_SITE_OTHER): Payer: Self-pay

## 2024-02-07 ENCOUNTER — Ambulatory Visit (INDEPENDENT_AMBULATORY_CARE_PROVIDER_SITE_OTHER): Admitting: Otolaryngology

## 2024-02-07 ENCOUNTER — Encounter (INDEPENDENT_AMBULATORY_CARE_PROVIDER_SITE_OTHER): Payer: Self-pay | Admitting: Otolaryngology

## 2024-02-07 ENCOUNTER — Telehealth (INDEPENDENT_AMBULATORY_CARE_PROVIDER_SITE_OTHER): Payer: Self-pay | Admitting: Otolaryngology

## 2024-02-07 VITALS — BP 113/76 | HR 88

## 2024-02-07 DIAGNOSIS — C109 Malignant neoplasm of oropharynx, unspecified: Secondary | ICD-10-CM

## 2024-02-07 NOTE — Progress Notes (Signed)
 Dear Dr. Katina, Here is my assessment for our mutual patient, Kleber Crean. Thank you for allowing me the opportunity to care for your patient. Please do not hesitate to contact me should you have any other questions. Sincerely, Dr. Eldora Blanch  Otolaryngology Clinic Note Referring provider: Dr. Katina HPI:  Douglas Hester is a 63 y.o. male kindly referred by Dr. Katina for evaluation of left neck mass.  Initial visit (12/2023): Patient reports: he had a URI on Mother's day weekend and got antibiotics which helped some. But about 2 weeks later, he noted a growth on his left neck - some tenderness, no fevers. Did not resolve so went to ED where CT was done. Associated symptoms include some headache, Bilateral ear discomfort (left some worse than right), and subjective fever, malaise and some night sweats (chest mostly, but not soaking wet). No sick contacts. Patient otherwise denies: - dysphagia, odynophagia, unintentional weight loss - changes in voice, shortness of breath, hemoptysis  - Denies tobacco use or alcohol use (used to, 4-5 beers on weekend but quit for past 4 years)  --------------------------------------------------------- 01/17/2024 Seen in follow up. 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 He reports neck pain from the biopsy, which is persistent. Swelling improving. No fevers. We had a long discussion regarding his diagnosis and next steps. Interestingly, no lymphoid tissue present in biopsy.  --------------------------------------------------------- 02/07/2024 Returns for follow up. He is doing well overall, with expected post op pain which hsa resolved. No issues with the neck. We did discuss his biopsy in depth. I also chatted with Dr. Izell prior about his plan.  H&N Surgery: Tonsillectomy (as a child) Personal or FHx of bleeding dz or anesthesia difficulty: no  GLP-1: no AP/AC: no  Tobacco: no  PMHx: HTN, Insomnia,  Anxiety  Independent Review of Additional Tests or Records:  CT Neck 01/03/2024 independently interpreted: large left jugulogastric node, internal cystic component; no significant stranding; left carotid medialized and this does appear to cause some asymmetry left OP and lingual tonsil prominence; noted slight left GT sulcus and hypopharynx asymmetry; no other concerning nodes noted. PET/CT 01/25/2024: left level 2 adenopathy with hyperavidity; noted relatively symmetric uptake over tonsillar fossa and lingual tonsils b/l; no other significant areas of uptake identified. CT Chest 01/03/2024: no large pulmonary nodules noted Path (01/14/2024):   Path (01/28/2024):   PMH/Meds/All/SocHx/FamHx/ROS:   Past Medical History:  Diagnosis Date   Anxiety    Chronic GERD    Heart murmur    Hypertension    Tachycardia      Past Surgical History:  Procedure Laterality Date   CHOLECYSTECTOMY N/A 10/11/2018   Procedure: LAPAROSCOPIC CHOLECYSTECTOMY WITH INTRAOPERATIVE CHOLANGIOGRAM;  Surgeon: Curvin Deward MOULD, MD;  Location: [facility];  Service: General;  Laterality: N/A;   COLONOSCOPY  ~ 2014   done by GI in [place]. Polyps were removed. Patient not aware what type of polyps these were   DIRECT LARYNGOSCOPY N/A 01/28/2024   Procedure: LARYNGOSCOPY, DIRECT WITH BIOPSIES;  Surgeon: Blanch Eldora NOVAK, MD;  Location: [facility] OR;  Service: ENT;  Laterality: N/A;   ESOPHAGOGASTRODUODENOSCOPY  early 1990s   MD found ulcer   INGUINAL HERNIA REPAIR Left ~ 2010   with Mesh.    VASECTOMY      Family History  Problem Relation Age of Onset   Lung cancer Mother    Lymphoma Father    Valvular heart disease Sister      Social Connections: Not on file  Current Outpatient Medications:    acetaminophen  (TYLENOL ) 500 MG tablet, Take 500-1,000 mg by mouth 2 (two) times daily as needed (pain.)., Disp: , Rfl:    buPROPion  (WELLBUTRIN  XL) 150 MG 24 hr tablet, Take 1 tablet (150 mg total) by mouth daily. Bridge to  patient appt 12/16 (Patient taking differently: Take 150 mg by mouth every evening. Bridge to patient appt 12/16), Disp: 90 tablet, Rfl: 0   lamoTRIgine  (LAMICTAL ) 150 MG tablet, Take 1 tablet (150 mg total) by mouth daily. (Patient taking differently: Take 150 mg by mouth every evening.), Disp: 30 tablet, Rfl: 2   Melatonin 5 MG TABS, Take 5 mg by mouth at bedtime as needed (sleep)., Disp: , Rfl:    metoprolol  succinate (TOPROL -XL) 25 MG 24 hr tablet, Take 25 mg by mouth every evening., Disp: , Rfl:    mirtazapine  (REMERON ) 15 MG tablet, Take 1 tablet (15 mg total) by mouth as needed (for sleep)., Disp: 90 tablet, Rfl: 0   olmesartan (BENICAR) 5 MG tablet, Take 5 mg by mouth every evening., Disp: , Rfl:    Physical Exam:   BP 113/76   Pulse 88   SpO2 96%   Salient findings:  CN II-XII intact Bilateral EAC clear and TM intact with well pneumatized middle ear spaces No lesions of oral cavity/oropharynx; dentition fair; strong gag No obviously palpable neck masses/lymphadenopathy/thyromegaly except left level 2 relatively mobile neck mass, not tender - no overlying skin changes, not fixed. No respiratory distress or stridor; mild throat discomfort so TFL was indicated to better evaluate the proximal airway, given the patient's history and exam findings, and is detailed below.  Seprately Identifiable Procedures:  Prior to initiating any procedures, risks/benefits/alternatives were explained to the patient and verbal consent obtained.  Procedure Note Pre-procedure diagnosis:  Carcinoma of tongue base and lingual tonsil Post-procedure diagnosis: Same Procedure: Transnasal Fiberoptic Laryngoscopy, CPT 31575 - Mod 25 Indication: see above Complications: None apparent EBL: 0 mL  The procedure was undertaken to further evaluate the patient's complaint above, with mirror exam inadequate for appropriate examination due to gag reflex and poor patient tolerance  Procedure:  Patient was identified  as correct patient. Verbal consent was obtained. The nose was sprayed with oxymetazoline  and 4% lidocaine . The The flexible laryngoscope was passed through the nose to view the nasal cavity, pharynx (oropharynx, hypopharynx) and larynx.  The larynx was examined at rest and during multiple phonatory tasks. Documentation was obtained and reviewed with patient. The scope was removed. The patient tolerated the procedure well.  Findings: The nasal cavity and nasopharynx did not reveal any masses or lesions, mucosa appeared to be without obvious lesions. The tongue base, pharyngeal walls, piriform sinuses, vallecula, epiglottis and postcricoid region are normal in appearance EXCEPT left prominent lingual tonsillar tissue, overall biopsy site has healed well; The visualized portion of the subglottis and proximal trachea is widely patent. The vocal folds are mobile bilaterally. There are no lesions on the free edge of the vocal folds nor elsewhere in the larynx worrisome for malignancy.    Electronically signed by: Eldora KATHEE Blanch, MD 02/07/2024 9:48 AMd with patient. The scope was removed. The patient tolerated the procedure well.    Electronically signed by: Eldora KATHEE Blanch, MD 02/07/2024 9:48 AM   Impression & Plans:  Douglas Hester is a 63 y.o. male with:  1. 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 He's already met Dr. Izell and Dr. Autumn We had an extensive discussion re: R/B/A and I offered surgical consultation at [facility] but he did not wish to go this route. He would like to proceed with radiation  As such, unless he changes his mind, will f/u in late Dec 2025. Added on to TB   Thank you for allowing me the opportunity to care for your patient. Please do not hesitate to contact me should you  have any other questions.  Sincerely, Eldora Blanch, MD Otolaryngologist (ENT), [facility] Phone: [phone] Fax: [phone]  02/07/2024, 9:48 AM   I have personally spent 45 minutes involved in face-to-face and non-face-to-face activities for this patient on the day of the visit.  Professional time spent excludes any procedures performed but includes the following activities, in addition to those noted in the documentation: preparing to see the patient (review of outside documentation and results), performing a medically appropriate examination, extensive counseling, documenting in the electronic health record, independently interpreting results (PET).

## 2024-02-07 NOTE — Telephone Encounter (Signed)
 [employer] called to check status of Medical Records Request (Incident # [id number]) sent on 02/04/2024.  Request was faxed to [facility] HIM Dept on 02/06/2024.  Spoke w/ Felicia @ [facility] HIM Dept today and she stated the request was processed on 02/06/2024 and an invoice for $3.75 was sent.  As soon as payment is received, they will release the records. I called and gave them that information.  I also informed them that I will have [name] complete the Medical Request Form and fax it.

## 2024-02-07 NOTE — Telephone Encounter (Signed)
 Called patient and LVM letting patient know there would be a $25 fee to have [name] complete the Medical Request Form and fax it to [employer] Dept.  Also sent patient a MyChart msg.

## 2024-02-08 ENCOUNTER — Telehealth (INDEPENDENT_AMBULATORY_CARE_PROVIDER_SITE_OTHER): Payer: Self-pay | Admitting: Otolaryngology

## 2024-02-08 NOTE — Telephone Encounter (Signed)
 Patient paid Short Term Disability Paperwork Fee of $25 by phone, which later was refunded.  [name] stated that the Medical Request Form needed to be filled out by [name], the patient's Oncologist because she is the Provider that is handling his treatment and can provide the information needed.  The patient was called and made aware that the fee would be refunded.  The patient provided the contact names for the [facility] or Douglas Hester.  I called and spoke with Douglas Hester to let her know that Dr. Golden would need to fill out the Medical Request Form.  She requested I call [employer] and request a packet requesting Medical Records along with the form the Provider needs to complete be sent to [facility] and sent to Fax # [phone].    I called [employer] at Phone: [phone] for Incident # [id number] and relayed the information.  They will send the request to [facility].  I have put Douglas Hester's copy of his Receipt for the Disability Paperwork Fee and the Refund in outgoing mail.

## 2024-02-11 NOTE — Progress Notes (Signed)
 [facility] 02/11/24 No primary care provider on file. Treatment Providers Shawantha Belarus, [facility]  Dental procedures in this visit  . I5089 - PERIO MAINTENANCE (Completed)    Service provider: Shawantha Belarus, RDH    Billing provider: Christopher Cave, DDS    HEALTH HISTORY ? Vitals:  BP Readings from Last 1 Encounters:  02/11/24 107/74    Pulse:  91 Medical history was reviewed and updated. No contraindication to care. Medical History[1] Surgical History[2] Social History   Tobacco Use  . Smoking status: Never    Passive exposure: Never  . Smokeless tobacco: Never  Substance Use Topics  . Alcohol use: Never   Family History[3] Medications Ordered Prior to Encounter[4] Medical Risk Assessment ASA GRADE: ASA 2 - Patient with mild systemic disease with no functional limitations  Subjective: Patient reports  no change to condition since last visit.   Dental Examination Dr. Cave completed an exam and noted the following conditions and/or findings:  Intraoral exam completed and findings: WNL. Extraoral exam completed and findings: WNL.  Periodontal Evaluation Periodontal Charting: Updated full mouth periodontal charting and documented all findings in patient's tooth chart: Yes Localized pocketing. 4 to 6mm pocketing localized BOP noted: generalized Mobility:  Class I Calculus: Moderate supragingival plaque and calculus Color- Pink Consistency: Localized rolled and erthymatous margins Radiographic studies shows moderate generalized horizontal bone loss   Assessment: Patient oral hygiene: Fair Patient's oral hygiene is stayed the same since last visit Periodontal Diagnosis: Moderate periodontitis (greater than 1/3 and less than 50% bone loss) Dental Diagnosis: After thorough clinical and radiographic examination, the patient was advised of the following diagnosis:  Generalized periodontal disease and Healthy hard/soft tissues   Risks and potential  complications: Stressed importance of flossing. Discussed gingivitis versus periodontal diseaser  Informed patient of all periodontal findings. Informed patient of recommended treatment procedure(s) based on findings. Patient confirmed they understood and has no additional questions. Last recare/SRP: one year ago  Plan: Cavitron, handscale, floss, polish with Nupro coarse paste  OHI: Reviewed brushing and flossing technique. Additional notes: Recommended waterpik/electric toothbrush/floss daily  Recommended recall:  4 months NV: perio-maintenance/perio-charting  Treatment Providers Shawantha Belarus, [facility] - [facility] D[facility] [address] [place] [id number] [phone]       [1] Past Medical History: Diagnosis Date  . Acid reflux   . Cancer (CMS/HCC)   . Depression   . Heart murmur   . Hypertension   . 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 GALLBLADDER SURGERY  2020  [3] Family History Problem Relation Name Age of Onset  . Cancer Mother    . Cancer Father    [4] Current Outpatient Medications on File Prior to Visit  Medication Sig Dispense Refill  . acetaminophen  (TYLENOL ) 500 mg tablet Take 500-1,000 mg by mouth.    . buPROPion  XL (WELLBUTRIN  XL) 150 mg 24 hr tablet Take 150 mg by mouth.    SABRA ibuprofen (ADVIL,MOTRIN) 200 mg tablet Take 200-600 mg by mouth.    . lamoTRIgine  (LaMICtal ) 150 mg tablet Take 150 mg by mouth.    . melatonin 5 mg tablet Take 5 mg by mouth.    . metoprolol  succinate (TOPROL -XL) 25 mg 24 hr tablet take 1 tablet by mouth every day for 90 days    . metoprolol  succinate (TOPROL -XL) 25 mg 24 hr tablet 1 (one) time each day at  the same time.    . mirtazapine  (REMERON ) 15 mg tablet Take 15 mg by mouth.    . olmesartan (BENICAR) 5 mg tablet Take 5 mg by mouth.     No current facility-administered medications on file prior to  visit.

## 2024-02-13 ENCOUNTER — Ambulatory Visit
Admission: RE | Admit: 2024-02-13 | Discharge: 2024-02-13 | Disposition: A | Discharge status: Home / Self Care | Source: Ambulatory Visit | Attending: Radiation Oncology | Admitting: Radiation Oncology

## 2024-02-13 ENCOUNTER — Telehealth: Payer: Self-pay

## 2024-02-13 ENCOUNTER — Other Ambulatory Visit: Payer: Self-pay | Admitting: Radiology

## 2024-02-13 VITALS — BP 137/92 | HR 88 | Temp 97.8°F | Resp 18 | Ht 69.0 in | Wt 215.8 lb

## 2024-02-13 DIAGNOSIS — C099 Malignant neoplasm of tonsil, unspecified: Secondary | ICD-10-CM

## 2024-02-13 DIAGNOSIS — Z510 Encounter for antineoplastic radiation therapy: Secondary | ICD-10-CM | POA: Diagnosis not present

## 2024-02-13 DIAGNOSIS — F419 Anxiety disorder, unspecified: Secondary | ICD-10-CM

## 2024-02-13 MED ORDER — LORAZEPAM 0.5 MG PO TABS: 0.5000 mg | ORAL_TABLET | ORAL | 0 refills | Status: DC | PRN

## 2024-02-13 MED ORDER — LORAZEPAM 0.5 MG PO TABS
0.5000 mg | ORAL_TABLET | ORAL | 0 refills | Status: AC | PRN
Start: 2024-02-13 — End: ?

## 2024-02-13 NOTE — Telephone Encounter (Signed)
 Pt came in today to inquire about the status of his FMLA forms. They were completed . I was able to make a copy to be scan in his chart and gave him the originals. No questions or concerns to be noted at this time.

## 2024-02-13 NOTE — Progress Notes (Signed)
 Patient reports significant anxiety with H/N mask. Prescription for ativan  sent to his pharmacy today. Encouraged patient to take medication 20 minutes prior to treatment and have a driver.     Leeroy Due, PA-C

## 2024-02-13 NOTE — Progress Notes (Signed)
 Has armband been applied?  Yes.    Does patient have an allergy to IV contrast dye?: No.   Has patient ever received premedication for IV contrast dye?: No.   Date of lab work: January 23, 2024 BUN: 7 CR: 0.92 eGFR: >60   IV site: antecubital left, condition patent and no redness  Has IV site been added to flowsheet?  Yes.    BP (!) 137/92 (BP Location: Right Arm, Patient Position: Sitting, Cuff Size: Large)   Pulse 88   Temp 97.8 F (36.6 C)   Resp 18   Ht 5' 9 (1.753 m)   Wt 215 lb 12.8 oz (97.9 kg)   SpO2 100%   BMI 31.87 kg/m

## 2024-02-18 ENCOUNTER — Other Ambulatory Visit: Payer: Self-pay | Admitting: *Deleted

## 2024-02-18 NOTE — Progress Notes (Signed)
 The proposed treatment discussed in conference is for discussion purpose only and is not a binding recommendation.  The patients have not been physically examined, or presented with their treatment options.  Therefore, final treatment plans cannot be decided.

## 2024-02-19 ENCOUNTER — Other Ambulatory Visit: Payer: Self-pay

## 2024-02-19 DIAGNOSIS — Z510 Encounter for antineoplastic radiation therapy: Secondary | ICD-10-CM | POA: Diagnosis not present

## 2024-02-19 DIAGNOSIS — C099 Malignant neoplasm of tonsil, unspecified: Secondary | ICD-10-CM

## 2024-02-20 ENCOUNTER — Other Ambulatory Visit: Payer: Self-pay

## 2024-02-20 ENCOUNTER — Ambulatory Visit
Admission: RE | Admit: 2024-02-20 | Discharge: 2024-02-20 | Disposition: A | Discharge status: Home / Self Care | Source: Ambulatory Visit | Attending: Radiation Oncology | Admitting: Radiation Oncology

## 2024-02-20 DIAGNOSIS — Z510 Encounter for antineoplastic radiation therapy: Secondary | ICD-10-CM | POA: Diagnosis not present

## 2024-02-20 LAB — RAD ONC ARIA SESSION SUMMARY
Course Elapsed Days: 0
Plan Fractions Treated to Date: 1
Plan Prescribed Dose Per Fraction: 2 Gy
Plan Total Fractions Prescribed: 35
Plan Total Prescribed Dose: 70 Gy
Reference Point Dosage Given to Date: 2 Gy
Reference Point Session Dosage Given: 2 Gy
Session Number: 1

## 2024-02-21 ENCOUNTER — Other Ambulatory Visit: Payer: Self-pay

## 2024-02-21 ENCOUNTER — Ambulatory Visit
Admission: RE | Admit: 2024-02-21 | Discharge: 2024-02-21 | Disposition: A | Discharge status: Home / Self Care | Source: Ambulatory Visit | Attending: Radiation Oncology

## 2024-02-21 ENCOUNTER — Inpatient Hospital Stay

## 2024-02-21 DIAGNOSIS — Z510 Encounter for antineoplastic radiation therapy: Secondary | ICD-10-CM | POA: Diagnosis not present

## 2024-02-21 LAB — RAD ONC ARIA SESSION SUMMARY
Course Elapsed Days: 1
Plan Fractions Treated to Date: 2
Plan Prescribed Dose Per Fraction: 2 Gy
Plan Total Fractions Prescribed: 35
Plan Total Prescribed Dose: 70 Gy
Reference Point Dosage Given to Date: 4 Gy
Reference Point Session Dosage Given: 2 Gy
Session Number: 2

## 2024-02-21 NOTE — Progress Notes (Signed)
 [facility] Clinical Social Work  Initial Assessment   Douglas Hester is a 63 y.o. year old male contacted by phone. Clinical Social Work was referred by nurse for assessment of psychosocial needs (new patient assessment)   SDOH (Social Determinants of Health) assessments performed: Yes SDOH Interventions    Flowsheet Row Office Visit from 02/06/2024 in [facility] A Dept Of [place]  SDOH Interventions   Food Insecurity Interventions Intervention Not Indicated  Housing Interventions Intervention Not Indicated  Transportation Interventions Intervention Not Indicated  Utilities Interventions Intervention Not Indicated    SDOH Screenings   Food Insecurity: No Food Insecurity (02/21/2024)  Housing: Low Risk  (02/21/2024)  Transportation Needs: No Transportation Needs (02/06/2024)  Utilities: Not At Risk (02/21/2024)  Depression (PHQ2-9): Low Risk  (02/21/2024)  Tobacco Use: Low Risk  (02/07/2024)     Distress Screen completed: No     No data to display            Family/Social Information:  Housing Arrangement: Patient lives solo in [place], [place].  Family members/support persons in your life? Patient expressed gratitude for the members of his support system (neighbors,friends,family, and church members). Patient stated he has been told numerous times to call if he needs anything. Patient's support system is supporting him by transporting to appointments and arranging a food train. Transportation concerns: Patient denied any concerns about transportation. Again, patient's neighbor arranged a schedule for transportation to appointments. Patient will be transported by support system each appointment.  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 Patient is currently on FMLA / STD until treatment is completed. Patient described a supportive work environment and wants to ensure his symptoms is managed before returning to work.    Income source:  Short-Term Disability and Savings.  Financial concerns: Patient denied any immediate concerns about financial stability while he is going through treatment.  Type of concern: None Food access concerns: no, patient denied any concerns with accessing food while he is in treatment. Religious or spiritual practice: Yes-patient is [place]. Upon discussion, Patient expressed how his faith serves an important role with adjusting to the diagnosis. Patient's faith allows him to feel at peace and be okay with going through treatment.  No areas of spiritual distress noted. Advanced directives: No Services Currently in place:  Family, transportation, income, insurance  Coping/ Adjustment to diagnosis: Patient understands treatment plan and what happens next? yes, patient understands the goal, prognosis, and the course of treatment. Patient reported having a large amount of trust in his providers and understands the potential side effects.  Concerns about diagnosis and/or treatment: The potential side effects of treatment.  Patient reported stressors: No reported stressors. Hopes and/or priorities: For treatment to be successful Patient enjoys time with family/ friends Current coping skills/ strengths: Ability for insight , Active sense of humor , Average or above average intelligence , Capable of independent living , Communication skills , Financial means , General fund of knowledge , Motivation for treatment/growth , Religious Affiliation , and Supportive family/friends     SUMMARY: Current SDOH Barriers:  No SDOH barriers identified during assessment.  Clinical Social Work Clinical Goal(s):  No clinical social work goals at this time  Interventions: Discussed common feeling and emotions when being diagnosed with cancer, and the importance of support during treatment Informed patient of the support team roles and support services at [facility] Provided CSW contact information and encouraged patient to  call with any questions  or concerns  Follow Up Plan: CSW will follow-up with patient by phone  Patient verbalizes understanding of plan: Yes  Lizbeth Sprague, LCSW Clinical Social Worker [facility] [phone]

## 2024-02-22 ENCOUNTER — Telehealth: Payer: Self-pay | Admitting: Dietician

## 2024-02-22 ENCOUNTER — Other Ambulatory Visit: Payer: Self-pay

## 2024-02-22 ENCOUNTER — Ambulatory Visit
Admission: RE | Admit: 2024-02-22 | Discharge: 2024-02-22 | Disposition: A | Discharge status: Home / Self Care | Source: Ambulatory Visit | Attending: Radiation Oncology | Admitting: Radiation Oncology

## 2024-02-22 ENCOUNTER — Inpatient Hospital Stay: Admitting: Dietician

## 2024-02-22 DIAGNOSIS — Z510 Encounter for antineoplastic radiation therapy: Secondary | ICD-10-CM | POA: Diagnosis not present

## 2024-02-22 LAB — RAD ONC ARIA SESSION SUMMARY
Course Elapsed Days: 2
Plan Fractions Treated to Date: 3
Plan Prescribed Dose Per Fraction: 2 Gy
Plan Total Fractions Prescribed: 35
Plan Total Prescribed Dose: 70 Gy
Reference Point Dosage Given to Date: 6 Gy
Reference Point Session Dosage Given: 2 Gy
Session Number: 3

## 2024-02-22 NOTE — Telephone Encounter (Signed)
 Nutrition Assessment   Reason for Assessment: HNC   ASSESSMENT: 63 year old male with SCC of oropharynx, p16+. He is receiving radiation therapy under the care of Dr. Izell. First RT 7/23  Past medical history includes BPD1, biliary colic, tachycardia, hyperglycemia, HTN  Spoke with patient via telephone. He is doing well today. Denies nutrition impact symptoms at this time. Patient reports he is eating as normal. Has been eating small frequent meals for the last few years. Usually has an egg sandwich (cheese/mayo/mustard) for breakfast. Snacked on caramel popcorn, watermelon, peaches. Went to a friends house last night. Recalls eating fresh vegetables dipped in ranch. Patient reports drinking 4+ bottles of water.    Nutrition Focused Physical Exam: deferred (telephone visit)  Medications: wellbutrin , lamictal , ativan , melatonin, toprol , remeron , benicar   Labs: 6/25 - BUN 7   Anthropometrics:   Height: 5'9 Weight: 215 lb 12.8 oz (7/16) UBW: 215-218 lb (2023/2024) BMI: 31.87   NUTRITION DIAGNOSIS: Food and nutrition related knowledge deficit related to cancer and associated treatment side effects as evidenced by no prior need for associated nutrition education    INTERVENTION:  Educated on importance of adequate calorie and protein energy intake to preserve LBM during treatment Continue small frequent meals, educated to include protein foods at every meal - snack ideas, shake recipes, soft moist high protein foods Discussed use of ONS for added calories and protein - samples + coupons Handouts, samples, contact information given to RT - pt to receive at 7/25 treatment  MONITORING, EVALUATION, GOAL: Pt will tolerate increased calories and protein to minimize wt loss during treatment    Next Visit: Friday August 1 via telephone (pt aware)

## 2024-02-25 ENCOUNTER — Telehealth: Payer: Self-pay | Admitting: *Deleted

## 2024-02-25 ENCOUNTER — Ambulatory Visit
Admission: RE | Admit: 2024-02-25 | Discharge: 2024-02-25 | Disposition: A | Discharge status: Home / Self Care | Source: Ambulatory Visit | Attending: Radiation Oncology | Admitting: Radiation Oncology

## 2024-02-25 ENCOUNTER — Other Ambulatory Visit: Payer: Self-pay

## 2024-02-25 DIAGNOSIS — Z510 Encounter for antineoplastic radiation therapy: Secondary | ICD-10-CM | POA: Diagnosis not present

## 2024-02-25 LAB — RAD ONC ARIA SESSION SUMMARY
Course Elapsed Days: 5
Plan Fractions Treated to Date: 4
Plan Prescribed Dose Per Fraction: 2 Gy
Plan Total Fractions Prescribed: 35
Plan Total Prescribed Dose: 70 Gy
Reference Point Dosage Given to Date: 8 Gy
Reference Point Session Dosage Given: 2 Gy
Session Number: 4

## 2024-02-25 NOTE — Telephone Encounter (Signed)
 Douglas Hester in to see this nurse asking about status of Short-term Disability form from [employer].  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  Received a letter from [employer] stating they haven't received anything.  Someone had me sign an authorization form.     Evaluated further while Douglas Hester checked in and received [name]=iation therapy. Nothing in form tracker for this patient since the [employer]-380-E form for FMLA was completed.  See 02/08/2024 form staff member telephone encounter with ENT.  [facility] did not receive a packet from [employer].  ENT faxed request to (SW) H.I.M.  Authorization does not have demographics for [employer].  No H.I.M. releases performed for this patient.     Shared above information to [employer].  Advised to connect with [employer] and request a form be sent to [facility].  Could add attention provider name.  Provided main office fax number [phone]. Denies further questions or needs.

## 2024-02-26 ENCOUNTER — Other Ambulatory Visit: Payer: Self-pay

## 2024-02-26 ENCOUNTER — Ambulatory Visit
Admission: RE | Admit: 2024-02-26 | Discharge: 2024-02-26 | Disposition: A | Discharge status: Home / Self Care | Source: Ambulatory Visit | Attending: Radiation Oncology | Admitting: Radiation Oncology

## 2024-02-26 DIAGNOSIS — Z510 Encounter for antineoplastic radiation therapy: Secondary | ICD-10-CM | POA: Diagnosis not present

## 2024-02-26 LAB — RAD ONC ARIA SESSION SUMMARY
Course Elapsed Days: 6
Plan Fractions Treated to Date: 5
Plan Prescribed Dose Per Fraction: 2 Gy
Plan Total Fractions Prescribed: 35
Plan Total Prescribed Dose: 70 Gy
Reference Point Dosage Given to Date: 10 Gy
Reference Point Session Dosage Given: 2 Gy
Session Number: 5

## 2024-02-27 ENCOUNTER — Other Ambulatory Visit: Payer: Self-pay

## 2024-02-27 ENCOUNTER — Ambulatory Visit
Admission: RE | Admit: 2024-02-27 | Discharge: 2024-02-27 | Disposition: A | Discharge status: Home / Self Care | Source: Ambulatory Visit | Attending: Radiation Oncology

## 2024-02-27 DIAGNOSIS — Z510 Encounter for antineoplastic radiation therapy: Secondary | ICD-10-CM | POA: Diagnosis not present

## 2024-02-27 LAB — RAD ONC ARIA SESSION SUMMARY
Course Elapsed Days: 7
Plan Fractions Treated to Date: 6
Plan Prescribed Dose Per Fraction: 2 Gy
Plan Total Fractions Prescribed: 35
Plan Total Prescribed Dose: 70 Gy
Reference Point Dosage Given to Date: 12 Gy
Reference Point Session Dosage Given: 2 Gy
Session Number: 6

## 2024-02-28 ENCOUNTER — Other Ambulatory Visit: Payer: Self-pay

## 2024-02-28 ENCOUNTER — Ambulatory Visit
Admission: RE | Admit: 2024-02-28 | Discharge: 2024-02-28 | Disposition: A | Discharge status: Home / Self Care | Source: Ambulatory Visit | Attending: Radiation Oncology | Admitting: Radiation Oncology

## 2024-02-28 DIAGNOSIS — Z510 Encounter for antineoplastic radiation therapy: Secondary | ICD-10-CM | POA: Diagnosis not present

## 2024-02-28 LAB — RAD ONC ARIA SESSION SUMMARY
Course Elapsed Days: 8
Plan Fractions Treated to Date: 7
Plan Prescribed Dose Per Fraction: 2 Gy
Plan Total Fractions Prescribed: 35
Plan Total Prescribed Dose: 70 Gy
Reference Point Dosage Given to Date: 14 Gy
Reference Point Session Dosage Given: 2 Gy
Session Number: 7

## 2024-02-29 ENCOUNTER — Telehealth: Payer: Self-pay | Admitting: Dietician

## 2024-02-29 ENCOUNTER — Ambulatory Visit
Admission: RE | Admit: 2024-02-29 | Discharge: 2024-02-29 | Disposition: A | Discharge status: Home / Self Care | Source: Ambulatory Visit | Attending: Radiation Oncology | Admitting: Radiation Oncology

## 2024-02-29 ENCOUNTER — Other Ambulatory Visit: Payer: Self-pay

## 2024-02-29 ENCOUNTER — Inpatient Hospital Stay: Admitting: Dietician

## 2024-02-29 DIAGNOSIS — Z510 Encounter for antineoplastic radiation therapy: Secondary | ICD-10-CM | POA: Insufficient documentation

## 2024-02-29 DIAGNOSIS — C4442 Squamous cell carcinoma of skin of scalp and neck: Secondary | ICD-10-CM | POA: Insufficient documentation

## 2024-02-29 DIAGNOSIS — R110 Nausea: Secondary | ICD-10-CM | POA: Diagnosis not present

## 2024-02-29 DIAGNOSIS — C109 Malignant neoplasm of oropharynx, unspecified: Secondary | ICD-10-CM | POA: Diagnosis present

## 2024-02-29 DIAGNOSIS — E860 Dehydration: Secondary | ICD-10-CM | POA: Diagnosis present

## 2024-02-29 DIAGNOSIS — C770 Secondary and unspecified malignant neoplasm of lymph nodes of head, face and neck: Secondary | ICD-10-CM | POA: Insufficient documentation

## 2024-02-29 LAB — RAD ONC ARIA SESSION SUMMARY
Course Elapsed Days: 9
Plan Fractions Treated to Date: 8
Plan Prescribed Dose Per Fraction: 2 Gy
Plan Total Fractions Prescribed: 35
Plan Total Prescribed Dose: 70 Gy
Reference Point Dosage Given to Date: 16 Gy
Reference Point Session Dosage Given: 2 Gy
Session Number: 8

## 2024-02-29 NOTE — Telephone Encounter (Signed)
 Patient scheduled for nutrition follow-up via telephone.   Patient did not answer. Left VM with request for return call. Contact information provided.

## 2024-03-03 ENCOUNTER — Ambulatory Visit
Admission: RE | Admit: 2024-03-03 | Discharge: 2024-03-03 | Disposition: A | Discharge status: Home / Self Care | Source: Ambulatory Visit | Attending: Radiation Oncology

## 2024-03-03 ENCOUNTER — Other Ambulatory Visit: Payer: Self-pay

## 2024-03-03 DIAGNOSIS — Z510 Encounter for antineoplastic radiation therapy: Secondary | ICD-10-CM | POA: Diagnosis not present

## 2024-03-03 LAB — RAD ONC ARIA SESSION SUMMARY
Course Elapsed Days: 12
Plan Fractions Treated to Date: 9
Plan Prescribed Dose Per Fraction: 2 Gy
Plan Total Fractions Prescribed: 35
Plan Total Prescribed Dose: 70 Gy
Reference Point Dosage Given to Date: 18 Gy
Reference Point Session Dosage Given: 2 Gy
Session Number: 9

## 2024-03-04 ENCOUNTER — Ambulatory Visit
Admission: RE | Admit: 2024-03-04 | Discharge: 2024-03-04 | Disposition: A | Discharge status: Home / Self Care | Source: Ambulatory Visit | Attending: Radiation Oncology

## 2024-03-04 ENCOUNTER — Other Ambulatory Visit: Payer: Self-pay

## 2024-03-04 DIAGNOSIS — Z510 Encounter for antineoplastic radiation therapy: Secondary | ICD-10-CM | POA: Diagnosis not present

## 2024-03-04 LAB — RAD ONC ARIA SESSION SUMMARY
Course Elapsed Days: 13
Plan Fractions Treated to Date: 10
Plan Prescribed Dose Per Fraction: 2 Gy
Plan Total Fractions Prescribed: 35
Plan Total Prescribed Dose: 70 Gy
Reference Point Dosage Given to Date: 20 Gy
Reference Point Session Dosage Given: 2 Gy
Session Number: 10

## 2024-03-05 ENCOUNTER — Other Ambulatory Visit: Payer: Self-pay | Admitting: Radiology

## 2024-03-05 ENCOUNTER — Telehealth: Payer: Self-pay | Admitting: Dietician

## 2024-03-05 ENCOUNTER — Ambulatory Visit
Admission: RE | Admit: 2024-03-05 | Discharge: 2024-03-05 | Discharge status: Home / Self Care | Source: Ambulatory Visit | Attending: Radiation Oncology

## 2024-03-05 ENCOUNTER — Inpatient Hospital Stay: Attending: Oncology | Admitting: Dietician

## 2024-03-05 ENCOUNTER — Other Ambulatory Visit: Payer: Self-pay

## 2024-03-05 DIAGNOSIS — E860 Dehydration: Secondary | ICD-10-CM | POA: Insufficient documentation

## 2024-03-05 DIAGNOSIS — C109 Malignant neoplasm of oropharynx, unspecified: Secondary | ICD-10-CM | POA: Insufficient documentation

## 2024-03-05 DIAGNOSIS — C099 Malignant neoplasm of tonsil, unspecified: Secondary | ICD-10-CM

## 2024-03-05 DIAGNOSIS — Z510 Encounter for antineoplastic radiation therapy: Secondary | ICD-10-CM | POA: Insufficient documentation

## 2024-03-05 DIAGNOSIS — C4442 Squamous cell carcinoma of skin of scalp and neck: Secondary | ICD-10-CM | POA: Insufficient documentation

## 2024-03-05 DIAGNOSIS — R110 Nausea: Secondary | ICD-10-CM | POA: Insufficient documentation

## 2024-03-05 DIAGNOSIS — C770 Secondary and unspecified malignant neoplasm of lymph nodes of head, face and neck: Secondary | ICD-10-CM | POA: Insufficient documentation

## 2024-03-05 LAB — RAD ONC ARIA SESSION SUMMARY
Course Elapsed Days: 14
Plan Fractions Treated to Date: 11
Plan Prescribed Dose Per Fraction: 2 Gy
Plan Total Fractions Prescribed: 35
Plan Total Prescribed Dose: 70 Gy
Reference Point Dosage Given to Date: 22 Gy
Reference Point Session Dosage Given: 2 Gy
Session Number: 11

## 2024-03-05 MED ORDER — LIDOCAINE VISCOUS HCL 2 % MT SOLN
15.0000 mL | OROMUCOSAL | 5 refills | Status: AC | PRN
Start: 2024-03-05 — End: ?

## 2024-03-05 NOTE — Telephone Encounter (Signed)
 Nutrition Follow-up:  Pt with SCC of oropharynx, p16+. He is receiving radiation therapy under the care of Dr. Izell. First RT 7/23   Spoke with patient via telephone for nutrition follow-up. Patient reports worsening sore throat. It was really bad after therapy today. He took some tylenol  with some relief. Lidocaine  called in today.  Patient having altered taste. Everything taste like metal. He is no longer eating solids as the taste is nauseating. Patient drinking 3-4 protein shakes (vanilla fairlife, fruit, protein powder). He can stomach these. He has high calorie high protein shake recipes.  Medications: reviewed  Labs: no new labs   Anthropometrics: Wt 214.2 lb redia) on 8/1  7/28 - 214.2 lb   NUTRITION DIAGNOSIS: Food and nutrition related knowledge deficit improving    INTERVENTION:  Encouraged pt to try high calorie shake recipes Suggested adding greek yogurt/ice cream to shakes for added calories  Discussed nutrition profiles of supplements, encourage Ensure Complete/equivalent for more calories (samples of Ensure + Boost VHC) Educated on strategies for taste changes - suggested baking soda salt water rinses before and after po Samples + handouts to be given 8/7 at [facility] clinic (RD left these with nurse navigator)    MONITORING, EVALUATION, GOAL: wt trends, intake   NEXT VISIT: Thursday August 14 after RT

## 2024-03-05 NOTE — Therapy (Signed)
 OUTPATIENT PHYSICAL THERAPY HEAD AND NECK BASELINE EVALUATION   Patient Name: Douglas Hester MRN: [id number] DOB:1961/03/04, 63 y.o., male Today's Date: 03/06/2024  END OF SESSION:  PT End of Session - 03/06/24 1148     Visit Number 1    Number of Visits 2    Date for PT Re-Evaluation 05/15/24    PT Start Time 1104    PT Stop Time 1140    PT Time Calculation (min) 36 min    Activity Tolerance Patient tolerated treatment well    Behavior During Therapy WFL for tasks assessed/performed          Past Medical History:  Diagnosis Date   Anxiety    Chronic GERD    Heart murmur    Hypertension    Tachycardia    Past Surgical History:  Procedure Laterality Date   CHOLECYSTECTOMY N/A 10/11/2018   Procedure: LAPAROSCOPIC CHOLECYSTECTOMY WITH INTRAOPERATIVE CHOLANGIOGRAM;  Surgeon: Curvin Deward MOULD, MD;  Location: [facility];  Service: General;  Laterality: N/A;   COLONOSCOPY  ~ 2014   done by GI in [place]. Polyps were removed. Patient not aware what type of polyps these were   DIRECT LARYNGOSCOPY N/A 01/28/2024   Procedure: LARYNGOSCOPY, DIRECT WITH BIOPSIES;  Surgeon: Tobie Eldora NOVAK, MD;  Location: [facility] OR;  Service: ENT;  Laterality: N/A;   ESOPHAGOGASTRODUODENOSCOPY  early 1990s   MD found ulcer   INGUINAL HERNIA REPAIR Left ~ 2010   with Mesh.    VASECTOMY     Patient Active Problem List   Diagnosis Date Noted   Squamous cell carcinoma of left tonsil (HCC) 02/06/2024   Lingual tonsil hypertrophy 01/28/2024   Biliary colic 10/11/2018   Gallstones 10/11/2018   Hypokalemia 05/01/2017   Elevated LFTs 05/01/2017   Elevated lipase 05/01/2017   Chest pain 04/29/2017   Fever, unspecified 04/29/2017   Tachycardia 04/29/2017   Hyperglycemia 04/29/2017    PCP: Charmaine Bright, PA-C  REFERRING PROVIDER: Lauraine Golden, MD  REFERRING DIAG: C09.9 (ICD-10-CM) - Squamous cell carcinoma of left tonsil (HCC)   THERAPY DIAG:  Abnormal posture - Plan: PT plan of care  cert/re-cert  Malignant neoplasm of tonsil (HCC) - Plan: PT plan of care cert/re-cert  Rationale for Evaluation and Treatment: Rehabilitation  ONSET DATE: 01/17/24  SUBJECTIVE:     SUBJECTIVE STATEMENT: Patient reports they are here today to be seen by their medical team for newly diagnosed cancer of left tonsil.    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 An Ultrasound was performed at that time showing a left submandibular mass suspicious for a necrotic lymph node. He then underwent a CT soft tissue neck to further investigate his symptoms. Scans showed a necrotic 2.6 cm left level II mass is suspicious a conglomerate nodal metastasis. CT chest and head were negative for any abnormalities. 01/14/24 Core biopsy of left neck showing SCC p 16 +. 01/17/24 Direct Laryngoscope with [name] which revealed no obvious abnormalities or signs of primary disease.  01/25/24 PET re-demonstrated the necrotic left-sided level 2 lymph node measuring 3 cm and symmetric upper pharyngeal uptake extending to the left side of the vallecular. No additional areas of abnormal radiotracer uptake at this time to suggest additional areas of disease. 01/28/24 Tonsil biopsy showing SCC. He will receive 35 fractions of  radiation to his left tonsil and bilateral neck which started on 02/20/24 and will complete 04/09/24.  PATIENT GOALS:   to be educated about the signs and symptoms of lymphedema and learn post op HEP.   PAIN:  Are you having pain? Yes: NPRS scale: 9/10 Pain location: L throat Pain description: sore, radiates in to ear Aggravating factors: lying down Relieving factors: salt water, tylenol   PRECAUTIONS: Active CA  RED FLAGS: None   WEIGHT BEARING RESTRICTIONS: No  FALLS:  Has patient fallen in last 6 months? No Does the patient have a fear  of falling that limits activity? 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  COGNITION: Overall cognitive status: Within functional limits for tasks assessed                  POSTURE:  Forward head and rounded shoulders posture  30 SEC SIT TO STAND: 13 reps in 30 sec without use of UEs which is  Below average for patient's age  SHOULDER AROM:   WFL but has a history of R shoulder injury, tore rotator cuff a few years ago that has healed but does not have full R shoulder ROM but functional   CERVICAL AROM: pt reports he thinks he has pinched nerves, his arms tingly, popping/grinding, was getting steroid shots but stopped, has been this way for a long time   Percent limited  Flexion WFL  Extension 50% limited  Right lateral flexion 75% limited  Left lateral flexion 75% limited  Right rotation 25% limited  Left rotation 25% limited    (Blank rows=not tested)   PATIENT EDUCATION:  Education details: Neck ROM, importance of posture when sitting, standing and lying down, deep breathing, walking program and importance of staying active throughout treatment, CURE article on staying active, Why exercise? flyer, lymphedema and PT info Person educated: Patient Education method: Explanation, Demonstration, Handout Education comprehension: Patient verbalized understanding and returned demonstration  HOME EXERCISE PROGRAM: Patient was instructed today in a home exercise program today for head and neck range of motion exercises. These included active cervical  flexion, active cervical extension, active cervical rotation to each direction, upper trap stretch, and shoulder retraction. Patient was encouraged to do these 2-3 times a day, holding for 5 sec each and completing for 5 reps. Pt was educated that once this becomes easier then hold the stretches for 30-60 seconds.    ASSESSMENT:  CLINICAL IMPRESSION: Pt arrives to PT with recently diagnosed left tonsillar cancer. He will receive 35 fractions of radiation to his left tonsil and bilateral neck which started on 02/20/24 and will complete 04/09/24.Pt's cervical ROM was limited in all directions except for flexion. Educated pt about signs and symptoms of lymphedema as well as anatomy and physiology of lymphatic system. Educated pt in importance of staying as active as possible throughout treatment to decrease fatigue as well as head and neck ROM exercises to decrease loss of ROM. Will see pt after completion of radiation to reassess ROM and assess for lymphedema and to determine therapy needs at that time.  Pt will benefit  from skilled therapeutic intervention to improve on the following deficits: Decreased knowledge of precautions and postural dysfunction.   PT treatment/interventions: ADL/self-care home management, pt/family education, therapeutic exercise. Other interventions [id number]- PT Re-evaluation, 97110-Therapeutic exercises, 97530- Therapeutic activity, [id number]- Neuromuscular re-education, 97535- Self Care, [id number]- Manual therapy, 97760- Orthotic Initial, and [id number]- Orthotic/Prosthetic subsequent  REHAB POTENTIAL: Good  CLINICAL DECISION MAKING: Evolving/moderate complexity  EVALUATION COMPLEXITY: Moderate   GOALS: Goals reviewed with patient? YES  LONG TERM GOALS: (STG=LTG)   Name Target Date  Goal status  1 Patient will be able to verbalize understanding of a home exercise program for cervical range of motion, posture, and walking.   Baseline:  No knowledge 03/06/2024 Achieved at eval  2  Patient will be able to verbalize understanding of proper sitting and standing posture. Baseline:  No knowledge 03/06/2024 Achieved at eval  3 Patient will be able to verbalize understanding of lymphedema risk and availability of treatment for this condition Baseline:  No knowledge 03/06/2024 Achieved at eval  4 Pt will demonstrate a return to full cervical ROM and function post operatively compared to baselines and not demonstrate any signs or symptoms of lymphedema.  Baseline: See objective measurements taken today. 05/15/24 New    PLAN:  PT FREQUENCY/DURATION: EVAL and 1 follow up appointment.   PLAN FOR NEXT SESSION: will reassess 2 weeks after completion of radiation to determine needs.  Patient will follow up at outpatient cancer rehab 2 weeks after completion of radiation.  If the patient requires physical therapy at that time, a specific plan will be dictated and sent to the referring physician for approval. The patient was educated today on appropriate basic range of motion exercises to begin now and continue throughout radiation and educated on the signs and symptoms of lymphedema. Patient verbalized good understanding.     Physical Therapy Information for During and After Head/Neck Cancer Treatment: Lymphedema is a swelling condition that you may be at risk for in your neck and/or face if you have radiation treatment to the area and/or if you have surgery that includes removing lymph nodes.  There is treatment available for this condition and it is not life-threatening.  Contact your physician or physical therapist with concerns. An excellent resource for those seeking information on lymphedema is the National Lymphedema Network's website.  It can be accessed at www.lymphnet.org If you notice swelling in your neck or face at any time following surgery (even if it is many years from now), please contact your doctor or physical therapist to discuss this.  Lymphedema can be treated at any  time but it is easier for you if it is treated early on. If you have had surgery to your neck, please check with your surgeon about how soon to start doing neck range of motion exercises.  If you are not having surgery, I encourage you to start doing neck range of motion exercises today and continue these while undergoing treatment, UNLESS you have irritation of your skin or soft tissue that is aggravated by doing them.  These exercises are intended to help you prevent loss of range of motion and/or to gain range of motion in your neck (which can be limited by tightening effects of radiation), and NOT to aggravate these tissues if they develop sensitivities from treatment. Neck range of motion exercises should be done to the point of feeling a GENTLE, TOLERABLE stretch only.  You are encouraged to start a walking or other exercise program tomorrow and continue this as much  as you are able through and after treatment.  Please feel free to call me with any questions. Florina Lanis Carbon, PT, CLT Physical Therapist and Certified Lymphedema Therapist [facility] [address], [place], [place] [address] [phone] [email or web address]  WALKING  Walking is a great form of exercise to increase your strength, endurance and overall fitness.  A walking program can help you start slowly and gradually build endurance as you go.  Everyone's ability is different, so each person's starting point will be different.  You do not have to follow them exactly.  The are just samples. You should simply find out what's right for you and stick to that program.   In the beginning, you'll start off walking 2-3 times a day for short distances.  As you get stronger, you'll be walking further at just 1-2 times per day.  A. You Can Walk For A Certain Length Of Time Each Day    Walk 5 minutes 3 times per day.  Increase 2 minutes every 2 days (3 times per day).  Work up to 25-30  minutes (1-2 times per day).   Example:   Day 1-2 5 minutes 3 times per day   Day 7-8 12 minutes 2-3 times per day   Day 13-14 25 minutes 1-2 times per day  B. You Can Walk For a Certain Distance Each Day     Distance can be substituted for time.    Example:   3 trips to mailbox (at road)   3 trips to corner of block   3 trips around the block  C. Go to local high school and use the track.    Walk for distance ____ around track  Or time ____ minutes  D. Walk ____ Jog ____ Run ___   Why exercise?  So many benefits! Here are SOME of them: Heart health, including raising your good cholesterol level and reducing heart rate and blood pressure Lung health, including improved lung capacity It burns fats, and most of us  can stand to be leaner, whether or not we are overweight. It increases the body's natural painkillers and mood elevators, so makes you feel better. Not only makes you feel better, but look better too Improves sleep Takes a bite out of stress May decrease your risk of many types of cancer If you are currently undergoing cancer treatment, exercise may improve your ability to tolerate treatments including chemotherapy. For everybody, it can improve your energy level. Those with cancer-related fatigue report a 40-50% reduction in this symptom when exercising regularly. If you are a survivor of breast, colon, or prostate cancer, it may decrease your risk of a recurrence. (This may hold for other cancers too, but so far we have data just for these three types.)  How to exercise: Get your doctor's okay. Pick something you enjoy doing, like walking, Zumba, biking, swimming, or whatever. Start at low intensity and time, then gradually increase.  (See walking program handout.) Set a goal to achieve over time.  The American Cancer Society, American Heart Association, and U.S. Dept. of Health and Human Services recommend 150 minutes of moderate exercise, 75 minutes of vigorous  exercise, or a combination of both per week. This should be done in episodes at least 10 minutes long, spread throughout the week.  Need help being motivated? Pick something you enjoy doing, because you'll be more inclined to stick with that activity than something that feels like a chore. Do it with a friend  so that you are accountable to each other. Schedule it into your day. Place it on your calendar and keep that appointment just like you do any appointment that you make. Join an exercise group that meets at a specific time.  That way, you have to show up on time, and that makes it harder to procrastinate about doing your workout.  It also keeps you accountable--people begin to expect you to be there. Join a gym where you feel comfortable and not intimidated, at the right cost. Sign up for something that you'll need to be in shape for on a specific date, like a 1K or a 5K to walk or run, a 20 or 30 mile bike ride, a mud run or something like that. If the date is looming, you know you'll need to train to be ready for it.  An added benefit is that many of these are fundraisers for good causes. If you've already paid for a gym membership, group exercise class or event, you might as well work out, so you haven't wasted your money!    [facility] [place], PT 03/06/2024, 11:55 AM

## 2024-03-06 ENCOUNTER — Encounter: Payer: Self-pay | Admitting: Physical Therapy

## 2024-03-06 ENCOUNTER — Other Ambulatory Visit: Payer: Self-pay

## 2024-03-06 ENCOUNTER — Ambulatory Visit
Admission: RE | Admit: 2024-03-06 | Discharge: 2024-03-06 | Disposition: A | Discharge status: Home / Self Care | Source: Ambulatory Visit | Attending: Radiation Oncology

## 2024-03-06 ENCOUNTER — Ambulatory Visit: Attending: Radiation Oncology

## 2024-03-06 ENCOUNTER — Ambulatory Visit: Attending: Radiation Oncology | Admitting: Physical Therapy

## 2024-03-06 DIAGNOSIS — R293 Abnormal posture: Secondary | ICD-10-CM | POA: Diagnosis present

## 2024-03-06 DIAGNOSIS — Z510 Encounter for antineoplastic radiation therapy: Secondary | ICD-10-CM | POA: Diagnosis not present

## 2024-03-06 DIAGNOSIS — C099 Malignant neoplasm of tonsil, unspecified: Secondary | ICD-10-CM | POA: Insufficient documentation

## 2024-03-06 DIAGNOSIS — R1310 Dysphagia, unspecified: Secondary | ICD-10-CM | POA: Diagnosis present

## 2024-03-06 LAB — RAD ONC ARIA SESSION SUMMARY
Course Elapsed Days: 15
Plan Fractions Treated to Date: 12
Plan Prescribed Dose Per Fraction: 2 Gy
Plan Total Fractions Prescribed: 35
Plan Total Prescribed Dose: 70 Gy
Reference Point Dosage Given to Date: 24 Gy
Reference Point Session Dosage Given: 2 Gy
Session Number: 12

## 2024-03-06 NOTE — Therapy (Signed)
 OUTPATIENT SPEECH LANGUAGE PATHOLOGY ONCOLOGY EVALUATION   Patient Name: Douglas Hester MRN: [id number] DOB:June 19, 1961, 63 y.o., male Today's Date: 03/06/2024  PCP: Katina Pfeiffer, MD REFERRING PROVIDER: Montell Domino, MD  END OF SESSION:  End of Session - 03/06/24 1205     Visit Number 1    Number of Visits 3    Date for SLP Re-Evaluation 06/04/24    SLP Start Time 1205    SLP Stop Time  1242    SLP Time Calculation (min) 37 min    Activity Tolerance Patient tolerated treatment well          Past Medical History:  Diagnosis Date   Anxiety    Chronic GERD    Heart murmur    Hypertension    Tachycardia    Past Surgical History:  Procedure Laterality Date   CHOLECYSTECTOMY N/A 10/11/2018   Procedure: LAPAROSCOPIC CHOLECYSTECTOMY WITH INTRAOPERATIVE CHOLANGIOGRAM;  Surgeon: Curvin Deward MOULD, MD;  Location: [facility];  Service: General;  Laterality: N/A;   COLONOSCOPY  ~ 2014   done by GI in [place]. Polyps were removed. Patient not aware what type of polyps these were   DIRECT LARYNGOSCOPY N/A 01/28/2024   Procedure: LARYNGOSCOPY, DIRECT WITH BIOPSIES;  Surgeon: Tobie Eldora NOVAK, MD;  Location: [facility] OR;  Service: ENT;  Laterality: N/A;   ESOPHAGOGASTRODUODENOSCOPY  early 1990s   MD found ulcer   INGUINAL HERNIA REPAIR Left ~ 2010   with Mesh.    VASECTOMY     Patient Active Problem List   Diagnosis Date Noted   Squamous cell carcinoma of left tonsil (HCC) 02/06/2024   Lingual tonsil hypertrophy 01/28/2024   Biliary colic 10/11/2018   Gallstones 10/11/2018   Hypokalemia 05/01/2017   Elevated LFTs 05/01/2017   Elevated lipase 05/01/2017   Chest pain 04/29/2017   Fever, unspecified 04/29/2017   Tachycardia 04/29/2017   Hyperglycemia 04/29/2017    ONSET DATE: June 2025   REFERRING DIAG: SCC of lt tonsil  THERAPY DIAG:  Dysphagia, unspecified type  Rationale for Evaluation and Treatment: Rehabilitation  SUBJECTIVE:   SUBJECTIVE STATEMENT: Pt only drinking  Ensure and protein shakes due to dysgeusia.   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 He presented to the ED on 01/03/24 with c/o headaches and an enlarged lymph node to his left neck. An Ultrasound was performed at that time showing a left submandibular mass suspicious for a necrotic lymph node. He then underwent a CT soft tissue neck to further investigate his symptoms. Scans showed a necrotic 2.6 cm left level II mass is suspicious a conglomerate nodal metastasis. CT chest and head were negative for any abnormalities. 01/04/24 Consult with [name]. 01/14/24 Core biopsy of left neck showing SCC p 16 +. 01/17/24 Direct Laryngoscope with [name] which revealed no obvious abnormalities or signs of primary disease. 01/25/24 PET re-demonstrated the necrotic left-sided level 2 lymph node measuring 3 cm and symmetric upper pharyngeal uptake extending to the left side of the vallecular. No additional areas of abnormal radiotracer uptake at this time to suggest additional areas of disease. 01/28/24 Tonsil biopsy showing SCC. 01/29/24 Radiation consult and 02/06/24 Medical oncology consult. He will receive radiation alone.  Treatment plan:  He will receive 35 fractions of radiation to his left tonsil and bilateral neck which started on 02/20/24 and will complete 04/09/24. Pretreatment procedures: None  PAIN:  Are  you having pain? Yes: NPRS scale: 9/10 Pain location: L throat Pain description: sore, radiates in to ear Aggravating factors: lying down Relieving factors: salt water, tylenol   FALLS: Has patient fallen in last 6 months?  See PT evaluation for details  LIVING ENVIRONMENT: Lives with: lives alone Lives in: House/apartment  PLOF:  Level of assistance: Independent with ADLs, Independent with IADLs Employment: Full-time employment  PATIENT GOALS: maintain WNL swallowing  OBJECTIVE:  Note:  Objective measures were completed at Evaluation unless otherwise noted. DIAGNOSTIC FINDINGS: see pertinent history above  INSTRUMENTAL SWALLOW STUDY FINDINGS (MBSS) none noted as of today  COGNITION: Overall cognitive status: Within functional limits for tasks assessed  LANGUAGE: Receptive and Expressive language appeared WNL.  ORAL MOTOR EXAMINATION: Overall status: Impaired: Lingual: Right (ROM) Comments: 7/10 pain with rom on rt top  MOTOR SPEECH: Overall motor speech: Appears intact  SUBJECTIVE DYSPHAGIA REPORTS:  Date of onset: dysgeusia to point of just doing protein shakes Reported symptoms: odynophagia  Current diet: thin liquids and nectar thick liquids due to dysgeusia  Co-morbid voice changes: No  FACTORS WHICH MAY INCREASE RISK OF ADVERSE EVENT IN PRESENCE OF ASPIRATION:  General health: well appearing  Risk factors: none evident   CLINICAL SWALLOW ASSESSMENT:   Dentition: adequate natural dentition Vocal quality at baseline: normal Patient directly observed with POs: Yes: thin liquids and nectar thick liquids  Feeding: able to feed self Liquids provided by: bottle Oral phase signs and symptoms: none noted Pharyngeal phase signs and symptoms: none noted                                                                                                                             TREATMENT DATE:   03/06/24: Research states the risk for dysphagia increases due to radiation and/or chemotherapy treatment due to a variety of factors, so SLP educated the pt about the possibility of reduced/limited ability for PO intake during rad tx. SLP also educated pt regarding possible changes to swallowing musculature after rad tx, and why adherence to dysphagia HEP provided today and PO consumption was necessary to inhibit muscle fibrosis following rad tx and to mitigate muscle disuse atrophy. SLP informed pt why this would be detrimental to their swallowing status and to their  pulmonary health. Pt demonstrated understanding of these things to SLP. SLP encouraged pt to safely eat and drink as deep into their radiation/chemotherapy as possible to provide the best possible long-term swallowing outcome for pt. SLP then developed an individualized HEP for pt involving oral and pharyngeal strengthening and ROM and pt was instructed how to perform these exercises, including SLP demonstration. After SLP demonstration, pt return demonstrated each exercise. SLP ensured pt performance was correct prior to educating pt on next exercise. Pt required occasional min cues faded to modified independent to perform HEP. Pt was instructed to complete this program 6-7 days/week, at least 20 reps a day until 6 months after his or her last day of  rad tx, and then x2 a week after that, indefinitely. Among other modifications for days when pt cannot functionally swallow, SLP also suggested pt to perform only non-swallowing tasks on the handout/HEP, and if necessary to cycle through the swallowing portion so the full program of exercises can be completed instead of fatiguing on one of the swallowing exercises and being unable to perform the other swallowing exercises. SLP instructed that swallowing exercises should then be added back into the regimen as pt is able to do so.    PATIENT EDUCATION: Education details: late effects head/neck radiation on swallow function, HEP procedure, and modification to HEP when difficulty experienced with swallowing during and after radiation course Person educated: Patient and Spouse Education method: Explanation, Demonstration, Verbal cues, and Handouts Education comprehension: verbalized understanding, returned demonstration, verbal cues required, and needs further education   ASSESSMENT:  CLINICAL IMPRESSION: Patient is a 63 y.o. M who was seen today for assessment of swallowing as they undergo radiation/chemoradiation therapy. Today pt drank nectar liquids without  overt s/s oral or pharyngeal difficulty. At this time pt swallowing is deemed WNL/WFL with these POs. There are no overt s/s aspiration PNA observed by SLP nor any reported by pt at this time. Data indicate that pt's swallow ability will likely decrease over the course of radiation/chemoradiation therapy and could very well decline over time following the conclusion of that therapy due to muscle disuse atrophy and/or muscle fibrosis. Pt will cont to need to be seen by SLP in order to assess safety of PO intake, assess the need for recommending any objective swallow assessment, and ensuring pt is correctly completing the individualized HEP.  OBJECTIVE IMPAIRMENTS: include dysphagia. These impairments are limiting patient from safety when swallowing. Factors affecting potential to achieve goals and functional outcome are none noted today. Patient will benefit from skilled SLP services to address above impairments and improve overall function.   REHAB POTENTIAL: Good     GOALS: Goals reviewed with patient? No   SHORT TERM GOALS: Target: 3rd total session   1. Pt will complete HEP with modified independence in 2 sessions Baseline: Goal status: INITIAL   2.  pt will tell SLP why pt is completing HEP with modified independence Baseline:  Goal status: INITIAL   3.  pt will describe 3 overt s/s aspiration PNA with modified independence Baseline:  Goal status: INITIAL   4.  pt will tell SLP how a food journal could hasten return to a more normalized diet Baseline:  Goal status: INITIAL     LONG TERM GOALS: Target: 7th total session   1.  pt will complete HEP with independence over two visits Baseline:  Goal status: INITIAL   2.  pt will describe how to modify HEP over time, and the timeline associated with reduction in HEP frequency with modified independence over two sessions Baseline:  Goal status: INITIAL     PLAN:   SLP FREQUENCY:  once approx every 4 weeks   SLP DURATION:  7  sessions   PLANNED INTERVENTIONS: Aspiration precaution training, Pharyngeal strengthening exercises, Diet toleration management , Trials of upgraded texture/liquids, SLP instruction and feedback, Compensatory strategies, and Patient/family education, [phone] (treatment of swallowing dysfunction and/or oral function for feeding)    Channa Hazelett, CCC-SLP 03/06/2024, 2:02 PM

## 2024-03-06 NOTE — Patient Instructions (Signed)
 SWALLOWING EXERCISES Do these 5-6 days/week until 6 months after your last day of radiation, then 2 days per week afterwards You can use 1-2 drops of liquid to help you swallow, if your mouth gets dry  Effortful Swallows - Press your tongue against the roof of your mouth for 3 seconds, then swallow as hard as you can - Do at least 20 reps/day, in sets of 5-10  Masako Swallow - swallow with your tongue sticking out - Stick tongue out past your lips and gently bite tongue with your teeth - Swallow, while holding your tongue with your teeth - Do at least 20 reps/day, in sets of 5-10   Shaker Exercise - head lift - Lie flat on your back in your bed, the floor, or a couch  - Raise your head and look at your feet - KEEP YOUR SHOULDERS DOWN - HOLD FOR 45-60 SECONDS, then lower your head back down - Repeat 3 times, 2-3 times a day  [employer] - "squeeze swallow" exercise - Swallow, and squeeze tight to keep your Adam's Apple up - Hold the squeeze for 5-7 seconds - then relax - Do at least 20 reps/day, in sets of 5-10  5.   Chin tuck - Place a rolled up towel (4 inches wide) under your chin near your neck - Tuck your chin and push hard on the towel for 5 seconds - Do at least 20 reps/day, in sets of 5-10

## 2024-03-06 NOTE — Progress Notes (Signed)
 Oncology Nurse Navigator Documentation   I met with [name] before and after his appointments with SLP and PT today during head and neck MDC. He is tolerating treatment well and denies any questions or concerns. He knows to call me if he has any questions as he proceeds through treatment.   Douglas Jefferson RN, BSN, OCN Head & Neck Oncology Nurse Navigator [facility] at [facility] Phone # [phone]  Fax # [phone]

## 2024-03-07 ENCOUNTER — Other Ambulatory Visit: Payer: Self-pay

## 2024-03-07 ENCOUNTER — Ambulatory Visit
Admission: RE | Admit: 2024-03-07 | Discharge: 2024-03-07 | Disposition: A | Discharge status: Home / Self Care | Source: Ambulatory Visit | Attending: Radiation Oncology | Admitting: Radiation Oncology

## 2024-03-07 DIAGNOSIS — Z510 Encounter for antineoplastic radiation therapy: Secondary | ICD-10-CM | POA: Diagnosis not present

## 2024-03-07 LAB — RAD ONC ARIA SESSION SUMMARY
Course Elapsed Days: 16
Plan Fractions Treated to Date: 13
Plan Prescribed Dose Per Fraction: 2 Gy
Plan Total Fractions Prescribed: 35
Plan Total Prescribed Dose: 70 Gy
Reference Point Dosage Given to Date: 26 Gy
Reference Point Session Dosage Given: 2 Gy
Session Number: 13

## 2024-03-10 ENCOUNTER — Other Ambulatory Visit: Payer: Self-pay

## 2024-03-10 ENCOUNTER — Ambulatory Visit
Admission: RE | Admit: 2024-03-10 | Discharge: 2024-03-10 | Disposition: A | Discharge status: Home / Self Care | Source: Ambulatory Visit | Attending: Radiation Oncology

## 2024-03-10 ENCOUNTER — Ambulatory Visit
Admission: RE | Admit: 2024-03-10 | Discharge: 2024-03-10 | Disposition: A | Discharge status: Home / Self Care | Source: Ambulatory Visit | Attending: Radiation Oncology | Admitting: Radiation Oncology

## 2024-03-10 ENCOUNTER — Other Ambulatory Visit: Payer: Self-pay | Admitting: Radiation Oncology

## 2024-03-10 ENCOUNTER — Ambulatory Visit
Admission: RE | Admit: 2024-03-10 | Discharge: 2024-03-10 | Disposition: A | Discharge status: Home / Self Care | Source: Ambulatory Visit | Attending: Internal Medicine | Admitting: Internal Medicine

## 2024-03-10 DIAGNOSIS — C099 Malignant neoplasm of tonsil, unspecified: Secondary | ICD-10-CM

## 2024-03-10 DIAGNOSIS — Z510 Encounter for antineoplastic radiation therapy: Secondary | ICD-10-CM | POA: Diagnosis not present

## 2024-03-10 LAB — RAD ONC ARIA SESSION SUMMARY
Course Elapsed Days: 19
Plan Fractions Treated to Date: 14
Plan Prescribed Dose Per Fraction: 2 Gy
Plan Total Fractions Prescribed: 35
Plan Total Prescribed Dose: 70 Gy
Reference Point Dosage Given to Date: 28 Gy
Reference Point Session Dosage Given: 2 Gy
Session Number: 14

## 2024-03-10 LAB — BASIC METABOLIC PANEL - CANCER CENTER ONLY
Anion gap: 6 (ref 5–15)
BUN: 19 mg/dL (ref 8–23)
CO2: 30 mmol/L (ref 22–32)
Calcium: 9.6 mg/dL (ref 8.9–10.3)
Chloride: 100 mmol/L (ref 98–111)
Creatinine: 0.95 mg/dL (ref 0.61–1.24)
GFR, Estimated: >60 mL/min (ref >60)
Glucose, Bld: 83 mg/dL (ref 70–99)
Potassium: 4.6 mmol/L (ref 3.5–5.1)
Sodium: 136 mmol/L (ref 135–145)

## 2024-03-10 MED ORDER — OXYCODONE HCL 5 MG/5ML PO SOLN: 5.0000 mg | ORAL | 0 refills | Status: DC | PRN

## 2024-03-10 NOTE — Progress Notes (Signed)
 Oncology Nurse Navigator Documentation   I left Mr. Codrington a VM just now informing him of an appointment for IVF's tomorrow at 8:00 before his 10:15 radiation appointment. I asked for a return call to let me know that he got the message.  [name] RN, BSN, OCN Head & Neck Oncology Nurse Navigator [facility] at [facility] Phone # [phone]  Fax # [phone]

## 2024-03-11 ENCOUNTER — Telehealth: Payer: Self-pay | Admitting: *Deleted

## 2024-03-11 ENCOUNTER — Other Ambulatory Visit: Payer: Self-pay

## 2024-03-11 ENCOUNTER — Encounter: Payer: Self-pay | Admitting: Radiation Oncology

## 2024-03-11 ENCOUNTER — Encounter (HOSPITAL_COMMUNITY): Payer: Self-pay

## 2024-03-11 ENCOUNTER — Ambulatory Visit

## 2024-03-11 ENCOUNTER — Ambulatory Visit
Admission: RE | Admit: 2024-03-11 | Discharge: 2024-03-11 | Disposition: A | Discharge status: Home / Self Care | Source: Ambulatory Visit | Attending: Radiation Oncology

## 2024-03-11 ENCOUNTER — Inpatient Hospital Stay: Admitting: Dietician

## 2024-03-11 VITALS — BP 104/67 | HR 78 | Temp 98.5°F | Resp 18

## 2024-03-11 DIAGNOSIS — Z510 Encounter for antineoplastic radiation therapy: Secondary | ICD-10-CM | POA: Diagnosis not present

## 2024-03-11 DIAGNOSIS — C099 Malignant neoplasm of tonsil, unspecified: Secondary | ICD-10-CM

## 2024-03-11 LAB — RAD ONC ARIA SESSION SUMMARY
Course Elapsed Days: 20
Plan Fractions Treated to Date: 15
Plan Prescribed Dose Per Fraction: 2 Gy
Plan Total Fractions Prescribed: 35
Plan Total Prescribed Dose: 70 Gy
Reference Point Dosage Given to Date: 30 Gy
Reference Point Session Dosage Given: 2 Gy
Session Number: 15

## 2024-03-11 MED ORDER — SODIUM CHLORIDE 0.9 % IV SOLN
Freq: Once | INTRAVENOUS | Status: AC
Start: 2024-03-11 — End: 2024-03-11

## 2024-03-11 NOTE — Patient Instructions (Signed)
 Dehydration, Adult Dehydration is a condition in which there is not enough water or other fluids in the body. This happens when a person loses more fluids than they take in. Important organs cannot work right without the right amount of fluids. Any loss of fluids from the body can cause dehydration. Dehydration can be mild, worse, or very bad. It should be treated right away to keep it from getting very bad. What are the causes? Conditions that cause loss of water in the body. They include: Watery poop (diarrhea). Vomiting. Sweating a lot. Fever. Infection. Peeing (urinating) a lot. Not drinking enough fluids. Certain medicines, such as medicines that take extra fluid out of the body (diuretics). Lack of safe drinking water. Not being able to get enough water and food. What increases the risk? Having a long-term (chronic) illness that has not been treated the right way, such as: Diabetes. Heart disease. Kidney disease. Being 25 years of age or older. Having a disability. Living in a place that is high above the ground or sea (high in altitude). The thinner, drier air causes more fluid loss. Doing exercises that put stress on your body for a long time. Being active when in hot places. What are the signs or symptoms? Symptoms of dehydration depend on how bad it is. Mild or worse dehydration Thirst. Dry lips or dry mouth. Feeling dizzy or light-headed. Muscle cramps. Passing little pee or dark pee. Pee may be the color of tea. Headache. Very bad dehydration Changes in skin. Skin may: Be cold to the touch (clammy). Be blotchy or pale. Not go back to normal right after you pinch it and let it go. Little or no tears, pee, or sweat. Fast breathing. Low blood pressure. Weak pulse. Pulse that is more than 100 beats a minute when you are sitting still. Other changes, such as: Feeling very thirsty. Eyes that look hollow (sunken). Cold hands and feet. Being confused. Being very  tired (lethargic) or having trouble waking from sleep. Losing weight. Loss of consciousness. How is this treated? Treatment for this condition depends on how bad your dehydration is. Treatment should start right away. Do not wait until your condition gets very bad. Very bad dehydration is an emergency. You will need to go to a hospital. Mild or worse dehydration can be treated at home. You may be asked to: Drink more fluids. Drink an oral rehydration solution (ORS). This drink gives you the right amount of fluids, salts, and minerals (electrolytes). Very bad dehydration can be treated: With fluids through an IV tube. By correcting low levels of electrolytes in the body. By treating the problem that caused your dehydration. Follow these instructions at home: Oral rehydration solution If told by your doctor, drink an ORS: Make an ORS. Use instructions on the package. Start by drinking small amounts, about  cup (120 mL) every 5-10 minutes. Slowly drink more until you have had the amount that your doctor said to have.  Eating and drinking  Drink enough clear fluid to keep your pee pale yellow. If you were told to drink an ORS, finish the ORS first. Then, start slowly drinking other clear fluids. Drink fluids such as: Water. Do not drink only water. Doing that can make the salt (sodium) level in your body get too low. Water from ice chips you suck on. Fruit juice that you have added water to (diluted). Low-calorie sports drinks. Eat foods that have the right amounts of salts and minerals, such as bananas, oranges, potatoes,  tomatoes, or spinach. Do not drink alcohol. Avoid drinks that have caffeine or sugar. These include:: High-calorie sports drinks. Fruit juice that you did not add water to. Soda. Coffee or energy drinks. Avoid foods that are greasy or have a lot of fat or sugar. General instructions Take over-the-counter and prescription medicines only as told by your doctor. Do  not take sodium tablets. Doing that can make the salt level in your body get too high. Return to your normal activities as told by your doctor. Ask your doctor what activities are safe for you. Keep all follow-up visits. Your doctor may check and change your treatment. Contact a doctor if: You have pain in your belly (abdomen) and the pain: Gets worse. Stays in one place. You have a rash. You have a stiff neck. You get angry or annoyed more easily than normal. You are more tired or have a harder time waking than normal. You feel weak or dizzy. You feel very thirsty. Get help right away if: You have any symptoms of very bad dehydration. You vomit every time you eat or drink. Your vomiting gets worse, does not go away, or you vomit blood or green stuff. You are getting treatment, but symptoms are getting worse. You have a fever. You have a very bad headache. You have: Diarrhea that gets worse or does not go away. Blood in your poop (stool). This may cause poop to look black and tarry. No pee in 6-8 hours. Only a small amount of pee in 6-8 hours, and the pee is very dark. You have trouble breathing. These symptoms may be an emergency. Get help right away. Call 911. Do not wait to see if the symptoms will go away. Do not drive yourself to the hospital. This information is not intended to replace advice given to you by your health care provider. Make sure you discuss any questions you have with your health care provider. Document Revised: 02/13/2022 Document Reviewed: 02/13/2022 Elsevier Patient Education  2024 [employer].

## 2024-03-11 NOTE — Progress Notes (Signed)
 RE: GTUBE review Received: Nilsa Karalee Wilkie MARLA, MD  Tommie Riggs Approved, anatomy looks good.  HKM       Previous Messages    ----- Message ----- From: Tommie Riggs Sent: 03/10/2024  11:23 AM EDT To: Ir Procedure Requests Subject: GTUBE review                                  PROCEDURE:  G-TUBE PLACEMENT  REASON:Squamous cell carcinoma of left tonsil, dysphagia during radiation for head and neck. cancer  HISTORY: PET 6/27, CT 6/5  PHYSICIAN: SQUIRE

## 2024-03-11 NOTE — Telephone Encounter (Signed)
 See 02/25/2024 telephone encounter. Douglas Hester in to see this nurse regarding [employer]. I've called.  They sent form three times.  I'm not receiving any money. 1400: [name], [phone] (home) informed this nurse he called [employer] again and a form is being sent now, 03/11/2024 Have not yet received form.  This nurse called [employer] [phone]).  Offered email address due to not receiving any faxes.  [facility]shared fax number used [phone]).  This nurse provided correct number [phone].  No further questions or needs. Message left for Douglas Hester with above information.

## 2024-03-11 NOTE — Progress Notes (Signed)
 Nutrition Follow-up:  Pt with SCC of oropharynx, p16+. He is receiving radiation therapy under the care of Dr. Izell. First RT 7/23   Received in-basket from navigator. Planning for PEG placement 8/14.   Met with patient in infusion. He is receiving supportive IVF today. Patient reports the taste of oral intake is like eating/drinking metal sprayed with Windex. It is horrendous. Patient has been able to tolerate vanilla Ensure Complete thinned with milk. Drinking this 3 times/day. He has worsening sore throat. Patient agreeable to having feeding tube after weekly under treatment with Dr. Izell.    Medications: reviewed   Labs: 8/11 - reviewed   Anthropometrics: Wt 208.4 lb 8/11 - decreased 3% in 10 days - this is clinically severe  8/1 - 214.2 lb  7/28 - 214.2 lb   Estimated Energy Needs  Kcals: [id number] Protein: 123-142 Fluid: >2.6 L  NEW NUTRITION DIAGNOSIS: Inadequate oral intake related to [facility] and associated treatment side effects as evidenced by altered taste, severe 3% wt loss in 10 days  NUTRITION DIAGNOSIS: Food and nutrition related knowledge deficit improving    INTERVENTION:  Continue Ensure Complete thinned with milk as tolerated  Suggested trying Boost VHC thinned with milk for added calories - samples provided Briefly discussed feeding tube placement - will plan to complete bolus education at f/u Support and encouragement    Initiate tube feedings pending PEG placement: 7 cartons Nutren 1.5 split over 4 feedings Flush tube with 60 ml water before and after each bolus Give additional 3.5 cups water (830 ml water) via PEG/po to meet hydration needs  Provides 2625 kcal, 119 g, 1337 ml free water (2647 ml total water). 1750 ml/day meets >95% est needs, 100% DRI  MONITORING, EVALUATION, GOAL: wt trends, intake, PEG   NEXT VISIT: Friday August 15 during IVF

## 2024-03-12 ENCOUNTER — Ambulatory Visit
Admission: RE | Admit: 2024-03-12 | Discharge: 2024-03-12 | Disposition: A | Discharge status: Home / Self Care | Source: Ambulatory Visit | Attending: Radiation Oncology | Admitting: Radiation Oncology

## 2024-03-12 ENCOUNTER — Other Ambulatory Visit: Payer: Self-pay

## 2024-03-12 ENCOUNTER — Telehealth: Payer: Self-pay | Admitting: Radiation Oncology

## 2024-03-12 DIAGNOSIS — Z510 Encounter for antineoplastic radiation therapy: Secondary | ICD-10-CM | POA: Diagnosis not present

## 2024-03-12 LAB — RAD ONC ARIA SESSION SUMMARY
Course Elapsed Days: 21
Plan Fractions Treated to Date: 16
Plan Prescribed Dose Per Fraction: 2 Gy
Plan Total Fractions Prescribed: 35
Plan Total Prescribed Dose: 70 Gy
Reference Point Dosage Given to Date: 32 Gy
Reference Point Session Dosage Given: 2 Gy
Session Number: 16

## 2024-03-12 NOTE — Progress Notes (Signed)
 Oncology Nurse Navigator Documentation   Met with Douglas Hester to provide PEG education prior to tomorrow's placement.   Using  PEG teaching device and Teach Back, provided education for PEG use and care, including: hand hygiene, gravity bolus administration of daily water flushes and nutritional supplement, fluids and medications; care of tube insertion site including daily dressing change and cleaning; S&S of infection.   Douglas Hester correctly verbalized procedures for and provided correct return demonstration of gravity administration of water, dressing change and site care.  I provided written instructions for PEG flushing/dressing change in support of verbal instruction.   I provided/described contents of Start of Care Bolus Feeding Kit (2 60 cc syringes, 1 box 4x4 drainage sponges, 1 package mesh briefs, 1 roll paper tape, 1 case Osmolite 1.5).  He voiced understanding he is to start using Osmolite per guidance of Nutrition. He understands I will be available for ongoing PEG support. Provided barium sulfate prep which I obtained from [facility] IR and reviewed instructions.    [name] RN, BSN, OCN Head & Neck Oncology Nurse Navigator [facility] at [facility] Phone # [phone]  Fax # [phone]

## 2024-03-12 NOTE — Telephone Encounter (Signed)
 8/13 Patient requested copy of his medical record to be pick up by him on tomorrow/he will mail to [employer] [facility] [place], [place].  Medical Record Release Form already signed and scanned in chart.

## 2024-03-13 ENCOUNTER — Ambulatory Visit

## 2024-03-13 ENCOUNTER — Encounter: Admitting: Dietician

## 2024-03-13 ENCOUNTER — Ambulatory Visit (HOSPITAL_COMMUNITY)
Admission: RE | Admit: 2024-03-13 | Discharge: 2024-03-13 | Disposition: A | Discharge status: Home / Self Care | Source: Ambulatory Visit | Attending: Radiation Oncology | Admitting: Radiation Oncology

## 2024-03-13 ENCOUNTER — Encounter (HOSPITAL_COMMUNITY): Payer: Self-pay

## 2024-03-13 ENCOUNTER — Other Ambulatory Visit: Payer: Self-pay

## 2024-03-13 VITALS — BP 113/59 | HR 116 | Temp 98.5°F | Resp 18 | Ht 69.0 in

## 2024-03-13 DIAGNOSIS — C099 Malignant neoplasm of tonsil, unspecified: Secondary | ICD-10-CM | POA: Diagnosis present

## 2024-03-13 HISTORY — PX: IR GASTROSTOMY TUBE MOD SED: IMG625

## 2024-03-13 LAB — CBC WITH DIFFERENTIAL/PLATELET
Abs Immature Granulocytes: 0.02 K/uL (ref 0.00–0.07)
Basophils Absolute: 0 K/uL (ref 0.0–0.1)
Basophils Relative: 0 %
Eosinophils Absolute: 0.2 K/uL (ref 0.0–0.5)
Eosinophils Relative: 3 %
HCT: 42.6 % (ref 39.0–52.0)
Hemoglobin: 14 g/dL (ref 13.0–17.0)
Immature Granulocytes: 0 %
Lymphocytes Relative: 10 %
Lymphs Abs: 0.5 K/uL — ABNORMAL LOW (ref 0.7–4.0)
MCH: 29.1 pg (ref 26.0–34.0)
MCHC: 32.9 g/dL (ref 30.0–36.0)
MCV: 88.6 fL (ref 80.0–100.0)
Monocytes Absolute: 0.6 K/uL (ref 0.1–1.0)
Monocytes Relative: 12 %
Neutro Abs: 3.9 K/uL (ref 1.7–7.7)
Neutrophils Relative %: 75 %
Platelets: 242 K/uL (ref 150–400)
RBC: 4.81 MIL/uL (ref 4.22–5.81)
RDW: 12 % (ref 11.5–15.5)
WBC: 5.2 K/uL (ref 4.0–10.5)
nRBC: 0 % (ref 0.0–0.2)

## 2024-03-13 LAB — PROTIME-INR
INR: 1 (ref 0.8–1.2)
Prothrombin Time: 14.2 s (ref 11.4–15.2)

## 2024-03-13 MED ORDER — DIPHENHYDRAMINE HCL 50 MG/ML IJ SOLN
INTRAMUSCULAR | Status: AC
  Filled 2024-03-13: qty 1

## 2024-03-13 MED ORDER — MIDAZOLAM HCL 2 MG/2ML IJ SOLN
INTRAMUSCULAR | Status: AC | PRN
  Administered 2024-03-13: 1 mg via INTRAVENOUS

## 2024-03-13 MED ORDER — MIDAZOLAM HCL 2 MG/2ML IJ SOLN
INTRAMUSCULAR | Status: AC
  Filled 2024-03-13: qty 2

## 2024-03-13 MED ORDER — FENTANYL CITRATE (PF) 100 MCG/2ML IJ SOLN
INTRAMUSCULAR | Status: AC
Start: 2024-03-13 — End: 2024-03-13
  Filled 2024-03-13: qty 2

## 2024-03-13 MED ORDER — LIDOCAINE HCL 1 % IJ SOLN
INTRAMUSCULAR | Status: AC
  Filled 2024-03-13: qty 20

## 2024-03-13 MED ORDER — IOHEXOL 300 MG/ML  SOLN
50.0000 mL | Freq: Once | INTRAMUSCULAR | Status: AC | PRN
  Administered 2024-03-13: 20 mL

## 2024-03-13 MED ORDER — DIPHENHYDRAMINE HCL 50 MG/ML IJ SOLN
INTRAMUSCULAR | Status: AC | PRN
  Administered 2024-03-13: 50 mg via INTRAVENOUS

## 2024-03-13 MED ORDER — FENTANYL CITRATE (PF) 100 MCG/2ML IJ SOLN
INTRAMUSCULAR | Status: AC | PRN
  Administered 2024-03-13: 50 ug via INTRAVENOUS

## 2024-03-13 MED ORDER — ONDANSETRON HCL 4 MG/2ML IJ SOLN
4.0000 mg | Freq: Once | INTRAMUSCULAR | Status: AC
  Administered 2024-03-13: 4 mg via INTRAVENOUS

## 2024-03-13 MED ORDER — CEFAZOLIN SODIUM-DEXTROSE 2-4 GM/100ML-% IV SOLN
INTRAVENOUS | Status: AC
  Filled 2024-03-13: qty 100

## 2024-03-13 MED ORDER — LIDOCAINE VISCOUS HCL 2 % MT SOLN
OROMUCOSAL | Status: AC
  Filled 2024-03-13: qty 15

## 2024-03-13 MED ORDER — SODIUM CHLORIDE 0.9 % IV SOLN: INTRAVENOUS | Status: DC

## 2024-03-13 MED ORDER — HYDROCODONE-ACETAMINOPHEN 5-325 MG PO TABS: 1.0000 | ORAL_TABLET | Freq: Four times a day (QID) | ORAL | 0 refills | Status: AC | PRN

## 2024-03-13 MED ORDER — ONDANSETRON HCL 4 MG/2ML IJ SOLN
INTRAMUSCULAR | Status: AC
  Filled 2024-03-13: qty 2

## 2024-03-13 MED ORDER — CEFAZOLIN SODIUM-DEXTROSE 2-4 GM/100ML-% IV SOLN
INTRAVENOUS | Status: AC | PRN
  Administered 2024-03-13: 2 g via INTRAVENOUS

## 2024-03-13 MED ORDER — CEFAZOLIN SODIUM-DEXTROSE 2-4 GM/100ML-% IV SOLN: 2.0000 g | INTRAVENOUS | Status: AC

## 2024-03-13 MED ORDER — GLUCAGON HCL RDNA (DIAGNOSTIC) 1 MG IJ SOLR
INTRAMUSCULAR | Status: AC
  Filled 2024-03-13: qty 1

## 2024-03-13 MED ORDER — LIDOCAINE HCL 1 % IJ SOLN
20.0000 mL | Freq: Once | INTRAMUSCULAR | Status: AC
  Administered 2024-03-13: 10 mL via INTRADERMAL

## 2024-03-13 NOTE — Discharge Instructions (Addendum)
 Ok to begin using tube tomorrow morning (8/15).   Pain medication called into your perferred pharmacy can be used as needed for recovery.   Do not drive while taking medication.  Please call Interventional Radiology clinic [phone] with any questions or concerns.  You may remove your dressing and shower tomorrow.  After the procedure, it is common to have: Mild pain in your abdomen A small amount of blood-colored fluid leaking from the site of your gastrostomy tube (G-tube)   DO NOT Use your G-tube for at least 24 hours from its insertion.  Follow these instructions at home:  Medication: Do not use Aspirin  or ibuprofen products, such as Advil or Motrin, as it may increase bleeding.  You may resume your usual medications as ordered by your doctor. If your doctor prescribed antibiotics, take them as directed. Do not stop taking them just because you feel better. You need to take the full course of antibiotics.  Care of the procedure site  Wash your hands with soap and water for at least 20 seconds Remove the dressing (if there is one) that is between the skin and the tube Check the area where the tube enters the skin. Check daily for problems such as: Redness, rash, or irritation Swelling Pus-like drainage Extra skin growth Moisten the cotton swab or gauze with the saline solution or with a soap-and-water mixture. Gently clean around the insertion site. Remove any drainage or crusted material. When the G-tube is first put in, a normal saline solution or water can be used to clean the skin After the skin around the tube has healed, mild soap and water may be used Apply a dressing (if there should be one) between the skin and the tube If the tube comes out: Cover the opening with a clean dressing and tape Get help right away Do not pull or put tension on the tube  Activity Do not take baths, swim, or use a hot tub until your health care provider approves Keep all follow-up  visits as told by your doctor  Contact a health care provider if: You develop constipation or a fever A large amount of fluid or mucus-like liquid is leaking from the tube Skin or scar tissue appears to be growing where the tube enters the skin The length of tube from the insertion site to the G-tube gets longer  Get help right away if: You have severe pain, tenderness, or bloating in the abdomen Nausea or vomiting Trouble breathing or shortness of breath Any of these problems happen in the area where the tube enters the skin: Redness, irritation, swelling, or soreness Pus-like discharge A bad smell The tube is clogged and cannot be flushed The tube comes out. The tube will need to be put back in within 4 hours

## 2024-03-13 NOTE — Procedures (Signed)
Interventional Radiology Procedure Note  Procedure: Percutaneous gastrostomy tube placement  Complications: None  Estimated Blood Loss: < 10 mL  Findings: 20 Fr bumper retention gastrostomy tube placed with tip in body of stomach. OK to use in 24 hours.  Sarajane Fambrough T. Reyden Smith, M.D Pager:  [phone]   

## 2024-03-13 NOTE — Sedation Documentation (Addendum)
 RN Aragon Scarantino pulled 4 mg Versed  and 200 mcg Fentanyl  in IR room. Pt. Received 4 mg Versed  and 100 mcg Fentanyl  throughout the procedure. RN Lyana Asbill returned 100mcg Fentanyl  back to IR room pysix.

## 2024-03-13 NOTE — H&P (Signed)
 Chief Complaint: Patient was seen in consultation today for Squamous cell carcinoma of left tonsil   Referring Physician(s): Izell Domino  Supervising Physician: Luverne Aran  Patient Status: [facility] - Out-pt  History of Present Illness: Douglas Hester is a 63 y.o. male with past medical history of chronic GERD, HTN presents after recent diagnosis of Squamous cell carcinoma of left tonsil who is referred to [facility] Radiology for percutaneous gastrostomy tube placement.  Patient case reviewed and approved by Dr. Karalee.  He presents for procedure today in his usual state of health.   He has been NPO. He has arranged transportation and post-procedure care.   He is FULL code.    Past Medical History:  Diagnosis Date   Anxiety    Chronic GERD    Heart murmur    Hypertension    Tachycardia     Past Surgical History:  Procedure Laterality Date   CHOLECYSTECTOMY N/A 10/11/2018   Procedure: LAPAROSCOPIC CHOLECYSTECTOMY WITH INTRAOPERATIVE CHOLANGIOGRAM;  Surgeon: Curvin Deward MOULD, MD;  Location: [facility] ORS;  Service: General;  Laterality: N/A;   COLONOSCOPY  ~ 2014   done by GI in [place]. Polyps were removed. Patient not aware what type of polyps these were   DIRECT LARYNGOSCOPY N/A 01/28/2024   Procedure: LARYNGOSCOPY, DIRECT WITH BIOPSIES;  Surgeon: Tobie Eldora NOVAK, MD;  Location: [facility] OR;  Service: ENT;  Laterality: N/A;   ESOPHAGOGASTRODUODENOSCOPY  early 1990s   MD found ulcer   INGUINAL HERNIA REPAIR Left ~ 2010   with Mesh.    VASECTOMY      Allergies: Patient has no known allergies.  Medications: Prior to Admission medications   Medication Sig Start Date End Date Taking? Authorizing Provider  acetaminophen  (TYLENOL ) 500 MG tablet Take 500-1,000 mg by mouth 2 (two) times daily as needed (pain.).   Yes [provider]  buPROPion  (WELLBUTRIN  XL) 150 MG 24 hr tablet Take 1 tablet (150 mg total) by mouth daily. Bridge to patient appt 12/16 Patient taking  differently: Take 150 mg by mouth every evening. Bridge to patient appt 12/16 01/14/24  Yes Arfeen, Leni DASEN, MD  lamoTRIgine  (LAMICTAL ) 150 MG tablet Take 1 tablet (150 mg total) by mouth daily. Patient taking differently: Take 150 mg by mouth every evening. 01/14/24  Yes Arfeen, Leni DASEN, MD  lidocaine  (XYLOCAINE ) 2 % solution Use as directed 15 mLs in the mouth or throat as needed for mouth pain. Mix 1 part 2%viscous lidocaine ,1part H2O.Swish and/or swallow 10ml of this mixture,31min before meals and at bedtime, up to QID 03/05/24  Yes Wyatt Leeroy HERO, PA-C  Melatonin 5 MG TABS Take 5 mg by mouth at bedtime as needed (sleep).   Yes [provider]  metoprolol  succinate (TOPROL -XL) 25 MG 24 hr tablet Take 25 mg by mouth every evening. 09/18/22  Yes [provider]  mirtazapine  (REMERON ) 15 MG tablet Take 1 tablet (15 mg total) by mouth as needed (for sleep). 01/14/24 01/13/25 Yes Arfeen, Leni DASEN, MD  olmesartan (BENICAR) 5 MG tablet Take 5 mg by mouth every evening.   Yes [provider]  LORazepam  (ATIVAN ) 0.5 MG tablet Take 1 tablet (0.5 mg total) by mouth as needed for anxiety. Take 1 tablet 20 minutes prior to radiation treatment. Please have a driver given sedative effects. 02/13/24   Wyatt Leeroy HERO, PA-C  oxyCODONE  (ROXICODONE ) 5 MG/5ML solution Take 5 mLs (5 mg total) by mouth every 4 (four) hours as needed for severe pain (pain score 7-10). Take w/  food. 03/10/24   Izell Domino, MD     Family History  Problem Relation Age of Onset   Lung cancer Mother    Lymphoma Father    Valvular heart disease Sister     Social History   Socioeconomic History   Marital status: Divorced    Spouse name: Not on file   Number of children: 0   Years of education: Not on file   Highest education level: Not on file  Occupational History   Not on file  Tobacco Use   Smoking status: Never   Smokeless tobacco: Never  Vaping Use   Vaping status: Never Used  Substance and Sexual Activity    Alcohol use: Yes    Comment: rare   Drug use: No   Sexual activity: Not on file  Other Topics Concern   Not on file  Social History Narrative   Not on file   Social Drivers of Health   Financial Resource Strain: Not on file  Food Insecurity: No Food Insecurity (02/21/2024)   Hunger Vital Sign    Worried About Running Out of Food in the Last Year: Never true    Ran Out of Food in the Last Year: Never true  Transportation Needs: No Transportation Needs (02/06/2024)   PRAPARE - Administrator, Civil Service (Medical): No    Lack of Transportation (Non-Medical): No  Physical Activity: Not on file  Stress: Not on file  Social Connections: Not on file     Review of Systems: A 12 point ROS discussed and pertinent positives are indicated in the HPI above.  All other systems are negative.  Review of Systems  Constitutional:  Negative for fatigue and fever.  HENT:  Positive for trouble swallowing.   Respiratory:  Negative for cough and shortness of breath.   Cardiovascular:  Negative for chest pain.  Gastrointestinal:  Negative for abdominal pain and nausea.  Psychiatric/Behavioral:  Negative for behavioral problems and confusion.     Vital Signs: BP 119/78   Pulse (!) 111   Temp 98.2 F (36.8 C) (Oral)   Resp 18   Ht 5' 9 (1.753 m)   SpO2 98%   BMI 31.87 kg/m   Physical Exam Vitals and nursing note reviewed.  Constitutional:      General: He is not in acute distress.    Appearance: Normal appearance. He is not ill-appearing.  HENT:     Mouth/Throat:     Mouth: Mucous membranes are moist.     Pharynx: Oropharynx is clear.  Cardiovascular:     Rate and Rhythm: Normal rate and regular rhythm.  Abdominal:     General: Abdomen is flat. There is no distension.     Palpations: Abdomen is soft.  Neurological:     General: No focal deficit present.     Mental Status: He is alert and oriented to person, place, and time. Mental status is at baseline.   Psychiatric:        Mood and Affect: Mood normal.        Behavior: Behavior normal.        Thought Content: Thought content normal.        Judgment: Judgment normal.      MD Evaluation Airway: WNL Heart: WNL Abdomen: WNL Chest/ Lungs: WNL ASA  Classification: 3 Mallampati/Airway Score: Two   Imaging: No results found.  Labs:  CBC: Recent Labs    01/03/24 1623 01/23/24 1200 03/13/24 0954  WBC 7.6 6.5  5.2  HGB 14.8 14.4 14.0  HCT 44.9 44.6 42.6  PLT 256 262 242    COAGS: Recent Labs    03/13/24 0954  INR 1.0    BMP: Recent Labs    01/03/24 1623 01/23/24 1200 03/10/24 1227  NA 140 140 136  K 4.8 4.4 4.6  CL 104 105 100  CO2 27 28 30   GLUCOSE 92 95 83  BUN 13 7* 19  CALCIUM 9.5 9.1 9.6  CREATININE 0.95 0.92 0.95  GFRNONAA >60 >60 >60    LIVER FUNCTION TESTS: Recent Labs    01/03/24 1623  BILITOT 0.3  AST 21  ALT 35  ALKPHOS 90  PROT 7.3  ALBUMIN 4.3    TUMOR MARKERS: No results for input(s): AFPTM, CEA, CA199, CHROMGRNA in the last 8760 hours.  Assessment and Plan: Patient with past medical history of Squamous cell carcinoma of left tonsil  presents with complaint of poor oral intake.  IR consulted for gastrostomy tube placement at the request of Dr. Izell. Case reviewed by Dr. Karalee who approves patient for procedure.  Patient presents today in their usual state of health.  He has been NPO and is not currently on blood thinners.   Norco 5mg  #12 sent to preferred pharmacy in [place].   Risks and benefits image guided gastrostomy tube placement was discussed with the patient including, but not limited to the need for a barium enema during the procedure, bleeding, infection, peritonitis and/or damage to adjacent structures.  All of the patient's questions were answered, patient is agreeable to proceed.  Consent signed and in chart.   Thank you for this interesting consult.  I greatly enjoyed meeting Douglas Hester  and look forward to participating in their care.  A copy of this report was sent to the requesting provider on this date.  Electronically Signed: Zeanna Sunde Sue-Ellen Natesha Hassey, PA 03/13/2024, 11:31 AM   I spent a total of  30 Minutes   in face to face in clinical consultation, greater than 50% of which was counseling/coordinating care for Squamous cell carcinoma of left tonsil

## 2024-03-14 ENCOUNTER — Inpatient Hospital Stay

## 2024-03-14 ENCOUNTER — Telehealth: Payer: Self-pay

## 2024-03-14 ENCOUNTER — Ambulatory Visit
Admission: RE | Admit: 2024-03-14 | Discharge: 2024-03-14 | Disposition: A | Discharge status: Home / Self Care | Source: Ambulatory Visit | Attending: Radiation Oncology | Admitting: Radiation Oncology

## 2024-03-14 ENCOUNTER — Inpatient Hospital Stay: Admitting: Dietician

## 2024-03-14 ENCOUNTER — Other Ambulatory Visit: Payer: Self-pay

## 2024-03-14 VITALS — BP 120/72 | HR 108 | Temp 98.4°F | Resp 16

## 2024-03-14 DIAGNOSIS — Z510 Encounter for antineoplastic radiation therapy: Secondary | ICD-10-CM | POA: Diagnosis not present

## 2024-03-14 DIAGNOSIS — C099 Malignant neoplasm of tonsil, unspecified: Secondary | ICD-10-CM

## 2024-03-14 LAB — RAD ONC ARIA SESSION SUMMARY
Course Elapsed Days: 23
Plan Fractions Treated to Date: 17
Plan Prescribed Dose Per Fraction: 2 Gy
Plan Total Fractions Prescribed: 35
Plan Total Prescribed Dose: 70 Gy
Reference Point Dosage Given to Date: 34 Gy
Reference Point Session Dosage Given: 2 Gy
Session Number: 17

## 2024-03-14 MED ORDER — NUTREN 1.5 EN LIQD: ENTERAL | Status: DC

## 2024-03-14 MED ORDER — PROCHLORPERAZINE MALEATE 10 MG PO TABS
10.0000 mg | ORAL_TABLET | Freq: Once | ORAL | Status: AC
  Administered 2024-03-14: 10 mg via ORAL
  Filled 2024-03-14: qty 1

## 2024-03-14 MED ORDER — SODIUM CHLORIDE 0.9 % IV SOLN
Freq: Once | INTRAVENOUS | Status: AC
Start: 2024-03-14 — End: 2024-03-14

## 2024-03-14 MED ORDER — FAMOTIDINE IN NACL 20-0.9 MG/50ML-% IV SOLN
20.0000 mg | Freq: Once | INTRAVENOUS | Status: AC
  Administered 2024-03-14: 20 mg via INTRAVENOUS
  Filled 2024-03-14: qty 50

## 2024-03-14 MED ORDER — ACETAMINOPHEN 325 MG PO TABS
650.0000 mg | ORAL_TABLET | Freq: Once | ORAL | Status: AC
  Administered 2024-03-14: 650 mg via ORAL
  Filled 2024-03-14: qty 2

## 2024-03-14 NOTE — Telephone Encounter (Signed)
 [facility] CSW Progress Note  Clinical Social Worker attempted to contact patient by phone to follow-up on emotional support as patient continues through treatment. Patient reported to [facility] with intense headaches. CSW unable to reach patient by telephone, will attempt again next week.    Follow Up Plan:  CSW will follow-up with patient by phone     Lizbeth Sprague, LCSW Clinical Social Worker [employer]

## 2024-03-14 NOTE — Progress Notes (Signed)
 Pt reported to [facility] today for IVFs. Pt expressed c/o 9/10 headache and nausea. Pt informed this RN that Zofran  makes his headache worse. This RN reached out to radiation and made them aware. This RN received V/O for Tylenol  650 mg PO, Pepcid  20 mg IV and Compazine  10 mg PO to be given in [facility] today.

## 2024-03-14 NOTE — Progress Notes (Signed)
 Nutrition Follow-up:  Pt with SCC of oropharynx, p16+. He is receiving radiation therapy under the care of Dr. Izell. First RT 7/23   S/p PEG 8/14  Met with patient in [facility]. He is receiving IVF today. Patient reports not feeling well today. He has a headache and feeling nauseous. Antiemetics + pepcid  added to infusion. Abdomen is very tender from PEG placement. Patient reports worsening taste. Says he has not eaten solids in the last week. Ensure shakes are tasting bad. He sips on water through out the day, however this is becoming more challenging due to taste. Patient agreeable to try bolus feed. Requested presence of sister who is in the waiting area.   Medications: reviewed   Labs: 8/11 - reviewed   Anthropometrics: No new wts. Pt 208.4 lb on 8/11   Estimated Energy Needs  Kcals: 7349-7064 Protein: 123-142 Fluid: >2.6 L  NUTRITION DIAGNOSIS: Inadequate oral intake continues - initiating tube feedings   INTERVENTION:  Bolus education completed with patient and sister. Teach back method used. Pt tolerated half carton Osmolite 1.5 with 60 ml water flush before and after Agreeable to give other half + one additional carton today - recommend increasing by one carton daily until goal - noted below (pt provided written schedule + handout with bolus edu as needed for review)  Oral intake encouraged as tolerated   Goal: Anticipate long term enteral needs 7 cartons Nutren 1.5/equivalent split over 4 feedings Flush tube with 60 ml water before and after each bolus Give additional 3.5 cups water (830 ml water) via PEG/po to meet hydration needs   Provides 2625 kcal, 119 g, 1337 ml free water (2647 ml total water). 1750 ml/day meets >95% est needs, 100% DRI    MONITORING, EVALUATION, GOAL: wt trends, intake, TF    NEXT VISIT: Wednesday August 20 after RT

## 2024-03-14 NOTE — Patient Instructions (Signed)
Nausea, Adult Nausea is feeling like you may vomit. Feeling like you may vomit is usually not serious, but it may be an early sign of a more serious medical problem. Vomiting is when stomach contents forcefully come out of your mouth. If you vomit, or if you are not able to drink enough fluids, you may not have enough water in your body (get dehydrated). If you do not have enough water in your body, you may: Feel tired. Feel thirsty. Have a dry mouth. Have cracked lips. Pee (urinate) less often. Older adults and people who have other diseases or a weak body defense system (immune system) have a higher risk of not having enough water in the body. The main goals of treating this condition are: To relieve your nausea. To ensure your nausea occurs less often. To prevent vomiting and losing too much fluid. Follow these instructions at home: Watch your symptoms for any changes. Tell your doctor about them. Eating and drinking     Take an ORS (oral rehydration solution). This is a drink that is sold at pharmacies and stores. Drink clear fluids in small amounts as you are able. These include: Water. Ice chips. Fruit juice that has water added (diluted fruit juice). Low-calorie sports drinks. Eat bland, easy-to-digest foods in small amounts as you are able, such as: Bananas. Applesauce. Rice. Low-fat (lean) meats. Toast. Crackers. Avoid drinking fluids that have a lot of sugar or caffeine in them. This includes energy drinks, sports drinks, and soda. Avoid alcohol. Avoid spicy or fatty foods. General instructions Take over-the-counter and prescription medicines only as told by your doctor. Rest at home while you get better. Drink enough fluid to keep your pee (urine) pale yellow. Take slow and deep breaths when you feel like you may vomit. Avoid food or things that have strong smells. Wash your hands often with soap and water for at least 20 seconds. If you cannot use soap and water,  use hand sanitizer. Make sure that everyone in your home washes their hands well and often. Keep all follow-up visits. Contact a doctor if: You feel worse. You feel like you may vomit and this lasts for more than 2 days. You vomit. You are not able to drink fluids without vomiting. You have new symptoms. You have a fever. You have a headache. You have muscle cramps. You have a rash. You have pain while peeing. You feel light-headed or dizzy. Get help right away if: You have pain in your chest, neck, arm, or jaw. You feel very weak or you faint. You have vomit that is bright red or looks like coffee grounds. You have bloody or black poop (stools) or poop that looks like tar. You have a very bad headache, a stiff neck, or both. You have very bad pain, cramping, or bloating in your belly (abdomen). You have trouble breathing or you are breathing very quickly. Your heart is beating very quickly. Your skin feels cold and clammy. You feel confused. You have signs of losing too much water in your body, such as: Dark pee, very little pee, or no pee. Cracked lips. Dry mouth. Sunken eyes. Sleepiness. Weakness. These symptoms may be an emergency. Get help right away. Call 911. Do not wait to see if the symptoms will go away. Do not drive yourself to the hospital. Summary Nausea is feeling like you are about vomit. If you vomit, or if you are not able to drink enough fluids, you may not have enough water in   your body (get dehydrated). Eat and drink what your doctor tells you. Take over-the-counter and prescription medicines only as told by your doctor. Contact a doctor right away if your symptoms get worse or you have new symptoms. Keep all follow-up visits. This information is not intended to replace advice given to you by your health care provider. Make sure you discuss any questions you have with your health care provider. Document Revised: 01/21/2021 Document Reviewed:  01/21/2021 Elsevier Patient Education  2024 [employer] Inc[employer]  

## 2024-03-17 ENCOUNTER — Ambulatory Visit
Admission: RE | Admit: 2024-03-17 | Discharge: 2024-03-17 | Disposition: A | Discharge status: Home / Self Care | Source: Ambulatory Visit | Attending: Radiation Oncology | Admitting: Radiation Oncology

## 2024-03-17 ENCOUNTER — Other Ambulatory Visit: Payer: Self-pay | Admitting: Radiation Oncology

## 2024-03-17 ENCOUNTER — Other Ambulatory Visit: Payer: Self-pay

## 2024-03-17 DIAGNOSIS — Z510 Encounter for antineoplastic radiation therapy: Secondary | ICD-10-CM | POA: Diagnosis not present

## 2024-03-17 DIAGNOSIS — C099 Malignant neoplasm of tonsil, unspecified: Secondary | ICD-10-CM

## 2024-03-17 LAB — RAD ONC ARIA SESSION SUMMARY
Course Elapsed Days: 26
Plan Fractions Treated to Date: 18
Plan Prescribed Dose Per Fraction: 2 Gy
Plan Total Fractions Prescribed: 35
Plan Total Prescribed Dose: 70 Gy
Reference Point Dosage Given to Date: 36 Gy
Reference Point Session Dosage Given: 2 Gy
Session Number: 18

## 2024-03-17 MED ORDER — PROMETHAZINE HCL 25 MG PO TABS: 25.0000 mg | ORAL_TABLET | Freq: Four times a day (QID) | ORAL | 2 refills | Status: DC | PRN

## 2024-03-18 ENCOUNTER — Other Ambulatory Visit: Payer: Self-pay

## 2024-03-18 ENCOUNTER — Encounter (HOSPITAL_COMMUNITY): Payer: Self-pay

## 2024-03-18 ENCOUNTER — Emergency Department (HOSPITAL_COMMUNITY)

## 2024-03-18 ENCOUNTER — Ambulatory Visit

## 2024-03-18 ENCOUNTER — Observation Stay (HOSPITAL_COMMUNITY)
Admission: EM | Admit: 2024-03-18 | Discharge: 2024-03-20 | Disposition: A | Discharge status: Home / Self Care | Source: Ambulatory Visit | Attending: Internal Medicine | Admitting: Internal Medicine

## 2024-03-18 ENCOUNTER — Telehealth: Payer: Self-pay | Admitting: Radiation Oncology

## 2024-03-18 DIAGNOSIS — K5641 Fecal impaction: Secondary | ICD-10-CM | POA: Insufficient documentation

## 2024-03-18 DIAGNOSIS — E440 Moderate protein-calorie malnutrition: Secondary | ICD-10-CM | POA: Insufficient documentation

## 2024-03-18 DIAGNOSIS — L03311 Cellulitis of abdominal wall: Principal | ICD-10-CM | POA: Insufficient documentation

## 2024-03-18 DIAGNOSIS — K922 Gastrointestinal hemorrhage, unspecified: Secondary | ICD-10-CM | POA: Diagnosis present

## 2024-03-18 LAB — COMPREHENSIVE METABOLIC PANEL WITH GFR
ALT: 43 U/L (ref 0–44)
AST: 28 U/L (ref 15–41)
Albumin: 3.6 g/dL (ref 3.5–5.0)
Alkaline Phosphatase: 71 U/L (ref 38–126)
Anion gap: 9 (ref 5–15)
BUN: 13 mg/dL (ref 8–23)
CO2: 24 mmol/L (ref 22–32)
Calcium: 9.1 mg/dL (ref 8.9–10.3)
Chloride: 99 mmol/L (ref 98–111)
Creatinine, Ser: 0.83 mg/dL (ref 0.61–1.24)
GFR, Estimated: >60 mL/min (ref >60)
Glucose, Bld: 130 mg/dL — ABNORMAL HIGH (ref 70–99)
Potassium: 4.2 mmol/L (ref 3.5–5.1)
Sodium: 132 mmol/L — ABNORMAL LOW (ref 135–145)
Total Bilirubin: 0.6 mg/dL (ref 0.0–1.2)
Total Protein: 7.2 g/dL (ref 6.5–8.1)

## 2024-03-18 LAB — CBC
HCT: 40.7 % (ref 39.0–52.0)
Hemoglobin: 12.9 g/dL — ABNORMAL LOW (ref 13.0–17.0)
MCH: 27.9 pg (ref 26.0–34.0)
MCHC: 31.7 g/dL (ref 30.0–36.0)
MCV: 88.1 fL (ref 80.0–100.0)
Platelets: 237 K/uL (ref 150–400)
RBC: 4.62 MIL/uL (ref 4.22–5.81)
RDW: 12.4 % (ref 11.5–15.5)
WBC: 7.1 K/uL (ref 4.0–10.5)
nRBC: 0 % (ref 0.0–0.2)

## 2024-03-18 LAB — URINALYSIS, ROUTINE W REFLEX MICROSCOPIC
Bilirubin Urine: NEGATIVE
Glucose, UA: NEGATIVE mg/dL
Hgb urine dipstick: NEGATIVE
Ketones, ur: 5 mg/dL — AB
Leukocytes,Ua: NEGATIVE
Nitrite: NEGATIVE
Protein, ur: NEGATIVE mg/dL
Specific Gravity, Urine: >1.046 — ABNORMAL HIGH (ref 1.005–1.030)
pH: 5 (ref 5.0–8.0)

## 2024-03-18 LAB — HEMOGLOBIN AND HEMATOCRIT, BLOOD
HCT: 40 % (ref 39.0–52.0)
Hemoglobin: 12.6 g/dL — ABNORMAL LOW (ref 13.0–17.0)

## 2024-03-18 LAB — POC OCCULT BLOOD, ED: Fecal Occult Bld: POSITIVE — AB

## 2024-03-18 LAB — TYPE AND SCREEN
ABO/RH(D): O NEG
Antibody Screen: NEGATIVE

## 2024-03-18 LAB — LIPASE, BLOOD: Lipase: 29 U/L (ref 11–51)

## 2024-03-18 LAB — ABO/RH: ABO/RH(D): O NEG

## 2024-03-18 MED ORDER — ALBUTEROL SULFATE (2.5 MG/3ML) 0.083% IN NEBU
2.5000 mg | INHALATION_SOLUTION | RESPIRATORY_TRACT | Status: DC | PRN
Start: 2024-03-18 — End: 2024-03-20

## 2024-03-18 MED ORDER — LIDOCAINE HCL URETHRAL/MUCOSAL 2 % EX GEL
1.0000 | Freq: Once | CUTANEOUS | Status: AC
  Administered 2024-03-18: 1 via TOPICAL
  Filled 2024-03-18: qty 11

## 2024-03-18 MED ORDER — VANCOMYCIN HCL IN DEXTROSE 1-5 GM/200ML-% IV SOLN: 1000.0000 mg | Freq: Once | INTRAVENOUS | Status: DC

## 2024-03-18 MED ORDER — POLYETHYLENE GLYCOL 3350 17 G PO PACK: 17.0000 g | PACK | Freq: Two times a day (BID) | ORAL | Status: DC | PRN

## 2024-03-18 MED ORDER — LORAZEPAM 0.5 MG PO TABS: 0.5000 mg | ORAL_TABLET | Freq: Four times a day (QID) | ORAL | Status: DC | PRN

## 2024-03-18 MED ORDER — LAMOTRIGINE 25 MG PO TABS
150.0000 mg | ORAL_TABLET | Freq: Every evening | ORAL | Status: DC
  Filled 2024-03-18 (×3): qty 2

## 2024-03-18 MED ORDER — HYDROMORPHONE HCL 1 MG/ML IJ SOLN
1.0000 mg | Freq: Once | INTRAMUSCULAR | Status: AC
  Administered 2024-03-18: 1 mg via INTRAVENOUS
  Filled 2024-03-18: qty 1

## 2024-03-18 MED ORDER — FLEET ENEMA RE ENEM
1.0000 | ENEMA | Freq: Once | RECTAL | Status: AC
  Administered 2024-03-18: 1 via RECTAL
  Filled 2024-03-18: qty 1

## 2024-03-18 MED ORDER — ACETAMINOPHEN 325 MG PO TABS
650.0000 mg | ORAL_TABLET | Freq: Four times a day (QID) | ORAL | Status: DC | PRN
  Administered 2024-03-18 – 2024-03-19 (×2): 650 mg via ORAL
  Filled 2024-03-18 (×2): qty 2

## 2024-03-18 MED ORDER — OXYCODONE HCL 5 MG PO TABS: 5.0000 mg | ORAL_TABLET | ORAL | Status: DC | PRN

## 2024-03-18 MED ORDER — ACETAMINOPHEN 650 MG RE SUPP: 650.0000 mg | Freq: Four times a day (QID) | RECTAL | Status: DC | PRN

## 2024-03-18 MED ORDER — SODIUM CHLORIDE 0.9 % IV BOLUS
1000.0000 mL | Freq: Once | INTRAVENOUS | Status: AC
  Administered 2024-03-18: 1000 mL via INTRAVENOUS

## 2024-03-18 MED ORDER — ONDANSETRON HCL 4 MG PO TABS: 4.0000 mg | ORAL_TABLET | Freq: Four times a day (QID) | ORAL | Status: DC | PRN

## 2024-03-18 MED ORDER — ONDANSETRON HCL 4 MG/2ML IJ SOLN: 4.0000 mg | Freq: Four times a day (QID) | INTRAMUSCULAR | Status: DC | PRN

## 2024-03-18 MED ORDER — ONDANSETRON HCL 4 MG/2ML IJ SOLN
4.0000 mg | Freq: Once | INTRAMUSCULAR | Status: AC
  Administered 2024-03-18: 4 mg via INTRAVENOUS
  Filled 2024-03-18: qty 2

## 2024-03-18 MED ORDER — PIPERACILLIN-TAZOBACTAM 3.375 G IVPB 30 MIN
3.3750 g | Freq: Once | INTRAVENOUS | Status: AC
  Administered 2024-03-18: 3.375 g via INTRAVENOUS
  Filled 2024-03-18: qty 50

## 2024-03-18 MED ORDER — SENNOSIDES-DOCUSATE SODIUM 8.6-50 MG PO TABS
1.0000 | ORAL_TABLET | Freq: Two times a day (BID) | ORAL | Status: DC
  Administered 2024-03-18 – 2024-03-20 (×4): 1 via ORAL
  Filled 2024-03-18 (×4): qty 1

## 2024-03-18 MED ORDER — BUPROPION HCL ER (XL) 150 MG PO TB24
150.0000 mg | ORAL_TABLET | Freq: Every evening | ORAL | Status: DC
  Filled 2024-03-18 (×3): qty 1

## 2024-03-18 MED ORDER — SULFAMETHOXAZOLE-TRIMETHOPRIM 800-160 MG PO TABS
1.0000 | ORAL_TABLET | Freq: Two times a day (BID) | ORAL | Status: DC
  Administered 2024-03-18 – 2024-03-20 (×4): 1 via ORAL
  Filled 2024-03-18 (×4): qty 1

## 2024-03-18 MED ORDER — IOHEXOL 300 MG/ML  SOLN
100.0000 mL | Freq: Once | INTRAMUSCULAR | Status: AC | PRN
  Administered 2024-03-18: 100 mL via INTRAVENOUS

## 2024-03-18 MED ORDER — VANCOMYCIN HCL 2000 MG/400ML IV SOLN
2000.0000 mg | Freq: Once | INTRAVENOUS | Status: AC
  Administered 2024-03-18: 2000 mg via INTRAVENOUS
  Filled 2024-03-18: qty 400

## 2024-03-18 MED ORDER — MIRTAZAPINE 15 MG PO TABS
15.0000 mg | ORAL_TABLET | ORAL | Status: DC | PRN
Start: 2024-03-18 — End: 2024-03-20

## 2024-03-18 NOTE — ED Notes (Addendum)
 Pt has passed hard stool in bedside commode after retaining enema for approx .   Pt passed a small amount of urine, but still has not had a full urine void.

## 2024-03-18 NOTE — Telephone Encounter (Signed)
 8/19 @ 8:54 am Left voicemail with patient, his copy of medical record has not been pick up.  Waiting on confirmation, if still need a copy.

## 2024-03-18 NOTE — Progress Notes (Signed)
 Oncology Nurse Navigator Documentation   I received a call from Douglas Hester's brother Douglas Hester this morning reporting that he was having increased tenderness at his PEG site with blood tinged drainage noted around the stoma and increased firmness to the left side of the PEG. He also reports several episodes of rectal bleeding. After consulting with Dr. Izell who recommended he present to the ED for evaluation I called Douglas Hester back with Dr. Charisse advice and he voiced his understanding to go to the [facility] ED for evaluation.   [name] RN, BSN, OCN Head & Neck Oncology Nurse Navigator [facility] Phone # [phone]  Fax # [phone]

## 2024-03-18 NOTE — ED Triage Notes (Signed)
 Pt reports with bright red rectal bleeding since today. Pt has a hemorrhoid that is hurting but he states that it never bleeds this much. Pt has redness around his feeding tube.

## 2024-03-18 NOTE — ED Provider Triage Note (Signed)
 Emergency Medicine Provider Triage Evaluation Note  Douglas Hester , a 63 y.o. male  was evaluated in triage.  Pt complains of redness, pain and drainage from his feeding tube site.  Additionally complains of bright bloody stool started this morning.  Has a history of hemorrhoids but states this feels distinctly different.  Is currently being treated for squamous cell carcinoma.  Review of Systems  Positive:  Negative:   Physical Exam  BP 99/70 (BP Location: Left Arm)   Pulse (!) 104   Temp 98.5 F (36.9 C) (Oral)   Resp 18   SpO2 97%  Gen:   Awake, no distress   Resp:  Normal effort  MSK:   Moves extremities without difficulty  Other:  Abd with mild erythema surround dressing , minimally tender  Medical Decision Making  Medically screening exam initiated at 10:07 AM.  Appropriate orders placed.  Douglas Hester was informed that the remainder of the evaluation will be completed by another provider, this initial triage assessment does not replace that evaluation, and the importance of remaining in the ED until their evaluation is complete.  Will begin with routine labs.  Given history of hematochezia in the setting of current malignancy will obtain CT abdomen pelvis.   Douglas [name], PA-C 03/18/24 1009

## 2024-03-18 NOTE — ED Notes (Signed)
 Pt has a Feeding Tube and can't eat his tray

## 2024-03-18 NOTE — H&P (Signed)
 History and Physical  Azarian Starace FMW:[id number] DOB: Dec 29, 1960 DOA: 03/18/2024  PCP: Katina Pfeiffer, PA-C   Chief Complaint: Cellulitis, constipation and rectal bleeding  HPI: Douglas Hester is a 63 y.o. 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  He has been tolerating radiation well, but having some dysphagia and lack of appetite, therefore G-tube was placed by IR on 8/14.  He has been using it for water and tube feeds, has still been taking his routine medications p.o.  Starting a couple days ago, he noticed redness and warmth around the G-tube site, with some associated pus drainage.  He denies any fevers, chills, nausea, vomiting.  He has also been taking oxycodone , not on a stool softener and for the last few days he has been feeling constipated, started having pain in his rectum for the last couple of days and noticed some bright red blood per rectum when trying to have a bowel movement since yesterday.  Review of Systems: Please see HPI for pertinent positives and negatives. A complete 10 system review of systems are otherwise negative.  Past Medical History:  Diagnosis Date   Anxiety    Chronic GERD    Heart murmur    Hypertension    Tachycardia    Past Surgical History:  Procedure Laterality Date   CHOLECYSTECTOMY N/A 10/11/2018   Procedure: LAPAROSCOPIC CHOLECYSTECTOMY WITH INTRAOPERATIVE CHOLANGIOGRAM;  Surgeon: Curvin Deward MOULD, MD;  Location: [facility];  Service: General;  Laterality: N/A;   COLONOSCOPY  ~ 2014   done by GI in [place]. Polyps were removed. Patient not aware what type of polyps these were   DIRECT LARYNGOSCOPY N/A 01/28/2024   Procedure: LARYNGOSCOPY, DIRECT WITH BIOPSIES;  Surgeon: Tobie Eldora NOVAK, MD;  Location: [facility] OR;  Service: ENT;  Laterality: N/A;    ESOPHAGOGASTRODUODENOSCOPY  early 1990s   MD found ulcer   INGUINAL HERNIA REPAIR Left ~ 2010   with Mesh.    IR GASTROSTOMY TUBE MOD SED  03/13/2024   VASECTOMY     Social History:  reports that he has never smoked. He has never used smokeless tobacco. He reports current alcohol use. He reports that he does not use drugs.  No Known Allergies  Family History  Problem Relation Age of Onset   Lung cancer Mother    Lymphoma Father    Valvular heart disease Sister      Prior to Admission medications   Medication Sig Start Date End Date Taking? Authorizing Provider  acetaminophen  (TYLENOL ) 500 MG tablet Take 500-1,000 mg by mouth 2 (two) times daily as needed (pain.).    [provider]  buPROPion  (WELLBUTRIN  XL) 150 MG 24 hr tablet Take 1 tablet (150 mg total) by mouth daily. Bridge to patient appt 12/16 Patient taking differently: Take 150 mg by mouth every evening. Bridge to patient appt 12/16 01/14/24   Arfeen, Leni DASEN, MD  lamoTRIgine  (LAMICTAL ) 150 MG tablet Take 1 tablet (150 mg total) by mouth daily. Patient taking differently: Take 150 mg by mouth every evening. 01/14/24   Arfeen, Leni DASEN, MD  lidocaine  (XYLOCAINE ) 2 % solution Use as directed 15 mLs in the mouth or throat as needed for mouth pain. Mix 1 part 2%viscous lidocaine ,1part H2O.Swish and/or swallow 10ml of this mixture,59min before meals and at bedtime, up to QID 03/05/24   Wyatt Leeroy HERO, PA-C  LORazepam  (ATIVAN )  0.5 MG tablet Take 1 tablet (0.5 mg total) by mouth as needed for anxiety. Take 1 tablet 20 minutes prior to radiation treatment. Please have a driver given sedative effects. 02/13/24   Wyatt Leeroy HERO, PA-C  Melatonin 5 MG TABS Take 5 mg by mouth at bedtime as needed (sleep).    [provider]  metoprolol  succinate (TOPROL -XL) 25 MG 24 hr tablet Take 25 mg by mouth every evening. 09/18/22   [provider]  mirtazapine  (REMERON ) 15 MG tablet Take 1 tablet (15 mg total) by mouth as needed (for  sleep). 01/14/24 01/13/25  Arfeen, Leni DASEN, MD  Nutritional Supplements (NUTREN 1.5) LIQD 7 cartons Nutren 1.5/equivalent split over 4 feedings Flush tube with 60 ml water before and after each bolus Give additional 3.5 cups water (830 ml water) via PEG/po to meet hydration needs  Provides 2625 kcal, 119 g, 1337 ml free water (2647 ml total water). 1750 ml/day meets >95% est needs, 100% DRI 03/14/24   Izell Domino, MD  olmesartan (BENICAR) 5 MG tablet Take 5 mg by mouth every evening.    [provider]  oxyCODONE  (ROXICODONE ) 5 MG/5ML solution Take 5 mLs (5 mg total) by mouth every 4 (four) hours as needed for severe pain (pain score 7-10). Take w/ food. 03/10/24   Izell Domino, MD  promethazine  (PHENERGAN ) 25 MG tablet Take 1 tablet (25 mg total) by mouth every 6 (six) hours as needed for nausea or vomiting. 03/17/24   Izell Domino, MD    Physical Exam: BP 104/69 (BP Location: Left Arm)   Pulse 95   Temp 98.6 F (37 C) (Oral)   Resp 16   SpO2 97%  General:  Alert, oriented, calm, in no acute distress, pleasant and cooperative, resting comfortably Cardiovascular: RRR, no murmurs or rubs, no peripheral edema  Respiratory: clear to auscultation bilaterally, no wheezes, no crackles  Abdomen: soft, nontender, nondistended, normal bowel tones heard  Skin: dry, PEG tube site with surrounding erythema, no area of fluctuance, tenderness, or drainage seen Musculoskeletal: no joint effusions, normal range of motion  Psychiatric: appropriate affect, normal speech  Neurologic: extraocular muscles intact, clear speech, moving all extremities with intact sensorium         Labs on Admission:  Basic Metabolic Panel: Recent Labs  Lab 03/18/24 0941  NA 132*  K 4.2  CL 99  CO2 24  GLUCOSE 130*  BUN 13  CREATININE 0.83  CALCIUM 9.1   Liver Function Tests: Recent Labs  Lab 03/18/24 0941  AST 28  ALT 43  ALKPHOS 71  BILITOT 0.6  PROT 7.2  ALBUMIN 3.6   No results for input(s):  LIPASE, AMYLASE in the last 168 hours. No results for input(s): AMMONIA in the last 168 hours. CBC: Recent Labs  Lab 03/13/24 0954 03/18/24 0941  WBC 5.2 7.1  NEUTROABS 3.9  --   HGB 14.0 12.9*  HCT 42.6 40.7  MCV 88.6 88.1  PLT 242 237   Cardiac Enzymes: No results for input(s): CKTOTAL, CKMB, CKMBINDEX, TROPONINI in the last 168 hours. BNP (last 3 results) No results for input(s): BNP in the last 8760 hours.  ProBNP (last 3 results) No results for input(s): PROBNP in the last 8760 hours.  CBG: No results for input(s): GLUCAP in the last 168 hours.  Radiological Exams on Admission: CT ABDOMEN PELVIS W CONTRAST Result Date: 03/18/2024 CLINICAL DATA:  Acute abdominal pain EXAM: CT ABDOMEN AND PELVIS WITH CONTRAST TECHNIQUE: Multidetector CT imaging of the abdomen  and pelvis was performed using the standard protocol following bolus administration of intravenous contrast. RADIATION DOSE REDUCTION: This exam was performed according to the departmental dose-optimization program which includes automated exposure control, adjustment of the mA and/or kV according to patient size and/or use of iterative reconstruction technique. CONTRAST:  OMNIPAQUE  IOHEXOL  300 MG/ML  SOLN COMPARISON:  CT abdomen and pelvis 04/29/2017. FINDINGS: Lower chest: No acute abnormality. Hepatobiliary: No focal liver abnormality is seen. Status post cholecystectomy. No biliary dilatation. Pancreas: Unremarkable. No pancreatic ductal dilatation or surrounding inflammatory changes. Spleen: Normal in size without focal abnormality. Adrenals/Urinary Tract: Adrenal glands are unremarkable. Kidneys are normal, without renal calculi, focal lesion, or hydronephrosis. Bladder is distended. Stomach/Bowel: Percutaneous gastrostomy tube is in place in the body of the stomach. There is no bowel obstruction, pneumatosis, free air or focal inflammation identified. The appendix is within normal limits. There is a  large amount of stool distending the rectum. Vascular/Lymphatic: No significant vascular findings are present. No enlarged abdominal or pelvic lymph nodes. Reproductive: Prostate is unremarkable. Other: There is no ascites. There is subcutaneous fat stranding surrounding the PEG tube without focal fluid collection or air. No abdominal wall hernia. There is mild presacral edema. Musculoskeletal: No fracture is seen. Degenerative changes affect the spine and hips. IMPRESSION: 1. Percutaneous gastrostomy tube in place. There is subcutaneous fat stranding surrounding the PEG tube without focal fluid collection or air. Correlate clinically for cellulitis. 2. Large amount of stool distending the rectum. 3. Distended bladder. 4. Mild presacral edema. Electronically Signed   By: Greig Pique M.D.   On: 03/18/2024 15:31   Assessment/Plan Treyten Monestime is a 63 y.o. 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    G-tube cellulitis-without fever, leukocytosis, or other evidence of sepsis.  CT and clinical exam without concern for abscess, or other complication.  Received a dose of IV vancomycin  in the emergency department. -Observation admission -P.o. Bactrim   Stool impaction-likely due to use of narcotics without stool regimen, receiving enema in the emergency department -Will start stool softener and MiraLAX  regimen  Rectal bleeding-likely hemorrhoidal in the setting of stool impaction.  Hemodynamically stable, has known external hemorrhoids -Avoid blood thinners -Trend hemoglobin every 8 hours -ER provider discussed with [facility] who will follow along  Laryngeal squamous cell carcinoma-receiving radiation with Dr. Loni -Continue home pain medication regimen  DVT prophylaxis: SCDs only    Code Status: Full  Code  Consults called: [facility] Dr. Burnette  Admission status: Observation  Time spent: 49 minutes  Bradleigh Sonnen CHRISTELLA Gail MD [facility] Pager [phone]  If 7PM-7AM, please contact night-coverage [email or web address] Password [facility]  03/18/2024, 4:22 PM

## 2024-03-18 NOTE — ED Provider Notes (Signed)
 [place] EMERGENCY DEPARTMENT AT [facility] Provider Note   CSN: [id number] Arrival date & time: 03/18/24  0930     Patient presents with: Rectal Bleeding   Douglas Hester is a 63 y.o. male history of squamous cell cancer on radiation, recent G-tube placement, here presenting with erythema around the G-tube as well as rectal bleeding.  Patient states that he noticed some redness around the G-tube for the last 2 to 3 days.  Also some purulent discharge around the G-tube as well.  He also noticed that he is a little constipated and has some bright red blood per rectum since yesterday.  He states that he does have hemorrhoids but never had this much pain.  Patient follows up with GI at [facility]   The history is provided by the patient.       Prior to Admission medications   Medication Sig Start Date End Date Taking? Authorizing Provider  acetaminophen  (TYLENOL ) 500 MG tablet Take 500-1,000 mg by mouth 2 (two) times daily as needed (pain.).    [provider]  buPROPion  (WELLBUTRIN  XL) 150 MG 24 hr tablet Take 1 tablet (150 mg total) by mouth daily. Bridge to patient appt 12/16 Patient taking differently: Take 150 mg by mouth every evening. Bridge to patient appt 12/16 01/14/24   Arfeen, Leni DASEN, MD  lamoTRIgine  (LAMICTAL ) 150 MG tablet Take 1 tablet (150 mg total) by mouth daily. Patient taking differently: Take 150 mg by mouth every evening. 01/14/24   Arfeen, Leni DASEN, MD  lidocaine  (XYLOCAINE ) 2 % solution Use as directed 15 mLs in the mouth or throat as needed for mouth pain. Mix 1 part 2%viscous lidocaine ,1part H2O.Swish and/or swallow 10ml of this mixture,48min before meals and at bedtime, up to QID 03/05/24   Wyatt Leeroy HERO, PA-C  LORazepam  (ATIVAN ) 0.5 MG tablet Take 1 tablet (0.5 mg total) by mouth as needed for anxiety. Take 1 tablet 20 minutes prior to radiation treatment. Please have a driver given sedative effects. 02/13/24   Wyatt Leeroy HERO, PA-C  Melatonin 5 MG  TABS Take 5 mg by mouth at bedtime as needed (sleep).    [provider]  metoprolol  succinate (TOPROL -XL) 25 MG 24 hr tablet Take 25 mg by mouth every evening. 09/18/22   [provider]  mirtazapine  (REMERON ) 15 MG tablet Take 1 tablet (15 mg total) by mouth as needed (for sleep). 01/14/24 01/13/25  Arfeen, Leni DASEN, MD  Nutritional Supplements (NUTREN 1.5) LIQD 7 cartons Nutren 1.5/equivalent split over 4 feedings Flush tube with 60 ml water before and after each bolus Give additional 3.5 cups water (830 ml water) via PEG/po to meet hydration needs  Provides 2625 kcal, 119 g, 1337 ml free water (2647 ml total water). 1750 ml/day meets >95% est needs, 100% DRI 03/14/24   Izell Domino, MD  olmesartan (BENICAR) 5 MG tablet Take 5 mg by mouth every evening.    [provider]  oxyCODONE  (ROXICODONE ) 5 MG/5ML solution Take 5 mLs (5 mg total) by mouth every 4 (four) hours as needed for severe pain (pain score 7-10). Take w/ food. 03/10/24   Izell Domino, MD  promethazine  (PHENERGAN ) 25 MG tablet Take 1 tablet (25 mg total) by mouth every 6 (six) hours as needed for nausea or vomiting. 03/17/24   Izell Domino, MD    Allergies: Patient has no known allergies.    Review of Systems  Gastrointestinal:  Positive for abdominal pain.  All other systems reviewed and  are negative.   Updated Vital Signs BP 99/70 (BP Location: Left Arm)   Pulse (!) 104   Temp 98.5 F (36.9 C) (Oral)   Resp 18   SpO2 97%   Physical Exam Vitals and nursing note reviewed.  Constitutional:      Comments: Phonically ill and dehydrated  HENT:     Head: Normocephalic.     Nose: Nose normal.     Mouth/Throat:     Mouth: Mucous membranes are dry.  Eyes:     Extraocular Movements: Extraocular movements intact.     Pupils: Pupils are equal, round, and reactive to light.  Cardiovascular:     Rate and Rhythm: Normal rate and regular rhythm.     Pulses: Normal pulses.     Heart sounds: Normal heart  sounds.  Pulmonary:     Effort: Pulmonary effort is normal.     Breath sounds: Normal breath sounds.  Abdominal:     General: Abdomen is flat.     Comments: Patient has a G-tube with some surrounding erythema  Genitourinary:    Comments: Patient does have a stool ball in the rectum.  Patient also has small external hemorrhoid with no active bleeding.  Stool is dark but there is no blood around it Musculoskeletal:     Cervical back: Normal range of motion and neck supple.  Skin:    General: Skin is warm.     Capillary Refill: Capillary refill takes less than 2 seconds.  Neurological:     General: No focal deficit present.     Mental Status: He is oriented to person, place, and time.  Psychiatric:        Mood and Affect: Mood normal.        Behavior: Behavior normal.     (all labs ordered are listed, but only abnormal results are displayed) Labs Reviewed  COMPREHENSIVE METABOLIC PANEL WITH GFR - Abnormal; Notable for the following components:      Result Value   Sodium 132 (*)    Glucose, Bld 130 (*)    All other components within normal limits  CBC - Abnormal; Notable for the following components:   Hemoglobin 12.9 (*)    All other components within normal limits  POC OCCULT BLOOD, ED - Abnormal; Notable for the following components:   Fecal Occult Bld POSITIVE (*)    All other components within normal limits  CULTURE, BLOOD (ROUTINE X 2)  CULTURE, BLOOD (ROUTINE X 2)  LIPASE, BLOOD  URINALYSIS, ROUTINE W REFLEX MICROSCOPIC  TYPE AND SCREEN  ABO/RH    EKG: None  Radiology: No results found.   Procedures   Medications Ordered in the ED  piperacillin -tazobactam (ZOSYN ) IVPB 3.375 g (3.375 g Intravenous New Bag/Given 03/18/24 1258)  vancomycin  (VANCOREADY) IVPB 2000 mg/400 mL (has no administration in time range)  sodium chloride  0.9 % bolus 1,000 mL (1,000 mLs Intravenous New Bag/Given 03/18/24 1243)  HYDROmorphone  (DILAUDID ) injection 1 mg (1 mg Intravenous Given  03/18/24 1244)  ondansetron  (ZOFRAN ) injection 4 mg (4 mg Intravenous Given 03/18/24 1241)                                    Medical Decision Making Douglas Hester is a 63 y.o. male here presenting with blood in the stool as well as erythema around the G-tube.  Patient has a G-tube placed about 5 days ago but interventional radiology.  There  seems to be surrounding erythema concerning for possible abscess versus cellulitis.  Patient also has blood in his stool and is constipated on my rectal exam.  Plan to get CBC and CMP and blood cultures and will give empiric antibiotics.  Will also get CT abdomen pelvis to further assess for cellulitis versus abscess versus source of GI bleed.  3:15 PM Hemoglobin dropped to 12.9 from 14.  Guaiac is positive.  Patient does have stool impaction on CT scan.  Given enema.  Consulted Dr. Burnette from gastro to see patient.   3:36 PM CT showed inflammation around the percutaneous tube.  Patient blood pressure improved with IV fluids.  Will admit for mild abdominal cellulitis around the PEG tube, GI bleed.   Problems Addressed: Cellulitis, abdominal wall: acute illness or injury Lower GI bleed: acute illness or injury  Amount and/or Complexity of Data Reviewed Labs: ordered. Decision-making details documented in ED Course. Radiology: ordered and independent interpretation performed. Decision-making details documented in ED Course.  Risk OTC drugs. Prescription drug management.     Final diagnoses:  None    ED Discharge Orders     None          Patt Alm Macho, MD 03/18/24 1537

## 2024-03-19 ENCOUNTER — Ambulatory Visit
Admission: RE | Admit: 2024-03-19 | Discharge: 2024-03-19 | Disposition: A | Discharge status: Home / Self Care | Source: Ambulatory Visit | Attending: Radiation Oncology

## 2024-03-19 ENCOUNTER — Inpatient Hospital Stay: Admitting: Dietician

## 2024-03-19 ENCOUNTER — Other Ambulatory Visit: Payer: Self-pay

## 2024-03-19 DIAGNOSIS — E440 Moderate protein-calorie malnutrition: Secondary | ICD-10-CM | POA: Insufficient documentation

## 2024-03-19 DIAGNOSIS — K922 Gastrointestinal hemorrhage, unspecified: Secondary | ICD-10-CM | POA: Diagnosis not present

## 2024-03-19 LAB — CBC
HCT: 36.7 % — ABNORMAL LOW (ref 39.0–52.0)
Hemoglobin: 11.7 g/dL — ABNORMAL LOW (ref 13.0–17.0)
MCH: 28.3 pg (ref 26.0–34.0)
MCHC: 31.9 g/dL (ref 30.0–36.0)
MCV: 88.9 fL (ref 80.0–100.0)
Platelets: 204 K/uL (ref 150–400)
RBC: 4.13 MIL/uL — ABNORMAL LOW (ref 4.22–5.81)
RDW: 12.5 % (ref 11.5–15.5)
WBC: 8.1 K/uL (ref 4.0–10.5)
nRBC: 0 % (ref 0.0–0.2)

## 2024-03-19 LAB — BASIC METABOLIC PANEL WITH GFR
Anion gap: 8 (ref 5–15)
BUN: 10 mg/dL (ref 8–23)
CO2: 24 mmol/L (ref 22–32)
Calcium: 8.7 mg/dL — ABNORMAL LOW (ref 8.9–10.3)
Chloride: 104 mmol/L (ref 98–111)
Creatinine, Ser: 0.68 mg/dL (ref 0.61–1.24)
GFR, Estimated: >60 mL/min (ref >60)
Glucose, Bld: 104 mg/dL — ABNORMAL HIGH (ref 70–99)
Potassium: 3.9 mmol/L (ref 3.5–5.1)
Sodium: 136 mmol/L (ref 135–145)

## 2024-03-19 LAB — RAD ONC ARIA SESSION SUMMARY
Course Elapsed Days: 28
Plan Fractions Treated to Date: 19
Plan Prescribed Dose Per Fraction: 2 Gy
Plan Total Fractions Prescribed: 35
Plan Total Prescribed Dose: 70 Gy
Reference Point Dosage Given to Date: 38 Gy
Reference Point Session Dosage Given: 2 Gy
Session Number: 19

## 2024-03-19 LAB — HEMOGLOBIN AND HEMATOCRIT, BLOOD
HCT: 37.5 % — ABNORMAL LOW (ref 39.0–52.0)
HCT: 41.3 % (ref 39.0–52.0)
Hemoglobin: 11.9 g/dL — ABNORMAL LOW (ref 13.0–17.0)
Hemoglobin: 12.9 g/dL — ABNORMAL LOW (ref 13.0–17.0)

## 2024-03-19 LAB — GLUCOSE, CAPILLARY
Glucose-Capillary: 85 mg/dL (ref 70–99)
Glucose-Capillary: 121 mg/dL — ABNORMAL HIGH (ref 70–99)

## 2024-03-19 LAB — HIV ANTIBODY (ROUTINE TESTING W REFLEX): HIV Screen 4th Generation wRfx: NONREACTIVE

## 2024-03-19 MED ORDER — FAMOTIDINE 20 MG PO TABS
20.0000 mg | ORAL_TABLET | Freq: Every day | ORAL | Status: DC
  Administered 2024-03-19 – 2024-03-20 (×2): 20 mg via ORAL
  Filled 2024-03-19 (×2): qty 1

## 2024-03-19 MED ORDER — POLYETHYLENE GLYCOL 3350 17 G PO PACK
17.0000 g | PACK | Freq: Two times a day (BID) | ORAL | Status: DC
  Administered 2024-03-19 – 2024-03-20 (×2): 17 g via ORAL
  Filled 2024-03-19 (×2): qty 1

## 2024-03-19 MED ORDER — PROCHLORPERAZINE EDISYLATE 10 MG/2ML IJ SOLN
10.0000 mg | Freq: Four times a day (QID) | INTRAMUSCULAR | Status: DC | PRN
  Administered 2024-03-20: 10 mg via INTRAVENOUS
  Filled 2024-03-19: qty 2

## 2024-03-19 MED ORDER — OSMOLITE 1.5 CAL PO LIQD
237.0000 mL | Freq: Four times a day (QID) | ORAL | Status: DC
  Administered 2024-03-19 – 2024-03-20 (×4): 237 mL
  Filled 2024-03-19 (×6): qty 237

## 2024-03-19 MED ORDER — NYSTATIN 100000 UNIT/ML MT SUSP
5.0000 mL | Freq: Four times a day (QID) | OROMUCOSAL | Status: DC
  Administered 2024-03-19 – 2024-03-20 (×5): 500000 [IU] via ORAL
  Filled 2024-03-19 (×5): qty 5

## 2024-03-19 MED ORDER — OSMOLITE 1.2 CAL PO LIQD: 1000.0000 mL | ORAL | Status: DC

## 2024-03-19 NOTE — Progress Notes (Addendum)
 Initial Nutrition Assessment  INTERVENTION:   Once tube okay to use per IR: -Provide 1 carton of Osmolite 1.5 QID via PEG -Flush with 60 ml H2O before and after each feeding  -Advance as tolerated to goal rate of 7 cartons of Osmolite 1.5 (or equivalent) daily split over 4-5 feedings.  -Flush with 60 ml free water before and after each feeding. -This provides 2450 kcals, 104g protein and 2107 ml H2O.  NUTRITION DIAGNOSIS:   Moderate Malnutrition related to chronic illness, cancer and cancer related treatments as evidenced by mild fat depletion, percent weight loss.  GOAL:   Patient will meet greater than or equal to 90% of their needs  MONITOR:   PO intake, TF tolerance  REASON FOR ASSESSMENT:   Consult Enteral/tube feeding initiation and management  ASSESSMENT:   63 y.o. 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  He has been tolerating radiation well, but having some dysphagia and lack of appetite, therefore G-tube was placed by IR on 8/14.  Patient in room, reports feeling concerned he hasn't received any nutrition since early yesterday morning. Not on any IVFs.  IR consulted to assess G-tube as pt states it was hurting around the site and is red.  States there have been no issue with his feedings themselves. Pt administers his own gravity bolus feeds of Osmolite 1.5 at home. Flushes with 60 ml water before and after each feeding. At most has administered 3.5 cartons a day. Working towards advancing to goal slowly, which is 7 cartons daily. Okay with starting with 4 cartons here once tube is okay to use.  Pt on clear liquids but currently wants nothing PO as he states everything tastes like P[place].   Per weight records, pt has lost 13 lbs since 7/1 (6% wt loss x <2 months, significant for  time frame).  Medications: Miralax , Senokot  Labs reviewed.   NUTRITION - FOCUSED PHYSICAL EXAM:  Flowsheet Row Most Recent Value  Orbital Region Mild depletion  Upper Arm Region Mild depletion  Thoracic and Lumbar Region No depletion  Buccal Region No depletion  Temple Region Mild depletion  Clavicle Bone Region No depletion  Clavicle and Acromion Bone Region No depletion  Scapular Bone Region No depletion  Dorsal Hand No depletion  Patellar Region Unable to assess  Anterior Thigh Region Unable to assess  Posterior Calf Region Unable to assess  Edema (RD Assessment) None  Hair Reviewed  Eyes Reviewed  Mouth Reviewed  Skin Reviewed  Nails Reviewed    Diet Order:   Diet Order             Diet clear liquid Room service appropriate? Yes; Fluid consistency: Thin  Diet effective now                   EDUCATION NEEDS:   No education needs have been identified at this time  Skin:  Skin Assessment: Reviewed RN Assessment  Last BM:  8/19  Height:   Ht Readings from Last 1 Encounters:  03/18/24 5' 9 (1.753 m)    Weight:   Wt Readings from Last 1 Encounters:  03/18/24 92.5 kg    BMI:  Body mass index is 30.11 kg/m.  Estimated Nutritional Needs:   Kcal:  2300-2550  Protein:  110-130g  Fluid:  2.3L/day   Morna Lee, MS, RD, LDN Inpatient Clinical Dietitian  Contact via Secure chat

## 2024-03-19 NOTE — Progress Notes (Signed)
   03/19/24 0854  TOC Brief Assessment  Insurance and Status Reviewed  Patient has primary care physician Yes  Home environment has been reviewed Resides in an apartment  Prior level of function: Independent with ADLs at baseline  Prior/Current Home Services No current home services  Social Drivers of Health Review SDOH reviewed no interventions necessary  Readmission risk has been reviewed Yes  Transition of care needs no transition of care needs at this time

## 2024-03-19 NOTE — Progress Notes (Signed)
 PROGRESS NOTE    Rober Skeels  FMW:[id number] DOB: 1961/02/06 DOA: 03/18/2024 PCP: Katina Pfeiffer, PA-C   Brief Narrative: 63 year old with past medical history significant for laryngeal squamous cell carcinoma, undergoing radiation therapy and recent G-tube placement admitted to the hospital with G-tube cellulitis, constipation, associated rectal bleeding.  [name], [name]  [name]  Couple of days ago he noticed redness and warmth around the G-tube site, some associated post drainage.  Denies fever or chills.  He has been taking oxycodone , has not been on stool softener, he has been feeling constipated.  [name]  Admitted with cellulitis around the G-tube, severe constipation, rectal bleeding.  Assessment & Plan:   Principal Problem:   Lower GI bleed  1-G-tube cellulitis - CT abdomen and pelvis:Percutaneous gastrostomy tube in place. There is subcutaneous fat stranding surrounding the PEG tube without focal fluid collection or air. Correlate clinically for cellulitis. Report worsening pain at site of peg tube, and some drainage. IR consulted for Peg tube evaluation.   Stool impaction: -In setting of Opioids.  Had large BM, feels better.  Start Miralax  BID, continue senna   Rectal bleeding: Likely hemorrhoid in the setting of stool impaction GI was consulted -Hemoglobin relatively stable 11.7 from 12.6 yesterday Monitor   Laryngeal squamous cell carcinoma receiving radiation by Dr. Izell  Possible Oral thrush Statin trial.    Estimated body mass index is 30.11 kg/m as calculated from the following:   Height as of this encounter: 5' 9 (1.753 m).   Weight as of this encounter: 92.5 kg.   DVT  prophylaxis: SCD Code Status: Full code Family Communication: Family at bedside Disposition Plan:  Status is: Observation The patient remains OBS appropriate and will d/c before 2 midnights.    Consultants:  GI IR  Procedures:    Antimicrobials:    Subjective: Alert, report had large BM.  He hasn't been able to eat in 2 days or use tube feeding in 2 days due to pain.  Pain at peg tube site. Everything started dry heaving  Objective: Vitals:   03/18/24 1833 03/18/24 2342 03/18/24 2342 03/19/24 0459  BP: 126/75  116/75 99/61  Pulse: 95  93 100  Resp: 18  17 19   Temp: 98.1 F (36.7 C)  98.8 F (37.1 C) 98.4 F (36.9 C)  TempSrc: Oral  Oral Oral  SpO2: 99%  97% 97%  Weight:  92.5 kg    Height:  5' 9 (1.753 m)      Intake/Output Summary (Last 24 hours) at 03/19/2024 0834 Last data filed at 03/18/2024 2000 Gross per 24 hour  Intake 1490 ml  Output --  Net 1490 ml   Filed Weights   03/18/24 2342  Weight: 92.5 kg    Examination:  General exam: Appears calm and comfortable  Respiratory system: Clear to auscultation. Respiratory effort normal. Cardiovascular system: S1 & S2 heard, RRR. No JVD, murmurs, rubs, gallops or clicks. No pedal edema. Gastrointestinal system: Abdomen is nondistended, soft and nontender.  Central nervous system: Alert and oriented Extremities: Symmetric 5 x 5 power.   Data Reviewed: I have personally reviewed following labs and imaging studies  CBC: Recent Labs  Lab 03/13/24 0954 03/18/24 0941  03/18/24 1833 03/19/24 0101  WBC 5.2 7.1  --  8.1  NEUTROABS 3.9  --   --   --   HGB 14.0 12.9* 12.6* 11.7*  HCT 42.6 40.7 40.0 36.7*  MCV 88.6 88.1  --  88.9  PLT 242 237  --  204   Basic Metabolic Panel: Recent Labs  Lab 03/18/24 0941 03/19/24 0101  NA 132* 136  K 4.2 3.9  CL 99 104  CO2 24 24  GLUCOSE 130* 104*  BUN 13 10  CREATININE 0.83 0.68  CALCIUM 9.1 8.7*   GFR: Estimated Creatinine Clearance: 107.5 mL/min (by C-G  formula based on SCr of 0.68 mg/dL). Liver Function Tests: Recent Labs  Lab 03/18/24 0941  AST 28  ALT 43  ALKPHOS 71  BILITOT 0.6  PROT 7.2  ALBUMIN 3.6   Recent Labs  Lab 03/18/24 2058  LIPASE 29   No results for input(s): AMMONIA in the last 168 hours. Coagulation Profile: Recent Labs  Lab 03/13/24 0954  INR 1.0   Cardiac Enzymes: No results for input(s): CKTOTAL, CKMB, CKMBINDEX, TROPONINI in the last 168 hours. BNP (last 3 results) No results for input(s): PROBNP in the last 8760 hours. HbA1C: No results for input(s): HGBA1C in the last 72 hours. CBG: No results for input(s): GLUCAP in the last 168 hours. Lipid Profile: No results for input(s): CHOL, HDL, LDLCALC, TRIG, CHOLHDL, LDLDIRECT in the last 72 hours. Thyroid  Function Tests: No results for input(s): TSH, T4TOTAL, FREET4, T3FREE, THYROIDAB in the last 72 hours. Anemia Panel: No results for input(s): VITAMINB12, FOLATE, FERRITIN, TIBC, IRON, RETICCTPCT in the last 72 hours. Sepsis Labs: No results for input(s): PROCALCITON, LATICACIDVEN in the last 168 hours.  Recent Results (from the past 240 hours)  Blood culture (routine x 2)     Status: None (Preliminary result)   Collection Time: 03/18/24 12:22 PM   Specimen: BLOOD RIGHT ARM  Result Value Ref Range Status   Specimen Description   Final    BLOOD RIGHT ARM Performed at [facility], [address], [place], [place] [address]    Special Requests   Final    BOTTLES DRAWN AEROBIC AND ANAEROBIC Blood Culture adequate volume Performed at [facility], [address] [place], [place] [address]    Culture PENDING  Incomplete   Report Status PENDING  Incomplete  Blood culture (routine x 2)     Status: None (Preliminary result)   Collection Time: 03/18/24 12:22 PM   Specimen: BLOOD LEFT ARM  Result Value Ref Range Status   Specimen Description   Final    BLOOD LEFT  ARM Performed at [facility], [address], [place], [place] [address]    Special Requests   Final    BOTTLES DRAWN AEROBIC AND ANAEROBIC Blood Culture results may not be optimal due to an inadequate volume of blood received in culture bottles Performed at [facility], [address] [place], [place] [address]    Culture PENDING  Incomplete   Report Status PENDING  Incomplete         Radiology Studies: CT ABDOMEN PELVIS W CONTRAST Result Date: 03/18/2024 CLINICAL DATA:  Acute abdominal pain EXAM: CT ABDOMEN AND PELVIS WITH CONTRAST TECHNIQUE: Multidetector CT imaging of the abdomen and pelvis was performed using the standard protocol following bolus administration of intravenous contrast. RADIATION DOSE REDUCTION: This exam was performed according to the departmental dose-optimization program which includes automated exposure control, adjustment of  the mA and/or kV according to patient size and/or use of iterative reconstruction technique. CONTRAST:  OMNIPAQUE  IOHEXOL  300 MG/ML  SOLN COMPARISON:  CT abdomen and pelvis 04/29/2017. FINDINGS: Lower chest: No acute abnormality. Hepatobiliary: No focal liver abnormality is seen. Status post cholecystectomy. No biliary dilatation. Pancreas: Unremarkable. No pancreatic ductal dilatation or surrounding inflammatory changes. Spleen: Normal in size without focal abnormality. Adrenals/Urinary Tract: Adrenal glands are unremarkable. Kidneys are normal, without renal calculi, focal lesion, or hydronephrosis. Bladder is distended. Stomach/Bowel: Percutaneous gastrostomy tube is in place in the body of the stomach. There is no bowel obstruction, pneumatosis, free air or focal inflammation identified. The appendix is within normal limits. There is a large amount of stool distending the rectum. Vascular/Lymphatic: No significant vascular findings are present. No enlarged abdominal or pelvic lymph nodes. Reproductive: Prostate is  unremarkable. Other: There is no ascites. There is subcutaneous fat stranding surrounding the PEG tube without focal fluid collection or air. No abdominal wall hernia. There is mild presacral edema. Musculoskeletal: No fracture is seen. Degenerative changes affect the spine and hips. IMPRESSION: 1. Percutaneous gastrostomy tube in place. There is subcutaneous fat stranding surrounding the PEG tube without focal fluid collection or air. Correlate clinically for cellulitis. 2. Large amount of stool distending the rectum. 3. Distended bladder. 4. Mild presacral edema. Electronically Signed   By: Greig Pique M.D.   On: 03/18/2024 15:31        Scheduled Meds:  buPROPion   150 mg Oral QPM   lamoTRIgine   150 mg Oral QPM   senna-docusate  1 tablet Oral BID   sulfamethoxazole -trimethoprim   1 tablet Oral Q12H   Continuous Infusions:   LOS: 0 days    Time spent: 35 Minutes    Joesph Marcy A Deneise Getty, MD [facility]   If 7PM-7AM, please contact night-coverage [email or web address]  03/19/2024, 8:34 AM

## 2024-03-19 NOTE — Consult Note (Signed)
 [facility] Consultation Note  Referring Provider:  [facility]  Primary Care Physician:  Katina Pfeiffer, PA-C Primary Gastroenterologist:  [place]  Reason for Consultation:  blood stool  HPI: Douglas Hester is a 63 y.o. male laryngeal cancer with radiation.  PEG placed last week.  Admitted with PEG cellulitis.  Has had constipation with some blood in stool and known hemorrhoid.  Had colonoscopy 2023 (Dr. Emeline, [facility]) which was unrevealing.     Past Medical History:  Diagnosis Date   Anxiety    Chronic GERD    Heart murmur    Hypertension    Tachycardia     Past Surgical History:  Procedure Laterality Date   CHOLECYSTECTOMY N/A 10/11/2018   Procedure: LAPAROSCOPIC CHOLECYSTECTOMY WITH INTRAOPERATIVE CHOLANGIOGRAM;  Surgeon: Curvin Deward MOULD, MD;  Location: [facility]S;  Service: General;  Laterality: N/A;   COLONOSCOPY  ~ 2014   done by GI in [place]. Polyps were removed. Patient not aware what type of polyps these were   DIRECT LARYNGOSCOPY N/A 01/28/2024   Procedure: LARYNGOSCOPY, DIRECT WITH BIOPSIES;  Surgeon: Tobie Eldora NOVAK, MD;  Location: [facility];  Service: ENT;  Laterality: N/A;   ESOPHAGOGASTRODUODENOSCOPY  early 1990s   MD found ulcer   INGUINAL HERNIA REPAIR Left ~ 2010   with Mesh.    IR GASTROSTOMY TUBE MOD SED  03/13/2024   VASECTOMY      Prior to Admission medications   Medication Sig Start Date End Date Taking? Authorizing Provider  acetaminophen  (TYLENOL ) 500 MG tablet Take 500-1,000 mg by mouth 2 (two) times daily as needed (pain.).   Yes [provider]  buPROPion  (WELLBUTRIN  XL) 150 MG 24 hr tablet Take 1 tablet (150 mg total) by mouth daily. Bridge to patient appt 12/16 Patient taking differently: Take 150 mg by mouth every evening. Bridge to patient appt 12/16 01/14/24  Yes Arfeen, Leni DASEN, MD  lamoTRIgine  (LAMICTAL ) 150 MG tablet Take 1 tablet (150 mg total) by mouth daily. Patient taking differently: Take 150 mg by  mouth every evening. 01/14/24  Yes Arfeen, Leni DASEN, MD  lidocaine  (XYLOCAINE ) 2 % solution Use as directed 15 mLs in the mouth or throat as needed for mouth pain. Mix 1 part 2%viscous lidocaine ,1part H2O.Swish and/or swallow 10ml of this mixture,22min before meals and at bedtime, up to QID 03/05/24  Yes Wyatt Leeroy HERO, PA-C  LORazepam  (ATIVAN ) 0.5 MG tablet Take 1 tablet (0.5 mg total) by mouth as needed for anxiety. Take 1 tablet 20 minutes prior to radiation treatment. Please have a driver given sedative effects. 02/13/24  Yes Wyatt Leeroy HERO, PA-C  Melatonin 5 MG TABS Take 5 mg by mouth at bedtime as needed (sleep).   Yes [provider]  metoprolol  succinate (TOPROL -XL) 25 MG 24 hr tablet Take 25 mg by mouth every evening. 09/18/22  Yes [provider]  mirtazapine  (REMERON ) 15 MG tablet Take 1 tablet (15 mg total) by mouth as needed (for sleep). 01/14/24 01/13/25 Yes Arfeen, Leni DASEN, MD  Nutritional Supplements (NUTREN 1.5) LIQD 7 cartons Nutren 1.5/equivalent split over 4 feedings Flush tube with 60 ml water before and after each bolus Give additional 3.5 cups water (830 ml water) via PEG/po to meet hydration needs  Provides 2625 kcal, 119 g, 1337 ml free water (2647 ml total water). 1750 ml/day meets >95% est needs, 100% DRI 03/14/24  Yes Izell Domino, MD  olmesartan (BENICAR) 5 MG tablet Take 5 mg by mouth every evening.   Yes [provider]  oxyCODONE  (ROXICODONE ) 5 MG/5ML solution Take 5 mLs (5 mg total) by mouth every 4 (four) hours as needed for severe pain (pain score 7-10). Take w/ food. 03/10/24  Yes Izell Domino, MD  promethazine  (PHENERGAN ) 25 MG tablet Take 1 tablet (25 mg total) by mouth every 6 (six) hours as needed for nausea or vomiting. 03/17/24  Yes Izell Domino, MD    Current Facility-Administered Medications  Medication Dose Route Frequency Provider Last Rate Last Admin   acetaminophen  (TYLENOL ) tablet 650 mg  650 mg Oral Q6H PRN Zella, Mir M, MD   650  mg at 03/18/24 1950   Or   acetaminophen  (TYLENOL ) suppository 650 mg  650 mg Rectal Q6H PRN Zella, Mir M, MD       albuterol  (PROVENTIL ) (2.5 MG/3ML) 0.083% nebulizer solution 2.5 mg  2.5 mg Nebulization Q2H PRN Zella, Mir M, MD       buPROPion  (WELLBUTRIN  XL) 24 hr tablet 150 mg  150 mg Oral QPM Zella, Mir M, MD       lamoTRIgine  (LAMICTAL ) tablet 150 mg  150 mg Oral QPM Zella, Mir M, MD       LORazepam  (ATIVAN ) tablet 0.5 mg  0.5 mg Oral Q6H PRN Zella, Mir M, MD       mirtazapine  (REMERON ) tablet 15 mg  15 mg Oral PRN Zella, Mir M, MD       nystatin  (MYCOSTATIN ) 100000 UNIT/ML suspension 500,000 Units  5 mL Oral QID Regalado, Belkys A, MD   500,000 Units at 03/19/24 1108   ondansetron  (ZOFRAN ) tablet 4 mg  4 mg Oral Q6H PRN Zella, Mir M, MD       Or   ondansetron  (ZOFRAN ) injection 4 mg  4 mg Intravenous Q6H PRN Zella, Mir M, MD       oxyCODONE  (Oxy IR/ROXICODONE ) immediate release tablet 5 mg  5 mg Oral Q4H PRN Zella, Mir M, MD       polyethylene glycol (MIRALAX  / GLYCOLAX ) packet 17 g  17 g Oral BID PRN Zella, Mir M, MD       polyethylene glycol (MIRALAX  / GLYCOLAX ) packet 17 g  17 g Oral BID Regalado, Belkys A, MD   17 g at 03/19/24 9095   senna-docusate (Senokot-S) tablet 1 tablet  1 tablet Oral BID Zella Katha HERO, MD   1 tablet at 03/19/24 9096   sulfamethoxazole -trimethoprim  (BACTRIM  DS) 800-160 MG per tablet 1 tablet  1 tablet Oral Q12H Zella, Mir M, MD   1 tablet at 03/19/24 9095    Allergies as of 03/18/2024   (No Known Allergies)    Family History  Problem Relation Age of Onset   Lung cancer Mother    Lymphoma Father    Valvular heart disease Sister     Social History   Socioeconomic History   Marital status: Divorced    Spouse name: Not on file   Number of children: 0   Years of education: Not on file   Highest education level: Not on file  Occupational History   Not on file  Tobacco Use   Smoking status:  Never   Smokeless tobacco: Never  Vaping Use   Vaping status: Never Used  Substance and Sexual Activity   Alcohol use: Yes    Comment: rare   Drug use: No   Sexual activity: Not on file  Other Topics Concern   Not on file  Social History Narrative   Not on file   Social Drivers  of Health   Financial Resource Strain: Not on file  Food Insecurity: No Food Insecurity (03/18/2024)   Hunger Vital Sign    Worried About Running Out of Food in the Last Year: Never true    Ran Out of Food in the Last Year: Never true  Transportation Needs: No Transportation Needs (03/18/2024)   PRAPARE - Administrator, Civil Service (Medical): No    Lack of Transportation (Non-Medical): No  Physical Activity: Not on file  Stress: Not on file  Social Connections: Not on file  Intimate Partner Violence: Not At Risk (03/18/2024)   Humiliation, Afraid, Rape, and Kick questionnaire    Fear of Current or Ex-Partner: No    Emotionally Abused: No    Physically Abused: No    Sexually Abused: No    Review of Systems: As per HPI, all others negative.  Physical Exam: Vital signs in last 24 hours: Temp:  [98.1 F (36.7 C)-98.8 F (37.1 C)] 98.4 F (36.9 C) (08/20 0459) Pulse Rate:  [93-100] 100 (08/20 0459) Resp:  [17-19] 19 (08/20 0459) BP: (99-126)/(61-75) 99/61 (08/20 0459) SpO2:  [97 %-99 %] 97 % (08/20 0459) Weight:  [92.5 kg] 92.5 kg (08/19 2342) Last BM Date : 03/18/24 General:   Alert,  Well-developed, well-nourished, pleasant and cooperative in NAD Head:  Normocephalic and atraumatic. Eyes:  Sclera clear, no icterus.   Conjunctiva pink. Ears:  Normal auditory acuity. Nose:  No deformity, discharge,  or lesions. Mouth:  No deformity or lesions.  Oropharynx pink & moist. Neck:  Supple; no masses or thyromegaly. Lungs:  No visible distress Abdomen:  Soft, nontender and nondistended. PEG cellulitis; No masses, hepatosplenomegaly or hernias noted. No guarding, and without rebound.      Msk:  Symmetrical without gross deformities. Normal posture. Pulses:  Normal pulses noted. Extremities:  Without clubbing or edema. Neurologic:  Alert and  oriented x4;  grossly normal neurologically. Skin:  Intact without significant lesions or rashes. Psych:  Alert and cooperative. Normal mood and affect.   Lab Results: Recent Labs    03/18/24 0941 03/18/24 1833 03/19/24 0101 03/19/24 0823  WBC 7.1  --  8.1  --   HGB 12.9* 12.6* 11.7* 11.9*  HCT 40.7 40.0 36.7* 37.5*  PLT 237  --  204  --    BMET Recent Labs    03/18/24 0941 03/19/24 0101  NA 132* 136  K 4.2 3.9  CL 99 104  CO2 24 24  GLUCOSE 130* 104*  BUN 13 10  CREATININE 0.83 0.68  CALCIUM 9.1 8.7*   LFT Recent Labs    03/18/24 0941  PROT 7.2  ALBUMIN 3.6  AST 28  ALT 43  ALKPHOS 71  BILITOT 0.6   PT/INR No results for input(s): LABPROT, INR in the last 72 hours.  Studies/Results: CT ABDOMEN PELVIS W CONTRAST Result Date: 03/18/2024 CLINICAL DATA:  Acute abdominal pain EXAM: CT ABDOMEN AND PELVIS WITH CONTRAST TECHNIQUE: Multidetector CT imaging of the abdomen and pelvis was performed using the standard protocol following bolus administration of intravenous contrast. RADIATION DOSE REDUCTION: This exam was performed according to the departmental dose-optimization program which includes automated exposure control, adjustment of the mA and/or kV according to patient size and/or use of iterative reconstruction technique. CONTRAST:  OMNIPAQUE  IOHEXOL  300 MG/ML  SOLN COMPARISON:  CT abdomen and pelvis 04/29/2017. FINDINGS: Lower chest: No acute abnormality. Hepatobiliary: No focal liver abnormality is seen. Status post cholecystectomy. No biliary dilatation. Pancreas: Unremarkable. No pancreatic  ductal dilatation or surrounding inflammatory changes. Spleen: Normal in size without focal abnormality. Adrenals/Urinary Tract: Adrenal glands are unremarkable. Kidneys are normal, without renal calculi, focal  lesion, or hydronephrosis. Bladder is distended. Stomach/Bowel: Percutaneous gastrostomy tube is in place in the body of the stomach. There is no bowel obstruction, pneumatosis, free air or focal inflammation identified. The appendix is within normal limits. There is a large amount of stool distending the rectum. Vascular/Lymphatic: No significant vascular findings are present. No enlarged abdominal or pelvic lymph nodes. Reproductive: Prostate is unremarkable. Other: There is no ascites. There is subcutaneous fat stranding surrounding the PEG tube without focal fluid collection or air. No abdominal wall hernia. There is mild presacral edema. Musculoskeletal: No fracture is seen. Degenerative changes affect the spine and hips. IMPRESSION: 1. Percutaneous gastrostomy tube in place. There is subcutaneous fat stranding surrounding the PEG tube without focal fluid collection or air. Correlate clinically for cellulitis. 2. Large amount of stool distending the rectum. 3. Distended bladder. 4. Mild presacral edema. Electronically Signed   By: Greig Pique M.D.   On: 03/18/2024 15:31    Impression:   Throat cancer, in midst of radiation therapy. Failure to thrive s/p PEG placement last week. Constipation with blood in stool (mild). PEG-tube related cellulitis.  Plan:   Antibiotics for post-PEG cellulitis per primary team. Already received enemas for constipation; continue oral therapy with Miralax  and senokot. Abdominal xray tomorrow to reassess fecal burden. [facility] will follow.   LOS: 0 days   Courtany Mcmurphy M  03/19/2024, 1:22 PM  Cell [phone] If no answer or after 5 PM call [phone]

## 2024-03-19 NOTE — Progress Notes (Addendum)
 Patient ID: Douglas Hester, male   DOB: 17-Aug-1960, 62 y.o.   MRN: [id number] Asked by primary team to eval pt's G tube. A 20 fr pull thru G tube was placed on 03/13/24. On exam tube is intact, no visible bleeding or sig drainage at insertion site; some mild skin erythema lateral to outer disc, early cellulitis, sl tender; rec cont use of bactrim  for now; CT A/P yesterday revealed:   Percutaneous gastrostomy tube in place. There is subcutaneous fat stranding surrounding the PEG tube without focal fluid collection or air.  Rec keeping 1 layer of drain sponge gauze under disc at all times to prevent skin irritation; may also apply vaseline to skin under disc as needed as moisture barrier; keep disc taut to skin surface to prevent leaking of gastric contents; ok to use tube for feeds- flush tube with saline before initiating feeds. Nurse made aware. For any additional questions contact [phone].

## 2024-03-19 NOTE — Telephone Encounter (Addendum)
 Douglas Hester's brother Douglas Hester in office to see this nurse to pick-up disability paperwork due to patient.currently hospitalized.  Provided envelope with information this nurse completed and contacted radiation oncology for their office notes and treatment information for Douglas Hester to send by certified mail to [employer].  Emailed to [email or web address] for him to have a copy for his records as advised per these policy guidelines or steps.  No further questions or needs, .

## 2024-03-19 NOTE — Progress Notes (Signed)
 Planning to see patient for nutrition follow-up. Chart reviewed. Patient is currently hospitalized. Will monitor for discharge. Next nutrition appointment scheduled 8/26 after radiation.

## 2024-03-20 ENCOUNTER — Other Ambulatory Visit: Payer: Self-pay

## 2024-03-20 ENCOUNTER — Ambulatory Visit
Admission: RE | Admit: 2024-03-20 | Discharge: 2024-03-20 | Disposition: A | Discharge status: Home / Self Care | Source: Ambulatory Visit | Attending: Radiation Oncology

## 2024-03-20 ENCOUNTER — Observation Stay (HOSPITAL_COMMUNITY)

## 2024-03-20 DIAGNOSIS — K922 Gastrointestinal hemorrhage, unspecified: Secondary | ICD-10-CM | POA: Diagnosis not present

## 2024-03-20 LAB — RAD ONC ARIA SESSION SUMMARY
Course Elapsed Days: 29
Plan Fractions Treated to Date: 20
Plan Prescribed Dose Per Fraction: 2 Gy
Plan Total Fractions Prescribed: 35
Plan Total Prescribed Dose: 70 Gy
Reference Point Dosage Given to Date: 40 Gy
Reference Point Session Dosage Given: 2 Gy
Session Number: 20

## 2024-03-20 LAB — CBC
HCT: 39 % (ref 39.0–52.0)
Hemoglobin: 12.3 g/dL — ABNORMAL LOW (ref 13.0–17.0)
MCH: 28.4 pg (ref 26.0–34.0)
MCHC: 31.5 g/dL (ref 30.0–36.0)
MCV: 90.1 fL (ref 80.0–100.0)
Platelets: 230 K/uL (ref 150–400)
RBC: 4.33 MIL/uL (ref 4.22–5.81)
RDW: 12.5 % (ref 11.5–15.5)
WBC: 5.9 K/uL (ref 4.0–10.5)
nRBC: 0 % (ref 0.0–0.2)

## 2024-03-20 LAB — BASIC METABOLIC PANEL WITH GFR
Anion gap: 8 (ref 5–15)
BUN: 11 mg/dL (ref 8–23)
CO2: 25 mmol/L (ref 22–32)
Calcium: 8.9 mg/dL (ref 8.9–10.3)
Chloride: 102 mmol/L (ref 98–111)
Creatinine, Ser: 0.89 mg/dL (ref 0.61–1.24)
GFR, Estimated: >60 mL/min (ref >60)
Glucose, Bld: 100 mg/dL — ABNORMAL HIGH (ref 70–99)
Potassium: 4.2 mmol/L (ref 3.5–5.1)
Sodium: 135 mmol/L (ref 135–145)

## 2024-03-20 LAB — GLUCOSE, CAPILLARY
Glucose-Capillary: 98 mg/dL (ref 70–99)
Glucose-Capillary: 99 mg/dL (ref 70–99)
Glucose-Capillary: 101 mg/dL — ABNORMAL HIGH (ref 70–99)
Glucose-Capillary: 156 mg/dL — ABNORMAL HIGH (ref 70–99)

## 2024-03-20 MED ORDER — SENNOSIDES-DOCUSATE SODIUM 8.6-50 MG PO TABS: 1.0000 | ORAL_TABLET | Freq: Two times a day (BID) | ORAL | 0 refills | Status: DC | PRN

## 2024-03-20 MED ORDER — POLYETHYLENE GLYCOL 3350 17 G PO PACK: 17.0000 g | PACK | Freq: Two times a day (BID) | ORAL | 0 refills | Status: AC

## 2024-03-20 MED ORDER — SULFAMETHOXAZOLE-TRIMETHOPRIM 800-160 MG PO TABS: 1.0000 | ORAL_TABLET | Freq: Two times a day (BID) | ORAL | 0 refills | Status: AC

## 2024-03-20 MED ORDER — NYSTATIN 100000 UNIT/ML MT SUSP: 5.0000 mL | Freq: Four times a day (QID) | OROMUCOSAL | 0 refills | Status: DC

## 2024-03-20 NOTE — Progress Notes (Signed)
 Subjective: No bleeding. No discomfort about PEG site.  Objective: Vital signs in last 24 hours: Temp:  [97.8 F (36.6 C)-98.6 F (37 C)] 98.6 F (37 C) (08/21 0403) Pulse Rate:  [91-101] 92 (08/21 0403) Resp:  [16-20] 16 (08/21 0403) BP: (100-110)/(66-77) 100/66 (08/21 0403) SpO2:  [96 %-99 %] 96 % (08/21 0403) Weight:  [92.4 kg] 92.4 kg (08/21 0500) Weight change: -0.088 kg Last BM Date : 03/19/24  PE: GEN:  NAD NEURO:  No encephalopathy ABD:  PEG site mild erythema bordering disk on left; non tender  Lab Results: CBC    Component Value Date/Time   WBC 5.9 03/20/2024 0843   RBC 4.33 03/20/2024 0843   HGB 12.3 (L) 03/20/2024 0843   HCT 39.0 03/20/2024 0843   PLT 230 03/20/2024 0843   MCV 90.1 03/20/2024 0843   MCH 28.4 03/20/2024 0843   MCHC 31.5 03/20/2024 0843   RDW 12.5 03/20/2024 0843   LYMPHSABS 0.5 (L) 03/13/2024 0954   MONOABS 0.6 03/13/2024 0954   EOSABS 0.2 03/13/2024 0954   BASOSABS 0.0 03/13/2024 0954  CMP     Component Value Date/Time   NA 135 03/20/2024 0843   K 4.2 03/20/2024 0843   CL 102 03/20/2024 0843   CO2 25 03/20/2024 0843   GLUCOSE 100 (H) 03/20/2024 0843   BUN 11 03/20/2024 0843   CREATININE 0.89 03/20/2024 0843   CREATININE 0.95 03/10/2024 1227   CALCIUM 8.9 03/20/2024 0843   PROT 7.2 03/18/2024 0941   ALBUMIN 3.6 03/18/2024 0941   AST 28 03/18/2024 0941   ALT 43 03/18/2024 0941   ALKPHOS 71 03/18/2024 0941   BILITOT 0.6 03/18/2024 0941   GFRNONAA >60 03/20/2024 0843   GFRNONAA >60 03/10/2024 1227   XRay:  No significant constipation  Assessment:  Throat cancer, in midst of radiation therapy. Failure to thrive s/p PEG placement last week. Constipation with blood in stool (mild). PEG-tube related cellulitis.  Plan:   Antibiotics and topical antibiotics per IR and primary team for PEG-cellulitis. Miralax  17 grams po bid, now and upon discharge. Patient should follow-up with primary gastroenterology team ([facility]) for  follow-up of patient's constipation and bleeding (resolved). [facility] will sign-off; please call with questions; thanks for the consultation.   BURNETTE ELSIE HERO 03/20/2024, 12:14 PM   Cell [phone] If no answer or after 5 PM call [phone]

## 2024-03-20 NOTE — Discharge Summary (Incomplete)
 Physician Discharge Summary   Patient: Douglas Hester MRN: [id number] DOB: [birth year]  Admit date:     03/18/2024  Discharge date: 03/20/24  Discharge Physician: Owen DELENA Lore   PCP: Katina Pfeiffer, PA-C   Recommendations at discharge:  {Tip this will not be part of the note when signed- Example include specific recommendations for outpatient follow-up, pending tests to follow-up on. (Optional):26781}  ***  Discharge Diagnoses: Principal Problem:   Lower GI bleed Active Problems:   Malnutrition of moderate degree  Resolved Problems:   * No resolved hospital problems. [facility] Course: No notes on file  Assessment and Plan: No notes have been filed under this hospital service. Service: Hospitalist     {Tip this will not be part of the note when signed Body mass index is 30.08 kg/m. ,  Nutrition Documentation    Flowsheet Row ED to Hosp-Admission (Current) from 03/18/2024 in [place] 4TH FLOOR PROGRESSIVE CARE AND UROLOGY  Nutrition Problem Moderate Malnutrition  Etiology chronic illness, cancer and cancer related treatments  Nutrition Goal Patient will meet greater than or equal to 90% of their needs  Interventions Tube feeding  ,  (Optional):26781}  {(NOTE) Pain control PDMP Statment (Optional):26782} Consultants: *** Procedures performed: ***  Disposition: {Plan; Disposition:26390} Diet recommendation:  Discharge Diet Orders (From admission, onward)     Start     Ordered   03/20/24 0000  Diet clear liquid        03/20/24 1314           {Diet_Plan:26776} DISCHARGE MEDICATION: Allergies as of 03/20/2024   No Known Allergies      Medication List     STOP taking these medications    olmesartan 5 MG tablet Commonly known as: BENICAR       TAKE these medications    acetaminophen  500 MG tablet Commonly known as: TYLENOL  Take 500-1,000 mg by mouth 2 (two) times daily as needed (pain.).   buPROPion  150 MG 24 hr tablet Commonly  known as: WELLBUTRIN  XL Take 1 tablet (150 mg total) by mouth daily. [place] to patient appt 12/16 What changed: when to take this   lamoTRIgine  150 MG tablet Commonly known as: LAMICTAL  Take 1 tablet (150 mg total) by mouth daily. What changed: when to take this   lidocaine  2 % solution Commonly known as: XYLOCAINE  Use as directed 15 mLs in the mouth or throat as needed for mouth pain. Mix 1 part 2%viscous lidocaine ,1part H2O.Swish and/or swallow 10ml of this mixture,72min before meals and at bedtime, up to QID   LORazepam  0.5 MG tablet Commonly known as: ATIVAN  Take 1 tablet (0.5 mg total) by mouth as needed for anxiety. Take 1 tablet 20 minutes prior to radiation treatment. Please have a driver given sedative effects.   melatonin 5 MG Tabs Take 5 mg by mouth at bedtime as needed (sleep).   metoprolol  succinate 25 MG 24 hr tablet Commonly known as: TOPROL -XL Take 25 mg by mouth every evening.   mirtazapine  15 MG tablet Commonly known as: Remeron  Take 1 tablet (15 mg total) by mouth as needed (for sleep).   Nutren 1.5 Liqd 7 cartons Nutren 1.5/equivalent split over 4 feedings Flush tube with 60 ml water before and after each bolus Give additional 3.5 cups water (830 ml water) via PEG/po to meet hydration needs  Provides 2625 kcal, 119 g, 1337 ml free water (2647 ml total water). 1750 ml/day meets >95% est needs, 100% DRI   nystatin  100000 UNIT/ML suspension Commonly  known as: MYCOSTATIN  Take 5 mLs (500,000 Units total) by mouth 4 (four) times daily.   oxyCODONE  5 MG/5ML solution Commonly known as: ROXICODONE  Take 5 mLs (5 mg total) by mouth every 4 (four) hours as needed for severe pain (pain score 7-10). Take w/ food.   polyethylene glycol 17 g packet Commonly known as: MIRALAX  / GLYCOLAX  Take 17 g by mouth 2 (two) times daily.   promethazine  25 MG tablet Commonly known as: PHENERGAN  Take 1 tablet (25 mg total) by mouth every 6 (six) hours as needed for nausea or  vomiting.   senna-docusate 8.6-50 MG tablet Commonly known as: Senokot-S Take 1 tablet by mouth 2 (two) times daily as needed for mild constipation.   sulfamethoxazole -trimethoprim  800-160 MG tablet Commonly known as: BACTRIM  DS Take 1 tablet by mouth every 12 (twelve) hours for 5 days.        Discharge Exam: Filed Weights   03/18/24 2342 03/20/24 0500  Weight: 92.5 kg 92.4 kg   ***  Condition at discharge: {DC Condition:26389}  The results of significant diagnostics from this hospitalization (including imaging, microbiology, ancillary and laboratory) are listed below for reference.   Imaging Studies: DG Abd 1 View Result Date: 03/20/2024 CLINICAL DATA:  Constipation EXAM: ABDOMEN - 1 VIEW COMPARISON:  CT 03/18/2024 FINDINGS: Bowel gas pattern is than the range of normal. There is not a grossly abnormal amount of fecal matter in the colon. Gastrostomy tube in place. No abnormal calcifications or acute bone findings. Right upper quadrant clips consistent with previous cholecystectomy. IMPRESSION: No acute finding. Gastrostomy tube in place. No evidence of increased stool volume. Electronically Signed   By: Oneil Officer M.D.   On: 03/20/2024 10:08   CT ABDOMEN PELVIS W CONTRAST Result Date: 03/18/2024 CLINICAL DATA:  Acute abdominal pain EXAM: CT ABDOMEN AND PELVIS WITH CONTRAST TECHNIQUE: Multidetector CT imaging of the abdomen and pelvis was performed using the standard protocol following bolus administration of intravenous contrast. RADIATION DOSE REDUCTION: This exam was performed according to the departmental dose-optimization program which includes automated exposure control, adjustment of the mA and/or kV according to patient size and/or use of iterative reconstruction technique. CONTRAST:  OMNIPAQUE  IOHEXOL  300 MG/ML  SOLN COMPARISON:  CT abdomen and pelvis 04/29/2017. FINDINGS: Lower chest: No acute abnormality. Hepatobiliary: No focal liver abnormality is seen. Status post  cholecystectomy. No biliary dilatation. Pancreas: Unremarkable. No pancreatic ductal dilatation or surrounding inflammatory changes. Spleen: Normal in size without focal abnormality. Adrenals/Urinary Tract: Adrenal glands are unremarkable. Kidneys are normal, without renal calculi, focal lesion, or hydronephrosis. Bladder is distended. Stomach/Bowel: Percutaneous gastrostomy tube is in place in the body of the stomach. There is no bowel obstruction, pneumatosis, free air or focal inflammation identified. The appendix is within normal limits. There is a large amount of stool distending the rectum. Vascular/Lymphatic: No significant vascular findings are present. No enlarged abdominal or pelvic lymph nodes. Reproductive: Prostate is unremarkable. Other: There is no ascites. There is subcutaneous fat stranding surrounding the PEG tube without focal fluid collection or air. No abdominal wall hernia. There is mild presacral edema. Musculoskeletal: No fracture is seen. Degenerative changes affect the spine and hips. IMPRESSION: 1. Percutaneous gastrostomy tube in place. There is subcutaneous fat stranding surrounding the PEG tube without focal fluid collection or air. Correlate clinically for cellulitis. 2. Large amount of stool distending the rectum. 3. Distended bladder. 4. Mild presacral edema. Electronically Signed   By: Greig Pique M.D.   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 Difficulty with oral intake during radiation treatment and need for percutaneous gastrostomy tube for nutritional needs. EXAM: PERCUTANEOUS GASTROSTOMY TUBE PLACEMENT ANESTHESIA/SEDATION: Moderate (conscious) sedation was employed during this procedure. A total of Versed  4.0 mg and Fentanyl  200 mcg was administered intravenously. Moderate Sedation Time: 13 minutes. The patient's level of consciousness and  vital signs were monitored continuously by radiology nursing throughout the procedure under my direct supervision. CONTRAST:  20mL OMNIPAQUE  IOHEXOL  300 MG/ML  SOLN MEDICATIONS: 2 g IV Ancef . IV antibiotic was administered in an appropriate time interval prior to needle puncture of the skin. FLUOROSCOPY: Radiation Exposure Index: 16 mGy Kerma. PROCEDURE: The procedure, risks, benefits, and alternatives were explained to the patient. Questions regarding the procedure were encouraged and answered. The patient understands and consents to the procedure. A timeout was performed prior to initiating the procedure. A 5-French catheter was then advanced through the patient's mouth under fluoroscopy into the esophagus and to the level of the stomach. This catheter was used to insufflate the stomach with air under fluoroscopy. The abdominal wall was prepped with chlorhexidine  in a sterile fashion, and a sterile drape was applied covering the operative field. A sterile gown and sterile gloves were used for the procedure. Local anesthesia was provided with 1% Lidocaine . A single T tack was deployed in the gastric lumen after needle puncture and contrast injection. The T tack was brought up to tension at the skin surface. A skin incision was made in the upper abdominal wall adjacent to the T tack insertion. Under fluoroscopy, an 18 gauge trocar needle was advanced into the stomach. Contrast injection was performed to confirm intraluminal position of the needle tip. Over a guidewire, a 9-French sheath was advanced into the lumen of the stomach. The wire was left in place as a safety wire. A loop snare device from a percutaneous gastrostomy kit was then advanced into the stomach. A floppy guide wire was advanced through the orogastric catheter under fluoroscopy in the stomach. The loop snare advanced through the percutaneous gastric access was used to snare the guide wire. This allowed withdrawal of the loop snare out of the  patient's mouth by retraction of the orogastric catheter and wire. A 20-French bumper retention gastrostomy tube was looped around the snare device. It was then pulled back through the patient's mouth. The retention bumper was brought up to the anterior gastric wall. The T tack suture was cut at the skin. The exiting gastrostomy tube was cut to appropriate length and a feeding adapter applied. The catheter was injected with contrast material to confirm position and a fluoroscopic spot image saved. The tube was then flushed with saline. A dressing was applied over the gastrostomy exit site. COMPLICATIONS: None. FINDINGS: The stomach distended well with air allowing safe placement of the gastrostomy tube. After placement, the tip of the gastrostomy tube lies in the body of the stomach. IMPRESSION: Percutaneous gastrostomy with placement of a 20-French bumper retention tube in the body of the stomach. This tube can be used for percutaneous feeds beginning in 24 hours after placement. Electronically Signed   By: Marcey Moan M.D.   On: 03/13/2024 14:59    Microbiology: Results for orders placed or performed during the hospital encounter of 03/18/24  Blood culture (routine x 2)     Status: None (Preliminary result)   Collection Time: 03/18/24 12:22 PM  Specimen: BLOOD RIGHT ARM  Result Value Ref Range Status   Specimen Description   Final    BLOOD RIGHT ARM Performed at [facility], [address] [place], [place] [address]    Special Requests   Final    BOTTLES DRAWN AEROBIC AND ANAEROBIC Blood Culture adequate volume Performed at [facility], [address] [place], [place] [address]    Culture   Final    NO GROWTH 2 DAYS Performed at [facility], [address] [place], [place] [address]    Report Status PENDING  Incomplete  Blood culture (routine x 2)     Status: None (Preliminary result)   Collection Time: 03/18/24 12:22 PM   Specimen: BLOOD LEFT ARM   Result Value Ref Range Status   Specimen Description   Final    BLOOD LEFT ARM Performed at [facility], [address], [place], [place] [address]    Special Requests   Final    BOTTLES DRAWN AEROBIC AND ANAEROBIC Blood Culture results may not be optimal due to an inadequate volume of blood received in culture bottles Performed at [facility], [address], [place], [place] [address]    Culture   Final    NO GROWTH 2 DAYS Performed at [facility], [address], [place], [place] [address]    Report Status PENDING  Incomplete    Labs: CBC: Recent Labs  Lab 03/18/24 0941 03/18/24 1833 03/19/24 0101 03/19/24 0823 03/19/24 1633 03/20/24 0843  WBC 7.1  --  8.1  --   --  5.9  HGB 12.9* 12.6* 11.7* 11.9* 12.9* 12.3*  HCT 40.7 40.0 36.7* 37.5* 41.3 39.0  MCV 88.1  --  88.9  --   --  90.1  PLT 237  --  204  --   --  230   Basic Metabolic Panel: Recent Labs  Lab 03/18/24 0941 03/19/24 0101 03/20/24 0843  NA 132* 136 135  K 4.2 3.9 4.2  CL 99 104 102  CO2 24 24 25   GLUCOSE 130* 104* 100*  BUN 13 10 11   CREATININE 0.83 0.68 0.89  CALCIUM 9.1 8.7* 8.9   Liver Function Tests: Recent Labs  Lab 03/18/24 0941  AST 28  ALT 43  ALKPHOS 71  BILITOT 0.6  PROT 7.2  ALBUMIN 3.6   CBG: Recent Labs  Lab 03/19/24 1948 03/20/24 0138 03/20/24 0402 03/20/24 0724 03/20/24 1110  GLUCAP 121* 98 99 101* 156*    Discharge time spent: {LESS THAN/GREATER THAN:26388} 30 minutes.  Signed: Owen DELENA Lore, MD [facility] 03/20/2024

## 2024-03-20 NOTE — Discharge Summary (Signed)
 Physician Discharge Summary   Patient: Douglas Hester MRN: [id number] DOB: 1961/04/30  Admit date:     03/18/2024  Discharge date: 03/20/24  Discharge Physician: Owen DELENA Lore   PCP: Katina Pfeiffer, PA-C   Recommendations at discharge:    Follow up with Primary GI to discussed needs for colonoscopy Needs CBC to monitor hb Continue with radiation treatments.    Discharge Diagnoses: Principal Problem:   Lower GI bleed Active Problems:   Malnutrition of moderate degree  Resolved Problems:   * No resolved hospital problems. *  Hospital Course: 63 year old with past medical history significant for laryngeal squamous cell carcinoma, undergoing radiation therapy and recent G-tube placement admitted to the hospital with G-tube cellulitis, constipation, associated rectal bleeding.  [name], [name]  [name]  Couple of days ago he noticed redness and warmth around the G-tube site, some associated post drainage.  Denies fever or chills.  He has been taking oxycodone , has not been on stool softener, he has been feeling constipated.  [name]   Admitted with cellulitis around the G-tube, severe constipation, rectal bleeding.    Assessment and Plan: 1-G-tube cellulitis - CT abdomen and pelvis:Percutaneous gastrostomy tube in place. There is subcutaneous fat stranding surrounding the PEG tube without focal fluid collection or air. Correlate clinically for cellulitis. Report worsening pain at site of peg tube, and some drainage. IR consulted for Peg tube evaluation.  Peg functioning, IR recommend continuation of Bactrim .  Discharge on Bactrim  for 5 more days.   Stool impaction: -In setting of Opioids.  Had large BM,  feels better.  Started Miralax  BID, continue senna KUB negative for large amount of stool.  Continue Miralax  at discharge   Rectal bleeding: Likely hemorrhoid in the setting of stool impaction GI was consulted -Hemoglobin relatively stable 11.7 from 12.6 yesterday Monitor  Resolved. Related to impaction. Dr Burnette recommend follow up with primary GI   Laryngeal squamous cell carcinoma receiving radiation by Dr. Izell   Possible Oral thrush Statin  trial.             Consultants: GI Procedures performed: none Disposition: Home Diet recommendation:  Discharge Diet Orders (From admission, onward)     Start     Ordered   03/20/24 0000  Diet clear liquid        03/20/24 1314           Clear liquid diet, advance as tolerated DISCHARGE MEDICATION: Allergies as of 03/20/2024   No Known Allergies      Medication List     STOP taking these medications    olmesartan 5 MG tablet Commonly known as: BENICAR       TAKE these medications    acetaminophen  500 MG tablet Commonly known as: TYLENOL  Take 500-1,000 mg by mouth 2 (two) times daily as needed (pain.).   buPROPion  150 MG 24 hr tablet Commonly known as: WELLBUTRIN  XL Take 1 tablet (150 mg total) by mouth daily. Bridge to patient appt 12/16 What changed: when to take this   lamoTRIgine  150 MG tablet Commonly known as: LAMICTAL  Take 1 tablet (150 mg total) by mouth daily. What changed: when to take this   lidocaine  2 % solution Commonly  known as: XYLOCAINE  Use as directed 15 mLs in the mouth or throat as needed for mouth pain. Mix 1 part 2%viscous lidocaine ,1part H2O.Swish and/or swallow 10ml of this mixture,105min before meals and at bedtime, up to QID   LORazepam  0.5 MG tablet Commonly known as: ATIVAN  Take 1 tablet (0.5 mg total) by mouth as needed for anxiety. Take 1 tablet 20 minutes prior to radiation treatment. Please have a driver given sedative effects.   melatonin 5 MG Tabs Take 5 mg by  mouth at bedtime as needed (sleep).   metoprolol  succinate 25 MG 24 hr tablet Commonly known as: TOPROL -XL Take 25 mg by mouth every evening.   mirtazapine  15 MG tablet Commonly known as: Remeron  Take 1 tablet (15 mg total) by mouth as needed (for sleep).   Nutren 1.5 Liqd 7 cartons Nutren 1.5/equivalent split over 4 feedings Flush tube with 60 ml water before and after each bolus Give additional 3.5 cups water (830 ml water) via PEG/po to meet hydration needs  Provides 2625 kcal, 119 g, 1337 ml free water (2647 ml total water). 1750 ml/day meets >95% est needs, 100% DRI   nystatin  100000 UNIT/ML suspension Commonly known as: MYCOSTATIN  Take 5 mLs (500,000 Units total) by mouth 4 (four) times daily.   oxyCODONE  5 MG/5ML solution Commonly known as: ROXICODONE  Take 5 mLs (5 mg total) by mouth every 4 (four) hours as needed for severe pain (pain score 7-10). Take w/ food.   polyethylene glycol 17 g packet Commonly known as: MIRALAX  / GLYCOLAX  Take 17 g by mouth 2 (two) times daily.   promethazine  25 MG tablet Commonly known as: PHENERGAN  Take 1 tablet (25 mg total) by mouth every 6 (six) hours as needed for nausea or vomiting.   senna-docusate 8.6-50 MG tablet Commonly known as: Senokot-S Take 1 tablet by mouth 2 (two) times daily as needed for mild constipation.   sulfamethoxazole -trimethoprim  800-160 MG tablet Commonly known as: BACTRIM  DS Take 1 tablet by mouth every 12 (twelve) hours for 5 days.        Discharge Exam: Filed Weights   03/18/24 2342 03/20/24 0500  Weight: 92.5 kg 92.4 kg   General; NAD  Condition at discharge: stable  The results of significant diagnostics from this hospitalization (including imaging, microbiology, ancillary and laboratory) are listed below for reference.   Imaging Studies: DG Abd 1 View Result Date: 03/20/2024 CLINICAL DATA:  Constipation EXAM: ABDOMEN - 1 VIEW COMPARISON:  CT 03/18/2024 FINDINGS: Bowel gas pattern is than the  range of normal. There is not a grossly abnormal amount of fecal matter in the colon. Gastrostomy tube in place. No abnormal calcifications or acute bone findings. Right upper quadrant clips consistent with previous cholecystectomy. IMPRESSION: No acute finding. Gastrostomy tube in place. No evidence of increased stool volume. Electronically Signed   By: Oneil Officer M.D.   On: 03/20/2024 10:08   CT ABDOMEN PELVIS W CONTRAST Result Date: 03/18/2024 CLINICAL DATA:  Acute abdominal pain EXAM: CT ABDOMEN AND PELVIS WITH CONTRAST TECHNIQUE: Multidetector CT imaging of the abdomen and pelvis was performed using the standard protocol following bolus administration of intravenous contrast. RADIATION DOSE REDUCTION: This exam was performed according to the departmental dose-optimization program which includes automated exposure control, adjustment of the mA and/or kV according to patient size and/or use of iterative reconstruction technique. CONTRAST:  OMNIPAQUE  IOHEXOL  300 MG/ML  SOLN COMPARISON:  CT abdomen and pelvis 04/29/2017. FINDINGS: Lower chest: No acute abnormality. Hepatobiliary: No focal liver abnormality is  seen. Status post cholecystectomy. No biliary dilatation. Pancreas: Unremarkable. No pancreatic ductal dilatation or surrounding inflammatory changes. Spleen: Normal in size without focal abnormality. Adrenals/Urinary Tract: Adrenal glands are unremarkable. Kidneys are normal, without renal calculi, focal lesion, or hydronephrosis. Bladder is distended. Stomach/Bowel: Percutaneous gastrostomy tube is in place in the body of the stomach. There is no bowel obstruction, pneumatosis, free air or focal inflammation identified. The appendix is within normal limits. There is a large amount of stool distending the rectum. Vascular/Lymphatic: No significant vascular findings are present. No enlarged abdominal or pelvic lymph nodes. Reproductive: Prostate is unremarkable. Other: There is no ascites. There is  subcutaneous fat stranding surrounding the PEG tube without focal fluid collection or air. No abdominal wall hernia. There is mild presacral edema. Musculoskeletal: No fracture is seen. Degenerative changes affect the spine and hips. IMPRESSION: 1. Percutaneous gastrostomy tube in place. There is subcutaneous fat stranding surrounding the PEG tube without focal fluid collection or air. Correlate clinically for cellulitis. 2. Large amount of stool distending the rectum. 3. Distended bladder. 4. Mild presacral edema. Electronically Signed   By: Greig Pique M.D.   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 Difficulty with oral intake during radiation treatment and need for percutaneous gastrostomy tube for nutritional needs. EXAM: PERCUTANEOUS GASTROSTOMY TUBE PLACEMENT ANESTHESIA/SEDATION: Moderate (conscious) sedation was employed during this procedure. A total of Versed  4.0 mg and Fentanyl  200 mcg was administered intravenously. Moderate Sedation Time: 13 minutes. The patient's level of consciousness and vital signs were monitored continuously by radiology nursing throughout the procedure under my direct supervision. CONTRAST:  20mL OMNIPAQUE  IOHEXOL  300 MG/ML  SOLN MEDICATIONS: 2 g IV Ancef . IV antibiotic was administered in an appropriate time interval prior to needle puncture of the skin. FLUOROSCOPY: Radiation Exposure Index: 16 mGy Kerma. PROCEDURE: The procedure, risks, benefits, and alternatives were explained to the patient. Questions regarding the procedure were encouraged and answered. The patient understands and consents to the procedure. A timeout was performed prior to initiating the procedure. A 5-French catheter was then advanced through the patient's mouth under fluoroscopy into the esophagus and to the level of the stomach. This catheter was used to insufflate the  stomach with air under fluoroscopy. The abdominal wall was prepped with chlorhexidine  in a sterile fashion, and a sterile drape was applied covering the operative field. A sterile gown and sterile gloves were used for the procedure. Local anesthesia was provided with 1% Lidocaine . A single T tack was deployed in the gastric lumen after needle puncture and contrast injection. The T tack was brought up to tension at the skin surface. A skin incision was made in the upper abdominal wall adjacent to the T tack insertion. Under fluoroscopy, an 18 gauge trocar needle was advanced into the stomach. Contrast injection was performed to confirm intraluminal position of the needle tip. Over a guidewire, a 9-French sheath was advanced into the lumen of the stomach. The wire was left in place as a safety wire. A loop snare device from a percutaneous gastrostomy kit was then advanced into the stomach. A floppy guide wire was advanced through the orogastric catheter under fluoroscopy in the stomach. The loop snare advanced through the percutaneous gastric access was used to snare the guide wire. This allowed withdrawal of the loop snare out of the patient's mouth by retraction of the orogastric catheter and wire.  A 20-French bumper retention gastrostomy tube was looped around the snare device. It was then pulled back through the patient's mouth. The retention bumper was brought up to the anterior gastric wall. The T tack suture was cut at the skin. The exiting gastrostomy tube was cut to appropriate length and a feeding adapter applied. The catheter was injected with contrast material to confirm position and a fluoroscopic spot image saved. The tube was then flushed with saline. A dressing was applied over the gastrostomy exit site. COMPLICATIONS: None. FINDINGS: The stomach distended well with air allowing safe placement of the gastrostomy tube. After placement, the tip of the gastrostomy tube lies in the body of the stomach.  IMPRESSION: Percutaneous gastrostomy with placement of a 20-French bumper retention tube in the body of the stomach. This tube can be used for percutaneous feeds beginning in 24 hours after placement. Electronically Signed   By: Marcey Moan M.D.   On: 03/13/2024 14:59    Microbiology: Results for orders placed or performed during the hospital encounter of 03/18/24  Blood culture (routine x 2)     Status: None (Preliminary result)   Collection Time: 03/18/24 12:22 PM   Specimen: BLOOD RIGHT ARM  Result Value Ref Range Status   Specimen Description   Final    BLOOD RIGHT ARM Performed at [facility], [address], [place], [place] [address]    Special Requests   Final    BOTTLES DRAWN AEROBIC AND ANAEROBIC Blood Culture adequate volume Performed at [facility], [address] [place], [place] [address]    Culture   Final    NO GROWTH 2 DAYS Performed at [facility], [address] [place], [place] [address]    Report Status PENDING  Incomplete  Blood culture (routine x 2)     Status: None (Preliminary result)   Collection Time: 03/18/24 12:22 PM   Specimen: BLOOD LEFT ARM  Result Value Ref Range Status   Specimen Description   Final    BLOOD LEFT ARM Performed at [facility], [address], [place], [place] [address]    Special Requests   Final    BOTTLES DRAWN AEROBIC AND ANAEROBIC Blood Culture results may not be optimal due to an inadequate volume of blood received in culture bottles Performed at [facility], [address] [place], [place] [address]    Culture   Final    NO GROWTH 2 DAYS Performed at [facility], [address] [place], [place] [address]    Report Status PENDING  Incomplete    Labs: CBC: Recent Labs  Lab 03/18/24 0941 03/18/24 1833 03/19/24 0101 03/19/24 0823 03/19/24 1633 03/20/24 0843  WBC 7.1  --  8.1  --   --  5.9  HGB 12.9* 12.6* 11.7* 11.9* 12.9* 12.3*  HCT 40.7  40.0 36.7* 37.5* 41.3 39.0  MCV 88.1  --  88.9  --   --  90.1  PLT 237  --  204  --   --  230   Basic Metabolic Panel: Recent Labs  Lab 03/18/24 0941 03/19/24 0101 03/20/24 0843  NA 132* 136 135  K 4.2 3.9 4.2  CL 99 104 102  CO2 24 24 25   GLUCOSE 130* 104* 100*  BUN 13 10 11   CREATININE 0.83 0.68 0.89  CALCIUM 9.1 8.7* 8.9   Liver Function Tests: Recent Labs  Lab 03/18/24 0941  AST 28  ALT 43  ALKPHOS 71  BILITOT 0.6  PROT 7.2  ALBUMIN 3.6   CBG: Recent Labs  Lab 03/19/24 1948 03/20/24 0138 03/20/24 0402 03/20/24 0724 03/20/24 1110  GLUCAP 121* 98 99 101* 156*    Discharge time spent: greater than 30 minutes.  Signed: Owen DELENA Lore, MD [facility] 03/20/2024

## 2024-03-21 ENCOUNTER — Other Ambulatory Visit: Payer: Self-pay

## 2024-03-21 ENCOUNTER — Ambulatory Visit
Admission: RE | Admit: 2024-03-21 | Discharge: 2024-03-21 | Disposition: A | Discharge status: Home / Self Care | Source: Ambulatory Visit | Attending: Radiation Oncology | Admitting: Radiation Oncology

## 2024-03-21 DIAGNOSIS — Z510 Encounter for antineoplastic radiation therapy: Secondary | ICD-10-CM | POA: Diagnosis not present

## 2024-03-21 LAB — RAD ONC ARIA SESSION SUMMARY
Course Elapsed Days: 30
Plan Fractions Treated to Date: 21
Plan Prescribed Dose Per Fraction: 2 Gy
Plan Total Fractions Prescribed: 35
Plan Total Prescribed Dose: 70 Gy
Reference Point Dosage Given to Date: 42 Gy
Reference Point Session Dosage Given: 2 Gy
Session Number: 21

## 2024-03-23 LAB — CULTURE, BLOOD (ROUTINE X 2)
Culture: NO GROWTH
Culture: NO GROWTH
Special Requests: ADEQUATE

## 2024-03-24 ENCOUNTER — Ambulatory Visit
Admission: RE | Admit: 2024-03-24 | Discharge: 2024-03-24 | Disposition: A | Discharge status: Home / Self Care | Source: Ambulatory Visit | Attending: Radiation Oncology

## 2024-03-24 ENCOUNTER — Other Ambulatory Visit: Payer: Self-pay

## 2024-03-24 ENCOUNTER — Other Ambulatory Visit: Payer: Self-pay | Admitting: Radiation Oncology

## 2024-03-24 DIAGNOSIS — C099 Malignant neoplasm of tonsil, unspecified: Secondary | ICD-10-CM

## 2024-03-24 DIAGNOSIS — Z510 Encounter for antineoplastic radiation therapy: Secondary | ICD-10-CM | POA: Diagnosis not present

## 2024-03-24 LAB — RAD ONC ARIA SESSION SUMMARY
Course Elapsed Days: 33
Plan Fractions Treated to Date: 22
Plan Prescribed Dose Per Fraction: 2 Gy
Plan Total Fractions Prescribed: 35
Plan Total Prescribed Dose: 70 Gy
Reference Point Dosage Given to Date: 44 Gy
Reference Point Session Dosage Given: 2 Gy
Session Number: 22

## 2024-03-24 MED ORDER — METOCLOPRAMIDE HCL 5 MG/5ML PO SOLN: 10.0000 mg | Freq: Three times a day (TID) | ORAL | 2 refills | Status: DC

## 2024-03-24 MED ORDER — FLUCONAZOLE 10 MG/ML PO SUSR: ORAL | 0 refills | Status: DC

## 2024-03-25 ENCOUNTER — Ambulatory Visit
Admission: RE | Admit: 2024-03-25 | Discharge: 2024-03-25 | Disposition: A | Discharge status: Home / Self Care | Source: Ambulatory Visit | Attending: Radiation Oncology

## 2024-03-25 ENCOUNTER — Other Ambulatory Visit: Payer: Self-pay

## 2024-03-25 DIAGNOSIS — Z510 Encounter for antineoplastic radiation therapy: Secondary | ICD-10-CM | POA: Diagnosis not present

## 2024-03-25 LAB — RAD ONC ARIA SESSION SUMMARY
Course Elapsed Days: 34
Plan Fractions Treated to Date: 23
Plan Prescribed Dose Per Fraction: 2 Gy
Plan Total Fractions Prescribed: 35
Plan Total Prescribed Dose: 70 Gy
Reference Point Dosage Given to Date: 46 Gy
Reference Point Session Dosage Given: 2 Gy
Session Number: 23

## 2024-03-26 ENCOUNTER — Inpatient Hospital Stay: Admitting: Dietician

## 2024-03-26 ENCOUNTER — Other Ambulatory Visit: Payer: Self-pay

## 2024-03-26 ENCOUNTER — Emergency Department (HOSPITAL_BASED_OUTPATIENT_CLINIC_OR_DEPARTMENT_OTHER)

## 2024-03-26 ENCOUNTER — Encounter: Payer: Self-pay | Admitting: Radiation Oncology

## 2024-03-26 ENCOUNTER — Ambulatory Visit
Admission: RE | Admit: 2024-03-26 | Discharge: 2024-03-26 | Disposition: A | Discharge status: Home / Self Care | Source: Ambulatory Visit | Attending: Radiation Oncology | Admitting: Radiation Oncology

## 2024-03-26 ENCOUNTER — Emergency Department (HOSPITAL_BASED_OUTPATIENT_CLINIC_OR_DEPARTMENT_OTHER)
Admission: EM | Admit: 2024-03-26 | Discharge: 2024-03-27 | Disposition: A | Discharge status: Home / Self Care | Attending: Emergency Medicine | Admitting: Emergency Medicine

## 2024-03-26 ENCOUNTER — Encounter (HOSPITAL_BASED_OUTPATIENT_CLINIC_OR_DEPARTMENT_OTHER): Payer: Self-pay

## 2024-03-26 DIAGNOSIS — H1030 Unspecified acute conjunctivitis, unspecified eye: Secondary | ICD-10-CM | POA: Insufficient documentation

## 2024-03-26 DIAGNOSIS — H538 Other visual disturbances: Secondary | ICD-10-CM | POA: Diagnosis present

## 2024-03-26 DIAGNOSIS — H109 Unspecified conjunctivitis: Secondary | ICD-10-CM

## 2024-03-26 LAB — CBC
HCT: 39.2 % (ref 39.0–52.0)
Hemoglobin: 13 g/dL (ref 13.0–17.0)
MCH: 29.1 pg (ref 26.0–34.0)
MCHC: 33.2 g/dL (ref 30.0–36.0)
MCV: 87.7 fL (ref 80.0–100.0)
Platelets: 276 K/uL (ref 150–400)
RBC: 4.47 MIL/uL (ref 4.22–5.81)
RDW: 12.5 % (ref 11.5–15.5)
WBC: 5.1 K/uL (ref 4.0–10.5)
nRBC: 0 % (ref 0.0–0.2)

## 2024-03-26 LAB — COMPREHENSIVE METABOLIC PANEL WITH GFR
ALT: 29 U/L (ref 0–44)
AST: 18 U/L (ref 15–41)
Albumin: 4.3 g/dL (ref 3.5–5.0)
Alkaline Phosphatase: 73 U/L (ref 38–126)
Anion gap: 11 (ref 5–15)
BUN: 10 mg/dL (ref 8–23)
CO2: 25 mmol/L (ref 22–32)
Calcium: 9.6 mg/dL (ref 8.9–10.3)
Chloride: 98 mmol/L (ref 98–111)
Creatinine, Ser: 0.98 mg/dL (ref 0.61–1.24)
GFR, Estimated: >60 mL/min (ref >60)
Glucose, Bld: 99 mg/dL (ref 70–99)
Potassium: 4.5 mmol/L (ref 3.5–5.1)
Sodium: 135 mmol/L (ref 135–145)
Total Bilirubin: 0.5 mg/dL (ref 0.0–1.2)
Total Protein: 7.2 g/dL (ref 6.5–8.1)

## 2024-03-26 LAB — RAD ONC ARIA SESSION SUMMARY
Course Elapsed Days: 35
Plan Fractions Treated to Date: 24
Plan Prescribed Dose Per Fraction: 2 Gy
Plan Total Fractions Prescribed: 35
Plan Total Prescribed Dose: 70 Gy
Reference Point Dosage Given to Date: 48 Gy
Reference Point Session Dosage Given: 2 Gy
Session Number: 24

## 2024-03-26 NOTE — ED Provider Notes (Signed)
 [place] [facility] Provider Note   CSN: [id number] Arrival date & time: 03/26/24  2050     History Chief Complaint  Patient presents with   Blurred Vision    HPI Douglas Hester is a 63 y.o. male presenting for chief complaiunt of weakness and blury vision. States that he was very weak today and then when he fell asleep he struggled to get out of bed. He physically could not open his eyes then when he did he could not see anything for 30 seconds Woke up at 8PM.  All symptoms resolved by 845.  Patient's recorded medical, surgical, social, medication list and allergies were reviewed in the Snapshot window as part of the initial history.   Review of Systems   Review of Systems  Constitutional:  Negative for chills and fever.  HENT:  Negative for ear pain and sore throat.   Eyes:  Positive for visual disturbance. Negative for pain.  Respiratory:  Negative for cough and shortness of breath.   Cardiovascular:  Negative for chest pain and palpitations.  Gastrointestinal:  Negative for abdominal pain and vomiting.  Genitourinary:  Negative for dysuria and hematuria.  Musculoskeletal:  Negative for arthralgias and back pain.  Skin:  Negative for color change and rash.  Neurological:  Negative for seizures and syncope.  All other systems reviewed and are negative.   Physical Exam Updated Vital Signs BP 114/72   Pulse 92   Temp 97.8 F (36.6 C)   Resp 18   Wt 88.9 kg   SpO2 99%   BMI 28.94 kg/m  Physical Exam Vitals and nursing note reviewed.  Constitutional:      General: He is not in acute distress.    Appearance: He is well-developed.  HENT:     Head: Normocephalic and atraumatic.  Eyes:     Conjunctiva/sclera: Conjunctivae normal.  Cardiovascular:     Rate and Rhythm: Normal rate and regular rhythm.     Heart sounds: No murmur heard. Pulmonary:     Effort: Pulmonary effort is normal. No respiratory distress.     Breath  sounds: Normal breath sounds.  Abdominal:     Palpations: Abdomen is soft.     Tenderness: There is no abdominal tenderness.  Musculoskeletal:        General: No swelling.     Cervical back: Neck supple.  Skin:    General: Skin is warm and dry.     Capillary Refill: Capillary refill takes less than 2 seconds.  Neurological:     Mental Status: He is alert.  Psychiatric:        Mood and Affect: Mood normal.      ED Course/ Medical Decision Making/ A&P    Procedures Procedures   Medications Ordered in ED Medications  erythromycin  ophthalmic ointment (1 Application Both Eyes Given 03/27/24 0029)    Medical Decision Making:   Douglas Hester is a 63 y.o. male who presented to the ED today with blurry vision detailed above.    Patient placed on continuous vitals and telemetry monitoring while in ED which was reviewed periodically.  Complete initial physical exam performed, notably the patient  was HDS in NAD.    Reviewed and confirmed nursing documentation for past medical history, family history, social history.    Initial Assessment:   Patient with very nonspecific blurry vision episode.  It happened immediately as he woke up and was accompanied by weakness in his eyes.  He could be  consistent with a myasthenic crisis in the setting of known pancreatic cancer and radiation therapy but he thought that it resolved spontaneously after 30 seconds and was not accompanied by any other muscular weakness.  He has no fatigable symptoms at this time making myasthenic event less likely. Completely asymptomatic at time of my evaluation.  He was observed for 4 and half hours in the ER with no new symptoms.  Head CT was performed to evaluate for intracranial hemorrhage or intracranial vascular abnormality with no acute pathology detected.  All neurologic symptoms are resolved at this time.  Metabolic panel performed with no evidence of metabolic crisis. Discussed with patient, could transfer for  MRI for CVA eval though patient feels resolved and would feel comfortable following up outpatient.  We discussed return precautions and importance of returning if any new symptoms develop or recurred and patient expressed understanding of importance of ongoing care and management.  He stated he would follow-up with his oncologist in the a.m.  Strict return precautions in the interval again reinforced. This is most consistent with an acute life/limb threatening illness complicated by underlying chronic conditions.  Given some vague eye discharge that he recalled, may be some component of conjunctivitis.  Considered UV keratitis as a possible developing etiology given the itchiness but is only exposure to radiation would be his targeted radiotherapy which seems unlikely to cause conjunctivitis.  May be allergic or chemical.  Will treat with Romycin ointment and continue with his outpatient recommended follow-up.  Disposition:  I have considered need for hospitalization, however, considering all of the above, I believe this patient is stable for discharge at this time.  Patient/family educated about specific return precautions for given chief complaint and symptoms.  Patient/family educated about follow-up with PCP.     Patient/family expressed understanding of return precautions and need for follow-up. Patient spoken to regarding all imaging and laboratory results and appropriate follow up for these results. All education provided in verbal form with additional information in written form. Time was allowed for answering of patient questions. Patient discharged.    Emergency Department Medication Summary:   Medications  erythromycin  ophthalmic ointment (1 Application Both Eyes Given 03/27/24 0029)        Clinical Impression:  1. Conjunctivitis, unspecified conjunctivitis type, unspecified laterality      Discharge   Final Clinical Impression(s) / ED Diagnoses Final diagnoses:  Conjunctivitis,  unspecified conjunctivitis type, unspecified laterality    Rx / DC Orders ED Discharge Orders     None         Jerral Meth, MD 03/27/24 [phone]

## 2024-03-26 NOTE — Progress Notes (Signed)
 Nutrition Follow-up:  Pt with SCC of oropharynx, p16+. He is receiving radiation therapy under the care of Dr. Izell. First RT 7/23    S/p PEG 8/14  8/19-8/21 admission with abdominal wall cellulitis  Met with patient in office after therapy. Abdomen is tender, however this is much improved. He reports taking small sips of water by mouth. Patient is not eating orally due to horrible taste. He was started on nystatin  for thrush. This was discontinued due to tolerance. He was started on fluconazole  and reglan  8/26. Patient reports nausea and frequent belching with reflux with Mallie Pinion and asking to be switched back to Osmolite.    Medications: reviewed   Labs: 8/21 reviewed   Anthropometrics: Wt 203.4 lb on 8/25 redia) decreased   8/18 - 207.4 lb  8/11 - 208.4 lb    Estimated Energy Needs  Kcals: 7349-7064 Protein: 123-142 Fluid: >2.6 L  NUTRITION DIAGNOSIS: Inadequate oral intake - addressing with TF   INTERVENTION:  Discussed possibility of nausea related to nystatin  vs TF. He is agreeable to continue KF at this time. Osmolite/Nutren currently on back order - pt to call if nausea continues Suggested taking pepcid  in the AM vs at bedtime Continue HEP as prescribed per SLP Encourage oral intake as tolerated    MONITORING, EVALUATION, GOAL: wt trends, intake   NEXT VISIT: Wednesday September 3 after RT

## 2024-03-26 NOTE — ED Triage Notes (Signed)
 Pt. Reports under going radiation. Had feeding tube (3 feedings today) fell asleep and upon awakening couldn't open eyes. When able to open eye reports vision was blurry in both eyes completely. Admitted to hospital last week d/t feeding tube being infected. Vision is clear at this moment. Denies dizzines but having some weakness and states that normal

## 2024-03-27 ENCOUNTER — Ambulatory Visit
Admission: RE | Admit: 2024-03-27 | Discharge: 2024-03-27 | Disposition: A | Discharge status: Home / Self Care | Source: Ambulatory Visit | Attending: Radiation Oncology | Admitting: Radiation Oncology

## 2024-03-27 ENCOUNTER — Other Ambulatory Visit: Payer: Self-pay

## 2024-03-27 DIAGNOSIS — Z510 Encounter for antineoplastic radiation therapy: Secondary | ICD-10-CM | POA: Diagnosis not present

## 2024-03-27 LAB — RAD ONC ARIA SESSION SUMMARY
Course Elapsed Days: 36
Plan Fractions Treated to Date: 25
Plan Prescribed Dose Per Fraction: 2 Gy
Plan Total Fractions Prescribed: 35
Plan Total Prescribed Dose: 70 Gy
Reference Point Dosage Given to Date: 50 Gy
Reference Point Session Dosage Given: 2 Gy
Session Number: 25

## 2024-03-27 MED ORDER — ERYTHROMYCIN 5 MG/GM OP OINT
TOPICAL_OINTMENT | Freq: Once | OPHTHALMIC | Status: AC
  Administered 2024-03-27: 1 via OPHTHALMIC
  Filled 2024-03-27: qty 3.5

## 2024-03-28 ENCOUNTER — Other Ambulatory Visit: Payer: Self-pay

## 2024-03-28 ENCOUNTER — Ambulatory Visit
Admission: RE | Admit: 2024-03-28 | Discharge: 2024-03-28 | Disposition: A | Discharge status: Home / Self Care | Source: Ambulatory Visit | Attending: Radiation Oncology | Admitting: Radiation Oncology

## 2024-03-28 DIAGNOSIS — Z510 Encounter for antineoplastic radiation therapy: Secondary | ICD-10-CM | POA: Diagnosis not present

## 2024-03-28 LAB — RAD ONC ARIA SESSION SUMMARY
Course Elapsed Days: 37
Plan Fractions Treated to Date: 26
Plan Prescribed Dose Per Fraction: 2 Gy
Plan Total Fractions Prescribed: 35
Plan Total Prescribed Dose: 70 Gy
Reference Point Dosage Given to Date: 52 Gy
Reference Point Session Dosage Given: 2 Gy
Session Number: 26

## 2024-03-30 ENCOUNTER — Other Ambulatory Visit: Payer: Self-pay

## 2024-03-30 ENCOUNTER — Emergency Department (HOSPITAL_BASED_OUTPATIENT_CLINIC_OR_DEPARTMENT_OTHER)
Admission: EM | Admit: 2024-03-30 | Discharge: 2024-03-31 | Disposition: A | Discharge status: Home / Self Care | Attending: Emergency Medicine | Admitting: Emergency Medicine

## 2024-03-30 DIAGNOSIS — Z79899 Other long term (current) drug therapy: Secondary | ICD-10-CM | POA: Insufficient documentation

## 2024-03-30 DIAGNOSIS — R1084 Generalized abdominal pain: Secondary | ICD-10-CM | POA: Insufficient documentation

## 2024-03-30 DIAGNOSIS — K9423 Gastrostomy malfunction: Secondary | ICD-10-CM

## 2024-03-30 DIAGNOSIS — R109 Unspecified abdominal pain: Secondary | ICD-10-CM | POA: Diagnosis present

## 2024-03-30 LAB — COMPREHENSIVE METABOLIC PANEL WITH GFR
ALT: 27 U/L (ref 0–44)
AST: 17 U/L (ref 15–41)
Albumin: 4 g/dL (ref 3.5–5.0)
Alkaline Phosphatase: 68 U/L (ref 38–126)
Anion gap: 10 (ref 5–15)
BUN: 14 mg/dL (ref 8–23)
CO2: 27 mmol/L (ref 22–32)
Calcium: 9.5 mg/dL (ref 8.9–10.3)
Chloride: 102 mmol/L (ref 98–111)
Creatinine, Ser: 1.02 mg/dL (ref 0.61–1.24)
GFR, Estimated: >60 mL/min (ref >60)
Glucose, Bld: 102 mg/dL — ABNORMAL HIGH (ref 70–99)
Potassium: 4.7 mmol/L (ref 3.5–5.1)
Sodium: 139 mmol/L (ref 135–145)
Total Bilirubin: 0.3 mg/dL (ref 0.0–1.2)
Total Protein: 6.9 g/dL (ref 6.5–8.1)

## 2024-03-30 LAB — LIPASE, BLOOD: Lipase: 32 U/L (ref 11–51)

## 2024-03-30 LAB — CBC WITH DIFFERENTIAL/PLATELET
Abs Immature Granulocytes: 0.01 K/uL (ref 0.00–0.07)
Basophils Absolute: 0 K/uL (ref 0.0–0.1)
Basophils Relative: 1 %
Eosinophils Absolute: 0.3 K/uL (ref 0.0–0.5)
Eosinophils Relative: 5 %
HCT: 38.6 % — ABNORMAL LOW (ref 39.0–52.0)
Hemoglobin: 12.5 g/dL — ABNORMAL LOW (ref 13.0–17.0)
Immature Granulocytes: 0 %
Lymphocytes Relative: 8 %
Lymphs Abs: 0.4 K/uL — ABNORMAL LOW (ref 0.7–4.0)
MCH: 28.7 pg (ref 26.0–34.0)
MCHC: 32.4 g/dL (ref 30.0–36.0)
MCV: 88.7 fL (ref 80.0–100.0)
Monocytes Absolute: 0.9 K/uL (ref 0.1–1.0)
Monocytes Relative: 17 %
Neutro Abs: 3.6 K/uL (ref 1.7–7.7)
Neutrophils Relative %: 69 %
Platelets: 240 K/uL (ref 150–400)
RBC: 4.35 MIL/uL (ref 4.22–5.81)
RDW: 12.5 % (ref 11.5–15.5)
WBC: 5.1 K/uL (ref 4.0–10.5)
nRBC: 0 % (ref 0.0–0.2)

## 2024-03-30 MED ORDER — ONDANSETRON HCL 4 MG/2ML IJ SOLN
4.0000 mg | Freq: Once | INTRAMUSCULAR | Status: AC
  Administered 2024-03-30: 4 mg via INTRAVENOUS
  Filled 2024-03-30: qty 2

## 2024-03-30 MED ORDER — MORPHINE SULFATE (PF) 4 MG/ML IV SOLN
4.0000 mg | Freq: Once | INTRAVENOUS | Status: AC
  Administered 2024-03-30: 4 mg via INTRAVENOUS
  Filled 2024-03-30: qty 1

## 2024-03-30 MED ORDER — SODIUM CHLORIDE 0.9 % IV BOLUS
1000.0000 mL | Freq: Once | INTRAVENOUS | Status: AC
  Administered 2024-03-30: 1000 mL via INTRAVENOUS

## 2024-03-30 NOTE — ED Provider Notes (Signed)
 [place] [facility] Provider Note   CSN: [id number] Arrival date & time: 03/30/24  2219     Patient presents with: g-tube pain   Rohail Klees is a 63 y.o. male.   63 yo M with a chief complaints of abdominal pain.  This been going on for about 24 to 48 hours.  Has pain just at the site.  He says it feels like the balloon is almost pulled too tight up against the skin.  He feels like the tube has been working fine.  No fevers.  The pain is similar to when he was diagnosed with a skin infection around the tube site.  This time however he does not have any redness to the skin.  No vomiting.  No fevers.        Prior to Admission medications   Medication Sig Start Date End Date Taking? Authorizing Provider  acetaminophen  (TYLENOL ) 500 MG tablet Take 500-1,000 mg by mouth 2 (two) times daily as needed (pain.).    [provider]  buPROPion  (WELLBUTRIN  XL) 150 MG 24 hr tablet Take 1 tablet (150 mg total) by mouth daily. Bridge to patient appt 12/16 Patient taking differently: Take 150 mg by mouth every evening. Bridge to patient appt 12/16 01/14/24   Arfeen, Leni DASEN, MD  fluconazole  (DIFLUCAN ) 10 MG/ML suspension Take 20mL today, then 10mL daily for 20 days. Ok to take by PEG tube. 03/24/24   Izell Domino, MD  lamoTRIgine  (LAMICTAL ) 150 MG tablet Take 1 tablet (150 mg total) by mouth daily. Patient taking differently: Take 150 mg by mouth every evening. 01/14/24   Arfeen, Leni DASEN, MD  lidocaine  (XYLOCAINE ) 2 % solution Use as directed 15 mLs in the mouth or throat as needed for mouth pain. Mix 1 part 2%viscous lidocaine ,1part H2O.Swish and/or swallow 10ml of this mixture,5min before meals and at bedtime, up to QID 03/05/24   Wyatt Leeroy HERO, PA-C  LORazepam  (ATIVAN ) 0.5 MG tablet Take 1 tablet (0.5 mg total) by mouth as needed for anxiety. Take 1 tablet 20 minutes prior to radiation treatment. Please have a driver given sedative effects. 02/13/24    Wyatt Leeroy HERO, PA-C  Melatonin 5 MG TABS Take 5 mg by mouth at bedtime as needed (sleep).    [provider]  metoCLOPramide  (REGLAN ) 5 MG/5ML solution Take 10 mLs (10 mg total) by mouth 4 (four) times daily -  before meals and at bedtime. Ok to take by tube. 03/24/24   Izell Domino, MD  metoprolol  succinate (TOPROL -XL) 25 MG 24 hr tablet Take 25 mg by mouth every evening. 09/18/22   [provider]  mirtazapine  (REMERON ) 15 MG tablet Take 1 tablet (15 mg total) by mouth as needed (for sleep). 01/14/24 01/13/25  Arfeen, Leni DASEN, MD  Nutritional Supplements (NUTREN 1.5) LIQD 7 cartons Nutren 1.5/equivalent split over 4 feedings Flush tube with 60 ml water before and after each bolus Give additional 3.5 cups water (830 ml water) via PEG/po to meet hydration needs  Provides 2625 kcal, 119 g, 1337 ml free water (2647 ml total water). 1750 ml/day meets >95% est needs, 100% DRI 03/14/24   Izell Domino, MD  oxyCODONE  (ROXICODONE ) 5 MG/5ML solution Take 5 mLs (5 mg total) by mouth every 4 (four) hours as needed for severe pain (pain score 7-10). Take w/ food. 03/10/24   Izell Domino, MD  polyethylene glycol (MIRALAX  / GLYCOLAX ) 17 g packet Take 17 g by mouth 2 (two) times daily.  03/20/24   Regalado, Owen A, MD  promethazine  (PHENERGAN ) 25 MG tablet Take 1 tablet (25 mg total) by mouth every 6 (six) hours as needed for nausea or vomiting. 03/17/24   Izell Domino, MD    Allergies: Patient has no known allergies.    Review of Systems  Updated Vital Signs BP 138/83 (BP Location: Right Arm)   Pulse 99   Temp 98.1 F (36.7 C) (Oral)   Resp 18   Ht 5' 9 (1.753 m)   Wt 88.9 kg   SpO2 96%   BMI 28.94 kg/m   Physical Exam Vitals and nursing note reviewed.  Constitutional:      Appearance: He is well-developed.  HENT:     Head: Normocephalic and atraumatic.  Eyes:     Pupils: Pupils are equal, round, and reactive to light.  Neck:     Vascular: No JVD.  Cardiovascular:     Rate  and Rhythm: Normal rate and regular rhythm.     Heart sounds: No murmur heard.    No friction rub. No gallop.  Pulmonary:     Effort: No respiratory distress.     Breath sounds: No wheezing.  Abdominal:     General: There is no distension.     Tenderness: There is no abdominal tenderness. There is no guarding or rebound.     Comments: No significant tenderness on palpation of the abdomen.  He has some mild tenderness with movements of the G-tube along the track.  I do not appreciate any significant erythema or drainage.  Musculoskeletal:        General: Normal range of motion.     Cervical back: Normal range of motion and neck supple.  Skin:    Coloration: Skin is not pale.     Findings: No rash.  Neurological:     Mental Status: He is alert and oriented to person, place, and time.  Psychiatric:        Behavior: Behavior normal.     (all labs ordered are listed, but only abnormal results are displayed) Labs Reviewed  CBC WITH DIFFERENTIAL/PLATELET - Abnormal; Notable for the following components:      Result Value   Hemoglobin 12.5 (*)    HCT 38.6 (*)    Lymphs Abs 0.4 (*)    All other components within normal limits  COMPREHENSIVE METABOLIC PANEL WITH GFR  LIPASE, BLOOD    EKG: None  Radiology: No results found.   Procedures   Medications Ordered in the ED  morphine  (PF) 4 MG/ML injection 4 mg (has no administration in time range)  ondansetron  (ZOFRAN ) injection 4 mg (has no administration in time range)  sodium chloride  0.9 % bolus 1,000 mL (1,000 mLs Intravenous New Bag/Given 03/30/24 2312)                                    Medical Decision Making Amount and/or Complexity of Data Reviewed Labs: ordered. Radiology: ordered.  Risk Prescription drug management.   63 yo M with a chief complaints of abdominal pain at his tube site.  This been going on for a day or 2.  He was recently admitted for cellulitis at the G-tube site.  He thinks it feels similar  but does not have the same skin changes.  I discussed different options with the patient at bedside.  Offered trial of oral antibiotics and follow-up with his surgeon in  clinic versus CT imaging and laboratory evaluation here.  He is electing for the latter.  Patient care signed out to Dr. Geroldine, please see their note for further details of care in the ED.  The patients results and plan were reviewed and discussed.   Any x-rays performed were independently reviewed by myself.   Differential diagnosis were considered with the presenting HPI.  Medications  morphine  (PF) 4 MG/ML injection 4 mg (has no administration in time range)  ondansetron  (ZOFRAN ) injection 4 mg (has no administration in time range)  sodium chloride  0.9 % bolus 1,000 mL (1,000 mLs Intravenous New Bag/Given 03/30/24 2312)    Vitals:   03/30/24 2225 03/30/24 2228 03/30/24 2229 03/30/24 2233  BP: 138/83 138/83    Pulse: (!) 106 99    Resp:  18    Temp:    98.1 F (36.7 C)  TempSrc:    Oral  SpO2: 96% 96%    Weight:   88.9 kg   Height:   5' 9 (1.753 m)     Final diagnoses:  Generalized abdominal pain        Final diagnoses:  Generalized abdominal pain    ED Discharge Orders     None          Emil Share, DO 03/30/24 2324

## 2024-03-30 NOTE — ED Triage Notes (Signed)
 Pt to rm 1 with steady gait. Reports worsening gtube pain and redness. States I think its getting infected gtube site appears slightly red, irritated. No drainage or swelling present. Denies problems with function of gtube.

## 2024-03-31 ENCOUNTER — Emergency Department (HOSPITAL_BASED_OUTPATIENT_CLINIC_OR_DEPARTMENT_OTHER)

## 2024-03-31 ENCOUNTER — Other Ambulatory Visit (HOSPITAL_BASED_OUTPATIENT_CLINIC_OR_DEPARTMENT_OTHER)

## 2024-03-31 ENCOUNTER — Encounter (HOSPITAL_BASED_OUTPATIENT_CLINIC_OR_DEPARTMENT_OTHER): Payer: Self-pay

## 2024-03-31 MED ORDER — IOHEXOL 300 MG/ML  SOLN
100.0000 mL | Freq: Once | INTRAMUSCULAR | Status: AC | PRN
  Administered 2024-03-31: 100 mL via INTRAVENOUS

## 2024-03-31 NOTE — ED Provider Notes (Signed)
  Physical Exam  BP 138/83 (BP Location: Right Arm)   Pulse 99   Temp 98.1 F (36.7 C) (Oral)   Resp 18   Ht 5' 9 (1.753 m)   Wt 88.9 kg   SpO2 96%   BMI 28.94 kg/m   Physical Exam Vitals and nursing note reviewed.  Constitutional:      Appearance: Normal appearance.  Pulmonary:     Effort: Pulmonary effort is normal.  Abdominal:     Comments: The G-tube site was evaluated.  The tube is located at approximately 2 cm from the skin.  There is no significant erythema or purulent drainage at the site.  Skin:    General: Skin is warm and dry.  Neurological:     Mental Status: He is alert and oriented to person, place, and time.     Procedures  Procedures  ED Course / MDM    Medical Decision Making Amount and/or Complexity of Data Reviewed Labs: ordered. Radiology: ordered.  Risk Prescription drug management.   Care assumed from Dr. Emil at shift change.  Patient awaiting results of CT scan to further evaluate the condition of his G-tube.  CT scan has reported and shows the G-tube has been pulled into the abdominal wall.  This finding was discussed with Dr. Hughes from interventional radiology.  He tells me to leave the tube as is and that someone will reach out to him tomorrow to arrange a procedure to either salvage the tube or swap it out right.         Geroldine Berg, MD 03/31/24 [phone]

## 2024-03-31 NOTE — ED Notes (Signed)
 IR to call pt today to discuss further planning of Gtube repair. DC instructions given, pt verbalized understanding. Out of ED with steady gait, not in visible distress.

## 2024-03-31 NOTE — Discharge Instructions (Signed)
 Someone from interventional radiology will call you tomorrow to make arrangements for either salvage or replacement of the tube.  Go to [employer] if you develop any new or concerning issues.

## 2024-04-01 ENCOUNTER — Other Ambulatory Visit: Payer: Self-pay

## 2024-04-01 ENCOUNTER — Other Ambulatory Visit (HOSPITAL_COMMUNITY): Payer: Self-pay | Admitting: Interventional Radiology

## 2024-04-01 ENCOUNTER — Other Ambulatory Visit: Payer: Self-pay | Admitting: Radiation Oncology

## 2024-04-01 ENCOUNTER — Telehealth: Payer: Self-pay | Admitting: *Deleted

## 2024-04-01 ENCOUNTER — Ambulatory Visit
Admission: RE | Admit: 2024-04-01 | Discharge: 2024-04-01 | Disposition: A | Discharge status: Home / Self Care | Source: Ambulatory Visit | Attending: Radiation Oncology

## 2024-04-01 DIAGNOSIS — C4442 Squamous cell carcinoma of skin of scalp and neck: Secondary | ICD-10-CM | POA: Diagnosis present

## 2024-04-01 DIAGNOSIS — C099 Malignant neoplasm of tonsil, unspecified: Secondary | ICD-10-CM | POA: Diagnosis not present

## 2024-04-01 DIAGNOSIS — R633 Feeding difficulties, unspecified: Secondary | ICD-10-CM

## 2024-04-01 DIAGNOSIS — Z510 Encounter for antineoplastic radiation therapy: Secondary | ICD-10-CM | POA: Diagnosis present

## 2024-04-01 DIAGNOSIS — C770 Secondary and unspecified malignant neoplasm of lymph nodes of head, face and neck: Secondary | ICD-10-CM | POA: Diagnosis not present

## 2024-04-01 LAB — RAD ONC ARIA SESSION SUMMARY
Course Elapsed Days: 41
Plan Fractions Treated to Date: 27
Plan Prescribed Dose Per Fraction: 2 Gy
Plan Total Fractions Prescribed: 35
Plan Total Prescribed Dose: 70 Gy
Reference Point Dosage Given to Date: 54 Gy
Reference Point Session Dosage Given: 2 Gy
Session Number: 27

## 2024-04-01 MED ORDER — SODIUM CHLORIDE 0.9 % IV SOLN
INTRAVENOUS | Status: DC
  Filled 2024-04-01 (×2): qty 150

## 2024-04-01 MED ORDER — METOCLOPRAMIDE HCL 5 MG/5ML PO SOLN: 10.0000 mg | Freq: Three times a day (TID) | ORAL | 2 refills | Status: DC

## 2024-04-01 MED ORDER — SODIUM CHLORIDE 0.9 % IV SOLN
INTRAVENOUS | Status: DC
  Filled 2024-04-01: qty 150

## 2024-04-01 NOTE — Telephone Encounter (Signed)
 Douglas Hester's brother-n-law in to see this nurse.  Asked if [employer] has sent a disability renewal form.  His disability ends this Friday, 04/04/2024.  No form received. Voicemail from [place] with [employer] [phone]).  Calling about claim number [id number].  August 27th, 2025, we faxed a request for medical records from February 29, 2024 to present with an attending physician form to fax number: [phone].  [place], attention [name].  Fax form and records to [phone].  Call for any questions. Returned call connected with [employer].  Advised of no receipt of ant forms.  [employer] still using the incorrect fax number.  Provided [phone]  Provided form turnover time frame up to fourteen days followed by (SW) H.I.M policy of up to thirty days.  No further questions of [employer].  This nurse asked Honor to call back fax number provided.  We have the same number [id number]  Advised this is incorrect.  Our toll free fax begins with one no. [phone] eight followed by two number ([phone]'s) fours, .[phone].

## 2024-04-02 ENCOUNTER — Ambulatory Visit
Admission: RE | Admit: 2024-04-02 | Discharge: 2024-04-02 | Disposition: A | Discharge status: Home / Self Care | Source: Ambulatory Visit | Attending: Radiation Oncology | Admitting: Radiation Oncology

## 2024-04-02 ENCOUNTER — Other Ambulatory Visit: Payer: Self-pay

## 2024-04-02 ENCOUNTER — Other Ambulatory Visit (HOSPITAL_COMMUNITY): Payer: Self-pay | Admitting: Interventional Radiology

## 2024-04-02 ENCOUNTER — Ambulatory Visit (HOSPITAL_COMMUNITY)
Admission: RE | Admit: 2024-04-02 | Discharge: 2024-04-02 | Disposition: A | Discharge status: Home / Self Care | Source: Ambulatory Visit | Attending: Interventional Radiology | Admitting: Interventional Radiology

## 2024-04-02 ENCOUNTER — Inpatient Hospital Stay: Admitting: Dietician

## 2024-04-02 DIAGNOSIS — R633 Feeding difficulties, unspecified: Secondary | ICD-10-CM | POA: Insufficient documentation

## 2024-04-02 DIAGNOSIS — C099 Malignant neoplasm of tonsil, unspecified: Secondary | ICD-10-CM

## 2024-04-02 DIAGNOSIS — Z510 Encounter for antineoplastic radiation therapy: Secondary | ICD-10-CM | POA: Diagnosis not present

## 2024-04-02 HISTORY — PX: IR GASTROSTOMY TUBE REMOVAL: IMG5492

## 2024-04-02 LAB — RAD ONC ARIA SESSION SUMMARY
Course Elapsed Days: 42
Plan Fractions Treated to Date: 28
Plan Prescribed Dose Per Fraction: 2 Gy
Plan Total Fractions Prescribed: 35
Plan Total Prescribed Dose: 70 Gy
Reference Point Dosage Given to Date: 56 Gy
Reference Point Session Dosage Given: 2 Gy
Session Number: 28

## 2024-04-02 MED ORDER — LIDOCAINE VISCOUS HCL 2 % MT SOLN: 5.0000 mL | Freq: Once | OROMUCOSAL | Status: DC

## 2024-04-02 MED ORDER — LIDOCAINE VISCOUS HCL 2 % MT SOLN
OROMUCOSAL | Status: AC
Start: 2024-04-02 — End: 2024-04-02
  Filled 2024-04-02: qty 15

## 2024-04-02 NOTE — Progress Notes (Signed)
 Planning to see patient for nutrition follow-up. Patient seen in IR today for evaluation of feeding tube. Per secure chat, tube removed as it is not in the stomach. Planning to have new Gtube placed 9/4 at [facility]. Nutrition follow-up rescheduled. Spoke with brother of patient Fairy) to confirm.   Will plan to see patient Friday 9/5 before radiation.

## 2024-04-02 NOTE — Procedures (Signed)
 Pt's 20 fr bumper retention gastrostomy tube was removed in its entirety without immediate complications due to tip migration into ant abd wall, not stomach. Gauze dressing applied to site. EBL none. Pt to be scheduled for new G tube placement at [facility] on 9/4.

## 2024-04-03 ENCOUNTER — Ambulatory Visit (HOSPITAL_COMMUNITY)
Admission: RE | Admit: 2024-04-03 | Discharge: 2024-04-03 | Disposition: A | Discharge status: Home / Self Care | Source: Ambulatory Visit | Attending: Radiation Oncology | Admitting: Radiation Oncology

## 2024-04-03 ENCOUNTER — Ambulatory Visit

## 2024-04-03 ENCOUNTER — Other Ambulatory Visit: Payer: Self-pay

## 2024-04-03 VITALS — BP 106/91 | HR 93 | Resp 25

## 2024-04-03 DIAGNOSIS — C099 Malignant neoplasm of tonsil, unspecified: Secondary | ICD-10-CM | POA: Insufficient documentation

## 2024-04-03 DIAGNOSIS — I10 Essential (primary) hypertension: Secondary | ICD-10-CM | POA: Insufficient documentation

## 2024-04-03 DIAGNOSIS — K219 Gastro-esophageal reflux disease without esophagitis: Secondary | ICD-10-CM | POA: Diagnosis not present

## 2024-04-03 HISTORY — PX: IR GASTROSTOMY TUBE MOD SED: IMG625

## 2024-04-03 LAB — CBC
HCT: 44 % (ref 39.0–52.0)
Hemoglobin: 14.3 g/dL (ref 13.0–17.0)
MCH: 28.9 pg (ref 26.0–34.0)
MCHC: 32.5 g/dL (ref 30.0–36.0)
MCV: 89.1 fL (ref 80.0–100.0)
Platelets: 241 K/uL (ref 150–400)
RBC: 4.94 MIL/uL (ref 4.22–5.81)
RDW: 12.3 % (ref 11.5–15.5)
WBC: 5.4 K/uL (ref 4.0–10.5)
nRBC: 0 % (ref 0.0–0.2)

## 2024-04-03 LAB — PROTIME-INR
INR: 1 (ref 0.8–1.2)
Prothrombin Time: 14.2 s (ref 11.4–15.2)

## 2024-04-03 MED ORDER — FENTANYL CITRATE (PF) 100 MCG/2ML IJ SOLN
INTRAMUSCULAR | Status: AC
  Filled 2024-04-03: qty 2

## 2024-04-03 MED ORDER — CEFAZOLIN SODIUM-DEXTROSE 2-4 GM/100ML-% IV SOLN
INTRAVENOUS | Status: AC
  Filled 2024-04-03: qty 100

## 2024-04-03 MED ORDER — CEFAZOLIN SODIUM-DEXTROSE 2-4 GM/100ML-% IV SOLN
INTRAVENOUS | Status: AC | PRN
  Administered 2024-04-03: 2 g via INTRAVENOUS

## 2024-04-03 MED ORDER — DIPHENHYDRAMINE HCL 50 MG/ML IJ SOLN
INTRAMUSCULAR | Status: AC
  Filled 2024-04-03: qty 1

## 2024-04-03 MED ORDER — DICYCLOMINE HCL 10 MG PO CAPS
10.0000 mg | ORAL_CAPSULE | Freq: Once | ORAL | Status: AC
  Administered 2024-04-03: 10 mg via ORAL
  Filled 2024-04-03 (×2): qty 1

## 2024-04-03 MED ORDER — LIDOCAINE-EPINEPHRINE 1 %-1:100000 IJ SOLN
20.0000 mL | Freq: Once | INTRAMUSCULAR | Status: AC
  Administered 2024-04-03: 10 mL via INTRADERMAL

## 2024-04-03 MED ORDER — FENTANYL CITRATE (PF) 100 MCG/2ML IJ SOLN
INTRAMUSCULAR | Status: AC | PRN
  Administered 2024-04-03: 50 ug via INTRAVENOUS

## 2024-04-03 MED ORDER — LIDOCAINE VISCOUS HCL 2 % MT SOLN
OROMUCOSAL | Status: AC
  Filled 2024-04-03: qty 15

## 2024-04-03 MED ORDER — MIDAZOLAM HCL 2 MG/2ML IJ SOLN
INTRAMUSCULAR | Status: AC | PRN
  Administered 2024-04-03: .5 mg via INTRAVENOUS
  Administered 2024-04-03: 1 mg via INTRAVENOUS

## 2024-04-03 MED ORDER — DIPHENHYDRAMINE HCL 50 MG/ML IJ SOLN
INTRAMUSCULAR | Status: AC | PRN
  Administered 2024-04-03: 50 mg via INTRAVENOUS

## 2024-04-03 MED ORDER — ALUM & MAG HYDROXIDE-SIMETH 200-200-20 MG/5ML PO SUSP
30.0000 mL | Freq: Once | ORAL | Status: AC
  Administered 2024-04-03: 30 mL via ORAL
  Filled 2024-04-03 (×2): qty 30

## 2024-04-03 MED ORDER — SODIUM CHLORIDE 0.9 % IV SOLN: INTRAVENOUS | Status: DC

## 2024-04-03 MED ORDER — IOHEXOL 300 MG/ML  SOLN
50.0000 mL | Freq: Once | INTRAMUSCULAR | Status: AC | PRN
  Administered 2024-04-03: 10 mL

## 2024-04-03 MED ORDER — LIDOCAINE VISCOUS HCL 2 % MT SOLN
OROMUCOSAL | Status: AC | PRN
  Administered 2024-04-03: 15 mL via OROMUCOSAL

## 2024-04-03 MED ORDER — MIDAZOLAM HCL 2 MG/2ML IJ SOLN
INTRAMUSCULAR | Status: AC
Start: 2024-04-03 — End: 2024-04-03
  Filled 2024-04-03: qty 2

## 2024-04-03 MED ORDER — LIDOCAINE-EPINEPHRINE 1 %-1:100000 IJ SOLN
INTRAMUSCULAR | Status: AC
  Filled 2024-04-03: qty 1

## 2024-04-03 MED ORDER — OXYCODONE HCL 5 MG PO TABS
5.0000 mg | ORAL_TABLET | Freq: Once | ORAL | Status: AC
  Administered 2024-04-03: 5 mg via ORAL
  Filled 2024-04-03: qty 1

## 2024-04-03 MED ORDER — HYOSCYAMINE SULFATE 0.125 MG SL SUBL
0.2500 mg | SUBLINGUAL_TABLET | Freq: Once | SUBLINGUAL | Status: AC
  Administered 2024-04-03: 0.25 mg via SUBLINGUAL
  Filled 2024-04-03 (×2): qty 2

## 2024-04-03 NOTE — Procedures (Signed)
 Interventional Radiology Procedure Note  Procedure: Placement of percutaneous 50F balloon-retention gastrostomy tube.  Complications: None  EBL:  < 5 mL  Recommendations: - NPO for 4 hours after placement - Maintain G-tube to low wall suction for 4 hours after placement - May advance diet as tolerated and begin using tube 4 hours after placement - Gastropexy sutures are absorbable and do not require removal  Marliss Coots, MD Pager: [phone]

## 2024-04-03 NOTE — H&P (Signed)
 Chief Complaint: Squamous cell carcinoma of left tonsil - IR consulted for percutaneous gastrostomy tube placement  Referring Provider(s): Squire,Sarah   Supervising Physician: Jennefer Rover  Patient Status: [facility] - Out-pt  History of Present Illness: Douglas Hester is a 63 y.o. male with pmhx of chronic GERD, HTN presents after recent diagnosis of Squamous cell carcinoma of left tonsil who is referred to IR for percutaneous gastrostomy tube placement. Pt had initial gtube placement on 03/13/24 with IR, but this became dislodged and painful, and removed entirely yesterday, 9/3. Now presenting back for replacement.   Has been NPO. Has some concerns over his gagging he experienced from last placement and reassurance provided all measures to improve this would be taken. Having throat discomfort which is chronic since being on chemo and radiation for throat cancer, not having any abd pain or other concerns. All other questions answered.   Patient is Full Code  Past Medical History:  Diagnosis Date   Anxiety    Chronic GERD    Heart murmur    Hypertension    Tachycardia     Past Surgical History:  Procedure Laterality Date   CHOLECYSTECTOMY N/A 10/11/2018   Procedure: LAPAROSCOPIC CHOLECYSTECTOMY WITH INTRAOPERATIVE CHOLANGIOGRAM;  Surgeon: Curvin Deward MOULD, MD;  Location: [facility] ORS;  Service: General;  Laterality: N/A;   COLONOSCOPY  ~ 2014   done by GI in [place]. Polyps were removed. Patient not aware what type of polyps these were   DIRECT LARYNGOSCOPY N/A 01/28/2024   Procedure: LARYNGOSCOPY, DIRECT WITH BIOPSIES;  Surgeon: Tobie Eldora NOVAK, MD;  Location: [facility] OR;  Service: ENT;  Laterality: N/A;   ESOPHAGOGASTRODUODENOSCOPY  early 1990s   MD found ulcer   INGUINAL HERNIA REPAIR Left ~ 2010   with Mesh.    IR GASTROSTOMY TUBE MOD SED  03/13/2024   IR GASTROSTOMY TUBE REMOVAL  04/02/2024   VASECTOMY      Allergies: Patient has no known allergies.  Medications: Prior to  Admission medications   Medication Sig Start Date End Date Taking? Authorizing Provider  acetaminophen  (TYLENOL ) 500 MG tablet Take 500-1,000 mg by mouth 2 (two) times daily as needed (pain.).   Yes [provider]  buPROPion  (WELLBUTRIN  XL) 150 MG 24 hr tablet Take 1 tablet (150 mg total) by mouth daily. Bridge to patient appt 12/16 Patient taking differently: Take 150 mg by mouth every evening. Bridge to patient appt 12/16 01/14/24  Yes Arfeen, Leni DASEN, MD  fluconazole  (DIFLUCAN ) 10 MG/ML suspension Take 20mL today, then 10mL daily for 20 days. Ok to take by PEG tube. 03/24/24  Yes Izell Domino, MD  lamoTRIgine  (LAMICTAL ) 150 MG tablet Take 1 tablet (150 mg total) by mouth daily. Patient taking differently: Take 150 mg by mouth every evening. 01/14/24  Yes Arfeen, Leni DASEN, MD  lidocaine  (XYLOCAINE ) 2 % solution Use as directed 15 mLs in the mouth or throat as needed for mouth pain. Mix 1 part 2%viscous lidocaine ,1part H2O.Swish and/or swallow 10ml of this mixture,63min before meals and at bedtime, up to QID 03/05/24  Yes Jerolyn Flenniken Leeroy HERO, PA-C  LORazepam  (ATIVAN ) 0.5 MG tablet Take 1 tablet (0.5 mg total) by mouth as needed for anxiety. Take 1 tablet 20 minutes prior to radiation treatment. Please have a driver given sedative effects. 02/13/24  Yes Kemiah Booz Leeroy HERO, PA-C  Melatonin 5 MG TABS Take 5 mg by mouth at bedtime as needed (sleep).   Yes [provider]  metoCLOPramide  (REGLAN ) 5 MG/5ML solution Take 10  mLs (10 mg total) by mouth 4 (four) times daily -  before meals and at bedtime. Ok to take by tube. If you have 4 meals a day, do not take at bedtime. 04/01/24  Yes Izell Domino, MD  metoprolol  succinate (TOPROL -XL) 25 MG 24 hr tablet Take 25 mg by mouth every evening. 09/18/22  Yes [provider]  mirtazapine  (REMERON ) 15 MG tablet Take 1 tablet (15 mg total) by mouth as needed (for sleep). 01/14/24 01/13/25 Yes Arfeen, Leni DASEN, MD  Nutritional Supplements (NUTREN 1.5) LIQD 7  cartons Nutren 1.5/equivalent split over 4 feedings Flush tube with 60 ml water before and after each bolus Give additional 3.5 cups water (830 ml water) via PEG/po to meet hydration needs  Provides 2625 kcal, 119 g, 1337 ml free water (2647 ml total water). 1750 ml/day meets >95% est needs, 100% DRI 03/14/24  Yes Izell Domino, MD  oxyCODONE  (ROXICODONE ) 5 MG/5ML solution Take 5 mLs (5 mg total) by mouth every 4 (four) hours as needed for severe pain (pain score 7-10). Take w/ food. 03/10/24  Yes Izell Domino, MD  polyethylene glycol (MIRALAX  / GLYCOLAX ) 17 g packet Take 17 g by mouth 2 (two) times daily. 03/20/24  Yes Regalado, Belkys A, MD  promethazine  (PHENERGAN ) 25 MG tablet Take 1 tablet (25 mg total) by mouth every 6 (six) hours as needed for nausea or vomiting. 03/17/24  Yes Izell Domino, MD     Family History  Problem Relation Age of Onset   Lung cancer Mother    Lymphoma Father    Valvular heart disease Sister     Social History   Socioeconomic History   Marital status: Divorced    Spouse name: Not on file   Number of children: 0   Years of education: Not on file   Highest education level: Not on file  Occupational History   Not on file  Tobacco Use   Smoking status: Never   Smokeless tobacco: Never  Vaping Use   Vaping status: Never Used  Substance and Sexual Activity   Alcohol use: Yes    Comment: rare   Drug use: No   Sexual activity: Not on file  Other Topics Concern   Not on file  Social History Narrative   Not on file   Social Drivers of Health   Financial Resource Strain: Not on file  Food Insecurity: No Food Insecurity (03/18/2024)   Hunger Vital Sign    Worried About Running Out of Food in the Last Year: Never true    Ran Out of Food in the Last Year: Never true  Transportation Needs: No Transportation Needs (03/18/2024)   PRAPARE - Administrator, Civil Service (Medical): No    Lack of Transportation (Non-Medical): No  Physical Activity:  Not on file  Stress: Not on file  Social Connections: Not on file     Review of Systems: A 12 point ROS discussed and pertinent positives are indicated in the HPI above.  All other systems are negative.  Review of Systems  HENT:  Positive for sore throat (chronic since started chemo and radiation).     Vital Signs: BP 114/86   Pulse 100   Resp 20   SpO2 97%   Advance Care Plan: No documents on file  Physical Exam Vitals and nursing note reviewed.  Constitutional:      General: He is not in acute distress. HENT:     Mouth/Throat:     Mouth:  Mucous membranes are dry.     Pharynx: Oropharynx is clear.  Cardiovascular:     Rate and Rhythm: Regular rhythm. Tachycardia present.  Pulmonary:     Effort: Pulmonary effort is normal.     Breath sounds: Normal breath sounds.  Abdominal:     Palpations: Abdomen is soft.     Tenderness: There is no abdominal tenderness.  Musculoskeletal:     Right lower leg: No edema.     Left lower leg: No edema.  Skin:    General: Skin is warm and dry.  Neurological:     Mental Status: He is alert and oriented to person, place, and time. Mental status is at baseline.     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 He has had some recent episodes of persistent nausea and vomiting along with abdominal pain at G-tube site and recent CT scan showing retraction of gastrostomy tube with retention bumper in the intramuscular wall of the anterior abdomen. He presents today for gastrostomy tube removal. EXAM: GASTROSTOMY TUBE REMOVAL MEDICATIONS: Viscous lidocaine  into gastrostomy tube insertion site tract ANESTHESIA/SEDATION: None CONTRAST:  None FLUOROSCOPY: None COMPLICATIONS: None immediate. PROCEDURE: Informed consent was obtained from the patient after a thorough discussion of the  procedural risks, benefits and alternatives. All questions were addressed. A timeout was performed prior to the initiation of the procedure. Viscous lidocaine  was applied into the gastrostomy tube insertion site tract. Using manual traction the tube was removed in its entirety without immediate complications. Gauze dressing applied over site. Site care instructions reviewed with patient. IMPRESSION: 20 [place] bumper retention gastrostomy tube removal as discussed above. Performed by: Franky Rakers, PA-C Electronically Signed   By: Cordella Banner   On: 04/02/2024 12:46   CT ABDOMEN PELVIS W CONTRAST Result Date: 03/31/2024 CLINICAL DATA:  Acute abdominal pain. EXAM: CT ABDOMEN AND PELVIS WITH CONTRAST TECHNIQUE: Multidetector CT imaging of the abdomen and pelvis was performed using the standard protocol following bolus administration of intravenous contrast. RADIATION DOSE REDUCTION: This exam was performed according to the departmental dose-optimization program which includes automated exposure control, adjustment of the mA and/or kV according to patient size and/or use of iterative reconstruction technique. CONTRAST:  OMNIPAQUE  IOHEXOL  300 MG/ML  SOLN COMPARISON:  CT abdomen and pelvis 03/18/2024 FINDINGS: Lower chest: No acute abnormality. Hepatobiliary: No focal liver abnormality is seen. Status post cholecystectomy. No biliary dilatation. Pancreas: Unremarkable. No pancreatic ductal dilatation or surrounding inflammatory changes. Spleen: Normal in size without focal abnormality. Adrenals/Urinary Tract: Adrenal glands are unremarkable. Kidneys are normal, without renal calculi, focal lesion, or hydronephrosis. Bladder is unremarkable. Stomach/Bowel: Percutaneous gastrostomy tube has been pulled out of the stomach in the interval. The balloon tip is in the intramuscular wall of the anterior abdomen. There some mild surrounding subcutaneous stranding surrounding the catheter. Otherwise, the stomach is  within normal limits. Appendix appears normal. No evidence of bowel wall thickening, distention, or inflammatory changes. There are few sigmoid colon diverticula. There is a surgical clip in the sigmoid colon. Vascular/Lymphatic: No significant vascular findings are present. No enlarged abdominal or pelvic lymph nodes. Reproductive: Prostate is unremarkable. Other: No abdominal wall hernia or abnormality. No abdominopelvic ascites. Musculoskeletal: There are degenerative changes of the spine and hips. IMPRESSION: 1. Percutaneous gastrostomy tube has been pulled out of the stomach in the interval. The balloon tip is in the intramuscular wall  of the anterior abdomen. There is some mild surrounding subcutaneous stranding surrounding the catheter. 2. No other acute localizing process in the abdomen or pelvis. Electronically Signed   By: Greig Pique M.D.   On: 03/31/2024 01:08   CT HEAD WO CONTRAST ( ) Result Date: 03/26/2024 CLINICAL DATA:  Blurred vision. EXAM: CT HEAD WITHOUT CONTRAST TECHNIQUE: Contiguous axial images were obtained from the base of the skull through the vertex without intravenous contrast. RADIATION DOSE REDUCTION: This exam was performed according to the departmental dose-optimization program which includes automated exposure control, adjustment of the mA and/or kV according to patient size and/or use of iterative reconstruction technique. COMPARISON:  January 03, 2024 FINDINGS: Brain: No evidence of acute infarction, hemorrhage, hydrocephalus, extra-axial collection or mass lesion/mass effect. There are very mild areas of decreased attenuation within the white matter tracts of the supratentorial brain, consistent with microvascular disease changes. Vascular: No hyperdense vessel or unexpected calcification. Skull: Normal. Negative for fracture or focal lesion. Sinuses/Orbits: No acute finding. Other: None. IMPRESSION: No acute intracranial abnormality. Electronically Signed   By: Suzen Dials M.D.   On: 03/26/2024 23:57   DG Abd 1 View Result Date: 03/20/2024 CLINICAL DATA:  Constipation EXAM: ABDOMEN - 1 VIEW COMPARISON:  CT 03/18/2024 FINDINGS: Bowel gas pattern is than the range of normal. There is not a grossly abnormal amount of fecal matter in the colon. Gastrostomy tube in place. No abnormal calcifications or acute bone findings. Right upper quadrant clips consistent with previous cholecystectomy. IMPRESSION: No acute finding. Gastrostomy tube in place. No evidence of increased stool volume. Electronically Signed   By: Oneil Officer M.D.   On: 03/20/2024 10:08   CT ABDOMEN PELVIS W CONTRAST Result Date: 03/18/2024 CLINICAL DATA:  Acute abdominal pain EXAM: CT ABDOMEN AND PELVIS WITH CONTRAST TECHNIQUE: Multidetector CT imaging of the abdomen and pelvis was performed using the standard protocol following bolus administration of intravenous contrast. RADIATION DOSE REDUCTION: This exam was performed according to the departmental dose-optimization program which includes automated exposure control, adjustment of the mA and/or kV according to patient size and/or use of iterative reconstruction technique. CONTRAST:  OMNIPAQUE  IOHEXOL  300 MG/ML  SOLN COMPARISON:  CT abdomen and pelvis 04/29/2017. FINDINGS: Lower chest: No acute abnormality. Hepatobiliary: No focal liver abnormality is seen. Status post cholecystectomy. No biliary dilatation. Pancreas: Unremarkable. No pancreatic ductal dilatation or surrounding inflammatory changes. Spleen: Normal in size without focal abnormality. Adrenals/Urinary Tract: Adrenal glands are unremarkable. Kidneys are normal, without renal calculi, focal lesion, or hydronephrosis. Bladder is distended. Stomach/Bowel: Percutaneous gastrostomy tube is in place in the body of the stomach. There is no bowel obstruction, pneumatosis, free air or focal inflammation identified. The appendix is within normal limits. There is a large amount of stool distending  the rectum. Vascular/Lymphatic: No significant vascular findings are present. No enlarged abdominal or pelvic lymph nodes. Reproductive: Prostate is unremarkable. Other: There is no ascites. There is subcutaneous fat stranding surrounding the PEG tube without focal fluid collection or air. No abdominal wall hernia. There is mild presacral edema. Musculoskeletal: No fracture is seen. Degenerative changes affect the spine and hips. IMPRESSION: 1. Percutaneous gastrostomy tube in place. There is subcutaneous fat stranding surrounding the PEG tube without focal fluid collection or air. Correlate clinically for cellulitis. 2. Large amount of stool distending the rectum. 3. Distended bladder. 4. Mild presacral edema. Electronically Signed   By: Greig Pique M.D.   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 Difficulty with oral intake during radiation treatment and need for percutaneous gastrostomy tube for nutritional needs. EXAM: PERCUTANEOUS GASTROSTOMY TUBE PLACEMENT ANESTHESIA/SEDATION: Moderate (conscious) sedation was employed during this procedure. A total of Versed  4.0 mg and Fentanyl  200 mcg was administered intravenously. Moderate Sedation Time: 13 minutes. The patient's level of consciousness and vital signs were monitored continuously by radiology nursing throughout the procedure under my direct supervision. CONTRAST:  20mL OMNIPAQUE  IOHEXOL  300 MG/ML  SOLN MEDICATIONS: 2 g IV Ancef . IV antibiotic was administered in an appropriate time interval prior to needle puncture of the skin. FLUOROSCOPY: Radiation Exposure Index: 16 mGy Kerma. PROCEDURE: The procedure, risks, benefits, and alternatives were explained to the patient. Questions regarding the procedure were encouraged and answered. The patient understands and consents to the procedure. A timeout was performed prior to  initiating the procedure. A 5-French catheter was then advanced through the patient's mouth under fluoroscopy into the esophagus and to the level of the stomach. This catheter was used to insufflate the stomach with air under fluoroscopy. The abdominal wall was prepped with chlorhexidine  in a sterile fashion, and a sterile drape was applied covering the operative field. A sterile gown and sterile gloves were used for the procedure. Local anesthesia was provided with 1% Lidocaine . A single T tack was deployed in the gastric lumen after needle puncture and contrast injection. The T tack was brought up to tension at the skin surface. A skin incision was made in the upper abdominal wall adjacent to the T tack insertion. Under fluoroscopy, an 18 gauge trocar needle was advanced into the stomach. Contrast injection was performed to confirm intraluminal position of the needle tip. Over a guidewire, a 9-French sheath was advanced into the lumen of the stomach. The wire was left in place as a safety wire. A loop snare device from a percutaneous gastrostomy kit was then advanced into the stomach. A floppy guide wire was advanced through the orogastric catheter under fluoroscopy in the stomach. The loop snare advanced through the percutaneous gastric access was used to snare the guide wire. This allowed withdrawal of the loop snare out of the patient's mouth by retraction of the orogastric catheter and wire. A 20-French bumper retention gastrostomy tube was looped around the snare device. It was then pulled back through the patient's mouth. The retention bumper was brought up to the anterior gastric wall. The T tack suture was cut at the skin. The exiting gastrostomy tube was cut to appropriate length and a feeding adapter applied. The catheter was injected with contrast material to confirm position and a fluoroscopic spot image saved. The tube was then flushed with saline. A dressing was applied over the gastrostomy exit  site. COMPLICATIONS: None. FINDINGS: The stomach distended well with air allowing safe placement of the gastrostomy tube. After placement, the tip of the gastrostomy tube lies in the body of the stomach. IMPRESSION: Percutaneous gastrostomy with placement of a 20-French bumper retention tube in the body of the stomach. This tube can be used for percutaneous feeds beginning in 24 hours after placement. Electronically Signed   By: Marcey Moan M.D.   On: 03/13/2024 14:59    Labs:  CBC: Recent Labs    03/20/24 0843 03/26/24 2109 03/30/24 2314 04/03/24 0915  WBC 5.9 5.1 5.1 5.4  HGB 12.3* 13.0 12.5* 14.3  HCT 39.0 39.2 38.6* 44.0  PLT 230 276 240 241  COAGS: Recent Labs    03/13/24 0954 04/03/24 0915  INR 1.0 1.0    BMP: Recent Labs    03/19/24 0101 03/20/24 0843 03/26/24 2109 03/30/24 2314  NA 136 135 135 139  K 3.9 4.2 4.5 4.7  CL 104 102 98 102  CO2 24 25 25 27   GLUCOSE 104* 100* 99 102*  BUN 10 11 10 14   CALCIUM 8.7* 8.9 9.6 9.5  CREATININE 0.68 0.89 0.98 1.02  GFRNONAA >60 >60 >60 >60    LIVER FUNCTION TESTS: Recent Labs    01/03/24 1623 03/18/24 0941 03/26/24 2109 03/30/24 2314  BILITOT 0.3 0.6 0.5 0.3  AST 21 28 18 17   ALT 35 43 29 27  ALKPHOS 90 71 73 68  PROT 7.3 7.2 7.2 6.9  ALBUMIN 4.3 3.6 4.3 4.0    TUMOR MARKERS: No results for input(s): AFPTM, CEA, CA199, CHROMGRNA in the last 8760 hours.  Assessment and Plan:  Douglas Hester is a 63 y.o. male with pmhx of chronic GERD, HTN presents after recent diagnosis of Squamous cell carcinoma of left tonsil who is referred to IR for percutaneous gastrostomy tube placement. Pt had initial gtube placement on 03/13/24 with IR, but this became dislodged and painful, and removed entirely yesterday, 9/3. Now presenting back for replacement.   Has been NPO. Has some concerns over his gagging he experienced from last placement and reassurance provided all measures to improve this would be taken.  All other questions answered.  Risks and benefits discussed with the patient including, but not limited to the need for a barium enema during the procedure, bleeding, infection, peritonitis, or damage to adjacent structures. All of the patient's questions were answered, patient is agreeable to proceed. Consent signed and in chart.   Thank you for allowing our service to participate in Douglas Hester 's care.  Electronically Signed: Kimble VEAR Clas, PA-C   04/03/2024, 10:25 AM      I spent a total of    15 Minutes in face to face in clinical consultation, greater than 50% of which was counseling/coordinating care for gastrostomy tube placement

## 2024-04-03 NOTE — Progress Notes (Signed)
 1349- Patient's GTube has been hooked up to low wall suction.

## 2024-04-04 ENCOUNTER — Inpatient Hospital Stay: Admitting: Dietician

## 2024-04-04 ENCOUNTER — Other Ambulatory Visit: Payer: Self-pay

## 2024-04-04 ENCOUNTER — Ambulatory Visit

## 2024-04-04 ENCOUNTER — Ambulatory Visit: Admission: RE | Admit: 2024-04-04 | Source: Ambulatory Visit

## 2024-04-04 DIAGNOSIS — C099 Malignant neoplasm of tonsil, unspecified: Secondary | ICD-10-CM

## 2024-04-04 NOTE — Progress Notes (Signed)
 Oncology Nurse Navigator Documentation   I received a call from Mr. Hubbard's friend this am cancelling his nutrition and radiation appointment today due to not feeling well after his PEG reinsertion yesterday. I explained to his friend how important it is for him to come today for treatment and nutrition but he told me that he had already tried to convince him to come but Mr. Mccorvey declined.   [name] RN, BSN, OCN Head & Neck Oncology Nurse Navigator [facility] Phone # [phone]  Fax # [phone]

## 2024-04-07 ENCOUNTER — Ambulatory Visit
Admission: RE | Admit: 2024-04-07 | Discharge: 2024-04-07 | Disposition: A | Discharge status: Home / Self Care | Source: Ambulatory Visit | Attending: Radiation Oncology

## 2024-04-07 ENCOUNTER — Other Ambulatory Visit: Payer: Self-pay

## 2024-04-07 ENCOUNTER — Other Ambulatory Visit: Payer: Self-pay | Admitting: Radiation Oncology

## 2024-04-07 ENCOUNTER — Inpatient Hospital Stay: Attending: Radiation Oncology

## 2024-04-07 ENCOUNTER — Ambulatory Visit
Admission: RE | Admit: 2024-04-07 | Discharge: 2024-04-07 | Disposition: A | Discharge status: Home / Self Care | Source: Ambulatory Visit | Attending: Radiation Oncology | Admitting: Radiation Oncology

## 2024-04-07 ENCOUNTER — Ambulatory Visit

## 2024-04-07 VITALS — BP 136/82 | HR 92 | Temp 98.2°F | Resp 16

## 2024-04-07 DIAGNOSIS — C4442 Squamous cell carcinoma of skin of scalp and neck: Secondary | ICD-10-CM | POA: Insufficient documentation

## 2024-04-07 DIAGNOSIS — Z510 Encounter for antineoplastic radiation therapy: Secondary | ICD-10-CM | POA: Insufficient documentation

## 2024-04-07 DIAGNOSIS — C770 Secondary and unspecified malignant neoplasm of lymph nodes of head, face and neck: Secondary | ICD-10-CM | POA: Insufficient documentation

## 2024-04-07 DIAGNOSIS — R633 Feeding difficulties, unspecified: Secondary | ICD-10-CM | POA: Insufficient documentation

## 2024-04-07 DIAGNOSIS — C099 Malignant neoplasm of tonsil, unspecified: Secondary | ICD-10-CM

## 2024-04-07 LAB — RAD ONC ARIA SESSION SUMMARY
Course Elapsed Days: 47
Plan Fractions Treated to Date: 29
Plan Prescribed Dose Per Fraction: 2 Gy
Plan Total Fractions Prescribed: 35
Plan Total Prescribed Dose: 70 Gy
Reference Point Dosage Given to Date: 58 Gy
Reference Point Session Dosage Given: 2 Gy
Session Number: 29

## 2024-04-07 LAB — GLUCOSE, CAPILLARY: Glucose-Capillary: 124 mg/dL — ABNORMAL HIGH (ref 70–99)

## 2024-04-07 MED ORDER — OXYCODONE HCL 5 MG PO TABS
ORAL_TABLET | ORAL | Status: AC
  Filled 2024-04-07: qty 2

## 2024-04-07 MED ORDER — ONDANSETRON HCL 4 MG/2ML IJ SOLN
8.0000 mg | Freq: Once | INTRAMUSCULAR | Status: AC
  Administered 2024-04-07: 8 mg via INTRAVENOUS
  Filled 2024-04-07: qty 4

## 2024-04-07 MED ORDER — OXYCODONE HCL 5 MG PO TABS
10.0000 mg | ORAL_TABLET | Freq: Once | ORAL | Status: AC
  Administered 2024-04-07: 10 mg via ORAL

## 2024-04-07 MED ORDER — SODIUM CHLORIDE 0.9 % IV SOLN: Freq: Once | INTRAVENOUS | Status: AC

## 2024-04-07 MED ORDER — OXYCODONE HCL 5 MG/5ML PO SOLN: 5.0000 mg | ORAL | 0 refills | Status: DC | PRN

## 2024-04-07 NOTE — Patient Instructions (Signed)
Nausea, Adult Nausea is feeling like you may vomit. Feeling like you may vomit is usually not serious, but it may be an early sign of a more serious medical problem. Vomiting is when stomach contents forcefully come out of your mouth. If you vomit, or if you are not able to drink enough fluids, you may not have enough water in your body (get dehydrated). If you do not have enough water in your body, you may: Feel tired. Feel thirsty. Have a dry mouth. Have cracked lips. Pee (urinate) less often. Older adults and people who have other diseases or a weak body defense system (immune system) have a higher risk of not having enough water in the body. The main goals of treating this condition are: To relieve your nausea. To ensure your nausea occurs less often. To prevent vomiting and losing too much fluid. Follow these instructions at home: Watch your symptoms for any changes. Tell your doctor about them. Eating and drinking     Take an ORS (oral rehydration solution). This is a drink that is sold at pharmacies and stores. Drink clear fluids in small amounts as you are able. These include: Water. Ice chips. Fruit juice that has water added (diluted fruit juice). Low-calorie sports drinks. Eat bland, easy-to-digest foods in small amounts as you are able, such as: Bananas. Applesauce. Rice. Low-fat (lean) meats. Toast. Crackers. Avoid drinking fluids that have a lot of sugar or caffeine in them. This includes energy drinks, sports drinks, and soda. Avoid alcohol. Avoid spicy or fatty foods. General instructions Take over-the-counter and prescription medicines only as told by your doctor. Rest at home while you get better. Drink enough fluid to keep your pee (urine) pale yellow. Take slow and deep breaths when you feel like you may vomit. Avoid food or things that have strong smells. Wash your hands often with soap and water for at least 20 seconds. If you cannot use soap and water,  use hand sanitizer. Make sure that everyone in your home washes their hands well and often. Keep all follow-up visits. Contact a doctor if: You feel worse. You feel like you may vomit and this lasts for more than 2 days. You vomit. You are not able to drink fluids without vomiting. You have new symptoms. You have a fever. You have a headache. You have muscle cramps. You have a rash. You have pain while peeing. You feel light-headed or dizzy. Get help right away if: You have pain in your chest, neck, arm, or jaw. You feel very weak or you faint. You have vomit that is bright red or looks like coffee grounds. You have bloody or black poop (stools) or poop that looks like tar. You have a very bad headache, a stiff neck, or both. You have very bad pain, cramping, or bloating in your belly (abdomen). You have trouble breathing or you are breathing very quickly. Your heart is beating very quickly. Your skin feels cold and clammy. You feel confused. You have signs of losing too much water in your body, such as: Dark pee, very little pee, or no pee. Cracked lips. Dry mouth. Sunken eyes. Sleepiness. Weakness. These symptoms may be an emergency. Get help right away. Call 911. Do not wait to see if the symptoms will go away. Do not drive yourself to the hospital. Summary Nausea is feeling like you are about vomit. If you vomit, or if you are not able to drink enough fluids, you may not have enough water in   your body (get dehydrated). Eat and drink what your doctor tells you. Take over-the-counter and prescription medicines only as told by your doctor. Contact a doctor right away if your symptoms get worse or you have new symptoms. Keep all follow-up visits. This information is not intended to replace advice given to you by your health care provider. Make sure you discuss any questions you have with your health care provider. Document Revised: 01/21/2021 Document Reviewed:  01/21/2021 Elsevier Patient Education  2024 [employer] Inc[employer]  

## 2024-04-07 NOTE — Progress Notes (Signed)
 Per Dr. Izell OK to infuse IVFs at 700 mL/hr.

## 2024-04-08 ENCOUNTER — Other Ambulatory Visit: Payer: Self-pay

## 2024-04-08 ENCOUNTER — Ambulatory Visit
Admission: RE | Admit: 2024-04-08 | Discharge: 2024-04-08 | Disposition: A | Discharge status: Home / Self Care | Source: Ambulatory Visit | Attending: Radiation Oncology

## 2024-04-08 DIAGNOSIS — Z510 Encounter for antineoplastic radiation therapy: Secondary | ICD-10-CM | POA: Diagnosis not present

## 2024-04-08 LAB — RAD ONC ARIA SESSION SUMMARY
Course Elapsed Days: 48
Plan Fractions Treated to Date: 30
Plan Prescribed Dose Per Fraction: 2 Gy
Plan Total Fractions Prescribed: 35
Plan Total Prescribed Dose: 70 Gy
Reference Point Dosage Given to Date: 60 Gy
Reference Point Session Dosage Given: 2 Gy
Session Number: 30

## 2024-04-09 ENCOUNTER — Inpatient Hospital Stay: Admitting: Dietician

## 2024-04-09 ENCOUNTER — Other Ambulatory Visit: Payer: Self-pay

## 2024-04-09 ENCOUNTER — Ambulatory Visit
Admission: RE | Admit: 2024-04-09 | Discharge: 2024-04-09 | Disposition: A | Discharge status: Home / Self Care | Source: Ambulatory Visit | Attending: Radiation Oncology

## 2024-04-09 ENCOUNTER — Ambulatory Visit

## 2024-04-09 DIAGNOSIS — Z510 Encounter for antineoplastic radiation therapy: Secondary | ICD-10-CM | POA: Diagnosis not present

## 2024-04-09 LAB — RAD ONC ARIA SESSION SUMMARY
Course Elapsed Days: 49
Plan Fractions Treated to Date: 31
Plan Prescribed Dose Per Fraction: 2 Gy
Plan Total Fractions Prescribed: 35
Plan Total Prescribed Dose: 70 Gy
Reference Point Dosage Given to Date: 62 Gy
Reference Point Session Dosage Given: 2 Gy
Session Number: 31

## 2024-04-09 NOTE — Progress Notes (Deleted)
 Nutrition Follow-up:  Pt with SCC of oropharynx, p16+. He is receiving radiation therapy under the care of Dr. Izell. First RT 7/23 - Final RT planned 9/16   S/p PEG 8/14 and new tube placed 9/4   8/19-8/21 admission with abdominal wall cellulitis     Medications: reviewed   Labs: 8/31 reviewed - 9/8 refeeding labs not drawn   Anthropometrics: Last wt 200.2 lb redia) on 9/8 - stable x 7 days  9/2 - 200.2 lb  8/25 - 203.4 lb 8/18 - 207.4 lb 8/11 - 208.4 lb   Estimated Energy Needs  Kcals: 7349-7064 Protein: 123-142 Fluid: >2.6 L  NUTRITION DIAGNOSIS: Inadequate oral intake - addressing with TF      INTERVENTION: ***    MONITORING, EVALUATION, GOAL: wt trends, intake   NEXT VISIT: ***

## 2024-04-10 ENCOUNTER — Other Ambulatory Visit: Payer: Self-pay

## 2024-04-10 ENCOUNTER — Ambulatory Visit

## 2024-04-10 ENCOUNTER — Ambulatory Visit
Admission: RE | Admit: 2024-04-10 | Discharge: 2024-04-10 | Disposition: A | Discharge status: Home / Self Care | Source: Ambulatory Visit | Attending: Radiation Oncology

## 2024-04-10 ENCOUNTER — Ambulatory Visit
Admission: RE | Admit: 2024-04-10 | Discharge: 2024-04-10 | Disposition: A | Discharge status: Home / Self Care | Source: Ambulatory Visit | Attending: Radiation Oncology | Admitting: Radiation Oncology

## 2024-04-10 DIAGNOSIS — C099 Malignant neoplasm of tonsil, unspecified: Secondary | ICD-10-CM

## 2024-04-10 DIAGNOSIS — Z510 Encounter for antineoplastic radiation therapy: Secondary | ICD-10-CM | POA: Diagnosis not present

## 2024-04-10 LAB — CBC WITH DIFFERENTIAL (CANCER CENTER ONLY)
Abs Immature Granulocytes: 0.02 K/uL (ref 0.00–0.07)
Basophils Absolute: 0 K/uL (ref 0.0–0.1)
Basophils Relative: 0 %
Eosinophils Absolute: 0.1 K/uL (ref 0.0–0.5)
Eosinophils Relative: 2 %
HCT: 41.8 % (ref 39.0–52.0)
Hemoglobin: 13.8 g/dL (ref 13.0–17.0)
Immature Granulocytes: 0 %
Lymphocytes Relative: 7 %
Lymphs Abs: 0.4 K/uL — ABNORMAL LOW (ref 0.7–4.0)
MCH: 29.2 pg (ref 26.0–34.0)
MCHC: 33 g/dL (ref 30.0–36.0)
MCV: 88.4 fL (ref 80.0–100.0)
Monocytes Absolute: 0.7 K/uL (ref 0.1–1.0)
Monocytes Relative: 14 %
Neutro Abs: 4.1 K/uL (ref 1.7–7.7)
Neutrophils Relative %: 77 %
Platelet Count: 211 K/uL (ref 150–400)
RBC: 4.73 MIL/uL (ref 4.22–5.81)
RDW: 12.8 % (ref 11.5–15.5)
WBC Count: 5.3 K/uL (ref 4.0–10.5)
nRBC: 0 % (ref 0.0–0.2)

## 2024-04-10 LAB — RAD ONC ARIA SESSION SUMMARY
Course Elapsed Days: 50
Plan Fractions Treated to Date: 32
Plan Prescribed Dose Per Fraction: 2 Gy
Plan Total Fractions Prescribed: 35
Plan Total Prescribed Dose: 70 Gy
Reference Point Dosage Given to Date: 64 Gy
Reference Point Session Dosage Given: 2 Gy
Session Number: 32

## 2024-04-10 LAB — CMP (CANCER CENTER ONLY)
ALT: 34 U/L (ref 0–44)
AST: 24 U/L (ref 15–41)
Albumin: 4.3 g/dL (ref 3.5–5.0)
Alkaline Phosphatase: 78 U/L (ref 38–126)
Anion gap: 3 — ABNORMAL LOW (ref 5–15)
BUN: 11 mg/dL (ref 8–23)
CO2: 33 mmol/L — ABNORMAL HIGH (ref 22–32)
Calcium: 9.6 mg/dL (ref 8.9–10.3)
Chloride: 102 mmol/L (ref 98–111)
Creatinine: 0.99 mg/dL (ref 0.61–1.24)
GFR, Estimated: >60 mL/min (ref >60)
Glucose, Bld: 108 mg/dL — ABNORMAL HIGH (ref 70–99)
Potassium: 4.6 mmol/L (ref 3.5–5.1)
Sodium: 138 mmol/L (ref 135–145)
Total Bilirubin: 0.5 mg/dL (ref 0.0–1.2)
Total Protein: 7.3 g/dL (ref 6.5–8.1)

## 2024-04-10 MED ORDER — SODIUM CHLORIDE 0.9 % IV SOLN
INTRAVENOUS | Status: DC
  Filled 2024-04-10 (×2): qty 150

## 2024-04-11 ENCOUNTER — Ambulatory Visit

## 2024-04-11 ENCOUNTER — Other Ambulatory Visit: Payer: Self-pay

## 2024-04-11 ENCOUNTER — Ambulatory Visit
Admission: RE | Admit: 2024-04-11 | Discharge: 2024-04-11 | Disposition: A | Discharge status: Home / Self Care | Source: Ambulatory Visit | Attending: Radiation Oncology | Admitting: Radiation Oncology

## 2024-04-11 DIAGNOSIS — Z510 Encounter for antineoplastic radiation therapy: Secondary | ICD-10-CM | POA: Diagnosis not present

## 2024-04-11 LAB — RAD ONC ARIA SESSION SUMMARY
Course Elapsed Days: 51
Plan Fractions Treated to Date: 33
Plan Prescribed Dose Per Fraction: 2 Gy
Plan Total Fractions Prescribed: 35
Plan Total Prescribed Dose: 70 Gy
Reference Point Dosage Given to Date: 66 Gy
Reference Point Session Dosage Given: 2 Gy
Session Number: 33

## 2024-04-13 ENCOUNTER — Other Ambulatory Visit (HOSPITAL_COMMUNITY): Payer: Self-pay | Admitting: Psychiatry

## 2024-04-13 DIAGNOSIS — F3130 Bipolar disorder, current episode depressed, mild or moderate severity, unspecified: Secondary | ICD-10-CM

## 2024-04-14 ENCOUNTER — Ambulatory Visit

## 2024-04-14 ENCOUNTER — Telehealth (HOSPITAL_BASED_OUTPATIENT_CLINIC_OR_DEPARTMENT_OTHER): Payer: Self-pay | Admitting: Psychiatry

## 2024-04-14 ENCOUNTER — Other Ambulatory Visit: Payer: Self-pay

## 2024-04-14 ENCOUNTER — Ambulatory Visit
Admission: RE | Admit: 2024-04-14 | Discharge: 2024-04-14 | Disposition: A | Discharge status: Home / Self Care | Source: Ambulatory Visit | Attending: Radiation Oncology | Admitting: Radiation Oncology

## 2024-04-14 ENCOUNTER — Encounter (HOSPITAL_COMMUNITY): Payer: Self-pay

## 2024-04-14 DIAGNOSIS — Z510 Encounter for antineoplastic radiation therapy: Secondary | ICD-10-CM | POA: Diagnosis not present

## 2024-04-14 DIAGNOSIS — Z91199 Patient's noncompliance with other medical treatment and regimen due to unspecified reason: Secondary | ICD-10-CM

## 2024-04-14 LAB — RAD ONC ARIA SESSION SUMMARY
Course Elapsed Days: 54
Plan Fractions Treated to Date: 34
Plan Prescribed Dose Per Fraction: 2 Gy
Plan Total Fractions Prescribed: 35
Plan Total Prescribed Dose: 70 Gy
Reference Point Dosage Given to Date: 68 Gy
Reference Point Session Dosage Given: 2 Gy
Session Number: 34

## 2024-04-14 NOTE — Progress Notes (Signed)
 Patient is no show today.

## 2024-04-14 NOTE — Progress Notes (Signed)
 Nutrition Follow-up:  Patient with SCC of oropharynx, p 16+.  He is receiving radiation therapy under the care of Dr Izell.    G-tube replaced on 9/4.    Met with patient and brother in law today after radiation.  Patient starting using [employer] 1.5 Peptide on 9/5, 4 cartons per day.  He has been tolerating this amount since then.  Says that he is tolerating this formula much better than other formula.  Reports giving about 1/2 syringe of water before, reglan  then more water then formula and water after.  Estimates about 1 full syringe of water in total at each feeding (60ml).  Also taking zofran  and pepcid .  Had a bowel movement yesterday but feels like he needs to start miralax .  Says that he is drinking 60 oz of water a day by mouth.  Not taking anything else by mouth.      Medications: zofran , reglan , pepcid   Labs: K normal from 9/11, no magnesium or phosphorus drawn  Anthropometrics:   Weight 195 lb per [facility] today 195 lb on 8/31 203 lb on 8/25 (aria) 207.4 lb on 8/18 208.4 lb on 8/11   Estimated Energy Needs  Kcals: 7349-7064 Protein: 123-142 g Fluid: > 2.6 L  NUTRITION DIAGNOSIS: Inadequate oral intake  - addressing with tube feeding   INTERVENTION:  Continue [employer] 1.5 peptide, 4 cartons per day at this time.  Flush with 30ml before and after each feeding.   Continue drinking at least 60 oz of water daily by mouth.  (1844ml/day) Current tube feeding regimen provides 2000 calories, 96 g protein and 1152 ml free water (flush and water from formula).  Recommend checking CMP, magnesium and phosphorus tomorrow (9/16) to assess for refeeding.  Message sent to Dr Izell and team.  Will assess need to increase formula pending patient tolerance and lab results.   [employer] 1.5 peptide samples given to patient today  MONITORING, EVALUATION, GOAL: weight trends, tube feeding   NEXT VISIT: to be determined  Loxley Schmale B. Dasie SOLON, CSO, LDN Registered Dietitian [phone]

## 2024-04-15 ENCOUNTER — Other Ambulatory Visit: Payer: Self-pay

## 2024-04-15 ENCOUNTER — Telehealth: Payer: Self-pay

## 2024-04-15 ENCOUNTER — Ambulatory Visit
Admission: RE | Admit: 2024-04-15 | Discharge: 2024-04-15 | Disposition: A | Discharge status: Home / Self Care | Source: Ambulatory Visit | Attending: Radiation Oncology

## 2024-04-15 DIAGNOSIS — Z510 Encounter for antineoplastic radiation therapy: Secondary | ICD-10-CM | POA: Diagnosis not present

## 2024-04-15 DIAGNOSIS — C099 Malignant neoplasm of tonsil, unspecified: Secondary | ICD-10-CM

## 2024-04-15 DIAGNOSIS — F419 Anxiety disorder, unspecified: Secondary | ICD-10-CM

## 2024-04-15 LAB — RAD ONC ARIA SESSION SUMMARY
Course Elapsed Days: 55
Plan Fractions Treated to Date: 35
Plan Prescribed Dose Per Fraction: 2 Gy
Plan Total Fractions Prescribed: 35
Plan Total Prescribed Dose: 70 Gy
Reference Point Dosage Given to Date: 70 Gy
Reference Point Session Dosage Given: 2 Gy
Session Number: 35

## 2024-04-15 NOTE — Progress Notes (Signed)
 Oncology Nurse Navigator Documentation   Met with Mr. Vantol, his sister, and brother in law after final RT to offer support and to celebrate end of radiation treatment.   Provided verbal post-RT guidance: Importance of keeping all follow-up appts, especially those with Nutrition and SLP. Importance of protecting treatment area from sun. Continuation of Sonafine application 2-3 times daily, application of antibiotic ointment to areas of raw skin; when supply of Sonafine exhausted transition to OTC lotion with vitamin E.  Explained my role as navigator will continue for several more months, encouraged him to call me with needs/concerns.    [name] RN, BSN, OCN Head & Neck Oncology Nurse Navigator [facility] Phone # [phone]  Fax # [phone]

## 2024-04-15 NOTE — Telephone Encounter (Signed)
 Called Douglas Hester 3X to notify him to come to his lab appointment to get his CMP, Phosphorus and magnesium drawn. Patient did not respond to phone calls, Left two voicemails to remind him.   Left a voicemail for sister Douglas Hester

## 2024-04-16 ENCOUNTER — Ambulatory Visit
Admission: RE | Admit: 2024-04-16 | Discharge: 2024-04-16 | Disposition: A | Discharge status: Home / Self Care | Source: Ambulatory Visit | Attending: Radiation Oncology | Admitting: Radiation Oncology

## 2024-04-16 ENCOUNTER — Other Ambulatory Visit: Payer: Self-pay

## 2024-04-16 DIAGNOSIS — Z510 Encounter for antineoplastic radiation therapy: Secondary | ICD-10-CM | POA: Diagnosis not present

## 2024-04-16 DIAGNOSIS — C099 Malignant neoplasm of tonsil, unspecified: Secondary | ICD-10-CM

## 2024-04-16 LAB — CMP (CANCER CENTER ONLY)
ALT: 23 U/L (ref 0–44)
AST: 18 U/L (ref 15–41)
Albumin: 4.2 g/dL (ref 3.5–5.0)
Alkaline Phosphatase: 72 U/L (ref 38–126)
Anion gap: 4 — ABNORMAL LOW (ref 5–15)
BUN: 16 mg/dL (ref 8–23)
CO2: 30 mmol/L (ref 22–32)
Calcium: 9.4 mg/dL (ref 8.9–10.3)
Chloride: 105 mmol/L (ref 98–111)
Creatinine: 0.99 mg/dL (ref 0.61–1.24)
GFR, Estimated: >60 mL/min (ref >60)
Glucose, Bld: 140 mg/dL — ABNORMAL HIGH (ref 70–99)
Potassium: 4.3 mmol/L (ref 3.5–5.1)
Sodium: 139 mmol/L (ref 135–145)
Total Bilirubin: 0.6 mg/dL (ref 0.0–1.2)
Total Protein: 7.2 g/dL (ref 6.5–8.1)

## 2024-04-16 LAB — PHOSPHORUS: Phosphorus: 3.1 mg/dL (ref 2.5–4.6)

## 2024-04-16 LAB — MAGNESIUM: Magnesium: 2.3 mg/dL (ref 1.7–2.4)

## 2024-04-16 NOTE — Radiation Completion Notes (Signed)
 Patient Name: Douglas Hester, Douglas Hester MRN: [id number] Date of Birth: [birth year] Referring Physician: ELDORA BLANCH, M.D. Date of Service: 2024-04-16 Radiation Oncologist: Lauraine Golden, M.D. 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, M=cM0 Intent: Curative     ==========DELIVERED PLANS==========  First Treatment Date: 2024-02-20 Last Treatment Date: 2024-04-15   Plan Name: HN_L_[id number] Site: Tonsil, Left Technique: IMRT Mode: Photon Dose Per Fraction: 2 Gy Prescribed Dose (Delivered / Prescribed): 70 Gy / 70 Gy Prescribed Fxs (Delivered / Prescribed): 35 / 35     ==========ON TREATMENT VISIT DATES========== 2024-02-25, 2024-02-29, 2024-03-10, 2024-03-17, 2024-03-24, 2024-04-01, 2024-04-07, 2024-04-10, 2024-04-14     ==========UPCOMING VISITS========== 05/06/2024 [facility] FOLLOW UP [address] Leer[address] CHRISTELLA DEVONNA  04/30/2024 [facility] REH AT [id number] [place], [place] L, PT  04/29/2024 [facility] ST TREATMENT Jacelyn Lupita NOVAK, CCC-SLP  04/16/2024 [facility] LAB [facility]        ==========APPENDIX - ON TREATMENT VISIT NOTES==========   See weekly On Treatment Notes in Epic for details in the Media tab (listed as Progress notes on the On Treatment Visit Dates listed above).

## 2024-04-18 ENCOUNTER — Telehealth: Payer: Self-pay | Admitting: *Deleted

## 2024-04-18 NOTE — Telephone Encounter (Signed)
 CALLED PATIENT TO ASK IF HE WOULD BE WILLING TO SEE NUTRITIONIST ON 04-22-24 @ 3:15 PM, PATIENT AGREED TO DO SO

## 2024-04-21 ENCOUNTER — Telehealth (HOSPITAL_BASED_OUTPATIENT_CLINIC_OR_DEPARTMENT_OTHER): Admitting: Psychiatry

## 2024-04-21 ENCOUNTER — Encounter (HOSPITAL_COMMUNITY): Payer: Self-pay | Admitting: Psychiatry

## 2024-04-21 VITALS — Wt 195.0 lb

## 2024-04-21 DIAGNOSIS — F419 Anxiety disorder, unspecified: Secondary | ICD-10-CM | POA: Diagnosis not present

## 2024-04-21 DIAGNOSIS — F3130 Bipolar disorder, current episode depressed, mild or moderate severity, unspecified: Secondary | ICD-10-CM | POA: Diagnosis not present

## 2024-04-21 DIAGNOSIS — G4700 Insomnia, unspecified: Secondary | ICD-10-CM | POA: Diagnosis not present

## 2024-04-21 MED ORDER — LAMOTRIGINE 150 MG PO TABS: 150.0000 mg | ORAL_TABLET | Freq: Every day | ORAL | 2 refills | Status: DC

## 2024-04-21 MED ORDER — MIRTAZAPINE 15 MG PO TABS: 15.0000 mg | ORAL_TABLET | Freq: Every evening | ORAL | 0 refills | Status: DC

## 2024-04-21 MED ORDER — BUPROPION HCL ER (XL) 150 MG PO TB24: 150.0000 mg | ORAL_TABLET | Freq: Every day | ORAL | 0 refills | Status: DC

## 2024-04-21 NOTE — Progress Notes (Signed)
 [facility] MD Virtual Progress Note   [facility] Provider Location: Home office  I connect with patient by video and verified that I am speaking with correct person by using two identifiers. I discussed the limitations of evaluation and management by telemedicine and the availability of in person appointments. I also discussed with the patient that there may be a patient responsible charge related to this service. The patient expressed understanding and agreed to proceed.  Douglas Hester [id number] 63 y.o.  04/21/2024 10:21 AM  History of Present Illness:  Patient is evaluated by video session.  He was last seen in June.  Patient diagnosed with cancer of tonsil and recently had treatment at cancer center.  He does finish his last treatment.  He reported past few months were difficult and he had lost more than 25 pounds.  He had to stop working because of the treatment.  He reported anxiety and nervousness and his sister decided to take him home.  He also had a good support from church friends.  He reported having a feeding tube because he does not have a taste and not sure when the feeding tube will become out.  He admitted struggle with sleep and fatigue.  He is out of work but hoping able to go back to work before Christmas.  He also reported having restless leg and he used to see neurologist and given Requip and he schedule appointment coming up in few weeks.  Patient denies any mania, psychosis or any hallucination.  He denies any irritability or frustration but admitted ruminative thoughts when he think about his general health.  He was also given Ativan  but he did not like to have the anxiety.  He is only taking ibuprofen but in the past he was given some oxycodone  but make him very constipated and sedated.  He still feels very tired and sleeps too much in the daytime and then struggle with sleep at night.  He is taking mirtazapine , Wellbutrin  and Lamictal .  He  has no rash or any itching.  Since he has lost weight he is no longer taking blood pressure medicine as pressure is better.  He really had a good support from church friends and also his sister and brother-in-law.  Denies any hallucination, paranoia.  He denies any active or passive suicidal thoughts.  Patient like to go back to work.  He does not want to change the medication because he feel keeping him is stable.  Past Psychiatric History: Saw Dr. Geoffry in past and diagnosed with bipolar and depression. Given Lamictal  and Wellbutrin .  Rozerem  did not work. PCP tried Prozac, Paxil (sexual side effects) and trazodone  (did not work). Abilify  never picked up. No h/o suicidal attempt, psychosis and inpatient treatment.    Past Medical History:  Diagnosis Date   Anxiety    Chronic GERD    Heart murmur    Hypertension    Tachycardia     Outpatient Encounter Medications as of 04/21/2024  Medication Sig   acetaminophen  (TYLENOL ) 500 MG tablet Take 500-1,000 mg by mouth 2 (two) times daily as needed (pain.).   buPROPion  (WELLBUTRIN  XL) 150 MG 24 hr tablet Take 1 tablet (150 mg total) by mouth daily. Bridge to patient appt 12/16 (Patient taking differently: Take 150 mg by mouth every evening. Bridge to patient appt 12/16)   fluconazole  (DIFLUCAN ) 10 MG/ML suspension Take 20mL today, then 10mL daily for 20 days. Ok to take by PEG tube.   lamoTRIgine  (  LAMICTAL ) 150 MG tablet Take 1 tablet (150 mg total) by mouth daily. (Patient taking differently: Take 150 mg by mouth every evening.)   lidocaine  (XYLOCAINE ) 2 % solution Use as directed 15 mLs in the mouth or throat as needed for mouth pain. Mix 1 part 2%viscous lidocaine ,1part H2O.Swish and/or swallow 10ml of this mixture,43min before meals and at bedtime, up to QID   LORazepam  (ATIVAN ) 0.5 MG tablet Take 1 tablet (0.5 mg total) by mouth as needed for anxiety. Take 1 tablet 20 minutes prior to radiation treatment. Please have a driver given sedative  effects.   Melatonin 5 MG TABS Take 5 mg by mouth at bedtime as needed (sleep).   metoCLOPramide  (REGLAN ) 5 MG/5ML solution Take 10 mLs (10 mg total) by mouth 4 (four) times daily -  before meals and at bedtime. Ok to take by tube. If you have 4 meals a day, do not take at bedtime.   metoprolol  succinate (TOPROL -XL) 25 MG 24 hr tablet Take 25 mg by mouth every evening.   mirtazapine  (REMERON ) 15 MG tablet Take 1 tablet (15 mg total) by mouth as needed (for sleep).   Nutritional Supplements (NUTREN 1.5) LIQD 7 cartons Nutren 1.5/equivalent split over 4 feedings Flush tube with 60 ml water before and after each bolus Give additional 3.5 cups water (830 ml water) via PEG/po to meet hydration needs  Provides 2625 kcal, 119 g, 1337 ml free water (2647 ml total water). 1750 ml/day meets >95% est needs, 100% DRI   oxyCODONE  (ROXICODONE ) 5 MG/5ML solution Take 5-10 mLs (5-10 mg total) by mouth every 3 (three) hours as needed for severe pain (pain score 7-10). Take w/ food.   polyethylene glycol (MIRALAX  / GLYCOLAX ) 17 g packet Take 17 g by mouth 2 (two) times daily.   promethazine  (PHENERGAN ) 25 MG tablet Take 1 tablet (25 mg total) by mouth every 6 (six) hours as needed for nausea or vomiting.   No facility-administered encounter medications on file as of 04/21/2024.    Recent Results (from the past 2160 hours)  Basic metabolic panel per protocol     Status: Abnormal   Collection Time: 01/23/24 12:00 PM  Result Value Ref Range   Sodium 140 135 - 145 mmol/L   Potassium 4.4 3.5 - 5.1 mmol/L   Chloride 105 98 - 111 mmol/L   CO2 28 22 - 32 mmol/L   Glucose, Bld 95 70 - 99 mg/dL    Comment: Glucose reference range applies only to samples taken after fasting for at least 8 hours.   BUN 7 (L) 8 - 23 mg/dL   Creatinine, Ser 9.07 0.61 - 1.24 mg/dL   Calcium 9.1 8.9 - 89.6 mg/dL   GFR, Estimated >39 >39 mL/min    Comment: (NOTE) Calculated using the CKD-EPI Creatinine Equation (2021)    Anion gap 7 5  - 15    Comment: Performed at [facility], [address] [place], [place] [address]  CBC per protocol     Status: None   Collection Time: 01/23/24 12:00 PM  Result Value Ref Range   WBC 6.5 4.0 - 10.5 K/uL   RBC 4.99 4.22 - 5.81 MIL/uL   Hemoglobin 14.4 13.0 - 17.0 g/dL   HCT 55.3 60.9 - 47.9 %   MCV 89.4 80.0 - 100.0 fL   MCH 28.9 26.0 - 34.0 pg   MCHC 32.3 30.0 - 36.0 g/dL   RDW 87.7 88.4 - 84.4 %   Platelets 262 150 -  400 K/uL   nRBC 0.0 0.0 - 0.2 %    Comment: Performed at [facility], [address] [place], [place] [address]  Glucose, capillary     Status: None   Collection Time: 01/25/24  1:51 PM  Result Value Ref Range   Glucose-Capillary 87 70 - 99 mg/dL    Comment: Glucose reference range applies only to samples taken after fasting for at least 8 hours.  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 RIGHT LINGUAL TONSIL, TONSILLECTOMY: Focal surface and crypt high-grade dysplasia Negative for invasion Lymphoid follicular hyperplasia Actinomyces present  C. LEFT TONSILLAR FOSSA, BIOPSY: Benign tonsillar type squamous mucosa Negative for dysplasia and carcinoma  COMMENT:  An immunohistochemical stain for the HPV surrogate marker p16 is performed on the invasive tumor (A) and is diffusely and strongly positive supporting that this is an HPV related tumor.  Case is reviewed by Dr. Rebbecca who concurs with the interpretation.  Diagnosis conveyed to [name] by Dr. Reed via [employer]   on 01/30/2024.   GROSS DESCRIPTION:  A: The specimen is received fresh and consists of a 2.5 x 2.4 x 0.6 cm aggregate of tan-pink to red soft tissue.  The specimen is entirely submitted in 2 cassettes.  B: The specimen is received fresh and consists of a 1.6 x 1.5 x 0.4 cm aggregate of tan-pink to red soft tissue.  The specimen is entirely submitted in 1 cassette.  C: The specimen is received fresh and consists of a 0.8 x 0.6 x 0.2 cm piece of tan-pink soft tissue.  The specimen is entirely submitted in 1 cassette.  SHIRLEEN 01/29/2024)  Final Diagnosis performed by Prentice Reed, MD.   Electronically signed 01/31/2024 Technical and / or Professional components performed at [facility]. [facility], [address], [place], [place] [address].  Immunohistochemistry Technical component (if applicable) was performed at [facility]. [address], [address], [place], [place] [address].   IMMUNOHISTOCHEMISTRY DISCLAIMER (if applicable): Some of these  immunohistochemical stains may have been developed and the performance characteristics determine by [facility]. Some may not have been cleared or approved by the U.S. Food and Drug Administration. The FDA has determined that such clearance or approval is not necessary. This test is used for clinical purposes. It should not be regarded as investigational or for research. This laboratory is certified under the Clinical Laboratory Improvement Amendments of 1988 (CLIA-88) as qualified to perform high complexity clinical laboratory testing.  The controls stained appropriately.   IHC stains are performed on formalin fixed, paraffin embedded tissue using a 3,3diaminobenzidine (DAB) chromogen and Leica Bond Autostainer System. The staining intensity of the nucleus is score manually and is reported as the percentage of tumor cell nuclei demonstrating specific nuclear staining. The specimens are fixed in 10% Neutral  Formalin for at least 6 hours and up to 72hrs. The se tests are validated on decalcified tissue. Results should be interpreted with caution given the possibility of false negative results on decalcified specimens.  Antibody Clones are as follows ER-clone 47F, PR-clone 16, Ki67- clone MM1. Some of these immunohistochemical stains may have been developed and the performance characteristics determined by [facility].   TSH     Status: None   Collection Time: 02/06/24  1:54 PM  Result Value Ref Range   TSH 1.110 0.350 - 4.500 uIU/mL    Comment: Performed at [facility], [address], [place], [place] [address]  [facility] Session Summary     Status: None   Collection Time: 02/20/24  1:38 PM  Result Value Ref Range   Course ID [email or web address]    Course Start Date 02/13/2024    Session Number 1    Course First Treatment Date 02/20/2024  1:33 PM    Course Last Treatment Date 02/20/2024  1:35 PM    Course Elapsed Days 0    Reference Point ID HN_L_LingTon dp    Reference Point Dosage Given to Date 2 Gy   Reference Point Session Dosage Given 2 Gy   Plan ID HN_L_LingTon    Plan Name HN_L_LingTon    Plan Fractions Treated to Date 1    Plan Total Fractions Prescribed 35    Plan Prescribed Dose Per Fraction 2 Gy   Plan Total Prescribed Dose 70.000000 Gy   Plan Primary Reference Point HN_L_LingTon dp   Rad Onc Aria Session Summary     Status: None   Collection Time: 02/21/24  5:06 PM  Result Value Ref Range   Course ID [email or web address]    Course Start Date 02/13/2024    Session Number 2    Course First Treatment Date 02/20/2024  1:33 PM    Course Last Treatment Date 02/21/2024  5:04 PM    Course Elapsed Days 1    Reference Point ID HN_L_LingTon dp    Reference Point Dosage Given to Date 4 Gy   Reference Point Session Dosage Given 2 Gy   Plan ID HN_L_LingTon    Plan Name HN_L_LingTon    Plan Fractions Treated to Date 2    Plan Total Fractions Prescribed 35    Plan Prescribed Dose  Per Fraction 2 Gy   Plan Total Prescribed Dose 70.000000 Gy   Plan Primary Reference Point HN_L_LingTon dp   Rad Onc Aria Session Summary     Status: None   Collection Time: 02/22/24 12:12 PM  Result Value Ref Range   Course ID [email or web address]    Course Start Date 02/13/2024    Session Number 3    Course First Treatment Date 02/20/2024  1:33 PM    Course Last Treatment Date 02/22/2024 12:09 PM    Course Elapsed Days 2    Reference Point ID HN_L_LingTon dp    Reference Point Dosage Given to Date 6 Gy   Reference Point Session Dosage Given 2 Gy   Plan ID HN_L_LingTon    Plan Name HN_L_LingTon    Plan Fractions Treated to Date 3    Plan Total Fractions Prescribed 35    Plan Prescribed Dose Per Fraction 2 Gy   Plan Total Prescribed Dose 70.000000 Gy   Plan Primary Reference Point HN_L_LingTon dp   Rad Onc Aria Session Summary     Status: None   Collection Time: 02/25/24  3:21 PM  Result Value Ref Range   Course ID [email or web address]    Course Start Date 02/13/2024    Session Number 4    Course First Treatment Date 02/20/2024  1:33 PM    Course Last Treatment Date  02/25/2024  3:19 PM    Course Elapsed Days 5    Reference Point ID HN_L_LingTon dp    Reference Point Dosage Given to Date 8 Gy   Reference Point Session Dosage Given 2 Gy   Plan ID HN_L_LingTon    Plan Name HN_L_LingTon    Plan Fractions Treated to Date 4    Plan Total Fractions Prescribed 35    Plan Prescribed Dose Per Fraction 2 Gy   Plan Total Prescribed Dose 70.000000 Gy   Plan Primary Reference Point HN_L_LingTon dp   Rad Onc Aria Session Summary     Status: None   Collection Time: 02/26/24  3:00 PM  Result Value Ref Range   Course ID [email or web address]    Course Start Date 02/13/2024    Session Number 5    Course First Treatment Date 02/20/2024  1:33 PM    Course Last Treatment Date 02/26/2024  2:58 PM    Course Elapsed Days 6    Reference Point ID HN_L_LingTon dp    Reference Point Dosage Given to Date 10 Gy   Reference Point Session Dosage Given  2 Gy   Plan ID HN_L_LingTon    Plan Name HN_L_LingTon    Plan Fractions Treated to Date 5    Plan Total Fractions Prescribed 35    Plan Prescribed Dose Per Fraction 2 Gy   Plan Total Prescribed Dose 70.000000 Gy   Plan Primary Reference Point HN_L_LingTon dp   Rad Onc Aria Session Summary     Status: None   Collection Time: 02/27/24  2:38 PM  Result Value Ref Range   Course ID [email or web address]    Course Start Date 02/13/2024    Session Number 6    Course First Treatment Date 02/20/2024  1:33 PM    Course Last Treatment Date 02/27/2024  2:35 PM    Course Elapsed Days 7    Reference Point ID HN_L_LingTon dp    Reference Point Dosage Given to Date 12 Gy   Reference Point Session Dosage Given 2 Gy   Plan ID HN_L_LingTon    Plan Name HN_L_LingTon    Plan Fractions Treated to Date 6    Plan Total Fractions Prescribed 35    Plan Prescribed Dose Per Fraction 2 Gy   Plan Total Prescribed Dose 70.000000 Gy   Plan Primary Reference Point HN_L_LingTon dp   Rad Onc Aria Session Summary     Status: None   Collection Time: 02/28/24  1:05 PM  Result Value Ref Range   Course ID [email or web address]    Course Start Date 02/13/2024    Session Number 7    Course First Treatment Date 02/20/2024  1:33 PM    Course Last Treatment Date 02/28/2024  1:03 PM    Course Elapsed Days 8    Reference Point ID HN_L_LingTon dp    Reference Point Dosage Given to Date 14 Gy   Reference Point Session Dosage Given 2 Gy   Plan ID HN_L_LingTon    Plan Name HN_L_LingTon    Plan Fractions Treated to Date 7    Plan Total Fractions Prescribed 35    Plan Prescribed Dose Per Fraction 2 Gy   Plan Total Prescribed Dose 70.000000 Gy   Plan Primary Reference Point HN_L_LingTon dp   Rad Onc Aria Session Summary     Status: None   Collection Time: 02/29/24  3:16 PM  Result Value Ref Range   Course ID [email or web address]    Course Start Date 02/13/2024  Session Number 8    Course First Treatment Date 02/20/2024  1:33 PM    Course Last Treatment Date 02/29/2024   3:14 PM    Course Elapsed Days 9    Reference Point ID HN_L_LingTon dp    Reference Point Dosage Given to Date 16 Gy   Reference Point Session Dosage Given 2 Gy   Plan ID HN_L_LingTon    Plan Name HN_L_LingTon    Plan Fractions Treated to Date 8    Plan Total Fractions Prescribed 35    Plan Prescribed Dose Per Fraction 2 Gy   Plan Total Prescribed Dose 70.000000 Gy   Plan Primary Reference Point HN_L_LingTon dp   Rad Onc Aria Session Summary     Status: None   Collection Time: 03/03/24  2:34 PM  Result Value Ref Range   Course ID [email or web address]    Course Start Date 02/13/2024    Session Number 9    Course First Treatment Date 02/20/2024  1:33 PM    Course Last Treatment Date 03/03/2024  2:32 PM    Course Elapsed Days 12    Reference Point ID HN_L_LingTon dp    Reference Point Dosage Given to Date 18 Gy   Reference Point Session Dosage Given 2 Gy   Plan ID HN_L_LingTon    Plan Name HN_L_LingTon    Plan Fractions Treated to Date 9    Plan Total Fractions Prescribed 35    Plan Prescribed Dose Per Fraction 2 Gy   Plan Total Prescribed Dose 70.000000 Gy   Plan Primary Reference Point HN_L_LingTon dp   Rad Onc Aria Session Summary     Status: None   Collection Time: 03/04/24 11:34 AM  Result Value Ref Range   Course ID [email or web address]    Course Start Date 02/13/2024    Session Number 10    Course First Treatment Date 02/20/2024  1:33 PM    Course Last Treatment Date 03/04/2024 11:32 AM    Course Elapsed Days 13    Reference Point ID HN_L_LingTon dp    Reference Point Dosage Given to Date 20 Gy   Reference Point Session Dosage Given 2 Gy   Plan ID HN_L_LingTon    Plan Name HN_L_LingTon    Plan Fractions Treated to Date 10    Plan Total Fractions Prescribed 35    Plan Prescribed Dose Per Fraction 2 Gy   Plan Total Prescribed Dose 70.000000 Gy   Plan Primary Reference Point HN_L_LingTon dp   Rad Onc Aria Session Summary     Status: None   Collection Time: 03/05/24 11:16 AM  Result Value Ref Range    Course ID [email or web address]    Course Start Date 02/13/2024    Session Number 11    Course First Treatment Date 02/20/2024  1:33 PM    Course Last Treatment Date 03/05/2024 11:14 AM    Course Elapsed Days 14    Reference Point ID HN_L_LingTon dp    Reference Point Dosage Given to Date 22 Gy   Reference Point Session Dosage Given 2 Gy   Plan ID HN_L_LingTon    Plan Name HN_L_LingTon    Plan Fractions Treated to Date 11    Plan Total Fractions Prescribed 35    Plan Prescribed Dose Per Fraction 2 Gy   Plan Total Prescribed Dose 70.000000 Gy   Plan Primary Reference Point HN_L_LingTon dp   Rad Onc Aria Session Summary     Status: None   Collection Time: 03/06/24 11:57 AM  Result Value  Ref Range   Course ID [email or web address]    Course Start Date 02/13/2024    Session Number 12    Course First Treatment Date 02/20/2024  1:33 PM    Course Last Treatment Date 03/06/2024 11:55 AM    Course Elapsed Days 15    Reference Point ID HN_L_LingTon dp    Reference Point Dosage Given to Date 24 Gy   Reference Point Session Dosage Given 2 Gy   Plan ID HN_L_LingTon    Plan Name HN_L_LingTon    Plan Fractions Treated to Date 12    Plan Total Fractions Prescribed 35    Plan Prescribed Dose Per Fraction 2 Gy   Plan Total Prescribed Dose 70.000000 Gy   Plan Primary Reference Point HN_L_LingTon dp   Rad Onc Aria Session Summary     Status: None   Collection Time: 03/07/24 11:00 AM  Result Value Ref Range   Course ID [email or web address]    Course Start Date 02/13/2024    Session Number 13    Course First Treatment Date 02/20/2024  1:33 PM    Course Last Treatment Date 03/07/2024 10:58 AM    Course Elapsed Days 16    Reference Point ID HN_L_LingTon dp    Reference Point Dosage Given to Date 26 Gy   Reference Point Session Dosage Given 2 Gy   Plan ID HN_L_LingTon    Plan Name HN_L_LingTon    Plan Fractions Treated to Date 13    Plan Total Fractions Prescribed 35    Plan Prescribed Dose Per Fraction 2 Gy   Plan Total Prescribed Dose 70.000000  Gy   Plan Primary Reference Point HN_L_LingTon dp   Rad Onc Aria Session Summary     Status: None   Collection Time: 03/10/24  9:57 AM  Result Value Ref Range   Course ID [email or web address]    Course Start Date 02/13/2024    Session Number 14    Course First Treatment Date 02/20/2024  1:33 PM    Course Last Treatment Date 03/10/2024  9:55 AM    Course Elapsed Days 19    Reference Point ID HN_L_LingTon dp    Reference Point Dosage Given to Date 28 Gy   Reference Point Session Dosage Given 2 Gy   Plan ID HN_L_LingTon    Plan Name HN_L_LingTon    Plan Fractions Treated to Date 14    Plan Total Fractions Prescribed 35    Plan Prescribed Dose Per Fraction 2 Gy   Plan Total Prescribed Dose 70.000000 Gy   Plan Primary Reference Point HN_L_LingTon dp   Basic Metabolic Panel - Cancer Center Only     Status: None   Collection Time: 03/10/24 12:27 PM  Result Value Ref Range   Sodium 136 135 - 145 mmol/L   Potassium 4.6 3.5 - 5.1 mmol/L   Chloride 100 98 - 111 mmol/L   CO2 30 22 - 32 mmol/L   Glucose, Bld 83 70 - 99 mg/dL    Comment: Glucose reference range applies only to samples taken after fasting for at least 8 hours.   BUN 19 8 - 23 mg/dL   Creatinine 9.04 9.38 - 1.24 mg/dL   Calcium 9.6 8.9 - 89.6 mg/dL   GFR, Estimated >39 >39 mL/min    Comment: (NOTE) Calculated using the CKD-EPI Creatinine Equation (2021)    Anion gap 6 5 - 15    Comment: Performed at [facility], [address] [place], [place] [address]  [facility]  Session Summary     Status: None   Collection Time: 03/11/24 11:02 AM  Result Value Ref Range   Course ID [email or web address]    Course Start Date 02/13/2024    Session Number 15    Course First Treatment Date 02/20/2024  1:33 PM    Course Last Treatment Date 03/11/2024 11:00 AM    Course Elapsed Days 20    Reference Point ID HN_L_LingTon dp    Reference Point Dosage Given to Date 30 Gy   Reference Point Session Dosage Given 2 Gy   Plan ID HN_L_LingTon     Plan Name HN_L_LingTon    Plan Fractions Treated to Date 15    Plan Total Fractions Prescribed 35    Plan Prescribed Dose Per Fraction 2 Gy   Plan Total Prescribed Dose 70.000000 Gy   Plan Primary Reference Point HN_L_LingTon dp   Rad Onc Aria Session Summary     Status: None   Collection Time: 03/12/24 10:29 AM  Result Value Ref Range   Course ID [email or web address]    Course Start Date 02/13/2024    Session Number 16    Course First Treatment Date 02/20/2024  1:33 PM    Course Last Treatment Date 03/12/2024 10:26 AM    Course Elapsed Days 21    Reference Point ID HN_L_LingTon dp    Reference Point Dosage Given to Date 32 Gy   Reference Point Session Dosage Given 2 Gy   Plan ID HN_L_LingTon    Plan Name HN_L_LingTon    Plan Fractions Treated to Date 16    Plan Total Fractions Prescribed 35    Plan Prescribed Dose Per Fraction 2 Gy   Plan Total Prescribed Dose 70.000000 Gy   Plan Primary Reference Point HN_L_LingTon dp   CBC with Differential/Platelet     Status: Abnormal   Collection Time: 03/13/24  9:54 AM  Result Value Ref Range   WBC 5.2 4.0 - 10.5 K/uL   RBC 4.81 4.22 - 5.81 MIL/uL   Hemoglobin 14.0 13.0 - 17.0 g/dL   HCT 57.3 60.9 - 47.9 %   MCV 88.6 80.0 - 100.0 fL   MCH 29.1 26.0 - 34.0 pg   MCHC 32.9 30.0 - 36.0 g/dL   RDW 87.9 88.4 - 84.4 %   Platelets 242 150 - 400 K/uL   nRBC 0.0 0.0 - 0.2 %   Neutrophils Relative % 75 %   Neutro Abs 3.9 1.7 - 7.7 K/uL   Lymphocytes Relative 10 %   Lymphs Abs 0.5 (L) 0.7 - 4.0 K/uL   Monocytes Relative 12 %   Monocytes Absolute 0.6 0.1 - 1.0 K/uL   Eosinophils Relative 3 %   Eosinophils Absolute 0.2 0.0 - 0.5 K/uL   Basophils Relative 0 %   Basophils Absolute 0.0 0.0 - 0.1 K/uL   Immature Granulocytes 0 %   Abs Immature Granulocytes 0.02 0.00 - 0.07 K/uL    Comment: Performed at [facility], [address], [place], [place] [address]  Protime-INR     Status: None   Collection Time: 03/13/24  9:54 AM  Result Value  Ref Range   Prothrombin Time 14.2 11.4 - 15.2 seconds   INR 1.0 0.8 - 1.2    Comment: (NOTE) INR goal varies based on device and disease states. Performed at [facility], [address], [place], [place] [address]   [facility] Session Summary     Status: None   Collection Time: 03/14/24 11:35  AM  Result Value Ref Range   Course ID [email or web address]    Course Start Date 02/13/2024    Session Number 17    Course First Treatment Date 02/20/2024  1:33 PM    Course Last Treatment Date 03/14/2024 11:32 AM    Course Elapsed Days 23    Reference Point ID HN_L_LingTon dp    Reference Point Dosage Given to Date 34 Gy   Reference Point Session Dosage Given 2 Gy   Plan ID HN_L_LingTon    Plan Name HN_L_LingTon    Plan Fractions Treated to Date 17    Plan Total Fractions Prescribed 35    Plan Prescribed Dose Per Fraction 2 Gy   Plan Total Prescribed Dose 70.000000 Gy   Plan Primary Reference Point HN_L_LingTon dp   Rad Onc Aria Session Summary     Status: None   Collection Time: 03/17/24 10:41 AM  Result Value Ref Range   Course ID [email or web address]    Course Start Date 02/13/2024    Session Number 18    Course First Treatment Date 02/20/2024  1:33 PM    Course Last Treatment Date 03/17/2024 10:39 AM    Course Elapsed Days 26    Reference Point ID HN_L_LingTon dp    Reference Point Dosage Given to Date 36 Gy   Reference Point Session Dosage Given 2 Gy   Plan ID HN_L_LingTon    Plan Name HN_L_LingTon    Plan Fractions Treated to Date 18    Plan Total Fractions Prescribed 35    Plan Prescribed Dose Per Fraction 2 Gy   Plan Total Prescribed Dose 70.000000 Gy   Plan Primary Reference Point HN_L_LingTon dp   Comprehensive metabolic panel     Status: Abnormal   Collection Time: 03/18/24  9:41 AM  Result Value Ref Range   Sodium 132 (L) 135 - 145 mmol/L   Potassium 4.2 3.5 - 5.1 mmol/L   Chloride 99 98 - 111 mmol/L   CO2 24 22 - 32 mmol/L   Glucose, Bld 130 (H) 70 - 99 mg/dL    Comment:  Glucose reference range applies only to samples taken after fasting for at least 8 hours.   BUN 13 8 - 23 mg/dL   Creatinine, Ser 9.16 0.61 - 1.24 mg/dL   Calcium 9.1 8.9 - 89.6 mg/dL   Total Protein 7.2 6.5 - 8.1 g/dL   Albumin 3.6 3.5 - 5.0 g/dL   AST 28 15 - 41 U/L   ALT 43 0 - 44 U/L   Alkaline Phosphatase 71 38 - 126 U/L   Total Bilirubin 0.6 0.0 - 1.2 mg/dL   GFR, Estimated >39 >39 mL/min    Comment: (NOTE) Calculated using the CKD-EPI Creatinine Equation (2021)    Anion gap 9 5 - 15    Comment: Performed at [facility], [address] [place], [place] [address]  CBC     Status: Abnormal   Collection Time: 03/18/24  9:41 AM  Result Value Ref Range   WBC 7.1 4.0 - 10.5 K/uL   RBC 4.62 4.22 - 5.81 MIL/uL   Hemoglobin 12.9 (L) 13.0 - 17.0 g/dL   HCT 59.2 60.9 - 47.9 %   MCV 88.1 80.0 - 100.0 fL   MCH 27.9 26.0 - 34.0 pg   MCHC 31.7 30.0 - 36.0 g/dL   RDW 87.5 88.4 - 84.4 %   Platelets 237 150 - 400 K/uL   nRBC 0.0 0.0 - 0.2 %  Comment: Performed at [facility], [address] [place], [place] [address]  Type and screen [facility]     Status: None   Collection Time: 03/18/24  9:41 AM  Result Value Ref Range   ABO/RH(D) O NEG    Antibody Screen NEG    Sample Expiration      03/21/2024,2359 Performed at [facility], [address], [place], [place] [address]   POC occult blood, ED     Status: Abnormal   Collection Time: 03/18/24 12:20 PM  Result Value Ref Range   Fecal Occult Bld POSITIVE (A) NEGATIVE  ABO/Rh     Status: None   Collection Time: 03/18/24 12:22 PM  Result Value Ref Range   ABO/RH(D)      MALVA NEG Performed at [facility], [address], [place], [place] [address]   Blood culture (routine x 2)     Status: None   Collection Time: 03/18/24 12:22 PM   Specimen: BLOOD RIGHT ARM  Result Value Ref Range   Specimen Description      BLOOD RIGHT  ARM Performed at [facility], [address], [place], [place] [address]    Special Requests      BOTTLES DRAWN AEROBIC AND ANAEROBIC Blood Culture adequate volume Performed at [facility], [address], [place], [place] [address]    Culture      NO GROWTH 5 DAYS Performed at [facility], [address] [place], [place] [address]    Report Status 03/23/2024 FINAL   Blood culture (routine x 2)     Status: None   Collection Time: 03/18/24 12:22 PM   Specimen: BLOOD LEFT ARM  Result Value Ref Range   Specimen Description      BLOOD LEFT ARM Performed at [facility], [address], [place], [place] [address]    Special Requests      BOTTLES DRAWN AEROBIC AND ANAEROBIC Blood Culture results may not be optimal due to an inadequate volume of blood received in culture bottles Performed at [facility], [address], [place], [place] [address]    Culture      NO GROWTH 5 DAYS Performed at [facility], [address] [place], [place] [address]    Report Status 03/23/2024 FINAL   Hemoglobin and hematocrit, blood     Status: Abnormal   Collection Time: 03/18/24  6:33 PM  Result Value Ref Range   Hemoglobin 12.6 (L) 13.0 - 17.0 g/dL   HCT 59.9 60.9 - 47.9 %    Comment: Performed at [facility], [address], [place], [place] [address]  Urinalysis, Routine w reflex microscopic -Urine, Clean Catch     Status: Abnormal   Collection Time: 03/18/24  6:45 PM  Result Value Ref Range   Color, Urine YELLOW YELLOW   APPearance CLEAR CLEAR   Specific Gravity, Urine >1.046 (H) 1.005 - 1.030   pH 5.0 5.0 - 8.0   Glucose, UA NEGATIVE NEGATIVE mg/dL   Hgb urine dipstick NEGATIVE NEGATIVE   Bilirubin Urine NEGATIVE NEGATIVE   Ketones, ur 5 (A) NEGATIVE mg/dL   Protein, ur NEGATIVE NEGATIVE mg/dL   Nitrite NEGATIVE NEGATIVE   Leukocytes,Ua NEGATIVE NEGATIVE    Comment: Performed at [facility], [address], [place], [place] [address]  Lipase, blood     Status: None   Collection Time: 03/18/24  8:58 PM  Result Value Ref Range   Lipase 29 11 - 51 U/L    Comment: Performed at [facility], [address], [place], [place] [address]  HIV Antibody (routine testing w rflx)     Status: None   Collection Time: 03/19/24  1:01 AM  Result Value Ref Range   HIV Screen 4th Generation wRfx Non Reactive Non Reactive    Comment: Performed at [facility], [address], [place], [place] [address]  Basic metabolic panel     Status: Abnormal   Collection Time: 03/19/24  1:01 AM  Result Value Ref Range   Sodium 136 135 - 145 mmol/L   Potassium 3.9 3.5 - 5.1 mmol/L   Chloride 104 98 - 111 mmol/L   CO2 24 22 - 32 mmol/L   Glucose, Bld 104 (H) 70 - 99 mg/dL    Comment: Glucose reference range applies only to samples taken after fasting for at least 8 hours.   BUN 10 8 - 23 mg/dL   Creatinine, Ser 9.31 0.61 - 1.24 mg/dL   Calcium 8.7 (L) 8.9 - 10.3 mg/dL   GFR, Estimated >39 >39 mL/min    Comment: (NOTE) Calculated using the CKD-EPI Creatinine Equation (2021)    Anion gap 8 5 - 15    Comment: Performed at [facility], [address], [place], [place] [address]  CBC     Status: Abnormal   Collection Time: 03/19/24  1:01 AM  Result Value Ref Range   WBC 8.1 4.0 - 10.5 K/uL   RBC 4.13 (L) 4.22 - 5.81 MIL/uL   Hemoglobin 11.7 (L) 13.0 - 17.0 g/dL   HCT 63.2 (L) 60.9 - 47.9 %   MCV 88.9 80.0 - 100.0 fL   MCH 28.3 26.0 - 34.0 pg   MCHC 31.9 30.0 - 36.0 g/dL   RDW 87.4 88.4 - 84.4 %   Platelets 204 150 - 400 K/uL   nRBC 0.0 0.0 - 0.2 %    Comment: Performed at [facility], [address], [place], [place] [address]  Hemoglobin and hematocrit, blood     Status: Abnormal   Collection Time: 03/19/24  8:23 AM  Result Value Ref Range   Hemoglobin 11.9 (L) 13.0 - 17.0 g/dL   HCT 62.4 (L) 60.9 - 47.9 %    Comment:  Performed at [facility], [address] [place], [place] [address]  Rad Powell Blizzard Session Summary     Status: None   Collection Time: 03/19/24 10:39 AM  Result Value Ref Range   Course ID [email or web address]    Course Start Date 02/13/2024    Session Number 19    Course First Treatment Date 02/20/2024  1:33 PM    Course Last Treatment Date 03/19/2024 10:37 AM    Course Elapsed Days 28    Reference Point ID HN_L_LingTon dp    Reference Point Dosage Given to Date 38 Gy   Reference Point Session Dosage Given 2 Gy   Plan ID HN_L_LingTon    Plan Name HN_L_LingTon    Plan Fractions Treated to Date 19    Plan Total Fractions Prescribed 35    Plan Prescribed Dose Per Fraction 2 Gy   Plan Total Prescribed Dose 70.000000 Gy   Plan Primary Reference Point HN_L_LingTon dp   Glucose, capillary     Status: None   Collection Time: 03/19/24  4:08 PM  Result Value Ref Range   Glucose-Capillary  85 70 - 99 mg/dL    Comment: Glucose reference range applies only to samples taken after fasting for at least 8 hours.  Hemoglobin and hematocrit, blood     Status: Abnormal   Collection Time: 03/19/24  4:33 PM  Result Value Ref Range   Hemoglobin 12.9 (L) 13.0 - 17.0 g/dL   HCT 58.6 60.9 - 47.9 %    Comment: Performed at [facility], [address] [place], [place] [address]  Glucose, capillary     Status: Abnormal   Collection Time: 03/19/24  7:48 PM  Result Value Ref Range   Glucose-Capillary 121 (H) 70 - 99 mg/dL    Comment: Glucose reference range applies only to samples taken after fasting for at least 8 hours.  Glucose, capillary     Status: None   Collection Time: 03/20/24  1:38 AM  Result Value Ref Range   Glucose-Capillary 98 70 - 99 mg/dL    Comment: Glucose reference range applies only to samples taken after fasting for at least 8 hours.  Glucose, capillary     Status: None   Collection Time: 03/20/24  4:02 AM  Result Value Ref Range   Glucose-Capillary 99 70 - 99  mg/dL    Comment: Glucose reference range applies only to samples taken after fasting for at least 8 hours.  Glucose, capillary     Status: Abnormal   Collection Time: 03/20/24  7:24 AM  Result Value Ref Range   Glucose-Capillary 101 (H) 70 - 99 mg/dL    Comment: Glucose reference range applies only to samples taken after fasting for at least 8 hours.  CBC     Status: Abnormal   Collection Time: 03/20/24  8:43 AM  Result Value Ref Range   WBC 5.9 4.0 - 10.5 K/uL   RBC 4.33 4.22 - 5.81 MIL/uL   Hemoglobin 12.3 (L) 13.0 - 17.0 g/dL   HCT 60.9 60.9 - 47.9 %   MCV 90.1 80.0 - 100.0 fL   MCH 28.4 26.0 - 34.0 pg   MCHC 31.5 30.0 - 36.0 g/dL   RDW 87.4 88.4 - 84.4 %   Platelets 230 150 - 400 K/uL   nRBC 0.0 0.0 - 0.2 %    Comment: Performed at [facility], [address] [place], [place] [address]  Basic metabolic panel     Status: Abnormal   Collection Time: 03/20/24  8:43 AM  Result Value Ref Range   Sodium 135 135 - 145 mmol/L   Potassium 4.2 3.5 - 5.1 mmol/L   Chloride 102 98 - 111 mmol/L   CO2 25 22 - 32 mmol/L   Glucose, Bld 100 (H) 70 - 99 mg/dL    Comment: Glucose reference range applies only to samples taken after fasting for at least 8 hours.   BUN 11 8 - 23 mg/dL   Creatinine, Ser 9.10 0.61 - 1.24 mg/dL   Calcium 8.9 8.9 - 89.6 mg/dL   GFR, Estimated >39 >39 mL/min    Comment: (NOTE) Calculated using the CKD-EPI Creatinine Equation (2021)    Anion gap 8 5 - 15    Comment: Performed at [facility], [address], [place], [place] [address]  Rad Powell Blizzard Session Summary     Status: None   Collection Time: 03/20/24 10:35 AM  Result Value Ref Range   Course ID [email or web address]    Course Start Date 02/13/2024    Session Number 20    Course  First Treatment Date 02/20/2024  1:33 PM    Course Last Treatment Date 03/20/2024 10:33 AM    Course Elapsed Days 29    Reference Point ID HN_L_LingTon dp    Reference Point Dosage Given to Date 40 Gy    Reference Point Session Dosage Given 2 Gy   Plan ID HN_L_LingTon    Plan Name HN_L_LingTon    Plan Fractions Treated to Date 20    Plan Total Fractions Prescribed 35    Plan Prescribed Dose Per Fraction 2 Gy   Plan Total Prescribed Dose 70.000000 Gy   Plan Primary Reference Point HN_L_LingTon dp   Glucose, capillary     Status: Abnormal   Collection Time: 03/20/24 11:10 AM  Result Value Ref Range   Glucose-Capillary 156 (H) 70 - 99 mg/dL    Comment: Glucose reference range applies only to samples taken after fasting for at least 8 hours.  Rad Powell Blizzard Session Summary     Status: None   Collection Time: 03/21/24 10:09 AM  Result Value Ref Range   Course ID [email or web address]    Course Start Date 02/13/2024    Session Number 21    Course First Treatment Date 02/20/2024  1:33 PM    Course Last Treatment Date 03/21/2024 10:07 AM    Course Elapsed Days 30    Reference Point ID HN_L_LingTon dp    Reference Point Dosage Given to Date 42 Gy   Reference Point Session Dosage Given 2 Gy   Plan ID HN_L_LingTon    Plan Name HN_L_LingTon    Plan Fractions Treated to Date 21    Plan Total Fractions Prescribed 35    Plan Prescribed Dose Per Fraction 2 Gy   Plan Total Prescribed Dose 70.000000 Gy   Plan Primary Reference Point HN_L_LingTon dp   Rad Onc Aria Session Summary     Status: None   Collection Time: 03/24/24 10:20 AM  Result Value Ref Range   Course ID [email or web address]    Course Start Date 02/13/2024    Session Number 22    Course First Treatment Date 02/20/2024  1:33 PM    Course Last Treatment Date 03/24/2024 10:18 AM    Course Elapsed Days 33    Reference Point ID HN_L_LingTon dp    Reference Point Dosage Given to Date 44 Gy   Reference Point Session Dosage Given 2 Gy   Plan ID HN_L_LingTon    Plan Name HN_L_LingTon    Plan Fractions Treated to Date 22    Plan Total Fractions Prescribed 35    Plan Prescribed Dose Per Fraction 2 Gy   Plan Total Prescribed Dose 70.000000 Gy   Plan Primary Reference  Point HN_L_LingTon dp   Rad Onc Aria Session Summary     Status: None   Collection Time: 03/25/24 10:38 AM  Result Value Ref Range   Course ID [email or web address]    Course Start Date 02/13/2024    Session Number 23    Course First Treatment Date 02/20/2024  1:33 PM    Course Last Treatment Date 03/25/2024 10:36 AM    Course Elapsed Days 34    Reference Point ID HN_L_LingTon dp    Reference Point Dosage Given to Date 46 Gy   Reference Point Session Dosage Given 2 Gy   Plan ID HN_L_LingTon    Plan Name HN_L_LingTon    Plan Fractions Treated to Date 23    Plan Total Fractions Prescribed 35    Plan Prescribed Dose Per Fraction 2 Gy  Plan Total Prescribed Dose 70.000000 Gy   Plan Primary Reference Point HN_L_LingTon dp   Rad Onc Aria Session Summary     Status: None   Collection Time: 03/26/24 10:32 AM  Result Value Ref Range   Course ID [email or web address]    Course Start Date 02/13/2024    Session Number 24    Course First Treatment Date 02/20/2024  1:33 PM    Course Last Treatment Date 03/26/2024 10:28 AM    Course Elapsed Days 35    Reference Point ID HN_L_LingTon dp    Reference Point Dosage Given to Date 48 Gy   Reference Point Session Dosage Given 2 Gy   Plan ID HN_L_LingTon    Plan Name HN_L_LingTon    Plan Fractions Treated to Date 24    Plan Total Fractions Prescribed 35    Plan Prescribed Dose Per Fraction 2 Gy   Plan Total Prescribed Dose 70.000000 Gy   Plan Primary Reference Point HN_L_LingTon dp   Comprehensive metabolic panel     Status: None   Collection Time: 03/26/24  9:09 PM  Result Value Ref Range   Sodium 135 135 - 145 mmol/L   Potassium 4.5 3.5 - 5.1 mmol/L   Chloride 98 98 - 111 mmol/L   CO2 25 22 - 32 mmol/L   Glucose, Bld 99 70 - 99 mg/dL    Comment: Glucose reference range applies only to samples taken after fasting for at least 8 hours.   BUN 10 8 - 23 mg/dL   Creatinine, Ser 9.01 0.61 - 1.24 mg/dL   Calcium 9.6 8.9 - 89.6 mg/dL   Total Protein 7.2 6.5 - 8.1 g/dL   Albumin  4.3 3.5 - 5.0 g/dL   AST 18 15 - 41 U/L   ALT 29 0 - 44 U/L   Alkaline Phosphatase 73 38 - 126 U/L   Total Bilirubin 0.5 0.0 - 1.2 mg/dL   GFR, Estimated >39 >39 mL/min    Comment: (NOTE) Calculated using the CKD-EPI Creatinine Equation (2021)    Anion gap 11 5 - 15    Comment: Performed at [facility], [address] [place], [place] [address]  CBC     Status: None   Collection Time: 03/26/24  9:09 PM  Result Value Ref Range   WBC 5.1 4.0 - 10.5 K/uL   RBC 4.47 4.22 - 5.81 MIL/uL   Hemoglobin 13.0 13.0 - 17.0 g/dL   HCT 60.7 60.9 - 47.9 %   MCV 87.7 80.0 - 100.0 fL   MCH 29.1 26.0 - 34.0 pg   MCHC 33.2 30.0 - 36.0 g/dL   RDW 87.4 88.4 - 84.4 %   Platelets 276 150 - 400 K/uL   nRBC 0.0 0.0 - 0.2 %    Comment: Performed at [facility], [address] [place], [place] [address]  Rad Powell Blizzard Session Summary     Status: None   Collection Time: 03/27/24 10:25 AM  Result Value Ref Range   Course ID [email or web address]    Course Start Date 02/13/2024    Session Number 25    Course First Treatment Date 02/20/2024  1:33 PM    Course Last Treatment Date 03/27/2024 10:24 AM    Course Elapsed Days 36    Reference Point ID HN_L_LingTon dp    Reference Point Dosage Given to Date 50 Gy   Reference Point Session Dosage Given 2 Gy   Plan ID HN_L_LingTon    Plan  Name HN_L_LingTon    Plan Fractions Treated to Date 25    Plan Total Fractions Prescribed 35    Plan Prescribed Dose Per Fraction 2 Gy   Plan Total Prescribed Dose 70.000000 Gy   Plan Primary Reference Point HN_L_LingTon dp   Rad Onc Aria Session Summary     Status: None   Collection Time: 03/28/24 10:25 AM  Result Value Ref Range   Course ID [email or web address]    Course Start Date 02/13/2024    Session Number 26    Course First Treatment Date 02/20/2024  1:33 PM    Course Last Treatment Date 03/28/2024 10:23 AM    Course Elapsed Days 37    Reference Point ID HN_L_LingTon dp    Reference Point Dosage Given to Date 52 Gy    Reference Point Session Dosage Given 2 Gy   Plan ID HN_L_LingTon    Plan Name HN_L_LingTon    Plan Fractions Treated to Date 26    Plan Total Fractions Prescribed 35    Plan Prescribed Dose Per Fraction 2 Gy   Plan Total Prescribed Dose 70.000000 Gy   Plan Primary Reference Point HN_L_LingTon dp   CBC with Differential     Status: Abnormal   Collection Time: 03/30/24 11:14 PM  Result Value Ref Range   WBC 5.1 4.0 - 10.5 K/uL   RBC 4.35 4.22 - 5.81 MIL/uL   Hemoglobin 12.5 (L) 13.0 - 17.0 g/dL   HCT 61.3 (L) 60.9 - 47.9 %   MCV 88.7 80.0 - 100.0 fL   MCH 28.7 26.0 - 34.0 pg   MCHC 32.4 30.0 - 36.0 g/dL   RDW 87.4 88.4 - 84.4 %   Platelets 240 150 - 400 K/uL   nRBC 0.0 0.0 - 0.2 %   Neutrophils Relative % 69 %   Neutro Abs 3.6 1.7 - 7.7 K/uL   Lymphocytes Relative 8 %   Lymphs Abs 0.4 (L) 0.7 - 4.0 K/uL   Monocytes Relative 17 %   Monocytes Absolute 0.9 0.1 - 1.0 K/uL   Eosinophils Relative 5 %   Eosinophils Absolute 0.3 0.0 - 0.5 K/uL   Basophils Relative 1 %   Basophils Absolute 0.0 0.0 - 0.1 K/uL   Immature Granulocytes 0 %   Abs Immature Granulocytes 0.01 0.00 - 0.07 K/uL    Comment: Performed at [facility], [address] [place], [place] [address]  Comprehensive metabolic panel     Status: Abnormal   Collection Time: 03/30/24 11:14 PM  Result Value Ref Range   Sodium 139 135 - 145 mmol/L   Potassium 4.7 3.5 - 5.1 mmol/L   Chloride 102 98 - 111 mmol/L   CO2 27 22 - 32 mmol/L   Glucose, Bld 102 (H) 70 - 99 mg/dL    Comment: Glucose reference range applies only to samples taken after fasting for at least 8 hours.   BUN 14 8 - 23 mg/dL   Creatinine, Ser 8.97 0.61 - 1.24 mg/dL   Calcium 9.5 8.9 - 89.6 mg/dL   Total Protein 6.9 6.5 - 8.1 g/dL   Albumin 4.0 3.5 - 5.0 g/dL   AST 17 15 - 41 U/L   ALT 27 0 - 44 U/L   Alkaline Phosphatase 68 38 - 126 U/L   Total Bilirubin 0.3 0.0 - 1.2 mg/dL   GFR, Estimated >39 >39 mL/min    Comment: (NOTE) Calculated  using the CKD-EPI Creatinine Equation (2021)    Anion gap 10  5 - 15    Comment: Performed at [facility], [address] [place], [place] [address]  Lipase, blood     Status: None   Collection Time: 03/30/24 11:14 PM  Result Value Ref Range   Lipase 32 11 - 51 U/L    Comment: Performed at [facility], [address] [place], [place] [address]  El Powell Blizzard Session Summary     Status: None   Collection Time: 04/01/24 11:46 AM  Result Value Ref Range   Course ID [email or web address]    Course Start Date 02/13/2024    Session Number 27    Course First Treatment Date 02/20/2024  1:33 PM    Course Last Treatment Date 04/01/2024 11:44 AM    Course Elapsed Days 41    Reference Point ID HN_L_LingTon dp    Reference Point Dosage Given to Date 54 Gy   Reference Point Session Dosage Given 2 Gy   Plan ID HN_L_LingTon    Plan Name HN_L_LingTon    Plan Fractions Treated to Date 27    Plan Total Fractions Prescribed 35    Plan Prescribed Dose Per Fraction 2 Gy   Plan Total Prescribed Dose 70.000000 Gy   Plan Primary Reference Point HN_L_LingTon dp   Rad Onc Aria Session Summary     Status: None   Collection Time: 04/02/24 10:32 AM  Result Value Ref Range   Course ID [email or web address]    Course Start Date 02/13/2024    Session Number 28    Course First Treatment Date 02/20/2024  1:33 PM    Course Last Treatment Date 04/02/2024 10:30 AM    Course Elapsed Days 42    Reference Point ID HN_L_LingTon dp    Reference Point Dosage Given to Date 56 Gy   Reference Point Session Dosage Given 2 Gy   Plan ID HN_L_LingTon    Plan Name HN_L_LingTon    Plan Fractions Treated to Date 28    Plan Total Fractions Prescribed 35    Plan Prescribed Dose Per Fraction 2 Gy   Plan Total Prescribed Dose 70.000000 Gy   Plan Primary Reference Point HN_L_LingTon dp   Protime-INR     Status: None   Collection Time: 04/03/24  9:15 AM  Result Value Ref Range   Prothrombin Time 14.2 11.4 - 15.2 seconds   INR 1.0 0.8 - 1.2     Comment: (NOTE) INR goal varies based on device and disease states. Performed at [facility], [address] [place], [place] [address]   CBC     Status: None   Collection Time: 04/03/24  9:15 AM  Result Value Ref Range   WBC 5.4 4.0 - 10.5 K/uL   RBC 4.94 4.22 - 5.81 MIL/uL   Hemoglobin 14.3 13.0 - 17.0 g/dL   HCT 55.9 60.9 - 47.9 %   MCV 89.1 80.0 - 100.0 fL   MCH 28.9 26.0 - 34.0 pg   MCHC 32.5 30.0 - 36.0 g/dL   RDW 87.6 88.4 - 84.4 %   Platelets 241 150 - 400 K/uL   nRBC 0.0 0.0 - 0.2 %    Comment: Performed at [facility], [address], [place], [place] [address]  Glucose, capillary     Status: Abnormal   Collection Time: 04/07/24 11:06 AM  Result Value Ref Range   Glucose-Capillary 124 (H) 70 - 99 mg/dL    Comment: Performed at [facility], [address], [place], [place] [address]  El Powell Blizzard Session Summary     Status: None   Collection Time: 04/07/24  2:39 PM  Result Value Ref Range   Course ID [email or web address]    Course Start Date 02/13/2024    Session Number 29    Course First Treatment Date 02/20/2024  1:33 PM    Course Last Treatment Date 04/07/2024  2:36 PM    Course Elapsed Days 47    Reference Point ID HN_L_LingTon dp    Reference Point Dosage Given to Date 58 Gy   Reference Point Session Dosage Given 2 Gy   Plan ID HN_L_LingTon    Plan Name HN_L_LingTon    Plan Fractions Treated to Date 29    Plan Total Fractions Prescribed 35    Plan Prescribed Dose Per Fraction 2 Gy   Plan Total Prescribed Dose 70.000000 Gy   Plan Primary Reference Point HN_L_LingTon dp   Rad Onc Aria Session Summary     Status: None   Collection Time: 04/08/24 10:29 AM  Result Value Ref Range   Course ID [email or web address]    Course Start Date 02/13/2024    Session Number 30    Course First Treatment Date 02/20/2024  1:33 PM    Course Last Treatment Date 04/08/2024 10:26 AM    Course Elapsed Days 48    Reference Point ID HN_L_LingTon dp    Reference Point Dosage Given to Date  60 Gy   Reference Point Session Dosage Given 2 Gy   Plan ID HN_L_LingTon    Plan Name HN_L_LingTon    Plan Fractions Treated to Date 30    Plan Total Fractions Prescribed 35    Plan Prescribed Dose Per Fraction 2 Gy   Plan Total Prescribed Dose 70.000000 Gy   Plan Primary Reference Point HN_L_LingTon dp   Rad Onc Aria Session Summary     Status: None   Collection Time: 04/09/24 10:30 AM  Result Value Ref Range   Course ID [email or web address]    Course Start Date 02/13/2024    Session Number 31    Course First Treatment Date 02/20/2024  1:33 PM    Course Last Treatment Date 04/09/2024 10:28 AM    Course Elapsed Days 49    Reference Point ID HN_L_LingTon dp    Reference Point Dosage Given to Date 62 Gy   Reference Point Session Dosage Given 2 Gy   Plan ID HN_L_LingTon    Plan Name HN_L_LingTon    Plan Fractions Treated to Date 31    Plan Total Fractions Prescribed 35    Plan Prescribed Dose Per Fraction 2 Gy   Plan Total Prescribed Dose 70.000000 Gy   Plan Primary Reference Point HN_L_LingTon dp   Rad Onc Aria Session Summary     Status: None   Collection Time: 04/10/24 10:52 AM  Result Value Ref Range   Course ID [email or web address]    Course Start Date 02/13/2024    Session Number 32    Course First Treatment Date 02/20/2024  1:33 PM    Course Last Treatment Date 04/10/2024 10:50 AM    Course Elapsed Days 50    Reference [place] ID HN_L_LingTon dp    [place] Dosage Given to Date 64 Gy   [place] Session Dosage Given 2 Gy   Plan ID HN_L_LingTon    Plan Name HN_L_LingTon    Plan Fractions Treated to Date 32    Plan Total Fractions Prescribed 35    Plan Prescribed Dose Per  Fraction 2 Gy   Plan Total Prescribed Dose 70.000000 Gy   Plan Primary [place] HN_L_LingTon dp   CMP (Cancer Center only)     Status: Abnormal   Collection Time: 04/10/24 11:46 AM  Result Value Ref Range   Sodium 138 135 - 145 mmol/L   Potassium 4.6 3.5 - 5.1 mmol/L   Chloride 102 98 - 111 mmol/L   CO2 33 (H)  22 - 32 mmol/L   Glucose, Bld 108 (H) 70 - 99 mg/dL    Comment: Glucose reference range applies only to samples taken after fasting for at least 8 hours.   BUN 11 8 - 23 mg/dL   Creatinine 9.00 9.38 - 1.24 mg/dL   Calcium 9.6 8.9 - 89.6 mg/dL   Total Protein 7.3 6.5 - 8.1 g/dL   Albumin 4.3 3.5 - 5.0 g/dL   AST 24 15 - 41 U/L   ALT 34 0 - 44 U/L   Alkaline Phosphatase 78 38 - 126 U/L   Total Bilirubin 0.5 0.0 - 1.2 mg/dL   GFR, Estimated >39 >39 mL/min    Comment: (NOTE) Calculated using the CKD-EPI Creatinine Equation (2021)    Anion gap 3 (L) 5 - 15    Comment: Performed at [facility], [address] [place], [place] [address]  CBC with Differential (Cancer Center Only)     Status: Abnormal   Collection Time: 04/10/24 11:46 AM  Result Value Ref Range   WBC Count 5.3 4.0 - 10.5 K/uL   RBC 4.73 4.22 - 5.81 MIL/uL   Hemoglobin 13.8 13.0 - 17.0 g/dL   HCT 58.1 60.9 - 47.9 %   MCV 88.4 80.0 - 100.0 fL   MCH 29.2 26.0 - 34.0 pg   MCHC 33.0 30.0 - 36.0 g/dL   RDW 87.1 88.4 - 84.4 %   Platelet Count 211 150 - 400 K/uL   nRBC 0.0 0.0 - 0.2 %   Neutrophils Relative % 77 %   Neutro Abs 4.1 1.7 - 7.7 K/uL   Lymphocytes Relative 7 %   Lymphs Abs 0.4 (L) 0.7 - 4.0 K/uL   Monocytes Relative 14 %   Monocytes Absolute 0.7 0.1 - 1.0 K/uL   Eosinophils Relative 2 %   Eosinophils Absolute 0.1 0.0 - 0.5 K/uL   Basophils Relative 0 %   Basophils Absolute 0.0 0.0 - 0.1 K/uL   Immature Granulocytes 0 %   Abs Immature Granulocytes 0.02 0.00 - 0.07 K/uL    Comment: Performed at [facility], [address] [place], [place] [address]  Rad [place] Blizzard Session Summary     Status: None   Collection Time: 04/11/24 10:54 AM  Result Value Ref Range   Course ID [email or web address]    Course Start Date 02/13/2024    Session Number 33    Course First Treatment Date 02/20/2024  1:33 PM    Course Last Treatment Date 04/11/2024 10:52 AM    Course Elapsed Days 51     Reference [place] ID HN_L_LingTon dp    [place] Dosage Given to Date 66 Gy   [place] Session Dosage Given 2 Gy   Plan ID HN_L_LingTon    Plan Name HN_L_LingTon    Plan Fractions Treated to Date 33    Plan Total Fractions Prescribed 35    Plan Prescribed Dose Per Fraction 2 Gy   Plan Total Prescribed Dose 70.000000 Gy   Plan Primary [place] HN_L_LingTon dp  Rad [place] Session Summary     Status: None   Collection Time: 04/14/24 10:58 AM  Result Value Ref Range   Course ID C1_HN    Course Start Date 02/13/2024    Session Number 34    Course First Treatment Date 02/20/2024  1:33 PM    Course Last Treatment Date 04/14/2024 10:56 AM    Course Elapsed Days 54    [place] ID HN_L_LingTon dp    [place] Dosage Given to Date 68 Gy   [place] Session Dosage Given 2 Gy   Plan ID HN_L_LingTon    Plan Name HN_L_LingTon    Plan Fractions Treated to Date 74    Plan Total Fractions Prescribed 35    Plan Prescribed Dose Per Fraction 2 Gy   Plan Total Prescribed Dose 70.000000 Gy   Plan Primary [place] HN_L_LingTon dp   Rad Onc Aria Session Summary     Status: None   Collection Time: 04/15/24  8:04 AM  Result Value Ref Range   Course ID C1_HN    Course Start Date 02/13/2024    Session Number 35    Course First Treatment Date 02/20/2024  1:33 PM    Course Last Treatment Date 04/15/2024  8:02 AM    Course Elapsed Days 55    [place] ID HN_L_LingTon dp    [place] Dosage Given to Date 70 Gy   [place] Session Dosage Given 2 Gy   Plan ID HN_L_LingTon    Plan Name HN_L_LingTon    Plan Fractions Treated to Date 37    Plan Total Fractions Prescribed 35    Plan Prescribed Dose Per Fraction 2 Gy   Plan Total Prescribed Dose 70.000000 Gy   Plan Primary [place] HN_L_LingTon dp   Phosphorus     Status: None   Collection Time: 04/16/24 11:15 AM  Result Value Ref Range   Phosphorus 3.1 2.5 - 4.6 mg/dL    Comment:  Performed at [facility], [address] [place], [place] [address]  Magnesium     Status: None   Collection Time: 04/16/24 11:15 AM  Result Value Ref Range   Magnesium 2.3 1.7 - 2.4 mg/dL    Comment: Performed at [facility], [address] [place], [place] [address]  CMP (Cancer Center only)     Status: Abnormal   Collection Time: 04/16/24 11:15 AM  Result Value Ref Range   Sodium 139 135 - 145 mmol/L   Potassium 4.3 3.5 - 5.1 mmol/L   Chloride 105 98 - 111 mmol/L   CO2 30 22 - 32 mmol/L   Glucose, Bld 140 (H) 70 - 99 mg/dL    Comment: Glucose reference range applies only to samples taken after fasting for at least 8 hours.   BUN 16 8 - 23 mg/dL   Creatinine 9.00 9.38 - 1.24 mg/dL   Calcium 9.4 8.9 - 89.6 mg/dL   Total Protein 7.2 6.5 - 8.1 g/dL   Albumin 4.2 3.5 - 5.0 g/dL   AST 18 15 - 41 U/L   ALT 23 0 - 44 U/L   Alkaline Phosphatase 72 38 - 126 U/L   Total Bilirubin 0.6 0.0 - 1.2 mg/dL   GFR, Estimated >39 >39 mL/min    Comment: (NOTE) Calculated using the CKD-EPI Creatinine Equation (2021)    Anion gap 4 (L) 5 - 15    Comment: Performed at [facility], [address], [place], [place] [address]     Psychiatric Specialty Exam: Physical Exam  Review of Systems  Constitutional:  Positive for fatigue and unexpected weight change.  Gastrointestinal:        Feeding tube  Psychiatric/Behavioral:  Positive for sleep disturbance. The patient is nervous/anxious.     Weight 195 lb (88.5 kg).There is no height or weight on file to calculate BMI.  General Appearance: Casual  Eye Contact:  Good  Speech:  Clear and Coherent  Volume:  Normal  Mood:  Anxious and Dysphoric  Affect:  Appropriate  Thought Process:  Goal Directed  Orientation:  Full (Time, Place, and Person)  Thought Content:  Rumination  Suicidal Thoughts:  No  Homicidal Thoughts:  No  Memory:  Immediate;   Good Recent;   Good Remote;    Good  Judgement:  Intact  Insight:  Present  Psychomotor Activity:  Decreased  Concentration:  Concentration: Good and Attention Span: Good  Recall:  Good  Fund of Knowledge:  Good  Language:  Good  Akathisia:  No  Handed:  Right  AIMS (if indicated):     Assets:  Communication Skills Desire for Improvement Housing Resilience Social Support Transportation  ADL's:  Intact  Cognition:  WNL  Sleep:  fair       03/14/2024    8:25 AM 03/11/2024   10:29 AM 02/21/2024   11:35 AM 02/06/2024    1:28 PM 02/06/2024    1:20 PM  Depression screen PHQ 2/9  Decreased Interest 0 0 0 0 0  Down, Depressed, Hopeless 0 0 0 0 0  PHQ - 2 Score 0 0 0 0 0    Assessment/Plan: Bipolar I disorder, most recent episode depressed (HCC) - Plan: buPROPion  (WELLBUTRIN  XL) 150 MG 24 hr tablet, lamoTRIgine  (LAMICTAL ) 150 MG tablet  Insomnia, unspecified type - Plan: mirtazapine  (REMERON ) 15 MG tablet  Anxiety - Plan: buPROPion  (WELLBUTRIN  XL) 150 MG 24 hr tablet  Patient is 63 year old man with history of bipolar disorder, anxiety and insomnia and 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  Discussed chronic fatigue which is related to recent treatment but slowly and gradually getting better.  Reviewed blood work results and current medication.  Labs are stable.  He is no longer taking oxycodone , Ativan , blood pressure medication.  He admitted some anxiety and nervousness but believe situational and does not want to take any new medication but promised to give us  a callback if needed in the future.  He is also not interested in therapy because he had a good support from his sister and church friends.  He is going to stay at her sister's house for now.  He is hoping once feeding tube is removed he can go back to work.  Discussed medication side effects and benefits.  Discussed mirtazapine  can help weight and sleep and he also preferred to keep the same medication.  Continue mirtazapine  15 mg at bedtime,  Wellbutrin  XL 150 mg daily and Lamictal  150 mg daily.  Elect to keep the appointment in 3 months.  Recommend to call back if there is any question, concern or refill worsening of symptoms.  Will follow-up in 3 months   Follow Up Instructions:     I discussed the assessment and treatment plan with the patient. The patient was provided an opportunity to ask questions and all were answered. The patient agreed with the plan and demonstrated an understanding of the instructions.   The patient was advised to call back  or seek an in-person evaluation if the symptoms worsen or if the condition fails to improve as anticipated.    Collaboration of Care: Other provider involved in patient's care AEB notes are available in epic to review  Patient/Guardian was advised Release of Information must be obtained prior to any record release in order to collaborate their care with an outside provider. Patient/Guardian was advised if they have not already done so to contact the registration department to sign all necessary forms in order for us  to release information regarding their care.   Consent: Patient/Guardian gives verbal consent for treatment and assignment of benefits for services provided during this visit. Patient/Guardian expressed understanding and agreed to proceed.     Total encounter time 29 minutes which includes face-to-face time, chart reviewed, care coordination, order entry and documentation during this encounter.   Note: This document was prepared by [employer] voice dictation technology and any errors that results from this process are unintentional.    Leni ONEIDA Client, MD 04/21/2024

## 2024-04-22 ENCOUNTER — Telehealth: Payer: Self-pay | Admitting: Dietician

## 2024-04-22 ENCOUNTER — Inpatient Hospital Stay: Admitting: Dietician

## 2024-04-22 NOTE — Telephone Encounter (Signed)
 Nutrition Follow-up:  Patient with SCC of oropharynx, p 16+.  He is receiving radiation therapy under the care of Dr Izell.     G-tube replaced on 9/4.    Spoke with patient via telephone. Patient reports doing ok. Feels he should be further along in the healing process, despite family and friends telling him he looks well. States he is aware of his impatience at baseline. Patient asking for timing of expected improvement in symptoms.   Throat is raw and sore. Causing discomfort. Denies pain with swallow unless throat is dry. He is sipping on water all day. This does not taste right, but tolerable. Drinking ~60 ounces. Patient tried creamed potatoes yesterday. This was horrible. Patient has not been doing baking soda salt water gargles recently.  He is giving 4 cartons of [employer] 1.5 with 30 ml flush before and after bolus. States he is tolerating feedings well.   Medications: reviewed   Labs: 9/17 glucose 140 K/Mg/Phos - WNL  Anthropometrics: Last wt 195 lb on 9/15 (aria)  9/11 - 196 lb 8/31 - 195 lb  9/2 - 200.2 lb  8/25 - 203.4 lb    NUTRITION DIAGNOSIS: Inadequate oral intake - addressing with TF   INTERVENTION:  Encouraged to practice self kindness/grace as he continues to heal - discussed timing of healing varies, but should notice improvements ~6 weeks Continue KF 1.4 via tube - 325 ml carton x4/day Encourage baking soda salt water rinses several times daily and before po trials Suggested po trials of soft smooth foods (pudding, applesauce, strained creamy soups, ice cream) Support and encouragement    MONITORING, EVALUATION, GOAL: wt trends, intake, TF   NEXT VISIT: Tuesday October 7 in clinic

## 2024-04-23 ENCOUNTER — Telehealth: Payer: Self-pay

## 2024-04-23 NOTE — Telephone Encounter (Signed)
 [facility] CSW Progress Note  Clinical Social Worker contacted patient to discuss Head and Neck Support Group, as another layer of support. Patient did not answer , CSW left general voicemail with details of group and inviting him to attend.   Lizbeth Sprague, LCSW Clinical Social Worker [facility]

## 2024-04-28 ENCOUNTER — Telehealth (HOSPITAL_COMMUNITY): Payer: Self-pay | Admitting: *Deleted

## 2024-04-28 MED ORDER — CLONAZEPAM 0.5 MG PO TABS: ORAL_TABLET | ORAL | 0 refills | Status: DC

## 2024-04-28 NOTE — Telephone Encounter (Signed)
 Pt LVM stating that he realized after visit that he has been having panic attacks and would like something to take the edge off. Writer returned call but did not get an answer.   Last visit: 04/21/24 Next visit: 07/17/24

## 2024-04-28 NOTE — Telephone Encounter (Signed)
 Patient called back and reported that he took the Ativan  in the past that did not help his anxiety.  He reported anxiety is on and off and situational because of the feeding tube.  Recommended try 0.5 mg Klonopin to help with anxiety.  Discontinue Ativan .  He is hoping anxiety get better once he will be off from feeding tube.  I will call Klonopin 0.5 mg #15 to take as needed for severe anxiety to his pharmacy.

## 2024-04-28 NOTE — Telephone Encounter (Signed)
 He was given Ativan  0.5 mg by his cancer doctor which he was not taking as per his last visit.  I would recommend should take the Ativan  as needed to calm down his anxiety attacks.  If he feel anxious every day then may need to go up on mirtazapine  from 15 mg to 30 mg.  Please check with the patient and let me know.  Thank you

## 2024-04-29 ENCOUNTER — Ambulatory Visit: Attending: Radiation Oncology

## 2024-04-29 DIAGNOSIS — R1310 Dysphagia, unspecified: Secondary | ICD-10-CM | POA: Diagnosis present

## 2024-04-29 NOTE — Patient Instructions (Signed)
   Alternative to Shaker:  1.   Chin tuck - Place a rolled up towel (4 inches wide) under your chin near your neck - Tuck your chin and push hard on the towel for 5 seconds - Do at least 20 reps/day, in sets of 5-10

## 2024-04-29 NOTE — Therapy (Signed)
 OUTPATIENT SPEECH LANGUAGE PATHOLOGY ONCOLOGY TREATMENT   Patient Name: Douglas Hester MRN: [id number] DOB:22-Feb-1961, 63 y.o., male Today's Date: 04/29/2024  PCP: Katina Pfeiffer, MD REFERRING PROVIDER: Montell Domino, MD  END OF SESSION:  End of Session - 04/29/24 1105     Visit Number 2    Number of Visits 3    Date for Recertification  06/04/24    SLP Start Time 1021    SLP Stop Time  1055    SLP Time Calculation (min) 34 min    Activity Tolerance Patient tolerated treatment well           Past Medical History:  Diagnosis Date   Anxiety    Chronic GERD    Heart murmur    Hypertension    Tachycardia    Past Surgical History:  Procedure Laterality Date   CHOLECYSTECTOMY N/A 10/11/2018   Procedure: LAPAROSCOPIC CHOLECYSTECTOMY WITH INTRAOPERATIVE CHOLANGIOGRAM;  Surgeon: Curvin Deward MOULD, MD;  Location: [facility] ORS;  Service: General;  Laterality: N/A;   COLONOSCOPY  ~ 2014   done by GI in [place]. Polyps were removed. Patient not aware what type of polyps these were   DIRECT LARYNGOSCOPY N/A 01/28/2024   Procedure: LARYNGOSCOPY, DIRECT WITH BIOPSIES;  Surgeon: Tobie Eldora NOVAK, MD;  Location: [facility] OR;  Service: ENT;  Laterality: N/A;   ESOPHAGOGASTRODUODENOSCOPY  early 1990s   MD found ulcer   INGUINAL HERNIA REPAIR Left ~ 2010   with Mesh.    IR GASTROSTOMY TUBE MOD SED  03/13/2024   IR GASTROSTOMY TUBE MOD SED  04/03/2024   IR GASTROSTOMY TUBE REMOVAL  04/02/2024   VASECTOMY     Patient Active Problem List   Diagnosis Date Noted   Malnutrition of moderate degree 03/19/2024   Lower GI bleed 03/18/2024   Squamous cell carcinoma of left tonsil (HCC) 02/06/2024   Lingual tonsil hypertrophy 01/28/2024   Biliary colic 10/11/2018   Gallstones 10/11/2018   Hypokalemia 05/01/2017   Elevated LFTs 05/01/2017   Elevated lipase 05/01/2017   Chest pain 04/29/2017   Fever, unspecified 04/29/2017   Tachycardia 04/29/2017   Hyperglycemia 04/29/2017    ONSET DATE: June 2025    REFERRING DIAG: SCC of lt tonsil  THERAPY DIAG:  Dysphagia, unspecified type  Rationale for Evaluation and Treatment: Rehabilitation  SUBJECTIVE:   SUBJECTIVE STATEMENT: PEG dependent but trying foods and liquids   Pt accompanied by: self  PERTINENT HISTORY:  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 He presented to the ED on 01/03/24 with c/o headaches and an enlarged lymph node to his left neck. An Ultrasound was performed at that time showing a left submandibular mass suspicious for a necrotic lymph node. He then underwent a CT soft tissue neck to further investigate his symptoms. Scans showed a necrotic 2.6 cm left level II mass is suspicious a conglomerate nodal metastasis. CT chest and head were negative for any abnormalities. 01/04/24 Consult with [name]. 01/14/24 Core biopsy of left neck showing SCC p 16 +. 01/17/24 Direct Laryngoscope with [name] which revealed no obvious abnormalities or signs of primary disease. 01/25/24 PET re-demonstrated the necrotic left-sided level 2 lymph node measuring 3 cm and symmetric upper pharyngeal uptake extending to the left side of the vallecular. No additional areas of abnormal radiotracer uptake at this time to suggest additional areas of disease. 01/28/24 Tonsil biopsy showing SCC. 01/29/24 Radiation consult and 02/06/24 Medical oncology consult. He will  receive radiation alone.  Treatment plan:  He will receive 35 fractions of radiation to his left tonsil and bilateral neck which started on 02/20/24 and will complete 04/09/24. Pretreatment procedures: None  PAIN:  Are you having pain? Yes: NPRS scale: 2/10 Pain location: L throat Pain description: sore Aggravating factors: dryness Relieving factors: drinking - keeping hydrated in throat  FALLS: Has patient fallen in last 6 months?  See PT evaluation for details   PATIENT GOALS: maintain WNL swallowing  OBJECTIVE:  Note: Objective measures were  completed at Evaluation unless otherwise noted. DIAGNOSTIC FINDINGS: see pertinent history above                                                                                                                            TREATMENT DATE:   04/29/24: Reports dysgeusia, has been eating fruit and drinking water. Occasional coughing if I'm not careful. Today pt ate applesauce and drank water - with 13 sips water pt had one coughing episode. No difficulty with applesauce.  With HEP pt req'd mod A usually, has not been performing HEP as directed. SLP had to provide rationale for HEP to pt. SLP told pt to perform chin tuck instead of Shaker due to pain from PEG site. SLP educated pt about food journal and pt told benefit back to SLP.   03/06/24: Research states the risk for dysphagia increases due to radiation and/or chemotherapy treatment due to a variety of factors, so SLP educated the pt about the possibility of reduced/limited ability for PO intake during rad tx. SLP also educated pt regarding possible changes to swallowing musculature after rad tx, and why adherence to dysphagia HEP provided today and PO consumption was necessary to inhibit muscle fibrosis following rad tx and to mitigate muscle disuse atrophy. SLP informed pt why this would be detrimental to their swallowing status and to their pulmonary health. Pt demonstrated understanding of these things to SLP. SLP encouraged pt to safely eat and drink as deep into their radiation/chemotherapy as possible to provide the best possible long-term swallowing outcome for pt. SLP then developed an individualized HEP for pt involving oral and pharyngeal strengthening and ROM and pt was instructed how to perform these exercises, including SLP demonstration. After SLP demonstration, pt return demonstrated each exercise. SLP ensured pt performance was correct prior to educating pt on next exercise. Pt required occasional min cues faded to modified independent to  perform HEP. Pt was instructed to complete this program 6-7 days/week, at least 20 reps a day until 6 months after his or her last day of rad tx, and then x2 a week after that, indefinitely. Among other modifications for days when pt cannot functionally swallow, SLP also suggested pt to perform only non-swallowing tasks on the handout/HEP, and if necessary to cycle through the swallowing portion so the full program of exercises can be completed instead of fatiguing on one of the swallowing exercises and being unable to perform the other  swallowing exercises. SLP instructed that swallowing exercises should then be added back into the regimen as pt is able to do so.    PATIENT EDUCATION: Education details: late effects head/neck radiation on swallow function and HEP procedure Person educated: Patient Education method: Explanation, Demonstration, and Verbal cues Education comprehension: verbalized understanding, returned demonstration, verbal cues required, and needs further education   ASSESSMENT:  CLINICAL IMPRESSION: Patient is a 63 y.o. M who was seen today for treatment of swallowing after completion of RT. Today pt drank thin liquids with rare s/sx pharyngeal difficulty (cough once with 13 water boluses). No oropharyngeal difficulty noted with puree. At this time pt swallowing is deemed [facility] with these POs. There are no overt s/s aspiration PNA observed by SLP nor any reported by pt at this time. Data indicate that pt's swallow ability will likely decrease over the course of radiation/chemoradiation therapy and could very well decline over time following the conclusion of that therapy due to muscle disuse atrophy and/or muscle fibrosis. Pt will cont to need to be seen by SLP in order to assess safety of PO intake, assess the need for recommending any objective swallow assessment, and ensuring pt is correctly completing the individualized HEP.  OBJECTIVE IMPAIRMENTS: include dysphagia. These  impairments are limiting patient from safety when swallowing. Factors affecting potential to achieve goals and functional outcome are none noted today. Patient will benefit from skilled SLP services to address above impairments and improve overall function.   REHAB POTENTIAL: Good     GOALS: Goals reviewed with patient? No   SHORT TERM GOALS: Target: 3rd total session   1. Pt will complete HEP with modified independence in 2 sessions Baseline: Goal status: INITIAL   2.  pt will tell SLP why pt is completing HEP with modified independence Baseline:  Goal status: INITIAL   3.  pt will describe 3 overt s/s aspiration PNA with modified independence Baseline:  Goal status: INITIAL   4.  pt will tell SLP how a food journal could hasten return to a more normalized diet Baseline:  Goal status: met     LONG TERM GOALS: Target: 7th total session   1.  pt will complete HEP with independence over two visits Baseline:  Goal status: INITIAL   2.  pt will describe how to modify HEP over time, and the timeline associated with reduction in HEP frequency with modified independence over two sessions Baseline:  Goal status: INITIAL     PLAN:   SLP FREQUENCY:  once approx every 4 weeks   SLP DURATION:  7 sessions   PLANNED INTERVENTIONS: Aspiration precaution training, Pharyngeal strengthening exercises, Diet toleration management , Trials of upgraded texture/liquids, SLP instruction and feedback, Compensatory strategies, and Patient/family education, [phone] (treatment of swallowing dysfunction and/or oral function for feeding)    Devin Ganaway, CCC-SLP 04/29/2024, 11:06 AM

## 2024-04-29 NOTE — Therapy (Incomplete)
 OUTPATIENT PHYSICAL THERAPY HEAD AND NECK POST RADIATION FOLLOW UP   Patient Name: Douglas Hester MRN: [id number] DOB:May 05, 1961, 63 y.o., male Today's Date: 04/29/2024  END OF SESSION:   Past Medical History:  Diagnosis Date   Anxiety    Chronic GERD    Heart murmur    Hypertension    Tachycardia    Past Surgical History:  Procedure Laterality Date   CHOLECYSTECTOMY N/A 10/11/2018   Procedure: LAPAROSCOPIC CHOLECYSTECTOMY WITH INTRAOPERATIVE CHOLANGIOGRAM;  Surgeon: Curvin Deward MOULD, MD;  Location: [facility];  Service: General;  Laterality: N/A;   COLONOSCOPY  ~ 2014   done by GI in [place]. Polyps were removed. Patient not aware what type of polyps these were   DIRECT LARYNGOSCOPY N/A 01/28/2024   Procedure: LARYNGOSCOPY, DIRECT WITH BIOPSIES;  Surgeon: Tobie Eldora NOVAK, MD;  Location: [facility] OR;  Service: ENT;  Laterality: N/A;   ESOPHAGOGASTRODUODENOSCOPY  early 1990s   MD found ulcer   INGUINAL HERNIA REPAIR Left ~ 2010   with Mesh.    IR GASTROSTOMY TUBE MOD SED  03/13/2024   IR GASTROSTOMY TUBE MOD SED  04/03/2024   IR GASTROSTOMY TUBE REMOVAL  04/02/2024   VASECTOMY     Patient Active Problem List   Diagnosis Date Noted   Malnutrition of moderate degree 03/19/2024   Lower GI bleed 03/18/2024   Squamous cell carcinoma of left tonsil (HCC) 02/06/2024   Lingual tonsil hypertrophy 01/28/2024   Biliary colic 10/11/2018   Gallstones 10/11/2018   Hypokalemia 05/01/2017   Elevated LFTs 05/01/2017   Elevated lipase 05/01/2017   Chest pain 04/29/2017   Fever, unspecified 04/29/2017   Tachycardia 04/29/2017   Hyperglycemia 04/29/2017    PCP: Charmaine Bright, PA-C   REFERRING PROVIDER: Lauraine Golden, MD   REFERRING DIAG: C09.9 (ICD-10-CM) - Squamous cell carcinoma of left tonsil (HCC)   THERAPY DIAG:  No diagnosis found.  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 An Ultrasound was performed at that time showing a left submandibular mass suspicious for a necrotic lymph node. He then underwent a CT soft tissue neck to further investigate his symptoms.  Scans showed a necrotic 2.6 cm left level II mass is suspicious a conglomerate nodal metastasis. CT chest and head were negative for any abnormalities. 01/14/24 Core biopsy of left neck showing SCC p 16 +. 01/17/24 Direct Laryngoscope with [name] which revealed no obvious abnormalities or signs of primary disease.  01/25/24 PET re-demonstrated the necrotic left-sided level 2 lymph node measuring 3 cm and symmetric upper pharyngeal uptake extending to the left side of the vallecular. No additional areas of abnormal radiotracer uptake at this time to suggest additional areas of disease. 01/28/24 Tonsil biopsy showing SCC. He will receive 35 fractions of radiation to his left tonsil and bilateral neck which started on 02/20/24 and will complete 04/09/24.  PATIENT GOALS:  Reassess how my recovery is going related to neck ROM, cervical pain, fatigue, and swelling.  PAIN:  Are you having pain? {yes/no:20286} NPRS scale: ***/10 Pain location: *** Pain orientation: {Pain Orientation:25161}  PAIN TYPE: {type:313116} Pain description: {PAIN DESCRIPTION:21022940}  Aggravating factors: *** Relieving factors: ***  PRECAUTIONS: Recent radiation, Head and neck lymphedema risk,    OBJECTIVE:   POSTURE:  Forward head and rounded shoulders posture  30 SEC SIT TO STAND: 13 reps in 30 sec without use of UEs which is  Below average for patient's age   SHOULDER AROM:   WFL  but has a history of R shoulder injury, tore rotator cuff a few years ago that has healed but does not have full R shoulder ROM but functional     CERVICAL AROM: pt reports he thinks he has pinched nerves, his arms tingly, popping/grinding, was getting steroid shots but stopped, has been this way for a long time     Percent limited  Flexion WFL  Extension 50% limited  Right lateral flexion 75% limited  Left lateral flexion 75% limited  Right rotation 25% limited  Left rotation 25% limited                          (Blank rows=not tested)      LYMPHEDEMA ASSESSMENT:    Circumference in cm  4 cm superior to sternal notch around neck   6 cm superior to sternal notch around neck   8 cm superior to sternal notch around neck   R lateral nostril from base of nose to medial tragus   L lateral nostril from base of nose to medial tragus   R corner of mouth to where ear lobe meets face   L corner of mouth to where ear lobe meets face         (Blank rows=not tested)  CURRENT/PAST TREATMENTS:  Surgery type/date: ***  Chemotherapy: ***  Radiation:***  OTHER SYMPTOMS: Pain {yes/no:20286} Fibrosis {yes/no:20286} Pitting edema {yes/no:20286} Infections {yes/no:20286} Decreased scar mobility {yes/no:20286}  PATIENT EDUCATION:  Education details: *** Person educated: {Person educated:25204} Education method: {Education Method:25205} Education comprehension: {Education Comprehension:25206}  HOME EXERCISE PROGRAM: Reviewed previously given post op HEP. ***  ASSESSMENT:  CLINICAL IMPRESSION: ***  Pt will benefit from skilled therapeutic intervention to improve on the following deficits: {opptimpairments:25111}  PT treatment/interventions: ADL/Self care home management, {rehab planned interventions:25118::97110-Therapeutic exercises,97530- Therapeutic [phone]- Neuromuscular re-education,97535- Self Rjmz,02859- Manual therapy,Patient/Family education}      GOALS: Goals reviewed with patient? {yes/no:20286}  LONG TERM GOALS:  (STG=LTG)  GOALS Name Target Date  Goal status  1 Pt will demonstrate a return to baseline cervical ROM measurements and not demonstrate any signs or symptoms of lymphedema. Baseline: *** {GOALSTATUS:25110}  2     3     4           PLAN:  PT FREQUENCY/DURATION: ***  PLAN FOR NEXT SESSION: ***   [facility]  [address], [address]  [place] [address]  [phone]   Scar massage You can begin gentle scar massage to you incision sites. Gently place one hand on the incision and move the skin (without sliding on the skin) in various directions. Do this for a few minutes and then you can gently massage either coconut oil or vitamin E cream into the scars.  Home exercise Program Continue doing the exercises you were given until you feel like you can do them without feeling any tightness at the end. It is best to do them for several months after completion of radiation since the effects of radiation continue past completion.   Walking Program Studies show that 30 minutes of walking per day (fast enough to elevate your heart rate) can significantly reduce the risk of a cancer recurrence. If you can't walk due to other medical reasons, we encourage you to find another activity you could do (like a stationary bike or water exercise).  Posture After treatment for head and neck cancer, people frequently sit with rounded shoulders and forward head posture because the front of the neck has become tight and it feels better. If you sit like this, you can become very tight and have pain in sitting or standing with good posture. Try to be aware of your posture and sit and stand up tall to heal properly.  Follow up PT: Please let you doctor know as soon as possible if you develop any swelling in your face or neck in the future. Lymphedema (swelling) can occur months after completion of  radiation. The sooner you can let the doctor know, the sooner they can refer you back to PT. It is much easier to treat the swelling early on.   [place] Day Leando, PT 04/29/2024, 11:29 AM

## 2024-04-30 ENCOUNTER — Ambulatory Visit: Payer: Self-pay | Admitting: Physical Therapy

## 2024-05-02 NOTE — Progress Notes (Signed)
 Radiation Oncology         [phone]) [phone] ________________________________  Name: Douglas Hester MRN: [id number]  Date: 05/06/2024  DOB: 1961/05/19  Follow-Up Visit Note  CC: Katina Pfeiffer, PA-C  Katina Pfeiffer, PA-C  Diagnosis and Prior Radiotherapy:    [phone]   ICD-10-CM   1. Squamous cell carcinoma of head and neck  C44.42 NM PET Image Restag (PS) Skull Base To Thigh    2. Squamous cell carcinoma of left tonsil (HCC)  C09.9 NM PET Image Restag (PS) Skull Base To Thigh       ==========DELIVERED PLANS==========  First Treatment Date: 2024-02-20 Last Treatment Date: 2024-04-15   Plan Name: [id number] Site: Tonsil, Left Technique: IMRT Mode: Photon Dose Per Fraction: 2 Gy Prescribed Dose (Delivered / Prescribed): 70 Gy / 70 Gy Prescribed Fxs (Delivered / Prescribed): 35 / 35   Cancer Staging  Squamous cell carcinoma of left tonsil (HCC) Staging form: Pharynx - HPV-Mediated Oropharynx, AJCC 8th Edition - Clinical: Stage I (cT1, cN1, cM0, p16+) - Signed by Autumn Millman, MD on 02/06/2024  Stage 1 (cT1, N1, 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 He completed his treatment on 04/15/2024.         Pain issues, if any: He reports having pain around peg tube site. Notes that clip is still in place.  Using a feeding tube?:  G-tube replaced on 9/4. States he is tolerating feedings well.  Weight changes, if any:     Wt Readings from Last 4 Encounters: 05/06/24           188.0 lb  03/30/24 195 lb 15.8 oz (88.9 kg)  03/26/24 196 lb (88.9 kg)  03/20/24 203 lb 11.3 oz (92.4 kg)  Swallowing issues, if any: No issues.  Patient is tolerating a wide variety of food.  He recently ate mac & cheese and a frozen dinner without difficulty.  Smoking or chewing tobacco? Denies Using fluoride  toothpaste daily? Yes Last ENT visit was on:  Other notable issues, if any: Patients says he gets clammy and feels weak after a feeding for approximately 20 minutes.        ALLERGIES:  has no known allergies.  Meds: Current Outpatient Medications  Medication Sig Dispense Refill   acetaminophen  (TYLENOL ) 500 MG tablet Take 500-1,000 mg by mouth 2 (two) times daily as needed (pain.).     buPROPion  (WELLBUTRIN  XL) 150 MG 24 hr tablet Take 1 tablet (150 mg total) by mouth daily. 90 tablet 0   clonazePAM (KLONOPIN) 0.5 MG tablet Take 1 tablet as needed for severe anxiety/panic attacks. 15 tablet 0   fluconazole  (DIFLUCAN ) 10 MG/ML suspension Take 20mL today, then 10mL daily for 20 days. Ok to take by PEG tube. 220 mL 0   lamoTRIgine  (LAMICTAL ) 150 MG tablet Take 1 tablet (150 mg total) by mouth daily. 30 tablet 2   Melatonin 5 MG TABS Take 5 mg by mouth at bedtime as needed (sleep).     metoCLOPramide  (REGLAN ) 5 MG/5ML solution Take 10 mLs (10 mg total) by mouth 4 (four) times daily -  before meals and at bedtime. Ok to take by tube. If you have 4 meals a day, do not take at bedtime. 960 mL 2   mirtazapine  (REMERON ) 15 MG tablet Take 1 tablet (15 mg total) by mouth at bedtime. 90  tablet 0   Nutritional Supplements (NUTREN 1.5) LIQD 7 cartons Nutren 1.5/equivalent split over 4 feedings Flush tube with 60 ml water before and after each bolus Give additional 3.5 cups water (830 ml water) via PEG/po to meet hydration needs  Provides 2625 kcal, 119 g, 1337 ml free water (2647 ml total water). 1750 ml/day meets >95% est needs, 100% DRI     oxyCODONE  (ROXICODONE ) 5 MG/5ML solution Take 5-10 mLs (5-10 mg total) by mouth every 3 (three) hours as needed for severe pain (pain score 7-10). Take w/ food. 450 mL 0   polyethylene glycol (MIRALAX  / GLYCOLAX ) 17 g packet Take 17 g by mouth 2 (two) times daily. 14 each 0   promethazine  (PHENERGAN ) 25 MG tablet Take 1 tablet (25 mg total) by mouth every 6 (six) hours  as needed for nausea or vomiting. 30 tablet 2   lidocaine  (XYLOCAINE ) 2 % solution Use as directed 15 mLs in the mouth or throat as needed for mouth pain. Mix 1 part 2%viscous lidocaine ,1part H2O.Swish and/or swallow 10ml of this mixture,27min before meals and at bedtime, up to QID (Patient not taking: Reported on 05/06/2024) 100 mL 5   LORazepam  (ATIVAN ) 0.5 MG tablet Take 1 tablet (0.5 mg total) by mouth as needed for anxiety. Take 1 tablet 20 minutes prior to radiation treatment. Please have a driver given sedative effects. (Patient not taking: Reported on 05/06/2024) 35 tablet 0   metoprolol  succinate (TOPROL -XL) 25 MG 24 hr tablet Take 25 mg by mouth every evening. (Patient not taking: Reported on 05/06/2024)     No current facility-administered medications for this encounter.    Physical Findings: The patient is in no acute distress. Patient is alert and oriented. Wt Readings from Last 3 Encounters:  05/06/24 188 lb (85.3 kg)  03/30/24 195 lb 15.8 oz (88.9 kg)  03/26/24 196 lb (88.9 kg)    height is 5' 9 (1.753 m) and weight is 188 lb (85.3 kg). His temperature is 97.5 F (36.4 C) (abnormal). His blood pressure is 114/94 (abnormal) and his pulse is 110 (abnormal). His respiration is 19 and oxygen saturation is 98%. .  General: Alert and oriented, in no acute distress HEENT: Head is normocephalic. Extraocular movements are intact. Oropharynx is notable for uvula midline. No concerning lesions. Good dentition.  Neck: Neck is notable for no palpable adenopathy.  Skin: Skin in treatment fields shows hyperpigmentation and mild dryness, consistent with satisfactory healing. Heart: Regular in rate and rhythm with no murmurs, rubs, or gallops. Chest: Clear to auscultation bilaterally, with no rhonchi, wheezes, or rales. Abdomen: Soft, nontender, nondistended, with no rigidity or guarding. PEG tube in place, with cap holding PEG tube in place.  Extremities: No cyanosis or edema. Lymphatics: see  Neck Exam Psychiatric: Judgment and insight are intact. Affect is appropriate.   Lab Findings: Lab Results  Component Value Date   WBC 5.3 04/10/2024   HGB 13.8 04/10/2024   HCT 41.8 04/10/2024   MCV 88.4 04/10/2024   PLT 211 04/10/2024    Lab Results  Component Value Date   TSH 1.110 02/06/2024    Radiographic Findings: No results found.   Impression/Plan:  Stage 1 (cT1, N1, M0) squamous cell carcinoma of the left tonsil; s/p definitive radiation completed on 04/15/2024   1) Head and Neck Cancer Status: Patient is healing well from the effects of his radiation treatment.  2) Nutritional Status: Patient has lost weight. Encouraged patient to  [facility] Readings from Last 3 Encounters:  05/06/24 188 lb (85.3 kg)  03/30/24 195 lb 15.8 oz (88.9 kg)  03/26/24 196 lb (88.9 kg)  PEG tube: Intact. Button was in place and causing irritation, so button was removed today.   3) Risk Factors: The patient has been educated about risk factors including alcohol and tobacco abuse; they understand that avoidance of alcohol and tobacco is important to prevent recurrences as well as other cancers. Patient is not smoking.   4) Swallowing: Functional. He scheduled to see Lupita on 05/30/2024  5) Dental: Encouraged to continue regular followup with dentistry, and dental hygiene including fluoride rinses. Using fluoride toothpaste daily.   6) Thyroid  function: checking annually Lab Results  Component Value Date   TSH 1.110 02/06/2024   7) Other: Patient is experiencing vasovagal symptoms with tube feedings. We have reached out to nutrition to help manage this. ***  8) Tachycardia: Patient's pulse is 104 bpm today. He was instructed to stop taking his Metoprolol  due to low blood pressure. Patient is going to take his BP at home, and if his pulse stays consistently above 100 he will call us  to consider adding Metoprolol . He denies chest pain or shortness of breath.   8) Follow-up at the end of  December. PET prior to this appointment. The patient was encouraged to call with any issues or questions before then.   On date of service, in total, I spent 20 minutes on this encounter. Patient was seen in person. _____________________________________    Leeroy Due, PA-C

## 2024-05-05 ENCOUNTER — Telehealth (INDEPENDENT_AMBULATORY_CARE_PROVIDER_SITE_OTHER): Payer: Self-pay | Admitting: Otolaryngology

## 2024-05-05 NOTE — Telephone Encounter (Signed)
 Received Medical Records Request from [place]  [employer] regarding the patient's Short Term Disability Claim that is being reviewed.  Spoke with Chauncey at [facility] and he confirmed with the claim manager that even though Dr. Loni completed the Short Term Disability Paperwork for the patient, Dr. Anthony notes are needed.  Chauncey also stated that [name] will need to complete the Medical Request Form.  I have given the form to Dr. Anthony MA, [place]ef[place].

## 2024-05-05 NOTE — Progress Notes (Signed)
 Patient identity verified x2   Mr. Douglas Hester returns today for routine follow-up. He completed his treatment on 04/15/2024.   Treatment Completion Date: *** Pain issues, if any: *** Using a feeding tube?: *** G-tube replaced on 9/4. States he is tolerating feedings well.  Weight changes, if any: *** Wt Readings from Last 4 Encounters: 05/06/24  03/30/24 195 lb 15.8 oz (88.9 kg)  03/26/24 196 lb (88.9 kg)  03/20/24 203 lb 11.3 oz (92.4 kg)   Swallowing issues, if any: *** Smoking or chewing tobacco? *** Using fluoride toothpaste daily? *** Last ENT visit was on: *** Other notable issues, if any: ***

## 2024-05-06 ENCOUNTER — Encounter: Payer: Self-pay | Admitting: Radiology

## 2024-05-06 ENCOUNTER — Inpatient Hospital Stay: Attending: Radiation Oncology | Admitting: Dietician

## 2024-05-06 ENCOUNTER — Ambulatory Visit
Admission: RE | Admit: 2024-05-06 | Discharge: 2024-05-06 | Disposition: A | Discharge status: Home / Self Care | Source: Ambulatory Visit | Attending: Radiology | Admitting: Radiology

## 2024-05-06 VITALS — BP 114/94 | HR 104 | Temp 97.5°F | Resp 19 | Ht 69.0 in | Wt 188.0 lb

## 2024-05-06 DIAGNOSIS — R000 Tachycardia, unspecified: Secondary | ICD-10-CM | POA: Diagnosis not present

## 2024-05-06 DIAGNOSIS — Z931 Gastrostomy status: Secondary | ICD-10-CM | POA: Insufficient documentation

## 2024-05-06 DIAGNOSIS — C4442 Squamous cell carcinoma of skin of scalp and neck: Secondary | ICD-10-CM

## 2024-05-06 DIAGNOSIS — C024 Malignant neoplasm of lingual tonsil: Secondary | ICD-10-CM | POA: Diagnosis present

## 2024-05-06 DIAGNOSIS — Z85828 Personal history of other malignant neoplasm of skin: Secondary | ICD-10-CM | POA: Insufficient documentation

## 2024-05-06 DIAGNOSIS — Z79899 Other long term (current) drug therapy: Secondary | ICD-10-CM | POA: Insufficient documentation

## 2024-05-06 DIAGNOSIS — C099 Malignant neoplasm of tonsil, unspecified: Secondary | ICD-10-CM

## 2024-05-06 DIAGNOSIS — Z923 Personal history of irradiation: Secondary | ICD-10-CM | POA: Insufficient documentation

## 2024-05-06 NOTE — Progress Notes (Signed)
 Nutrition Follow-up:  Patient with SCC of oropharynx, p 16+.  He completed radiation therapy under the care of Dr Izell.     G-tube replaced on 9/4  Met with patient in radiation clinic for nutrition follow-up. Patient has starting eating some orally. Foods are tasting better, but not great. Patient tolerating soft moist foods (mac/cheese, salisbury steak/corn). He is drinking water by mouth frequently through out the day secondary to dry mouth. Patient continues to give 3 1/2 cartons KF 1.5 via tube. He flushes with 60 ml before and after bolus. Patient reports feeling weak/shaky for 20-30 minutes after eating (TF + PO). He is taking reglan  prior to intake. Patient denies nausea, vomiting, diarrhea, constipation.   Medications: reviewed   Labs: no new labs for review   Anthropometrics: Wt 188 lb today - decreased 3.6% in 3 weeks (severe for time frame)  9/15 - 195 lb  9/2 - 200.2 lb 8/31 - 195 lb  8/25 - 203.4 lb    NUTRITION DIAGNOSIS: Inadequate oral intake -  improving, continue supplementing with TF   INTERVENTION:  Continue po trials - encourage soft moist textures  Continue 3 cartons KF 1.4 - recommend po first + bolus last feeding ~2 hr before bed to encourage po at dinner meal (samples provided) Defer to PA/MD for reported weakness/shaky feeling after intake - r/t reglan ? Will continue working with patient to wean from tube    MONITORING, EVALUATION, GOAL: wt trends, intake, TF   NEXT VISIT: Tuesday October 28 via telephone

## 2024-05-08 ENCOUNTER — Ambulatory Visit

## 2024-05-10 ENCOUNTER — Emergency Department (HOSPITAL_BASED_OUTPATIENT_CLINIC_OR_DEPARTMENT_OTHER): Admission: EM | Admit: 2024-05-10 | Discharge: 2024-05-10 | Disposition: A | Discharge status: Home / Self Care

## 2024-05-10 ENCOUNTER — Encounter (HOSPITAL_BASED_OUTPATIENT_CLINIC_OR_DEPARTMENT_OTHER): Payer: Self-pay | Admitting: Emergency Medicine

## 2024-05-10 ENCOUNTER — Other Ambulatory Visit: Payer: Self-pay

## 2024-05-10 DIAGNOSIS — I10 Essential (primary) hypertension: Secondary | ICD-10-CM | POA: Diagnosis present

## 2024-05-10 DIAGNOSIS — E860 Dehydration: Secondary | ICD-10-CM | POA: Diagnosis not present

## 2024-05-10 LAB — COMPREHENSIVE METABOLIC PANEL WITH GFR
ALT: 19 U/L (ref 0–44)
AST: 18 U/L (ref 15–41)
Albumin: 4.2 g/dL (ref 3.5–5.0)
Alkaline Phosphatase: 73 U/L (ref 38–126)
Anion gap: 10 (ref 5–15)
BUN: 12 mg/dL (ref 8–23)
CO2: 26 mmol/L (ref 22–32)
Calcium: 9.3 mg/dL (ref 8.9–10.3)
Chloride: 103 mmol/L (ref 98–111)
Creatinine, Ser: 0.85 mg/dL (ref 0.61–1.24)
GFR, Estimated: >60 mL/min (ref >60)
Glucose, Bld: 118 mg/dL — ABNORMAL HIGH (ref 70–99)
Potassium: 3.8 mmol/L (ref 3.5–5.1)
Sodium: 139 mmol/L (ref 135–145)
Total Bilirubin: 0.3 mg/dL (ref 0.0–1.2)
Total Protein: 6.7 g/dL (ref 6.5–8.1)

## 2024-05-10 LAB — CBC WITH DIFFERENTIAL/PLATELET
Abs Immature Granulocytes: 0.01 K/uL (ref 0.00–0.07)
Basophils Absolute: 0 K/uL (ref 0.0–0.1)
Basophils Relative: 0 %
Eosinophils Absolute: 0.2 K/uL (ref 0.0–0.5)
Eosinophils Relative: 3 %
HCT: 40.4 % (ref 39.0–52.0)
Hemoglobin: 13.4 g/dL (ref 13.0–17.0)
Immature Granulocytes: 0 %
Lymphocytes Relative: 9 %
Lymphs Abs: 0.4 K/uL — ABNORMAL LOW (ref 0.7–4.0)
MCH: 29.1 pg (ref 26.0–34.0)
MCHC: 33.2 g/dL (ref 30.0–36.0)
MCV: 87.8 fL (ref 80.0–100.0)
Monocytes Absolute: 0.5 K/uL (ref 0.1–1.0)
Monocytes Relative: 11 %
Neutro Abs: 3.8 K/uL (ref 1.7–7.7)
Neutrophils Relative %: 77 %
Platelets: 224 K/uL (ref 150–400)
RBC: 4.6 MIL/uL (ref 4.22–5.81)
RDW: 12.6 % (ref 11.5–15.5)
WBC: 4.9 K/uL (ref 4.0–10.5)
nRBC: 0 % (ref 0.0–0.2)

## 2024-05-10 LAB — LIPASE, BLOOD: Lipase: 24 U/L (ref 11–51)

## 2024-05-10 MED ORDER — LACTATED RINGERS IV BOLUS
1000.0000 mL | Freq: Once | INTRAVENOUS | Status: AC
  Administered 2024-05-10: 1000 mL via INTRAVENOUS

## 2024-05-10 NOTE — ED Triage Notes (Addendum)
 Hx of cancer throat ,  and recent;y had diarrhea and increased  heart rate the last week  and  was on BP meds  but off since he lost weight now feels his pressure is going back up has been keeping  his numbers

## 2024-05-10 NOTE — ED Provider Notes (Signed)
 [place] [facility] Provider Note   CSN: [id number] Arrival date & time: 05/10/24  9152     Patient presents with: Diarrhea   Douglas Hester is a 63 y.o. male with history of squamous cell carcinoma of the left tonsil, bipolar 1 disorder, hypertension, presents with concern for having some elevated blood pressure readings and increased heart rate over the past week.  Reports that when he is taking his blood pressure at home, the systolic will range from 130-160 systolic which is not typical for him.  He also reports his heart rate seems to be increased to the 90-110 range.  Reports he used to have problems with his blood pressure and fast heart rate, but since having radiation for his throat cancer, he lost some weight and no longer needed to be on his losartan and metoprolol .  He reports some occasional headaches that seem to come on with the elevated blood pressure.  He also reports 3 episodes of nonbloody diarrhea that began yesterday. He also just started transitioning from tube feeds to oral foods a couple days ago. He denies any recent surgeries, hospitalizations, any recent antibiotic use.  Denies any fevers,cough, abdominal pain, or vomiting.  Does report some nausea.  Denies any chest pain or shortness of breath.     Diarrhea      Prior to Admission medications   Medication Sig Start Date End Date Taking? Authorizing Provider  acetaminophen  (TYLENOL ) 500 MG tablet Take 500-1,000 mg by mouth 2 (two) times daily as needed (pain.).    [provider]  buPROPion  (WELLBUTRIN  XL) 150 MG 24 hr tablet Take 1 tablet (150 mg total) by mouth daily. 04/21/24   Arfeen, Leni DASEN, MD  clonazePAM (KLONOPIN) 0.5 MG tablet Take 1 tablet as needed for severe anxiety/panic attacks. 04/28/24   Arfeen, Leni DASEN, MD  fluconazole  (DIFLUCAN ) 10 MG/ML suspension Take 20mL today, then 10mL daily for 20 days. Ok to take by PEG tube. 03/24/24   Izell Domino, MD   lamoTRIgine  (LAMICTAL ) 150 MG tablet Take 1 tablet (150 mg total) by mouth daily. 04/21/24   Arfeen, Leni DASEN, MD  lidocaine  (XYLOCAINE ) 2 % solution Use as directed 15 mLs in the mouth or throat as needed for mouth pain. Mix 1 part 2%viscous lidocaine ,1part H2O.Swish and/or swallow 10ml of this mixture,65min before meals and at bedtime, up to QID Patient not taking: Reported on 05/06/2024 03/05/24   Wyatt Leeroy HERO, PA-C  LORazepam  (ATIVAN ) 0.5 MG tablet Take 1 tablet (0.5 mg total) by mouth as needed for anxiety. Take 1 tablet 20 minutes prior to radiation treatment. Please have a driver given sedative effects. Patient not taking: Reported on 05/06/2024 02/13/24   Wyatt Leeroy HERO, PA-C  Melatonin 5 MG TABS Take 5 mg by mouth at bedtime as needed (sleep).    [provider]  metoCLOPramide  (REGLAN ) 5 MG/5ML solution Take 10 mLs (10 mg total) by mouth 4 (four) times daily -  before meals and at bedtime. Ok to take by tube. If you have 4 meals a day, do not take at bedtime. 04/01/24   Izell Domino, MD  metoprolol  succinate (TOPROL -XL) 25 MG 24 hr tablet Take 25 mg by mouth every evening. Patient not taking: Reported on 05/06/2024 09/18/22   [provider]  mirtazapine  (REMERON ) 15 MG tablet Take 1 tablet (15 mg total) by mouth at bedtime. 04/21/24 07/20/24  Curry Leni DASEN, MD  Nutritional Supplements (NUTREN 1.5) LIQD 7 cartons Nutren 1.5/equivalent  split over 4 feedings Flush tube with 60 ml water before and after each bolus Give additional 3.5 cups water (830 ml water) via PEG/po to meet hydration needs  Provides 2625 kcal, 119 g, 1337 ml free water (2647 ml total water). 1750 ml/day meets >95% est needs, 100% DRI 03/14/24   Izell Domino, MD  oxyCODONE  (ROXICODONE ) 5 MG/5ML solution Take 5-10 mLs (5-10 mg total) by mouth every 3 (three) hours as needed for severe pain (pain score 7-10). Take w/ food. 04/07/24   Izell Domino, MD  polyethylene glycol (MIRALAX  / GLYCOLAX ) 17 g packet Take 17 g by  mouth 2 (two) times daily. 03/20/24   Regalado, Owen A, MD  promethazine  (PHENERGAN ) 25 MG tablet Take 1 tablet (25 mg total) by mouth every 6 (six) hours as needed for nausea or vomiting. 03/17/24   Izell Domino, MD    Allergies: Patient has no known allergies.    Review of Systems  Gastrointestinal:  Positive for diarrhea.    Updated Vital Signs BP (!) 137/90   Pulse 91   Temp 98.5 F (36.9 C) (Oral)   Resp (!) 24   Ht 5' 9 (1.753 m)   Wt 85.3 kg   SpO2 100%   BMI 27.77 kg/m   Physical Exam Vitals and nursing note reviewed.  Constitutional:      General: He is not in acute distress.    Appearance: He is well-developed.  HENT:     Head: Normocephalic and atraumatic.  Eyes:     Conjunctiva/sclera: Conjunctivae normal.  Cardiovascular:     Rate and Rhythm: Normal rate and regular rhythm.     Heart sounds: No murmur heard. Pulmonary:     Effort: Pulmonary effort is normal. No respiratory distress.     Breath sounds: Normal breath sounds.  Abdominal:     Palpations: Abdomen is soft.     Tenderness: There is no abdominal tenderness.     Comments: G tube in place without surrounding erythema or drainage  Musculoskeletal:        General: No swelling.     Cervical back: Neck supple.  Skin:    General: Skin is warm and dry.     Capillary Refill: Capillary refill takes 2 to 3 seconds.  Neurological:     Mental Status: He is alert.  Psychiatric:        Mood and Affect: Mood normal.     (all labs ordered are listed, but only abnormal results are displayed) Labs Reviewed  CBC WITH DIFFERENTIAL/PLATELET - Abnormal; Notable for the following components:      Result Value   Lymphs Abs 0.4 (*)    All other components within normal limits  COMPREHENSIVE METABOLIC PANEL WITH GFR - Abnormal; Notable for the following components:   Glucose, Bld 118 (*)    All other components within normal limits  LIPASE, BLOOD    EKG: EKG Interpretation Date/Time:  Saturday May 10 2024 08:58:37 EDT Ventricular Rate:  95 PR Interval:  163 QRS Duration:  144 QT Interval:  382 QTC Calculation: 481 R Axis:   -85  Text Interpretation: Sinus rhythm RBBB and LAFB Confirmed by Neysa Clap [id number]) on 05/10/2024 9:19:53 AM  Radiology: No results found.   Procedures   Medications Ordered in the ED  lactated ringers  bolus 1,000 mL (1,000 mLs Intravenous New Bag/Given 05/10/24 0914)  Medical Decision Making Amount and/or Complexity of Data Reviewed Labs: ordered.     Differential diagnosis includes but is not limited to dehydration, electrolyte abnormality, gastroenteritis, colitis, diverticulitis, hypertension  ED Course:  Upon initial evaluation, patient is well-appearing, no acute distress.  His blood pressure upon arrival is 120/94.  Heart rate 99, borderline tachycardic.  No fever.  His cap refill is about 2-3 seconds, he seems slightly dehydrated which may be contributing to his mild tachycardia. Will give LR bolus. No abdominal tenderness to palpation.   Labs Ordered: I Ordered, and personally interpreted labs.  The pertinent results include:   CBC within normal limits CMP with mildly elevated glucose at 119.  Otherwise, normal electrolytes.  No elevations in LFTs or creatinine Lipase within normal limits  Cardiac Monitoring: / EKG: The patient was maintained on a cardiac monitor.  I personally viewed and interpreted the cardiac monitored which showed an underlying rhythm of: Normal sinus rhythm   Medications Given: LR bolus  Upon re-evaluation, patient remains well appearing with stable vitals. HR remains without tachycardia at 92. Blood pressure only with mild elevation at 137/90 upon repeat.  Patient's labs are very reassuring.  CBC is without leukocytosis, no tachycardia or fevers, I doubt systemic infection.  CMP without any elevations in LFTs, creatinine.  Normal electrolytes.  Lipase within normal limits.   His abdomen is soft and nontender to palpation, he denies any abdominal pain, low concern for acute intra-abdominal pathology such as colitis, diverticulitis.  He did report a couple episodes of nonbloody diarrhea yesterday and today, but no recent foreign travel, no recent hospitalization or, no recent antibiotic use, no fevers, doubt any infectious source such as C. difficile that would require antibiotic therapy.  He did just start introducing oral foods into his diet instead of tube feedings, question if this could be source of his diarrhea.  He did appear slightly dehydrated coming in, and seems to feel better with fluids.  He was concerned about blood pressures being more elevated than normal at home.  However, blood pressures here are only slightly elevated at 128/84 upon arrival, and 137/98 upon repeat.  We will have him follow-up with his PCP regarding his blood pressure management. I discussed this case with my attending Dr. Neysa who also personally evaluated patient.  He agrees with plan of care. Do not feel patient needs any additional imaging Patient stable and appropriate for discharge home    Impression: Dehydration Hypertension  Disposition:  The patient was discharged home with instructions to keep well-hydrated home with water.  Follow-up with his PCP within the next 2 weeks for further management of his blood pressures. Return precautions given.    Record Review: External records from outside source obtained and reviewed including oncology notes, CT abdomen pelvis from 03/30/2024     This chart was dictated using voice recognition software, Dragon. Despite the best efforts of this provider to proofread and correct errors, errors may still occur which can change documentation meaning.       Final diagnoses:  Dehydration  Hypertension, unspecified type    ED Discharge Orders     None          Veta Palma, PA-C 05/10/24 1017    Neysa Caron PARAS,  OHIO 05/10/24 1516

## 2024-05-10 NOTE — Discharge Instructions (Signed)
 You appeared slightly dehydrated today.  You were given some fluids today through your IV.  Dehydration can cause your heart rate to increase.  Please keep well-hydrated at home with water so that your urine is a light yellow color.  Your labs are reassuring.  Your blood counts, electrolytes, kidney, and liver, and pancreas tests were normal.  Your blood pressure was mildly elevated here today at 133/85. Aim to get 30 minutes of exercise daily and limit intake of processed/fast foods. Please take and record your blood pressures at home. Take them at the same time every day and make sure you have been sitting for at least 10 minutes before taking your blood pressure. Follow up with your PCP within the next month to discuss if you may need treatment for your blood pressure.  Please return to the ER for any unexplained fevers, severe abdominal pain, vomiting, any other new or concerning symptoms

## 2024-05-10 NOTE — ED Notes (Signed)
 ED Provider at bedside.

## 2024-05-13 ENCOUNTER — Telehealth (INDEPENDENT_AMBULATORY_CARE_PROVIDER_SITE_OTHER): Payer: Self-pay | Admitting: Otolaryngology

## 2024-05-13 NOTE — Telephone Encounter (Signed)
 [employer] sent another request for medical records for this patient Incident # [id number].  I called the [facility] HIM Dept and spoke with [facility].  They will process the request today and [employer] should receive an invoice by end of day today.  I called Praveen [phone]) at [employer] and relayed that information.

## 2024-05-14 ENCOUNTER — Other Ambulatory Visit: Payer: Self-pay

## 2024-05-14 DIAGNOSIS — C099 Malignant neoplasm of tonsil, unspecified: Secondary | ICD-10-CM

## 2024-05-17 ENCOUNTER — Other Ambulatory Visit: Payer: Self-pay

## 2024-05-17 ENCOUNTER — Encounter (HOSPITAL_BASED_OUTPATIENT_CLINIC_OR_DEPARTMENT_OTHER): Payer: Self-pay

## 2024-05-17 ENCOUNTER — Emergency Department (HOSPITAL_BASED_OUTPATIENT_CLINIC_OR_DEPARTMENT_OTHER)
Admission: EM | Admit: 2024-05-17 | Discharge: 2024-05-17 | Disposition: A | Discharge status: Home / Self Care | Attending: Emergency Medicine | Admitting: Emergency Medicine

## 2024-05-17 DIAGNOSIS — K5900 Constipation, unspecified: Secondary | ICD-10-CM | POA: Diagnosis present

## 2024-05-17 DIAGNOSIS — K5641 Fecal impaction: Secondary | ICD-10-CM

## 2024-05-17 LAB — COMPREHENSIVE METABOLIC PANEL WITH GFR
ALT: 20 U/L (ref 0–44)
AST: 18 U/L (ref 15–41)
Albumin: 4.4 g/dL (ref 3.5–5.0)
Alkaline Phosphatase: 76 U/L (ref 38–126)
Anion gap: 12 (ref 5–15)
BUN: 15 mg/dL (ref 8–23)
CO2: 26 mmol/L (ref 22–32)
Calcium: 9.6 mg/dL (ref 8.9–10.3)
Chloride: 97 mmol/L — ABNORMAL LOW (ref 98–111)
Creatinine, Ser: 0.97 mg/dL (ref 0.61–1.24)
GFR, Estimated: >60 mL/min (ref >60)
Glucose, Bld: 98 mg/dL (ref 70–99)
Potassium: 3.9 mmol/L (ref 3.5–5.1)
Sodium: 135 mmol/L (ref 135–145)
Total Bilirubin: 0.4 mg/dL (ref 0.0–1.2)
Total Protein: 7 g/dL (ref 6.5–8.1)

## 2024-05-17 LAB — CBC WITH DIFFERENTIAL/PLATELET
Abs Immature Granulocytes: 0.01 K/uL (ref 0.00–0.07)
Basophils Absolute: 0 K/uL (ref 0.0–0.1)
Basophils Relative: 1 %
Eosinophils Absolute: 0.1 K/uL (ref 0.0–0.5)
Eosinophils Relative: 2 %
HCT: 40 % (ref 39.0–52.0)
Hemoglobin: 13.2 g/dL (ref 13.0–17.0)
Immature Granulocytes: 0 %
Lymphocytes Relative: 9 %
Lymphs Abs: 0.5 K/uL — ABNORMAL LOW (ref 0.7–4.0)
MCH: 28.9 pg (ref 26.0–34.0)
MCHC: 33 g/dL (ref 30.0–36.0)
MCV: 87.5 fL (ref 80.0–100.0)
Monocytes Absolute: 0.6 K/uL (ref 0.1–1.0)
Monocytes Relative: 11 %
Neutro Abs: 4.6 K/uL (ref 1.7–7.7)
Neutrophils Relative %: 77 %
Platelets: 205 K/uL (ref 150–400)
RBC: 4.57 MIL/uL (ref 4.22–5.81)
RDW: 12.7 % (ref 11.5–15.5)
WBC: 5.9 K/uL (ref 4.0–10.5)
nRBC: 0 % (ref 0.0–0.2)

## 2024-05-17 LAB — LIPASE, BLOOD: Lipase: 29 U/L (ref 11–51)

## 2024-05-17 MED ORDER — SODIUM CHLORIDE 0.9 % IV BOLUS
1000.0000 mL | Freq: Once | INTRAVENOUS | Status: AC
  Administered 2024-05-17: 1000 mL via INTRAVENOUS

## 2024-05-17 NOTE — ED Triage Notes (Signed)
 Reports constipation, nausea and headache since Monday. Last BM 10/18. No relief from at home laxatives.  Denies abd pain, emesis

## 2024-05-17 NOTE — ED Provider Notes (Signed)
 [place] [facility] Provider Note   CSN: [id number] Arrival date & time: 05/17/24  1703     Patient presents with: Constipation   Douglas Hester is a 63 y.o. male.   The history is provided by the patient and medical records.  Constipation Douglas Hester is a 63 y.o. male who presents to the Emergency Department complaining of constipation. He presents the emergency department for evaluation of constipation. His last bowel movement was small-volume and on Monday. He has been taking numerous doses of MiraLAX  and Dulcolax without improvement. He reports rectal discomfort since yesterday. He also has nausea, headache. He is not having any abdominal pain, fever, vomiting. He does report trouble emptying his bladder at times. He does have a gastrostomy tube in place with plan to remove on October 30. He is status post radiation of the neck for squamous cell carcinoma. He does report decreased oral intake and appetite secondary to this.   Prior to Admission medications   Medication Sig Start Date End Date Taking? Authorizing Provider  acetaminophen  (TYLENOL ) 500 MG tablet Take 500-1,000 mg by mouth 2 (two) times daily as needed (pain.).    [provider]  buPROPion  (WELLBUTRIN  XL) 150 MG 24 hr tablet Take 1 tablet (150 mg total) by mouth daily. 04/21/24   Arfeen, Leni DASEN, MD  clonazePAM (KLONOPIN) 0.5 MG tablet Take 1 tablet as needed for severe anxiety/panic attacks. 04/28/24   Arfeen, Leni DASEN, MD  fluconazole  (DIFLUCAN ) 10 MG/ML suspension Take 20mL today, then 10mL daily for 20 days. Ok to take by PEG tube. 03/24/24   Izell Domino, MD  lamoTRIgine  (LAMICTAL ) 150 MG tablet Take 1 tablet (150 mg total) by mouth daily. 04/21/24   Arfeen, Leni DASEN, MD  lidocaine  (XYLOCAINE ) 2 % solution Use as directed 15 mLs in the mouth or throat as needed for mouth pain. Mix 1 part 2%viscous lidocaine ,1part H2O.Swish and/or swallow 10ml of this mixture,69min before  meals and at bedtime, up to QID Patient not taking: Reported on 05/06/2024 03/05/24   Wyatt Leeroy HERO, PA-C  LORazepam  (ATIVAN ) 0.5 MG tablet Take 1 tablet (0.5 mg total) by mouth as needed for anxiety. Take 1 tablet 20 minutes prior to radiation treatment. Please have a driver given sedative effects. Patient not taking: Reported on 05/06/2024 02/13/24   Wyatt Leeroy HERO, PA-C  Melatonin 5 MG TABS Take 5 mg by mouth at bedtime as needed (sleep).    [provider]  metoCLOPramide  (REGLAN ) 5 MG/5ML solution Take 10 mLs (10 mg total) by mouth 4 (four) times daily -  before meals and at bedtime. Ok to take by tube. If you have 4 meals a day, do not take at bedtime. 04/01/24   Izell Domino, MD  metoprolol  succinate (TOPROL -XL) 25 MG 24 hr tablet Take 25 mg by mouth every evening. Patient not taking: Reported on 05/06/2024 09/18/22   [provider]  mirtazapine  (REMERON ) 15 MG tablet Take 1 tablet (15 mg total) by mouth at bedtime. 04/21/24 07/20/24  Arfeen, Leni DASEN, MD  Nutritional Supplements (NUTREN 1.5) LIQD 7 cartons Nutren 1.5/equivalent split over 4 feedings Flush tube with 60 ml water before and after each bolus Give additional 3.5 cups water (830 ml water) via PEG/po to meet hydration needs  Provides 2625 kcal, 119 g, 1337 ml free water (2647 ml total water). 1750 ml/day meets >95% est needs, 100% DRI 03/14/24   Izell Domino, MD  oxyCODONE  (ROXICODONE ) 5 MG/5ML solution Take 5-10 mLs (  5-10 mg total) by mouth every 3 (three) hours as needed for severe pain (pain score 7-10). Take w/ food. 04/07/24   Izell Domino, MD  polyethylene glycol (MIRALAX  / GLYCOLAX ) 17 g packet Take 17 g by mouth 2 (two) times daily. 03/20/24   Regalado, Owen A, MD  promethazine  (PHENERGAN ) 25 MG tablet Take 1 tablet (25 mg total) by mouth every 6 (six) hours as needed for nausea or vomiting. 03/17/24   Izell Domino, MD    Allergies: Patient has no known allergies.    Review of Systems  Gastrointestinal:  Positive  for constipation.  All other systems reviewed and are negative.   Updated Vital Signs BP 113/78 (BP Location: Left Arm)   Pulse 100   Temp 98.3 F (36.8 C) (Oral)   Resp 18   SpO2 96%   Physical Exam Vitals and nursing note reviewed.  Constitutional:      Appearance: He is well-developed.  HENT:     Head: Normocephalic and atraumatic.  Cardiovascular:     Rate and Rhythm: Normal rate and regular rhythm.  Pulmonary:     Effort: Pulmonary effort is normal. No respiratory distress.  Abdominal:     Palpations: Abdomen is soft.     Tenderness: There is no abdominal tenderness. There is no guarding or rebound.     Comments: G-tube in the left upper quadrant. No significant erythema or edema surrounding the tube  Genitourinary:    Comments: There is stool in the rectal vault. No external hemorrhoids. Musculoskeletal:        General: No tenderness.  Skin:    General: Skin is warm and dry.  Neurological:     Mental Status: He is alert and oriented to person, place, and time.  Psychiatric:        Behavior: Behavior normal.     (all labs ordered are listed, but only abnormal results are displayed) Labs Reviewed  COMPREHENSIVE METABOLIC PANEL WITH GFR - Abnormal; Notable for the following components:      Result Value   Chloride 97 (*)    All other components within normal limits  CBC WITH DIFFERENTIAL/PLATELET - Abnormal; Notable for the following components:   Lymphs Abs 0.5 (*)    All other components within normal limits  LIPASE, BLOOD    EKG: None  Radiology: No results found.   .Fecal disimpaction  Date/Time: 05/17/2024 9:28 PM  Performed by: Griselda Norris, MD Authorized by: Griselda Norris, MD  Consent: Verbal consent obtained Consent given by: patient  Sedation: Patient sedated: no  Patient tolerance: patient tolerated the procedure well with no immediate complications      Medications Ordered in the ED  sodium chloride  0.9 % bolus 1,000 mL (0  mLs Intravenous Stopped 05/17/24 1855)                                    Medical Decision Making Amount and/or Complexity of Data Reviewed Labs: ordered.   Patient here for evaluation of constipation, has rectal discomfort. He does have a history of squamous cell carcinoma status post radiation with a gastrostomy tube in place. Labs are unremarkable. On initial assessment patient did have a fecal impaction, this was unable to be accessed digitally as it was too high to reach. He was treated with an enema with minimal return of stool but on repeat digital examination disinfection could be performed. After partial disinfection patient was  able to have a large successful bowel movement with significant improvement in his symptoms. No evidence of abscess, obstruction. No evidence of acute abdomen. Feel he can safely be discharged home with outpatient follow-up and return precautions.    Final diagnoses:  Constipation, unspecified constipation type  Fecal impaction in rectum [facility])    ED Discharge Orders     None          Griselda Norris, MD 05/17/24 2129

## 2024-05-17 NOTE — ED Notes (Signed)
Soap suds enema given at this time.

## 2024-05-27 ENCOUNTER — Telehealth: Payer: Self-pay | Admitting: Dietician

## 2024-05-27 ENCOUNTER — Inpatient Hospital Stay: Admitting: Dietician

## 2024-05-27 ENCOUNTER — Encounter: Admitting: Dietician

## 2024-05-27 NOTE — Telephone Encounter (Signed)
 Nutrition Follow-up:  Patient with SCC of oropharynx, p 16+.  He completed radiation therapy under the care of Dr Izell.     G-tube replaced on 9/4  Spoke with patient via telephone. He is doing okay. Expressed frustration on slow healing. Patient hoping to be further along at this point. He does acknowledge taste is improving. States most foods are neutral which is much better than tasting terrible. Patient has been drinking orange juice as this taste really good. He is eating 3 meals daily and is planning to pick up some Ensure to supplement intake if tolerated. This morning he had 2 scrambled eggs, yogurt, and juice. Sometimes he has oatmeal. Recalls broccoli cheddar soup from [employer] for lunch and dinner which also had a good taste.   Says he is drinking a ton of water. Surprised to learn he was dehydrated at recent ED visit where he presented with constipation. Patient reports constipation has resolved s/p IVF and enema. He is taking miralax  every other day.   Patient scheduled to have feeding tube removed tomorrow (10/29). He has not used tube other than daily FWF in the last 3 weeks. Patient is looking forward to having it out. Reports abdominal soreness/irritation around insertion site as well as frequent drainage. Patient is providing daily PEG care as instructed.    Medications: reviewed   Labs: 10/18 - reviewed   Anthropometrics: Last wt 188 lb 0.8 oz on 10/11  10/7 - 188 lb  8/31 - 195 lb 15.8 oz    NUTRITION DIAGNOSIS: Inadequate oral intake - improving    INTERVENTION:  Encourage continued po trials, support and encouragement offered as taste continues to improve Provide good oral care and continue baking soda salt water gargles Offered suggestions on ways to add calories and protein to foods (cheese to eggs, using milk vs water in oatmeal) Agree with daily ONS for added calories and protein - will leave samples + coupons at registration desk for p/u on 10/29 (pt aware)   Continue bowel regimen   MONITORING, EVALUATION, GOAL: wt trends, intake   NEXT VISIT: To be scheduled

## 2024-05-28 ENCOUNTER — Ambulatory Visit (HOSPITAL_COMMUNITY)
Admission: RE | Admit: 2024-05-28 | Discharge: 2024-05-28 | Disposition: A | Discharge status: Home / Self Care | Source: Ambulatory Visit | Attending: Radiation Oncology | Admitting: Radiation Oncology

## 2024-05-28 DIAGNOSIS — Z431 Encounter for attention to gastrostomy: Secondary | ICD-10-CM | POA: Insufficient documentation

## 2024-05-28 DIAGNOSIS — C760 Malignant neoplasm of head, face and neck: Secondary | ICD-10-CM | POA: Diagnosis not present

## 2024-05-28 DIAGNOSIS — C099 Malignant neoplasm of tonsil, unspecified: Secondary | ICD-10-CM

## 2024-05-28 HISTORY — PX: IR GASTROSTOMY TUBE REMOVAL: IMG5492

## 2024-05-28 NOTE — Procedures (Signed)
 Patient's 20 French balloon retention gastrostomy tube was removed in its entirety without immediate complications.  7 cc saline aspirated from balloon prior to removal.  Gauze dressing applied over site.  No immediate complications.  EBL none.

## 2024-05-30 ENCOUNTER — Ambulatory Visit: Attending: Radiation Oncology

## 2024-05-30 DIAGNOSIS — R1310 Dysphagia, unspecified: Secondary | ICD-10-CM | POA: Diagnosis present

## 2024-05-30 NOTE — Therapy (Signed)
 OUTPATIENT SPEECH LANGUAGE PATHOLOGY ONCOLOGY TREATMENT   Patient Name: Douglas Hester MRN: [id number] DOB:01/15/1961, 63 y.o., male Today's Date: 05/30/2024  PCP: Katina Pfeiffer, MD REFERRING PROVIDER: Montell Domino, MD  END OF SESSION:  End of Session - 05/30/24 0929     Visit Number 3    Number of Visits 7    Date for Recertification  08/28/24    SLP Start Time 0932    SLP Stop Time  0958    SLP Time Calculation (min) 26 min    Activity Tolerance Patient tolerated treatment well           Past Medical History:  Diagnosis Date   Anxiety    Chronic GERD    Heart murmur    Hypertension    Tachycardia    Past Surgical History:  Procedure Laterality Date   CHOLECYSTECTOMY N/A 10/11/2018   Procedure: LAPAROSCOPIC CHOLECYSTECTOMY WITH INTRAOPERATIVE CHOLANGIOGRAM;  Surgeon: Curvin Deward MOULD, MD;  Location: [facility];  Service: General;  Laterality: N/A;   COLONOSCOPY  ~ 2014   done by GI in [place]. Polyps were removed. Patient not aware what type of polyps these were   DIRECT LARYNGOSCOPY N/A 01/28/2024   Procedure: LARYNGOSCOPY, DIRECT WITH BIOPSIES;  Surgeon: Tobie Eldora NOVAK, MD;  Location: [facility] OR;  Service: ENT;  Laterality: N/A;   ESOPHAGOGASTRODUODENOSCOPY  early 1990s   MD found ulcer   INGUINAL HERNIA REPAIR Left ~ 2010   with Mesh.    IR GASTROSTOMY TUBE MOD SED  03/13/2024   IR GASTROSTOMY TUBE MOD SED  04/03/2024   IR GASTROSTOMY TUBE REMOVAL  04/02/2024   IR GASTROSTOMY TUBE REMOVAL  05/28/2024   VASECTOMY     Patient Active Problem List   Diagnosis Date Noted   Malnutrition of moderate degree 03/19/2024   Lower GI bleed 03/18/2024   Squamous cell carcinoma of left tonsil (HCC) 02/06/2024   Lingual tonsil hypertrophy 01/28/2024   Biliary colic 10/11/2018   Gallstones 10/11/2018   Hypokalemia 05/01/2017   Elevated LFTs 05/01/2017   Elevated lipase 05/01/2017   Chest pain 04/29/2017   Fever, unspecified 04/29/2017   Tachycardia 04/29/2017    Hyperglycemia 04/29/2017    ONSET DATE: June 2025   REFERRING DIAG: SCC of lt tonsil  THERAPY DIAG:  Dysphagia, unspecified type  Rationale for Evaluation and Treatment: Rehabilitation  SUBJECTIVE:   SUBJECTIVE STATEMENT: I hadn't used (the PEG) for 3-4 weeks.   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 He presented to the ED on 01/03/24 with c/o headaches and an enlarged lymph node to his left neck. An Ultrasound was performed at that time showing a left submandibular mass suspicious for a necrotic lymph node. He then underwent a CT soft tissue neck to further investigate his symptoms. Scans showed a necrotic 2.6 cm left level II mass is suspicious a conglomerate nodal metastasis. CT chest and head were negative for any abnormalities. 01/04/24 Consult with [name]. 01/14/24 Core biopsy of left neck showing SCC p 16 +. 01/17/24 Direct Laryngoscope with [name] which revealed no obvious abnormalities or signs of primary disease. 01/25/24 PET re-demonstrated the necrotic left-sided level 2 lymph node measuring 3 cm and symmetric upper pharyngeal uptake extending to the left side of the vallecular. No additional areas of abnormal radiotracer uptake at this time to suggest additional areas of disease. 01/28/24 Tonsil biopsy showing SCC. 01/29/24  Radiation consult and 02/06/24 Medical oncology consult. He will receive radiation alone.  Treatment plan:  He will receive 35 fractions of radiation to his left tonsil and bilateral neck which started on 02/20/24 and will complete 04/09/24. Pretreatment procedures: None  PAIN:  Are you having pain? No, but pain radiates into lt ear if dry.  FALLS: Has patient fallen in last 6 months?  See PT evaluation for details   PATIENT GOALS: maintain WNL swallowing  OBJECTIVE:  Note: Objective measures were completed at Evaluation unless otherwise noted. DIAGNOSTIC  FINDINGS: see pertinent history above                                                                                                                            TREATMENT DATE:   05/30/24: Pt reports ageusia today with most POs - has very little appetite. He ate peanut butter crackers and drank water today with mild taste rated as 2 1/2 out of 5 (5=WNL flavor), no overt s/sx oropharyngeal dysphagia. Pt has not done HEP - SLP encouraged him to start this and provided information about greatest chance of this occurring 3-6 months post radiation. He told SLP rationale for HEP, but req'd mod A occasionally to perform correctly. Was independent by session end.   04/29/24: Reports dysgeusia, has been eating fruit and drinking water. Occasional coughing if I'm not careful. Today pt ate applesauce and drank water - with 13 sips water pt had one coughing episode. No difficulty with applesauce.  With HEP pt req'd mod A usually, has not been performing HEP as directed. SLP had to provide rationale for HEP to pt. SLP told pt to perform chin tuck instead of Shaker due to pain from PEG site. SLP educated pt about food journal and pt told benefit back to SLP.   03/06/24: Research states the risk for dysphagia increases due to radiation and/or chemotherapy treatment due to a variety of factors, so SLP educated the pt about the possibility of reduced/limited ability for PO intake during rad tx. SLP also educated pt regarding possible changes to swallowing musculature after rad tx, and why adherence to dysphagia HEP provided today and PO consumption was necessary to inhibit muscle fibrosis following rad tx and to mitigate muscle disuse atrophy. SLP informed pt why this would be detrimental to their swallowing status and to their pulmonary health. Pt demonstrated understanding of these things to SLP. SLP encouraged pt to safely eat and drink as deep into their radiation/chemotherapy as possible to provide the best possible  long-term swallowing outcome for pt. SLP then developed an individualized HEP for pt involving oral and pharyngeal strengthening and ROM and pt was instructed how to perform these exercises, including SLP demonstration. After SLP demonstration, pt return demonstrated each exercise. SLP ensured pt performance was correct prior to educating pt on next exercise. Pt required occasional min cues faded to modified independent to perform HEP. Pt was instructed to complete this program 6-7 days/week, at  least 20 reps a day until 6 months after his or her last day of rad tx, and then x2 a week after that, indefinitely. Among other modifications for days when pt cannot functionally swallow, SLP also suggested pt to perform only non-swallowing tasks on the handout/HEP, and if necessary to cycle through the swallowing portion so the full program of exercises can be completed instead of fatiguing on one of the swallowing exercises and being unable to perform the other swallowing exercises. SLP instructed that swallowing exercises should then be added back into the regimen as pt is able to do so.    PATIENT EDUCATION: Education details: late effects head/neck radiation on swallow function and HEP procedure Person educated: Patient Education method: Explanation, Demonstration, and Verbal cues Education comprehension: verbalized understanding, returned demonstration, verbal cues required, and needs further education   ASSESSMENT:  CLINICAL IMPRESSION: RECERT TODAY. Patient is a 63 y.o. M who was seen today for treatment of swallowing after completion of RT. No oropharyngeal difficulty noted with regular diet and thin liquid POs today. At this time pt swallowing is deemed [facility] with these POs. There are no overt s/s aspiration PNA observed by SLP nor any reported by pt at this time. Data indicate that pt's swallow ability will likely decrease over the course of radiation/chemoradiation therapy and could very well decline  over time following the conclusion of that therapy due to muscle disuse atrophy and/or muscle fibrosis. Pt will cont to need to be seen by SLP in order to assess safety of PO intake, assess the need for recommending any objective swallow assessment, and ensuring pt is correctly completing the individualized HEP.  OBJECTIVE IMPAIRMENTS: include dysphagia. These impairments are limiting patient from safety when swallowing. Factors affecting potential to achieve goals and functional outcome are none noted today. Patient will benefit from skilled SLP services to address above impairments and improve overall function.   REHAB POTENTIAL: Good     GOALS: Goals reviewed with patient? No   SHORT TERM GOALS: Target: 3rd total session   1. Pt will complete HEP with modified independence in 2 sessions Baseline: Goal status: INITIAL   2.  pt will tell SLP why pt is completing HEP with modified independence Baseline:  Goal status: met   3.  pt will describe 3 overt s/s aspiration PNA with modified independence Baseline:  Goal status: INITIAL   4.  pt will tell SLP how a food journal could hasten return to a more normalized diet Baseline:  Goal status: met     LONG TERM GOALS: Target: 7th total session   1.  pt will complete HEP with independence over two visits Baseline:  Goal status: INITIAL   2.  pt will describe how to modify HEP over time, and the timeline associated with reduction in HEP frequency with modified independence over two sessions Baseline:  Goal status: INITIAL     PLAN:   SLP FREQUENCY:  once approx every 4 weeks   SLP DURATION:  7 sessions   PLANNED INTERVENTIONS: Aspiration precaution training, Pharyngeal strengthening exercises, Diet toleration management , Trials of upgraded texture/liquids, SLP instruction and feedback, Compensatory strategies, and Patient/family education, [phone] (treatment of swallowing dysfunction and/or oral function for feeding)     Chevelle Coulson, CCC-SLP 05/30/2024, 10:15 AM

## 2024-06-10 ENCOUNTER — Telehealth: Payer: Self-pay

## 2024-06-10 NOTE — Telephone Encounter (Signed)
 LVM for pt regarding his disability form being completed,faxed, and confirmation received.

## 2024-06-19 ENCOUNTER — Telehealth: Payer: Self-pay | Admitting: Dietician

## 2024-06-19 ENCOUNTER — Telehealth: Payer: Self-pay

## 2024-06-19 ENCOUNTER — Inpatient Hospital Stay: Attending: Radiation Oncology | Admitting: Dietician

## 2024-06-19 NOTE — Telephone Encounter (Signed)
 Nutrition Follow-up:  Patient with SCC of oropharynx, p 16+.  He completed radiation therapy under the care of Dr Izell.     G-tube removed 10/29  Spoke with patient via telephone. He was hoping to be further along, but is being patient.  Throat has gotten really bad in the last couple weeks which is concerning for him. Patient has upcoming follow-up with ENT (11/24) for evaluation.   The taste of foods continues to be a challenge. Acidic foods (salad dressings specifically) and very cold foods burn his throat. Any chicken or chicken dish taste burnt. Reports mac and cheese taste waxy. Notes beverages are tasting more normal than foods. Drinking a lot of V8 juice which is pleasant to drink. Unsweet tea taste normal and Ensure are tolerable. Drinking 3-4 Ensure Original overnight as he often wakes up hungry. Patient has been unable to find Ensure Plus/Complete at the grocery stores. Planning to try [employer].   Patient reports having more energy. Having mild tenderness where PEG was, but says it has healed nicely. He denies nausea, vomiting, diarrhea, constipation.   Medications: reviewed  Labs: no new labs   Anthropometrics: Reports 177-178 lb (without clothes) on home scale. Last wt 188 lb 8 oz on 10/7.   NUTRITION DIAGNOSIS: Inadequate oral intake improving    INTERVENTION:  Continue try new foods, encourage small frequent meals/snacks in soft smooth textures given odynophagia Educated on ongoing high calorie/high protein needs to support post treatment healing  Continue Ensure for added calories and protein - will mail coupons (pt appreciative)  Support and encouragement     MONITORING, EVALUATION, GOAL: wt trends, intake   NEXT VISIT: No follow-up scheduled. Pt will contact with nutrition questions/concerns if needed

## 2024-06-19 NOTE — Telephone Encounter (Signed)
 Pt called in to the office to check on the status of his forms. I let the pt know that it has not been started. Pt state that the form is due by the end of November.

## 2024-06-23 ENCOUNTER — Ambulatory Visit (INDEPENDENT_AMBULATORY_CARE_PROVIDER_SITE_OTHER): Admitting: Otolaryngology

## 2024-06-23 ENCOUNTER — Encounter (INDEPENDENT_AMBULATORY_CARE_PROVIDER_SITE_OTHER): Payer: Self-pay | Admitting: Otolaryngology

## 2024-06-23 VITALS — BP 117/79 | HR 90 | Ht 69.0 in | Wt 183.0 lb

## 2024-06-23 DIAGNOSIS — R1310 Dysphagia, unspecified: Secondary | ICD-10-CM

## 2024-06-23 DIAGNOSIS — C109 Malignant neoplasm of oropharynx, unspecified: Secondary | ICD-10-CM

## 2024-06-23 NOTE — Patient Instructions (Signed)
 Biotene mouthwash Gargle with warm water daily Use sunscreen

## 2024-06-23 NOTE — Progress Notes (Signed)
 Dear Dr. Katina, Here is my assessment for our mutual patient, Douglas Hester. Thank you for allowing me the opportunity to care for your patient. Please do not hesitate to contact me should you have any other questions. Sincerely, Dr. Eldora Blanch  Otolaryngology Clinic Note Referring provider: Dr. Katina HPI:  Douglas Hester is a 63 y.o. male kindly referred by Dr. Katina for evaluation of left neck mass.  Initial visit (12/2023): Patient reports: he had a URI on Mother's day weekend and got antibiotics which helped some. But about 2 weeks later, he noted a growth on his left neck - some tenderness, no fevers. Did not resolve so went to ED where CT was done. Associated symptoms include some headache, Bilateral ear discomfort (left some worse than right), and subjective fever, malaise and some night sweats (chest mostly, but not soaking wet). No sick contacts. Patient otherwise denies: - dysphagia, odynophagia, unintentional weight loss - changes in voice, shortness of breath, hemoptysis  - Denies tobacco use or alcohol use (used to, 4-5 beers on weekend but quit for past 4 years)  --------------------------------------------------------- 01/17/2024 Seen in follow up. 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 He reports neck pain from the biopsy, which is persistent. Swelling improving. No fevers. We had a long discussion regarding his diagnosis and next steps. Interestingly, no lymphoid tissue present in biopsy.  --------------------------------------------------------- 02/07/2024 Returns for follow up. He is doing well overall, with expected post op pain which hsa resolved. No issues with the neck. We did discuss his biopsy in depth. I also chatted with Dr. Izell prior about his plan. --------------------------------------------------------- 06/23/2024 Returns for follow up. He reports that he felt like his throat was starting to somewhat starting to  heal, and then became sore. More sensitive now. No fevers, does not feel like he is having an infection. More sensitive when dry. When throat feels moist, much better. Swallowing fairly well, eating most things. Weight is large stable now. Doing swallowing exercise.  Patient otherwise denies: - aspiration episodes or PNA, need for Heimlich, unintentional weight loss - changes in voice, shortness of breath, hemoptysis  - ear pain  H&N Surgery: Tonsillectomy (as a child), DL/Bx Personal or FHx of bleeding dz or anesthesia difficulty: no  GLP-1: no AP/AC: no  Tobacco: no  PMHx: HTN, Insomnia, Anxiety  Independent Review of Additional Tests or Records:  CT Neck 01/03/2024 independently interpreted: large left jugulogastric node, internal cystic component; no significant stranding; left carotid medialized and this does appear to cause some asymmetry left OP and lingual tonsil prominence; noted slight left GT sulcus and hypopharynx asymmetry; no other concerning nodes noted. PET/CT 01/25/2024: left level 2 adenopathy with hyperavidity; noted relatively symmetric uptake over tonsillar fossa and lingual tonsils b/l; no other significant areas of uptake identified. 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 Has PEG tube. Seeing SLP; f/u Dec 2025 with PET prior Lupita Connor (05/30/2024): noted ageusia, little apetite, no sx of dysphagia; Not done HEP; no c/f aspiration; rec therapy CBC and CMP 05/17/2024: WBC 5.9, BUN/Cr 15/0.97 PMH/Meds/All/SocHx/FamHx/ROS:   Past Medical History:  Diagnosis Date   Anxiety    Chronic GERD    Heart murmur    Hypertension    Tachycardia      Past Surgical History:  Procedure Laterality Date   CHOLECYSTECTOMY N/A 10/11/2018   Procedure: LAPAROSCOPIC CHOLECYSTECTOMY WITH INTRAOPERATIVE  CHOLANGIOGRAM;  Surgeon: Curvin Deward MOULD, MD;  Location:  [facility];  Service: General;  Laterality: N/A;   COLONOSCOPY  ~ 2014   done by GI in [place]. Polyps were removed. Patient not aware what type of polyps these were   DIRECT LARYNGOSCOPY N/A 01/28/2024   Procedure: LARYNGOSCOPY, DIRECT WITH BIOPSIES;  Surgeon: Tobie Eldora NOVAK, MD;  Location: [facility] OR;  Service: ENT;  Laterality: N/A;   ESOPHAGOGASTRODUODENOSCOPY  early 1990s   MD found ulcer   INGUINAL HERNIA REPAIR Left ~ 2010   with Mesh.    IR GASTROSTOMY TUBE MOD SED  03/13/2024   IR GASTROSTOMY TUBE MOD SED  04/03/2024   IR GASTROSTOMY TUBE REMOVAL  04/02/2024   IR GASTROSTOMY TUBE REMOVAL  05/28/2024   VASECTOMY      Family History  Problem Relation Age of Onset   Lung cancer Mother    Lymphoma Father    Valvular heart disease Sister      Social Connections: Not on file      Current Outpatient Medications:    acetaminophen  (TYLENOL ) 500 MG tablet, Take 500-1,000 mg by mouth 2 (two) times daily as needed (pain.)., Disp: , Rfl:    buPROPion  (WELLBUTRIN  XL) 150 MG 24 hr tablet, Take 1 tablet (150 mg total) by mouth daily., Disp: 90 tablet, Rfl: 0   clonazePAM  (KLONOPIN ) 0.5 MG tablet, Take 1 tablet as needed for severe anxiety/panic attacks., Disp: 15 tablet, Rfl: 0   fluconazole  (DIFLUCAN ) 10 MG/ML suspension, Take 20mL today, then 10mL daily for 20 days. Ok to take by PEG tube., Disp: 220 mL, Rfl: 0   ibuprofen (ADVIL) 600 MG tablet, Take 600 mg by mouth., Disp: , Rfl:    lamoTRIgine  (LAMICTAL ) 150 MG tablet, Take 1 tablet (150 mg total) by mouth daily., Disp: 30 tablet, Rfl: 2   Melatonin 5 MG TABS, Take 5 mg by mouth at bedtime as needed (sleep)., Disp: , Rfl:    metoCLOPramide  (REGLAN ) 5 MG/5ML solution, Take 10 mLs (10 mg total) by mouth 4 (four) times daily -  before meals and at bedtime. Ok to take by tube. If you have 4 meals a day, do not take at bedtime., Disp: 960 mL, Rfl: 2   mirtazapine  (REMERON ) 15 MG tablet, Take 1 tablet (15 mg total) by mouth at bedtime., Disp: 90  tablet, Rfl: 0   Nutritional Supplements (NUTREN 1.5) LIQD, 7 cartons Nutren 1.5/equivalent split over 4 feedings Flush tube with 60 ml water before and after each bolus Give additional 3.5 cups water (830 ml water) via PEG/po to meet hydration needs  Provides 2625 kcal, 119 g, 1337 ml free water (2647 ml total water). 1750 ml/day meets >95% est needs, 100% DRI, Disp: , Rfl:    olmesartan (BENICAR) 5 MG tablet, Take 5 mg by mouth daily., Disp: , Rfl:    oxyCODONE  (ROXICODONE ) 5 MG/5ML solution, Take 5-10 mLs (5-10 mg total) by mouth every 3 (three) hours as needed for severe pain (pain score 7-10). Take w/ food., Disp: 450 mL, Rfl: 0   PARoxetine (PAXIL) 30 MG tablet, Take 30 mg by mouth., Disp: , Rfl:    polyethylene glycol (MIRALAX  / GLYCOLAX ) 17 g packet, Take 17 g by mouth 2 (two) times daily., Disp: 14 each, Rfl: 0   promethazine  (PHENERGAN ) 25 MG tablet, Take 1 tablet (25 mg total) by mouth every 6 (six) hours as needed for nausea or vomiting., Disp: 30 tablet, Rfl: 2   rOPINIRole (REQUIP) 0.5 MG  tablet, SMARTSIG:1 Tablet(s) By Mouth Every Evening, Disp: , Rfl:    traZODone  (DESYREL ) 100 MG tablet, Take 100 mg by mouth at bedtime as needed., Disp: , Rfl:    lidocaine  (XYLOCAINE ) 2 % solution, Use as directed 15 mLs in the mouth or throat as needed for mouth pain. Mix 1 part 2%viscous lidocaine ,1part H2O.Swish and/or swallow 10ml of this mixture,59min before meals and at bedtime, up to QID (Patient not taking: Reported on 06/23/2024), Disp: 100 mL, Rfl: 5   LORazepam  (ATIVAN ) 0.5 MG tablet, Take 1 tablet (0.5 mg total) by mouth as needed for anxiety. Take 1 tablet 20 minutes prior to radiation treatment. Please have a driver given sedative effects. (Patient not taking: Reported on 06/23/2024), Disp: 35 tablet, Rfl: 0   metoprolol  succinate (TOPROL -XL) 25 MG 24 hr tablet, Take 25 mg by mouth every evening. (Patient not taking: Reported on 06/23/2024), Disp: , Rfl:    Physical Exam:   BP 117/79 (BP  Location: Right Arm, Patient Position: Sitting, Cuff Size: Large)   Pulse 90   Ht 5' 9 (1.753 m)   Wt 183 lb (83 kg)   SpO2 96%   BMI 27.02 kg/m   Salient findings:  CN II-XII intact Bilateral EAC clear and TM intact with well pneumatized middle ear spaces No lesions of oral cavity/oropharynx; dentition fair; strong gag No obviously palpable neck masses/lymphadenopathy/thyromegaly -- cannot feel neck mass No respiratory distress or stridor; mild throat discomfort so TFL was indicated to better evaluate the proximal airway, given the patient's history and exam findings, and is detailed below.  Seprately Identifiable Procedures:  Prior to initiating any procedures, risks/benefits/alternatives were explained to the patient and verbal consent obtained.  Procedure Note Pre-procedure diagnosis:  Carcinoma of tongue base and lingual tonsil, odynophagia Post-procedure diagnosis: Same Procedure: Transnasal Fiberoptic Laryngoscopy, CPT 31575 - Mod 25 Indication: see above Complications: None apparent EBL: 0 mL  The procedure was undertaken to further evaluate the patient's complaint above, with mirror exam inadequate for appropriate examination due to gag reflex and poor patient tolerance  Procedure:  Patient was identified as correct patient. Verbal consent was obtained. The nose was sprayed with oxymetazoline  and 4% lidocaine . The The flexible laryngoscope was passed through the nose to view the nasal cavity, pharynx (oropharynx, hypopharynx) and larynx.  The larynx was examined at rest and during multiple phonatory tasks. Documentation was obtained and reviewed with patient. The scope was removed. The patient tolerated the procedure well.  Findings: The nasal cavity and nasopharynx did not reveal any masses or lesions, mucosa appeared to be without obvious lesions. The tongue base, pharyngeal walls, piriform sinuses, vallecula, epiglottis and postcricoid region are normal in appearance  EXCEPT --- expected fullness and post-radiation change with mild edema to lingual tonsils; The visualized portion of the subglottis and proximal trachea is widely patent. The vocal folds are mobile bilaterally. There are no lesions on the free edge of the vocal folds nor elsewhere in the larynx worrisome for malignancy.     Electronically signed by: Eldora KATHEE Blanch, MD 06/23/2024 2:09 PM   Impression & Plans:  Jihad Brownlow is a 63 y.o. 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 Odynophagia    T1N1M0 SCCa tongue base/lingual tonsil p16+ Having some odynophagia but mostly when throat is dry. Overall, no signs of infection and he is swallowing fairly well  TFL reassuring As such, we will manage conservatively for now and discussed red-flag symptoms. Will see him back  in January after PET Return precautions discussed  Thank you for allowing me the opportunity to care for your patient. Please do not hesitate to contact me should you have any other questions.  Sincerely, Eldora Blanch, MD Otolaryngologist (ENT), [facility] Phone: [phone] Fax: [phone]  06/23/2024, 2:09 PM   I have personally spent 30 minutes involved in face-to-face and non-face-to-face activities for this patient on the day of the visit.  Professional time spent excludes any procedures performed but includes the following activities, in addition to those noted in the documentation: preparing to see the patient (review of outside documentation and results), performing a medically appropriate examination, counseling, documenting in the electronic health record

## 2024-06-24 ENCOUNTER — Telehealth: Payer: Self-pay | Admitting: *Deleted

## 2024-06-24 NOTE — Telephone Encounter (Addendum)
 Douglas Hester, [phone] (home) called to check status of FMLA paperwork.  I want to pick it up to deliver to my employer by the end of the week.  Returned call to ,Douglas Hester to advise of form completed and has been signed by provider.  States he will come by today for pick up.  Copy to [facility] bin for items to be scanned.  No further instructions received, actions performed or required by this nurse.

## 2024-06-30 ENCOUNTER — Ambulatory Visit: Attending: Radiation Oncology

## 2024-06-30 DIAGNOSIS — R1310 Dysphagia, unspecified: Secondary | ICD-10-CM | POA: Diagnosis present

## 2024-06-30 NOTE — Patient Instructions (Signed)
   Signs of Aspiration Pneumonia   Chest pain/tightness Fever (can be low grade) Cough  With foul-smelling phlegm (sputum) With sputum containing pus or blood With greenish sputum Fatigue  Shortness of breath  Wheezing   **IF YOU HAVE THESE SIGNS, CONTACT YOUR DOCTOR OR GO TO THE EMERGENCY DEPARTMENT OR URGENT CARE AS SOON AS POSSIBLE**

## 2024-06-30 NOTE — Therapy (Signed)
 OUTPATIENT SPEECH LANGUAGE PATHOLOGY ONCOLOGY TREATMENT   Patient Name: Douglas Hester MRN: [id number] DOB:[birth year], 63 y.o., male Today's Date: 06/30/2024  PCP: Katina Pfeiffer, MD REFERRING PROVIDER: Montell Domino, MD  END OF SESSION:  End of Session - 06/30/24 1611     Visit Number 4    Number of Visits 7    Date for Recertification  08/28/24    SLP Start Time 1533    SLP Stop Time  1603    SLP Time Calculation (min) 30 min    Activity Tolerance Patient tolerated treatment well            Past Medical History:  Diagnosis Date   Anxiety    Chronic GERD    Heart murmur    Hypertension    Tachycardia    Past Surgical History:  Procedure Laterality Date   CHOLECYSTECTOMY N/A 10/11/2018   Procedure: LAPAROSCOPIC CHOLECYSTECTOMY WITH INTRAOPERATIVE CHOLANGIOGRAM;  Surgeon: Curvin Deward MOULD, MD;  Location: [facility];  Service: General;  Laterality: N/A;   COLONOSCOPY  ~ 2014   done by GI in [place]. Polyps were removed. Patient not aware what type of polyps these were   DIRECT LARYNGOSCOPY N/A 01/28/2024   Procedure: LARYNGOSCOPY, DIRECT WITH BIOPSIES;  Surgeon: Tobie Eldora NOVAK, MD;  Location: [facility] OR;  Service: ENT;  Laterality: N/A;   ESOPHAGOGASTRODUODENOSCOPY  early 1990s   MD found ulcer   INGUINAL HERNIA REPAIR Left ~ 2010   with Mesh.    IR GASTROSTOMY TUBE MOD SED  03/13/2024   IR GASTROSTOMY TUBE MOD SED  04/03/2024   IR GASTROSTOMY TUBE REMOVAL  04/02/2024   IR GASTROSTOMY TUBE REMOVAL  05/28/2024   VASECTOMY     Patient Active Problem List   Diagnosis Date Noted   Malnutrition of moderate degree 03/19/2024   Lower GI bleed 03/18/2024   Squamous cell carcinoma of left tonsil (HCC) 02/06/2024   Lingual tonsil hypertrophy 01/28/2024   Biliary colic 10/11/2018   Gallstones 10/11/2018   Hypokalemia 05/01/2017   Elevated LFTs 05/01/2017   Elevated lipase 05/01/2017   Chest pain 04/29/2017   Fever, unspecified 04/29/2017   Tachycardia 04/29/2017    Hyperglycemia 04/29/2017    ONSET DATE: June 2025   REFERRING DIAG: SCC of lt tonsil  THERAPY DIAG:  Dysphagia, unspecified type  Rationale for Evaluation and Treatment: Rehabilitation  SUBJECTIVE:   SUBJECTIVE STATEMENT: I do the laying down exercise - 30 of those (1-2 seconds each). SLP encouraged pt to complete 45 second holds x3 in addition.    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 He presented to the ED on 01/03/24 with c/o headaches and an enlarged lymph node to his left neck. An Ultrasound was performed at that time showing a left submandibular mass suspicious for a necrotic lymph node. He then underwent a CT soft tissue neck to further investigate his symptoms. Scans showed a necrotic 2.6 cm left level II mass is suspicious a conglomerate nodal metastasis. CT chest and head were negative for any abnormalities. 01/04/24 Consult with [name]. 01/14/24 Core biopsy of left neck showing SCC p 16 +. 01/17/24 Direct Laryngoscope with [name] which revealed no obvious abnormalities or signs of primary disease. 01/25/24 PET re-demonstrated the necrotic left-sided level 2 lymph node measuring 3 cm and symmetric upper pharyngeal uptake extending to the left side of the vallecular. No additional areas of  abnormal radiotracer uptake at this time to suggest additional areas of disease. 01/28/24 Tonsil biopsy showing SCC. 01/29/24 Radiation consult and 02/06/24 Medical oncology consult. He will receive radiation alone.  Treatment plan:  He will receive 35 fractions of radiation to his left tonsil and bilateral neck which started on 02/20/24 and will complete 04/09/24. Pretreatment procedures: None  PAIN:  Are you having pain? No, but pain radiates into lt ear if dry.  FALLS: Has patient fallen in last 6 months?  See PT evaluation for details   PATIENT GOALS: maintain WNL swallowing  OBJECTIVE:  Note:  Objective measures were completed at Evaluation unless otherwise noted. DIAGNOSTIC FINDINGS: see pertinent history above                                                                                                                            TREATMENT DATE:   06/30/24: SLP provided pt s/sx aspiration PNA today - pt told SLP with mod I.  Xerostomia has improved but pt says that it is still decr'd compared to prior to rad tx. Saw [name] recently - structurally things (reportedly) look WNL. [name] told pt to massage throat - pt has begun doing this. Eating smaller portions so he can taste more- ageusia increases as meal progresses. Grilled cheese, soup, in last 24 hours - pt is not omitting foods at this time. HEP - pt was independent with procedure, is not performing 30 reps/day for effortful, Masako, or Mendelsohn. SLP encouraged pt to get as close to 30 as possible/day and reminded of rationale. Pt and SLP agreed pt could decr frequency to once/8 weeks.  05/30/24: Pt reports ageusia today with most POs - has very little appetite. He ate peanut butter crackers and drank water today with mild taste rated as 2 1/2 out of 5 (5=WNL flavor), no overt s/sx oropharyngeal dysphagia. Pt has not done HEP - SLP encouraged him to start this and provided information about greatest chance of this occurring 3-6 months post radiation. He told SLP rationale for HEP, but req'd mod A occasionally to perform correctly. Was independent by session end.   04/29/24: Reports dysgeusia, has been eating fruit and drinking water. Occasional coughing if I'm not careful. Today pt ate applesauce and drank water - with 13 sips water pt had one coughing episode. No difficulty with applesauce.  With HEP pt req'd mod A usually, has not been performing HEP as directed. SLP had to provide rationale for HEP to pt. SLP told pt to perform chin tuck instead of Shaker due to pain from PEG site. SLP educated pt about food journal and pt  told benefit back to SLP.   03/06/24: Research states the risk for dysphagia increases due to radiation and/or chemotherapy treatment due to a variety of factors, so SLP educated the pt about the possibility of reduced/limited ability for PO intake during rad tx. SLP also educated pt regarding possible changes to swallowing musculature after rad  tx, and why adherence to dysphagia HEP provided today and PO consumption was necessary to inhibit muscle fibrosis following rad tx and to mitigate muscle disuse atrophy. SLP informed pt why this would be detrimental to their swallowing status and to their pulmonary health. Pt demonstrated understanding of these things to SLP. SLP encouraged pt to safely eat and drink as deep into their radiation/chemotherapy as possible to provide the best possible long-term swallowing outcome for pt. SLP then developed an individualized HEP for pt involving oral and pharyngeal strengthening and ROM and pt was instructed how to perform these exercises, including SLP demonstration. After SLP demonstration, pt return demonstrated each exercise. SLP ensured pt performance was correct prior to educating pt on next exercise. Pt required occasional min cues faded to modified independent to perform HEP. Pt was instructed to complete this program 6-7 days/week, at least 20 reps a day until 6 months after his or her last day of rad tx, and then x2 a week after that, indefinitely. Among other modifications for days when pt cannot functionally swallow, SLP also suggested pt to perform only non-swallowing tasks on the handout/HEP, and if necessary to cycle through the swallowing portion so the full program of exercises can be completed instead of fatiguing on one of the swallowing exercises and being unable to perform the other swallowing exercises. SLP instructed that swallowing exercises should then be added back into the regimen as pt is able to do so.    PATIENT EDUCATION: Education details:  late effects head/neck radiation on swallow function and HEP procedure Person educated: Patient Education method: Explanation, Demonstration, and Verbal cues Education comprehension: verbalized understanding, returned demonstration, verbal cues required, and needs further education   ASSESSMENT:  CLINICAL IMPRESSION: SLP moved goal for aspiration PNA s/sx to LTGs. Patient is a 63 y.o. M who was seen today for treatment of swallowing after completion of RT. No oropharyngeal difficulty noted with regular diet and thin liquid POs today. At this time pt swallowing is deemed cont'd WNL/WFL with these POs. There are no overt s/s aspiration PNA observed by SLP nor any reported by pt at this time. Data indicate that pt's swallow ability will likely decrease over the course of radiation/chemoradiation therapy and could very well decline over time following the conclusion of rad therapy due to muscle disuse atrophy and/or muscle fibrosis. Pt will cont to need to be seen by SLP in order to assess safety of PO intake, assess the need for recommending any objective swallow assessment, and ensuring pt is correctly completing the individualized HEP. SLP and pt agreed to decr frequency to once in approx 8 weeks.  OBJECTIVE IMPAIRMENTS: include dysphagia. These impairments are limiting patient from safety when swallowing. Factors affecting potential to achieve goals and functional outcome are none noted today. Patient will benefit from skilled SLP services to address above impairments and improve overall function.   REHAB POTENTIAL: Good     GOALS: Goals reviewed with patient? No   SHORT TERM GOALS: Target: 3rd total session   1. Pt will complete HEP with modified independence in 2 sessions Baseline: Goal status: not met   2.  pt will tell SLP why pt is completing HEP with modified independence Baseline:  Goal status: met   3.  pt will describe 3 overt s/s aspiration PNA with modified  independence Baseline:  Goal status: deferred to LTGs   4.  pt will tell SLP how a food journal could hasten return to a more normalized diet Baseline:  Goal status: met     LONG TERM GOALS: Target: 7th total session   1.  pt will complete HEP with independence over two visits Baseline:  Goal status: INITIAL   2.  pt will describe how to modify HEP over time, and the timeline associated with reduction in HEP frequency with modified independence over two sessions Baseline:  Goal status: INITIAL  3.  pt will describe 3 overt s/s aspiration PNA with modified independence Baseline:  Goal status: met     PLAN:   SLP FREQUENCY:  once approx every 8 weeks   SLP DURATION:  7 sessions   PLANNED INTERVENTIONS: Aspiration precaution training, Pharyngeal strengthening exercises, Diet toleration management , Trials of upgraded texture/liquids, SLP instruction and feedback, Compensatory strategies, and Patient/family education, [phone] (treatment of swallowing dysfunction and/or oral function for feeding)    Jevan Gaunt, CCC-SLP 06/30/2024, 4:12 PM

## 2024-07-02 ENCOUNTER — Encounter (HOSPITAL_BASED_OUTPATIENT_CLINIC_OR_DEPARTMENT_OTHER): Payer: Self-pay | Admitting: Emergency Medicine

## 2024-07-02 ENCOUNTER — Emergency Department (HOSPITAL_BASED_OUTPATIENT_CLINIC_OR_DEPARTMENT_OTHER)
Admission: EM | Admit: 2024-07-02 | Discharge: 2024-07-03 | Disposition: A | Attending: Emergency Medicine | Admitting: Emergency Medicine

## 2024-07-02 ENCOUNTER — Other Ambulatory Visit: Payer: Self-pay

## 2024-07-02 DIAGNOSIS — Z85818 Personal history of malignant neoplasm of other sites of lip, oral cavity, and pharynx: Secondary | ICD-10-CM | POA: Insufficient documentation

## 2024-07-02 DIAGNOSIS — R1031 Right lower quadrant pain: Secondary | ICD-10-CM | POA: Diagnosis not present

## 2024-07-02 DIAGNOSIS — K769 Liver disease, unspecified: Secondary | ICD-10-CM

## 2024-07-02 DIAGNOSIS — C099 Malignant neoplasm of tonsil, unspecified: Secondary | ICD-10-CM

## 2024-07-02 DIAGNOSIS — R10A1 Flank pain, right side: Secondary | ICD-10-CM | POA: Diagnosis present

## 2024-07-02 LAB — CBC
HCT: 39.7 % (ref 39.0–52.0)
Hemoglobin: 12.9 g/dL — ABNORMAL LOW (ref 13.0–17.0)
MCH: 28.7 pg (ref 26.0–34.0)
MCHC: 32.5 g/dL (ref 30.0–36.0)
MCV: 88.4 fL (ref 80.0–100.0)
Platelets: 214 K/uL (ref 150–400)
RBC: 4.49 MIL/uL (ref 4.22–5.81)
RDW: 12.6 % (ref 11.5–15.5)
WBC: 7.5 K/uL (ref 4.0–10.5)
nRBC: 0 % (ref 0.0–0.2)

## 2024-07-02 LAB — URINALYSIS, ROUTINE W REFLEX MICROSCOPIC
Bilirubin Urine: NEGATIVE
Glucose, UA: NEGATIVE mg/dL
Hgb urine dipstick: NEGATIVE
Ketones, ur: NEGATIVE mg/dL
Leukocytes,Ua: NEGATIVE
Nitrite: NEGATIVE
Protein, ur: NEGATIVE mg/dL
Specific Gravity, Urine: 1.015 (ref 1.005–1.030)
pH: 6 (ref 5.0–8.0)

## 2024-07-02 LAB — COMPREHENSIVE METABOLIC PANEL WITH GFR
ALT: 26 U/L (ref 0–44)
AST: 20 U/L (ref 15–41)
Albumin: 4 g/dL (ref 3.5–5.0)
Alkaline Phosphatase: 92 U/L (ref 38–126)
Anion gap: 10 (ref 5–15)
BUN: 13 mg/dL (ref 8–23)
CO2: 27 mmol/L (ref 22–32)
Calcium: 9.5 mg/dL (ref 8.9–10.3)
Chloride: 100 mmol/L (ref 98–111)
Creatinine, Ser: 0.73 mg/dL (ref 0.61–1.24)
GFR, Estimated: >60 mL/min (ref >60)
Glucose, Bld: 129 mg/dL — ABNORMAL HIGH (ref 70–99)
Potassium: 4.1 mmol/L (ref 3.5–5.1)
Sodium: 137 mmol/L (ref 135–145)
Total Bilirubin: 0.3 mg/dL (ref 0.0–1.2)
Total Protein: 6.9 g/dL (ref 6.5–8.1)

## 2024-07-02 LAB — LIPASE, BLOOD: Lipase: 23 U/L (ref 11–51)

## 2024-07-02 NOTE — ED Provider Notes (Signed)
  EMERGENCY DEPARTMENT AT [facility] Provider Note   CSN: [id number] Arrival date & time: 07/02/24  2048     Patient presents with: Abdominal Pain   Douglas Hester is a 63 y.o. male.  {Add pertinent medical, surgical, social history, OB history to YEP:67052} Patient to ED for evaluation of right side abdominal pain radiating to right flank that started around 4:00 am this morning. He did not have any nausea or vomiting. He states the pain went away when he got up from bed and was upright. He was able to go about his normal activity through the morning but found that the pain returned when he got into a lying position again. He reports the pain as sharp and intense. He states that throughout the day the pain will return when lying and resolve when upright. History of throat CA, s/p PEG tube removal one month ago, s/p cholecystectomy. No fever today. No change in bowel movements today.  The history is provided by the patient. No language interpreter was used.  Abdominal Pain      Prior to Admission medications   Medication Sig Start Date End Date Taking? Authorizing Provider  acetaminophen  (TYLENOL ) 500 MG tablet Take 500-1,000 mg by mouth 2 (two) times daily as needed (pain.).    [provider]  buPROPion  (WELLBUTRIN  XL) 150 MG 24 hr tablet Take 1 tablet (150 mg total) by mouth daily. 04/21/24   Arfeen, Leni DASEN, MD  clonazePAM  (KLONOPIN ) 0.5 MG tablet Take 1 tablet as needed for severe anxiety/panic attacks. 04/28/24   Arfeen, Leni DASEN, MD  fluconazole  (DIFLUCAN ) 10 MG/ML suspension Take 20mL today, then 10mL daily for 20 days. Ok to take by PEG tube. 03/24/24   Izell Domino, MD  ibuprofen (ADVIL) 600 MG tablet Take 600 mg by mouth. 10/19/12   [provider]  lamoTRIgine  (LAMICTAL ) 150 MG tablet Take 1 tablet (150 mg total) by mouth daily. 04/21/24   Arfeen, Leni DASEN, MD  lidocaine  (XYLOCAINE ) 2 % solution Use as directed 15 mLs in the mouth or throat as  needed for mouth pain. Mix 1 part 2%viscous lidocaine ,1part H2O.Swish and/or swallow 10ml of this mixture,34min before meals and at bedtime, up to QID Patient not taking: Reported on 06/23/2024 03/05/24   Wyatt Leeroy HERO, PA-C  LORazepam  (ATIVAN ) 0.5 MG tablet Take 1 tablet (0.5 mg total) by mouth as needed for anxiety. Take 1 tablet 20 minutes prior to radiation treatment. Please have a driver given sedative effects. Patient not taking: Reported on 06/23/2024 02/13/24   Wyatt Leeroy HERO, PA-C  Melatonin 5 MG TABS Take 5 mg by mouth at bedtime as needed (sleep).    [provider]  metoCLOPramide  (REGLAN ) 5 MG/5ML solution Take 10 mLs (10 mg total) by mouth 4 (four) times daily -  before meals and at bedtime. Ok to take by tube. If you have 4 meals a day, do not take at bedtime. 04/01/24   Izell Domino, MD  metoprolol  succinate (TOPROL -XL) 25 MG 24 hr tablet Take 25 mg by mouth every evening. Patient not taking: Reported on 06/23/2024 09/18/22   [provider]  mirtazapine  (REMERON ) 15 MG tablet Take 1 tablet (15 mg total) by mouth at bedtime. 04/21/24 07/20/24  Arfeen, Leni DASEN, MD  Nutritional Supplements (NUTREN 1.5) LIQD 7 cartons Nutren 1.5/equivalent split over 4 feedings Flush tube with 60 ml water before and after each bolus Give additional 3.5 cups water (830 ml water) via PEG/po to meet hydration needs  Provides 2625 kcal, 119 g, 1337 ml free water (2647 ml total water). 1750 ml/day meets >95% est needs, 100% DRI 03/14/24   Izell Domino, MD  olmesartan (BENICAR) 5 MG tablet Take 5 mg by mouth daily. 05/28/24   [provider]  oxyCODONE  (ROXICODONE ) 5 MG/5ML solution Take 5-10 mLs (5-10 mg total) by mouth every 3 (three) hours as needed for severe pain (pain score 7-10). Take w/ food. 04/07/24   Izell Domino, MD  PARoxetine (PAXIL) 30 MG tablet Take 30 mg by mouth.    [provider]  polyethylene glycol (MIRALAX  / GLYCOLAX ) 17 g packet Take 17 g by mouth 2 (two)  times daily. 03/20/24   Regalado, Owen A, MD  promethazine  (PHENERGAN ) 25 MG tablet Take 1 tablet (25 mg total) by mouth every 6 (six) hours as needed for nausea or vomiting. 03/17/24   Izell Domino, MD  rOPINIRole (REQUIP) 0.5 MG tablet SMARTSIG:1 Tablet(s) By Mouth Every Evening 06/06/24   [provider]  traZODone  (DESYREL ) 100 MG tablet Take 100 mg by mouth at bedtime as needed. 05/29/24   [provider]    Allergies: Patient has no known allergies.    Review of Systems  Gastrointestinal:  Positive for abdominal pain.    Updated Vital Signs BP 114/72 (BP Location: Right Arm)   Pulse 82   Temp 97.7 F (36.5 C) (Oral)   Resp 16   SpO2 100%   Physical Exam Vitals and nursing note reviewed.  Constitutional:      Appearance: He is well-developed.  HENT:     Head: Normocephalic.  Cardiovascular:     Rate and Rhythm: Normal rate and regular rhythm.     Heart sounds: No murmur heard. Pulmonary:     Effort: Pulmonary effort is normal.     Breath sounds: Normal breath sounds. No wheezing, rhonchi or rales.  Abdominal:     General: Bowel sounds are normal.     Palpations: Abdomen is soft.     Tenderness: There is no abdominal tenderness (Abdomen is nontender to light and deep palpation, both sitting up and lying flat.). There is no guarding or rebound.  Musculoskeletal:        General: Normal range of motion.     Cervical back: Normal range of motion and neck supple.  Skin:    General: Skin is warm and dry.  Neurological:     General: No focal deficit present.     Mental Status: He is alert and oriented to person, place, and time.     (all labs ordered are listed, but only abnormal results are displayed) Labs Reviewed  COMPREHENSIVE METABOLIC PANEL WITH GFR - Abnormal; Notable for the following components:      Result Value   Glucose, Bld 129 (*)    All other components within normal limits  CBC - Abnormal; Notable for the following components:    Hemoglobin 12.9 (*)    All other components within normal limits  LIPASE, BLOOD  URINALYSIS, ROUTINE W REFLEX MICROSCOPIC    EKG: None  Radiology: No results found.  {Document cardiac monitor, telemetry assessment procedure when appropriate:32947} Procedures   Medications Ordered in the ED - No data to display  Clinical Course as of 07/02/24 2352  Wed Jul 02, 2024  2350 Patient to ED with severe right abdominal pain when lying down that started early this morning, resolved when in upright position. No nausea, vomiting, fever, urinary sxs, BM changes. Exam is benign in both  lying and upright positions in ED. Labs unremarkable.   Patient placed in supine position and will be observed for return of symptoms.  [SU]    Clinical Course User Index [SU] Odell Balls, PA-C   {Click here for ABCD2, HEART and other calculators REFRESH Note before signing:1}                              Medical Decision Making Amount and/or Complexity of Data Reviewed Labs: ordered.   ***  {Document critical care time when appropriate  Document review of labs and clinical decision tools ie CHADS2VASC2, etc  Document your independent review of radiology images and any outside records  Document your discussion with family members, caretakers and with consultants  Document social determinants of health affecting pt's care  Document your decision making why or why not admission, treatments were needed:32947:::1}   Final diagnoses:  None    ED Discharge Orders     None

## 2024-07-02 NOTE — ED Triage Notes (Signed)
 Pt with RLQ pain that began today.  No n/v/d or urinary symptoms. Reports pain is worse to unbearable lying flat, somewhat better sitting up, and not at all with ambulating/standing.  No fever or chills.

## 2024-07-03 ENCOUNTER — Emergency Department (HOSPITAL_BASED_OUTPATIENT_CLINIC_OR_DEPARTMENT_OTHER)

## 2024-07-03 ENCOUNTER — Other Ambulatory Visit: Payer: Self-pay

## 2024-07-03 DIAGNOSIS — K769 Liver disease, unspecified: Secondary | ICD-10-CM

## 2024-07-03 MED ORDER — IOHEXOL 300 MG/ML  SOLN
100.0000 mL | Freq: Once | INTRAMUSCULAR | Status: AC | PRN
  Administered 2024-07-03: 100 mL via INTRAVENOUS

## 2024-07-03 MED ORDER — HYDROCODONE-ACETAMINOPHEN 5-325 MG PO TABS: 1.0000 | ORAL_TABLET | Freq: Four times a day (QID) | ORAL | 0 refills | Status: DC | PRN

## 2024-07-03 NOTE — ED Provider Notes (Signed)
 Care assumed at shift change. Here with intermittent flank pain, seems positional, history of throat CA, recently had PEG removed. Labs OK, awaiting CT Physical Exam  BP 114/72 (BP Location: Right Arm)   Pulse 82   Temp 97.7 F (36.5 C) (Oral)   Resp 16   SpO2 100%   Physical Exam  Procedures  Procedures  ED Course / MDM   Clinical Course as of 07/03/24 0058  Wed Jul 02, 2024  2350 Patient to ED with severe right abdominal pain when lying down that started early this morning, resolved when in upright position. No nausea, vomiting, fever, urinary sxs, BM changes. Exam is benign in both lying and upright positions in ED. Labs unremarkable.   Patient placed in supine position and will be observed for return of symptoms.  [SU]  Thu Jul 03, 2024  0005 Patient returned as significant/severe as previous. Patient returned to upright position and CT abd/pel ordered.  [SU]  0007 Patient care signed out to Dr. Roselyn at end of shift.  [SU]  0052 I personally viewed the images from radiology studies and agree with radiologist interpretation: CT consistent with liver mets. Patient reports pain is only bothering him when he is supine, will send Rx for pain medication to take as needed. He is already scheduled for surveillance PET in 10 day. He has completed radiation treatment for tonsil cancer but has not had any chemotherapy. Recommend close outpatient Oncology follow up for further management and RTED for any other acute concerns.  [CS]    Clinical Course User Index [CS] Roselyn Carlin NOVAK, MD [SU] Odell Balls, PA-C   Medical Decision Making Amount and/or Complexity of Data Reviewed Labs: ordered. Radiology: ordered.  Risk Prescription drug management.          Roselyn Carlin NOVAK, MD 07/03/24 [phone]

## 2024-07-09 ENCOUNTER — Telehealth: Payer: Self-pay | Admitting: Radiation Oncology

## 2024-07-09 ENCOUNTER — Inpatient Hospital Stay (HOSPITAL_COMMUNITY): Admission: RE | Admit: 2024-07-09 | Discharge: 2024-07-09 | Attending: Radiology

## 2024-07-09 DIAGNOSIS — C4442 Squamous cell carcinoma of skin of scalp and neck: Secondary | ICD-10-CM | POA: Insufficient documentation

## 2024-07-09 DIAGNOSIS — C099 Malignant neoplasm of tonsil, unspecified: Secondary | ICD-10-CM | POA: Diagnosis present

## 2024-07-09 LAB — GLUCOSE, CAPILLARY: Glucose-Capillary: 87 mg/dL (ref 70–99)

## 2024-07-09 MED ORDER — FLUDEOXYGLUCOSE F - 18 (FDG) INJECTION
9.0200 | Freq: Once | INTRAVENOUS | Status: AC
  Administered 2024-07-09: 9.02 via INTRAVENOUS

## 2024-07-09 NOTE — Telephone Encounter (Signed)
 12/10 @ 2:24 pm Left voicemail for patient to call our office to be sch for follow up appt.

## 2024-07-10 NOTE — Progress Notes (Signed)
 Pain issues, if any: None Using a feeding tube?: None Weight changes, if any: Weight has stayed stable Swallowing issues, if any: None,  Smoking or chewing tobacco? None Using fluoride toothpaste daily? Yes Last ENT visit was on: December 2025 Other notable issues, if any:  Taste buds    BP 121/76 (BP Location: Left Arm, Patient Position: Sitting)   Pulse 89   Temp (!) 97.1 F (36.2 C) (Temporal)   Resp 18   Ht 5' 9 (1.753 m)   Wt 183 lb (83 kg)   SpO2 98%   BMI 27.02 kg/m   Wt Readings from Last 3 Encounters:  07/18/24 183 lb (83 kg)  06/23/24 183 lb (83 kg)  05/10/24 188 lb 0.8 oz (85.3 kg)

## 2024-07-14 ENCOUNTER — Encounter (HOSPITAL_COMMUNITY)

## 2024-07-15 ENCOUNTER — Inpatient Hospital Stay
Admission: RE | Admit: 2024-07-15 | Discharge: 2024-07-15 | Attending: Radiation Oncology | Admitting: Radiation Oncology

## 2024-07-15 DIAGNOSIS — K769 Liver disease, unspecified: Secondary | ICD-10-CM

## 2024-07-15 MED ORDER — GADOPICLENOL 0.5 MMOL/ML IV SOLN
8.0000 mL | Freq: Once | INTRAVENOUS | Status: AC | PRN
  Administered 2024-07-15: 16:00:00 8 mL via INTRAVENOUS

## 2024-07-16 ENCOUNTER — Other Ambulatory Visit (HOSPITAL_COMMUNITY): Payer: Self-pay | Admitting: Psychiatry

## 2024-07-16 DIAGNOSIS — F419 Anxiety disorder, unspecified: Secondary | ICD-10-CM

## 2024-07-16 DIAGNOSIS — F3130 Bipolar disorder, current episode depressed, mild or moderate severity, unspecified: Secondary | ICD-10-CM

## 2024-07-17 ENCOUNTER — Encounter (HOSPITAL_COMMUNITY): Payer: Self-pay | Admitting: Psychiatry

## 2024-07-17 ENCOUNTER — Telehealth (HOSPITAL_COMMUNITY): Admitting: Psychiatry

## 2024-07-17 VITALS — Wt 183.0 lb

## 2024-07-17 DIAGNOSIS — G4700 Insomnia, unspecified: Secondary | ICD-10-CM

## 2024-07-17 DIAGNOSIS — F3130 Bipolar disorder, current episode depressed, mild or moderate severity, unspecified: Secondary | ICD-10-CM

## 2024-07-17 DIAGNOSIS — F419 Anxiety disorder, unspecified: Secondary | ICD-10-CM

## 2024-07-17 MED ORDER — BUPROPION HCL ER (XL) 150 MG PO TB24: 150.0000 mg | ORAL_TABLET | Freq: Every day | ORAL | 0 refills | Status: AC

## 2024-07-17 MED ORDER — LAMOTRIGINE 150 MG PO TABS: 150.0000 mg | ORAL_TABLET | Freq: Every day | ORAL | 2 refills | Status: AC

## 2024-07-17 NOTE — Progress Notes (Signed)
 [facility] MD Virtual Progress Note   Patient Location: House Provider Location: Office   I connect with patient by video and verified that I am speaking with correct person by using two identifiers. I discussed the limitations of evaluation and management by telemedicine and the availability of in person appointments. I also discussed with the patient that there may be a patient responsible charge related to this service. The patient expressed understanding and agreed to proceed.  Douglas Hester [id number] 63 y.o.  07/17/2024 2:26 PM  History of Present Illness:  Patient is evaluated by video session.  He reported things are slowly and gradually getting better but recently he had a scan and he noticed some abnormality and he has appointment to see his cancer doctor tomorrow.  He is somewhat anxious but also reported better mental state.  He is able to handle the news if anything wrong.  His energy level is better.  He is no longer taking pain medicine.  Patient told while he was in the treatment he was given multiple antidepressant which is no longer taking it.  He was given Paxil, trazodone .  He is no longer taking these medicine and also stopped the mirtazapine .  He is taking Requip to help his restless leg.  He denies any hallucination, paranoia or any suicidal thoughts.  He had a good social network from [employer] and family members.  He is taking Lamictal  and Wellbutrin .  He denies any mania psychosis, hallucination, anger, highs or lows or any active or passive suicidal thoughts.  His energy level is fair.  His appetite is also so-so but he is overall doing better.  He lost weight since diagnosed with cancer but feels good.  He is still not back to work.  He has good support from his friends from employment.  He denies drinking or using any illegal substances.  He likes to continue Wellbutrin  and Lamictal .  He also takes melatonin to help his sleep.  Past Psychiatric  History: Saw Dr. Geoffry in past and diagnosed with bipolar and depression. Given Lamictal  and Wellbutrin .  Rozerem  did not work. PCP tried Prozac, Paxil (sexual side effects) and trazodone  (did not work). Abilify  never picked up. No h/o suicidal attempt, psychosis and inpatient treatment.    Past Medical History:  Diagnosis Date   Anxiety    Chronic GERD    Heart murmur    Hypertension    Tachycardia     Outpatient Encounter Medications as of 07/17/2024  Medication Sig   acetaminophen  (TYLENOL ) 500 MG tablet Take 500-1,000 mg by mouth 2 (two) times daily as needed (pain.).   buPROPion  (WELLBUTRIN  XL) 150 MG 24 hr tablet Take 1 tablet (150 mg total) by mouth daily.   fluconazole  (DIFLUCAN ) 10 MG/ML suspension Take 20mL today, then 10mL daily for 20 days. Ok to take by PEG tube.   ibuprofen (ADVIL) 600 MG tablet Take 600 mg by mouth.   lamoTRIgine  (LAMICTAL ) 150 MG tablet Take 1 tablet (150 mg total) by mouth daily.   Melatonin 5 MG TABS Take 5 mg by mouth at bedtime as needed (sleep).   metoCLOPramide  (REGLAN ) 5 MG/5ML solution Take 10 mLs (10 mg total) by mouth 4 (four) times daily -  before meals and at bedtime. Ok to take by tube. If you have 4 meals a day, do not take at bedtime.   metoprolol  succinate (TOPROL -XL) 25 MG 24 hr tablet Take 25 mg by mouth every evening. (Patient not taking: Reported on 06/23/2024)  Nutritional Supplements (NUTREN 1.5) LIQD 7 cartons Nutren 1.5/equivalent split over 4 feedings Flush tube with 60 ml water before and after each bolus Give additional 3.5 cups water (830 ml water) via PEG/po to meet hydration needs  Provides 2625 kcal, 119 g, 1337 ml free water (2647 ml total water). 1750 ml/day meets >95% est needs, 100% DRI   olmesartan (BENICAR) 5 MG tablet Take 5 mg by mouth daily.   polyethylene glycol (MIRALAX  / GLYCOLAX ) 17 g packet Take 17 g by mouth 2 (two) times daily.   rOPINIRole (REQUIP) 0.5 MG tablet SMARTSIG:1 Tablet(s) By Mouth Every Evening    [DISCONTINUED] buPROPion  (WELLBUTRIN  XL) 150 MG 24 hr tablet Take 1 tablet (150 mg total) by mouth daily.   [DISCONTINUED] clonazePAM  (KLONOPIN ) 0.5 MG tablet Take 1 tablet as needed for severe anxiety/panic attacks.   [DISCONTINUED] HYDROcodone -acetaminophen  (NORCO/VICODIN) 5-325 MG tablet Take 1 tablet by mouth every 6 (six) hours as needed for severe pain (pain score 7-10).   [DISCONTINUED] lamoTRIgine  (LAMICTAL ) 150 MG tablet Take 1 tablet (150 mg total) by mouth daily.   [DISCONTINUED] lidocaine  (XYLOCAINE ) 2 % solution Use as directed 15 mLs in the mouth or throat as needed for mouth pain. Mix 1 part 2%viscous lidocaine ,1part H2O.Swish and/or swallow 10ml of this mixture,15min before meals and at bedtime, up to QID (Patient not taking: Reported on 06/23/2024)   [DISCONTINUED] LORazepam  (ATIVAN ) 0.5 MG tablet Take 1 tablet (0.5 mg total) by mouth as needed for anxiety. Take 1 tablet 20 minutes prior to radiation treatment. Please have a driver given sedative effects. (Patient not taking: Reported on 06/23/2024)   [DISCONTINUED] mirtazapine  (REMERON ) 15 MG tablet Take 1 tablet (15 mg total) by mouth at bedtime.   [DISCONTINUED] PARoxetine (PAXIL) 30 MG tablet Take 30 mg by mouth.   [DISCONTINUED] promethazine  (PHENERGAN ) 25 MG tablet Take 1 tablet (25 mg total) by mouth every 6 (six) hours as needed for nausea or vomiting.   [DISCONTINUED] traZODone  (DESYREL ) 100 MG tablet Take 100 mg by mouth at bedtime as needed.   No facility-administered encounter medications on file as of 07/17/2024.    Recent Results (from the past 2160 hours)  CBC with Differential     Status: Abnormal   Collection Time: 05/10/24  9:09 AM  Result Value Ref Range   WBC 4.9 4.0 - 10.5 K/uL   RBC 4.60 4.22 - 5.81 MIL/uL   Hemoglobin 13.4 13.0 - 17.0 g/dL   HCT 59.5 60.9 - 47.9 %   MCV 87.8 80.0 - 100.0 fL   MCH 29.1 26.0 - 34.0 pg   MCHC 33.2 30.0 - 36.0 g/dL   RDW 87.3 88.4 - 84.4 %   Platelets 224 150 - 400  K/uL   nRBC 0.0 0.0 - 0.2 %   Neutrophils Relative % 77 %   Neutro Abs 3.8 1.7 - 7.7 K/uL   Lymphocytes Relative 9 %   Lymphs Abs 0.4 (L) 0.7 - 4.0 K/uL   Monocytes Relative 11 %   Monocytes Absolute 0.5 0.1 - 1.0 K/uL   Eosinophils Relative 3 %   Eosinophils Absolute 0.2 0.0 - 0.5 K/uL   Basophils Relative 0 %   Basophils Absolute 0.0 0.0 - 0.1 K/uL   Immature Granulocytes 0 %   Abs Immature Granulocytes 0.01 0.00 - 0.07 K/uL    Comment: Performed at [facility], [address] [place], [place] [address]  Comprehensive metabolic panel     Status: Abnormal   Collection Time:  05/10/24  9:09 AM  Result Value Ref Range   Sodium 139 135 - 145 mmol/L   Potassium 3.8 3.5 - 5.1 mmol/L   Chloride 103 98 - 111 mmol/L   CO2 26 22 - 32 mmol/L   Glucose, Bld 118 (H) 70 - 99 mg/dL    Comment: Glucose reference range applies only to samples taken after fasting for at least 8 hours.   BUN 12 8 - 23 mg/dL   Creatinine, Ser 9.14 0.61 - 1.24 mg/dL   Calcium 9.3 8.9 - 89.6 mg/dL   Total Protein 6.7 6.5 - 8.1 g/dL   Albumin 4.2 3.5 - 5.0 g/dL   AST 18 15 - 41 U/L   ALT 19 0 - 44 U/L   Alkaline Phosphatase 73 38 - 126 U/L   Total Bilirubin 0.3 0.0 - 1.2 mg/dL   GFR, Estimated >39 >39 mL/min    Comment: (NOTE) Calculated using the CKD-EPI Creatinine Equation (2021)    Anion gap 10 5 - 15    Comment: Performed at [facility], [address] [place], [place] [address]  Lipase, blood     Status: None   Collection Time: 05/10/24  9:09 AM  Result Value Ref Range   Lipase 24 11 - 51 U/L    Comment: Performed at [facility], [address] [place], [place] [address]  Comprehensive metabolic panel     Status: Abnormal   Collection Time: 05/17/24  5:44 PM  Result Value Ref Range   Sodium 135 135 - 145 mmol/L   Potassium 3.9 3.5 - 5.1 mmol/L   Chloride 97 (L) 98 - 111 mmol/L   CO2 26 22 - 32 mmol/L   Glucose, Bld 98 70 - 99 mg/dL    Comment: Glucose  reference range applies only to samples taken after fasting for at least 8 hours.   BUN 15 8 - 23 mg/dL   Creatinine, Ser 9.02 0.61 - 1.24 mg/dL   Calcium 9.6 8.9 - 89.6 mg/dL   Total Protein 7.0 6.5 - 8.1 g/dL   Albumin 4.4 3.5 - 5.0 g/dL   AST 18 15 - 41 U/L   ALT 20 0 - 44 U/L   Alkaline Phosphatase 76 38 - 126 U/L   Total Bilirubin 0.4 0.0 - 1.2 mg/dL   GFR, Estimated >39 >39 mL/min    Comment: (NOTE) Calculated using the CKD-EPI Creatinine Equation (2021)    Anion gap 12 5 - 15    Comment: Performed at [facility], [address] [place], [place] [address]  CBC with Differential     Status: Abnormal   Collection Time: 05/17/24  5:44 PM  Result Value Ref Range   WBC 5.9 4.0 - 10.5 K/uL   RBC 4.57 4.22 - 5.81 MIL/uL   Hemoglobin 13.2 13.0 - 17.0 g/dL   HCT 59.9 60.9 - 47.9 %   MCV 87.5 80.0 - 100.0 fL   MCH 28.9 26.0 - 34.0 pg   MCHC 33.0 30.0 - 36.0 g/dL   RDW 87.2 88.4 - 84.4 %   Platelets 205 150 - 400 K/uL   nRBC 0.0 0.0 - 0.2 %   Neutrophils Relative % 77 %   Neutro Abs 4.6 1.7 - 7.7 K/uL   Lymphocytes Relative 9 %   Lymphs Abs 0.5 (L) 0.7 - 4.0 K/uL   Monocytes Relative 11 %   Monocytes Absolute 0.6 0.1 - 1.0 K/uL   Eosinophils  Relative 2 %   Eosinophils Absolute 0.1 0.0 - 0.5 K/uL   Basophils Relative 1 %   Basophils Absolute 0.0 0.0 - 0.1 K/uL   Immature Granulocytes 0 %   Abs Immature Granulocytes 0.01 0.00 - 0.07 K/uL    Comment: Performed at [facility], [address] [place], [place] [address]  Lipase, blood     Status: None   Collection Time: 05/17/24  5:44 PM  Result Value Ref Range   Lipase 29 11 - 51 U/L    Comment: Performed at [facility], [address] [place], [place] [address]  Urinalysis, Routine w reflex microscopic -Urine, Clean Catch     Status: None   Collection Time: 07/02/24  9:15 PM  Result Value Ref Range   Color, Urine YELLOW YELLOW   APPearance CLEAR CLEAR   Specific Gravity, Urine 1.015  1.005 - 1.030   pH 6.0 5.0 - 8.0   Glucose, UA NEGATIVE NEGATIVE mg/dL   Hgb urine dipstick NEGATIVE NEGATIVE   Bilirubin Urine NEGATIVE NEGATIVE   Ketones, ur NEGATIVE NEGATIVE mg/dL   Protein, ur NEGATIVE NEGATIVE mg/dL   Nitrite NEGATIVE NEGATIVE   Leukocytes,Ua NEGATIVE NEGATIVE    Comment: Microscopic not done on urines with negative protein, blood, leukocytes, nitrite, or glucose < 500 mg/dL. Performed at [facility], [address] [place], [place] [address]   Lipase, blood     Status: None   Collection Time: 07/02/24  9:17 PM  Result Value Ref Range   Lipase 23 11 - 51 U/L    Comment: Performed at [facility], [address] [place], [place] [address]  Comprehensive metabolic panel     Status: Abnormal   Collection Time: 07/02/24  9:17 PM  Result Value Ref Range   Sodium 137 135 - 145 mmol/L   Potassium 4.1 3.5 - 5.1 mmol/L   Chloride 100 98 - 111 mmol/L   CO2 27 22 - 32 mmol/L   Glucose, Bld 129 (H) 70 - 99 mg/dL    Comment: Glucose reference range applies only to samples taken after fasting for at least 8 hours.   BUN 13 8 - 23 mg/dL   Creatinine, Ser 9.26 0.61 - 1.24 mg/dL   Calcium 9.5 8.9 - 89.6 mg/dL   Total Protein 6.9 6.5 - 8.1 g/dL   Albumin 4.0 3.5 - 5.0 g/dL   AST 20 15 - 41 U/L   ALT 26 0 - 44 U/L   Alkaline Phosphatase 92 38 - 126 U/L   Total Bilirubin 0.3 0.0 - 1.2 mg/dL   GFR, Estimated >39 >39 mL/min    Comment: (NOTE) Calculated using the CKD-EPI Creatinine Equation (2021)    Anion gap 10 5 - 15    Comment: Performed at [facility], [address] [place], [place] [address]  CBC     Status: Abnormal   Collection Time: 07/02/24  9:17 PM  Result Value Ref Range   WBC 7.5 4.0 - 10.5 K/uL   RBC 4.49 4.22 - 5.81 MIL/uL   Hemoglobin 12.9 (L) 13.0 - 17.0 g/dL   HCT 60.2 60.9 - 47.9 %   MCV 88.4 80.0 - 100.0 fL   MCH 28.7 26.0 - 34.0 pg   MCHC 32.5 30.0 - 36.0 g/dL   RDW 87.3 88.4 - 84.4 %   Platelets 214 150  - 400 K/uL   nRBC 0.0 0.0 - 0.2 %  Comment: Performed at [facility], [address] [place], [place] [address]  Glucose, capillary     Status: None   Collection Time: 07/09/24  2:41 PM  Result Value Ref Range   Glucose-Capillary 87 70 - 99 mg/dL    Comment: Glucose reference range applies only to samples taken after fasting for at least 8 hours.     Psychiatric Specialty Exam: Physical Exam  Review of Systems  Gastrointestinal:        Feeding tube  Psychiatric/Behavioral:  The patient is nervous/anxious.     Weight 183 lb (83 kg).There is no height or weight on file to calculate BMI.  General Appearance: Casual  Eye Contact:  Good  Speech:  Clear and Coherent  Volume:  Normal  Mood:  Anxious  Affect:  Appropriate  Thought Process:  Goal Directed  Orientation:  Full (Time, Place, and Person)  Thought Content:  Rumination  Suicidal Thoughts:  No  Homicidal Thoughts:  No  Memory:  Immediate;   Good Recent;   Good Remote;   Good  Judgement:  Intact  Insight:  Present  Psychomotor Activity:  Decreased  Concentration:  Concentration: Good and Attention Span: Good  Recall:  Good  Fund of Knowledge:  Good  Language:  Good  Akathisia:  No  Handed:  Right  AIMS (if indicated):     Assets:  Communication Skills Desire for Improvement Housing Resilience Social Support Transportation  ADL's:  Intact  Cognition:  WNL  Sleep:  fair       03/14/2024    8:25 AM 03/11/2024   10:29 AM 02/21/2024   11:35 AM 02/06/2024    1:28 PM 02/06/2024    1:20 PM  Depression screen PHQ 2/9  Decreased Interest 0 0 0 0 0  Down, Depressed, Hopeless 0 0 0 0 0  PHQ - 2 Score 0 0 0 0 0    Assessment/Plan: Bipolar I disorder, most recent episode depressed (HCC) - Plan: buPROPion  (WELLBUTRIN  XL) 150 MG 24 hr tablet, lamoTRIgine  (LAMICTAL ) 150 MG tablet  Insomnia, unspecified type  Anxiety - Plan: buPROPion  (WELLBUTRIN  XL) 150 MG 24 hr tablet  Patient is 63 year old male with  history of bipolar disorder, anxiety and insomnia.  Review current medication, blood work results.  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  He was given Paxil, trazodone  and pain medicine while he was getting treatment for radiation.  Patient is no longer taking these medication.  He also stopped nausea medicine.  He like to continue Wellbutrin  and Lamictal  combination which seems to be working very well.  He takes melatonin which helps his sleep.  Discontinue Paxil, trazodone , mirtazapine .  Discussed medication side effects and benefits.  Recommend to call back if is any question or any concern.  Follow-up in 3 months.   Follow Up Instructions:     I discussed the assessment and treatment plan with the patient. The patient was provided an opportunity to ask questions and all were answered. The patient agreed with the plan and demonstrated an understanding of the instructions.   The patient was advised to call back or seek an in-person evaluation if the symptoms worsen or if the condition fails to improve as anticipated.    Collaboration of Care: Other provider involved in patient's care AEB notes are available in epic to review  Patient/Guardian was advised Release of Information must be obtained prior to any record release in order to  collaborate their care with an outside provider. Patient/Guardian was advised if they have not already done so to contact the registration department to sign all necessary forms in order for us  to release information regarding their care.   Consent: Patient/Guardian gives verbal consent for treatment and assignment of benefits for services provided during this visit. Patient/Guardian expressed understanding and agreed to proceed.     Total encounter time 25 minutes which includes face-to-face time, chart reviewed, care coordination, order entry and documentation during this encounter.   Note: This document  was prepared by [employer] voice dictation technology and any errors that results from this process are unintentional.    Leni ONEIDA Client, MD 07/17/2024

## 2024-07-18 ENCOUNTER — Ambulatory Visit
Admission: RE | Admit: 2024-07-18 | Discharge: 2024-07-18 | Disposition: A | Discharge status: Home / Self Care | Source: Ambulatory Visit | Attending: Radiation Oncology | Admitting: Radiation Oncology

## 2024-07-18 ENCOUNTER — Other Ambulatory Visit: Payer: Self-pay

## 2024-07-18 VITALS — BP 121/76 | HR 89 | Temp 97.1°F | Resp 18 | Ht 69.0 in | Wt 183.0 lb

## 2024-07-18 DIAGNOSIS — C090 Malignant neoplasm of tonsillar fossa: Secondary | ICD-10-CM

## 2024-07-18 DIAGNOSIS — C787 Secondary malignant neoplasm of liver and intrahepatic bile duct: Secondary | ICD-10-CM

## 2024-07-18 NOTE — Progress Notes (Unsigned)
 Jenna Cordella LABOR, MD  Baldwin Rosella L PROCEDURE / BIOPSY REVIEW Date: 07/18/2024  Requested Biopsy site: Liver Reason for request: liver masses Imaging review: Best seen on MR  Decision: Approved Imaging modality to perform: Ultrasound Schedule with: Moderate Sedation Schedule for: Any VIR  Additional comments: @Schedulers .  Please contact me with questions, concerns, or if issue pertaining to this request arise.  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

## 2024-07-18 NOTE — Progress Notes (Signed)
 " Radiation Oncology         [phone] ________________________________  Name: Douglas Hester MRN: [id number]  Date: 07/18/2024  DOB: 1960-08-26  Follow-Up Visit Note  CC: Katina Pfeiffer, PA-C  Katina Pfeiffer, PA-C  Diagnosis and Prior Radiotherapy:    [phone]   ICD-10-CM   1. Malignant neoplasm of tonsillar fossa (HCC)  C09.0         ==========DELIVERED PLANS==========  First Treatment Date: 2024-02-20 Last Treatment Date: 2024-04-15   Plan Name: [id number] Site: Tonsil, Left Technique: IMRT Mode: Photon Dose Per Fraction: 2 Gy Prescribed Dose (Delivered / Prescribed): 70 Gy / 70 Gy Prescribed Fxs (Delivered / Prescribed): 35 / 35   Cancer Staging  Squamous cell carcinoma of left tonsil (HCC) Staging form: Pharynx - HPV-Mediated Oropharynx, AJCC 8th Edition - Clinical: Stage I (cT1, cN1, cM0, p16+) - Signed by Autumn Millman, MD on 02/06/2024  Stage 1 (cT1, N1, 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 He completed his treatment on 04/15/2024.    Pain issues, if any: None Using a feeding tube?: None Weight changes, if any: Weight has stayed stable Swallowing issues, if any: None,  Smoking or chewing tobacco? None Using fluoride toothpaste daily? Yes Last ENT visit was on: December 2025 Other notable issues, if any:  Taste buds are still altered     BP 121/76 (BP Location: Left Arm, Patient Position: Sitting)   Pulse 89   Temp (!) 97.1 F (36.2 C) (Temporal)   Resp 18   Ht 5' 9 (1.753 m)   Wt 183 lb (83 kg)   SpO2 98%   BMI 27.02 kg/m   Wt Readings from Last 3 Encounters:  07/18/24 183 lb (83 kg)  06/23/24 183 lb (83 kg)  05/10/24 188 lb 0.8 oz (85.3 kg)       ALLERGIES:  has no known allergies.  Meds: Current Outpatient Medications  Medication Sig  Dispense Refill   acetaminophen  (TYLENOL ) 500 MG tablet Take 500-1,000 mg by mouth 2 (two) times daily as needed (pain.).     buPROPion  (WELLBUTRIN  XL) 150 MG 24 hr tablet Take 1 tablet (150 mg total) by mouth daily. 90 tablet 0   fluconazole  (DIFLUCAN ) 10 MG/ML suspension Take 20mL today, then 10mL daily for 20 days. Ok to take by PEG tube. 220 mL 0   ibuprofen (ADVIL) 600 MG tablet Take 600 mg by mouth.     lamoTRIgine  (LAMICTAL ) 150 MG tablet Take 1 tablet (150 mg total) by mouth daily. 30 tablet 2   Melatonin 5 MG TABS Take 5 mg by mouth at bedtime as needed (sleep).     metoCLOPramide  (REGLAN ) 5 MG/5ML solution Take 10 mLs (10 mg total) by mouth 4 (four) times daily -  before meals and at bedtime. Ok to take by tube. If you have 4 meals a day, do not take at bedtime. 960 mL 2   metoprolol  succinate (TOPROL -XL) 25 MG 24 hr tablet Take 25 mg by mouth every evening. (Patient not taking: Reported on 06/23/2024)     Nutritional Supplements (NUTREN 1.5) LIQD 7 cartons Nutren 1.5/equivalent split over 4 feedings Flush tube with 60 ml water before and after each bolus Give additional 3.5 cups water (830 ml water) via PEG/po to meet hydration needs  Provides 2625 kcal, 119 g, 1337 ml free water (2647  ml total water). 1750 ml/day meets >95% est needs, 100% DRI     olmesartan (BENICAR) 5 MG tablet Take 5 mg by mouth daily.     polyethylene glycol (MIRALAX  / GLYCOLAX ) 17 g packet Take 17 g by mouth 2 (two) times daily. 14 each 0   rOPINIRole (REQUIP) 0.5 MG tablet SMARTSIG:1 Tablet(s) By Mouth Every Evening     No current facility-administered medications for this encounter.    Physical Findings: The patient is in no acute distress. Patient is alert and oriented. Wt Readings from Last 3 Encounters:  07/18/24 183 lb (83 kg)  06/23/24 183 lb (83 kg)  05/10/24 188 lb 0.8 oz (85.3 kg)    height is 5' 9 (1.753 m) and weight is 183 lb (83 kg). His temporal temperature is 97.1 F (36.2 C) (abnormal).  His blood pressure is 121/76 and his pulse is 89. His respiration is 18 and oxygen saturation is 98%. .  General: Alert and oriented, in no acute distress HEENT: Head is normocephalic. Extraocular movements are intact. Oropharynx is notable for uvula midline and  bilobed. No concerning lesions.   Neck: Neck is notable for no palpable adenopathy.  Skin: Skin in treatment fields consistent with satisfactory healing.  Abdomen: Soft, nontender, nondistended, with no rigidity or guarding.   Extremities: No cyanosis or edema. Lymphatics: see Neck Exam Psychiatric: Judgment and insight are intact. Affect is appropriate.   Lab Findings: Lab Results  Component Value Date   WBC 7.5 07/02/2024   HGB 12.9 (L) 07/02/2024   HCT 39.7 07/02/2024   MCV 88.4 07/02/2024   PLT 214 07/02/2024    Lab Results  Component Value Date   TSH 1.110 02/06/2024    Radiographic Findings: MR Abdomen W Wo Contrast Result Date: 07/16/2024 CLINICAL DATA:  Liver disease.  History of malignancy. EXAM: MRI ABDOMEN WITHOUT AND WITH CONTRAST TECHNIQUE: Multiplanar multisequence MR imaging of the abdomen was performed both before and after the administration of intravenous contrast. CONTRAST:  8 mL of Vueway . COMPARISON:  CT scan abdomen and pelvis from 07/03/2024 and nuclear medicine PET scan from 07/11/2024. FINDINGS: Lower chest: Unremarkable MR appearance to the lung bases. No pleural effusion. No pericardial effusion. Normal heart size. Hepatobiliary: The liver is normal in size. Noncirrhotic configuration. There are approximately 5-6, centrally cystic and peripherally T2 mildly hyperintense lesions with target like appearance on postcontrast images. The largest lesion is in the subcapsular left hepatic lobe, segment 3 measuring up to 2.3 x 2.6 cm, favored to represent metastases. The lesion exhibits central necrotic component. No significant interval change since the prior study. No intrahepatic or extrahepatic bile duct  dilatation. No choledocholithiasis. Status post cholecystectomy. Pancreas: No mass, inflammatory changes or other parenchymal abnormality identified. No main pancreatic duct dilation. Spleen:  Within normal limits in size and appearance. No focal mass. Adrenals/Urinary Tract: Unremarkable adrenal glands. No hydroureteronephrosis. No suspicious renal mass. Stomach/Bowel: There is a diverticulum arising from the third part of duodenum. Visualized portions within the abdomen are unremarkable. No disproportionate dilation of bowel loops. Unremarkable appendix. Vascular/Lymphatic: No pathologically enlarged lymph nodes identified. No abdominal aortic aneurysm demonstrated. No ascites. Other:  None. Musculoskeletal: No suspicious bone lesions identified. IMPRESSION: 1. There are approximately 5-6, centrally cystic and peripherally T2 mildly hyperintense lesions with target like appearance on postcontrast images. The largest lesion is in the subcapsular left hepatic lobe, segment 3 measuring up to 2.3 x 2.6 cm. These are favored to represent metastases. No significant interval change since the prior  study. 2. Multiple other nonacute observations, as described above. Electronically Signed   By: Ree Molt M.D.   On: 07/16/2024 15:54   NM PET Image Restag (PS) Skull Base To Thigh Result Date: 07/11/2024 EXAM: PET AND CT SKULL BASE TO MID THIGH 07/09/2024 04:27:35 PM TECHNIQUE: RADIOPHARMACEUTICAL: 9.02 mCi F-18 FDG Uptake time 60 minutes. Glucose level 87 mg/dl. Blood pool SUV 2.4. PET imaging was acquired from the base of the skull to the mid thighs. Non-contrast enhanced computed tomography was obtained for attenuation correction and anatomic localization. COMPARISON: PET dated 01/05/2024. Abdominal pelvic CT dated 07/03/2024. CLINICAL HISTORY: Head/neck cancer, monitor. FINDINGS: HEAD AND NECK: The level 2 necrotic left-sided cervical node has markedly improved to resolved. 6 mm node in this region on image 44/4  at SUV 2.6. Compare 3.0 cm and SUV 9.1 on the prior. No new cervical nodes or metabolic mucosal primary identified. CHEST: Thoracic aortic atherosclerosis. There are no hypermetabolic mediastinal, hilar or axillary lymph nodes. No hypermetabolic pulmonary activity or suspicious nodularity. ABDOMEN AND PELVIS: Development of bilobar hepatic metastasis. Exam within the posterior aspect of the segment 2-3 at 1.8 cm and SUV 11.4 on image 132/4. Within the anterior subcapsular right hepatic lobe 1.7 cm and SUV 11.8 on image 131/4. Cholecystectomy. There is no hypermetabolic activity within the adrenal glands, spleen or pancreas. There is no hypermetabolic nodal activity in the abdomen or pelvis. Physiologic activity within the gastrointestinal and genitourinary systems. BONES AND SOFT TISSUE: Degenerative changes of the right hip. There is no hypermetabolic activity to suggest osseous metastatic disease. No abnormal FDG activity localizes to the bones. No metabolically active aggressive osseous lesion. IMPRESSION: 1. Development of bilobar hypermetabolic hepatic metastasis. 2. 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 3. Aortic atherosclerosis (ICD-10 I70.0). Electronically signed by: Rockey Kilts MD 07/11/2024 11:33 AM EST RP Workstation: [id number]   CT ABDOMEN PELVIS W CONTRAST Result Date: 07/03/2024 EXAM: CT ABDOMEN AND PELVIS WITH CONTRAST 07/03/2024 12:33:32 AM TECHNIQUE: CT of the abdomen and pelvis was performed with the administration of 100 mL iohexol  (OMNIPAQUE ) 300 MG/ML solution. Multiplanar reformatted images are provided for review. Automated exposure control, iterative reconstruction, and/or weight-based adjustment of the mA/kV was utilized to reduce the radiation dose to as low as reasonably achievable. COMPARISON: Comparison with 03/31/2024. CLINICAL HISTORY: Abdominal pain, acute, nonlocalized; RLQ abdominal pain. FINDINGS: LOWER CHEST: No  acute abnormality. LIVER: New hypoattenuating lesions in the liver. For example, a 1.3 cm lesion in the medial left hepatic lobe on series 2 image 20; a 1.4 cm lesion in the right hepatic lobe near the porta hepatis on series 2 image 20; and a 1.2 cm lesion in the peripheral right hepatic lobe on series 2 image 21. GALLBLADDER AND BILE DUCTS: Cholecystectomy. No biliary ductal dilatation. SPLEEN: No acute abnormality. PANCREAS: No acute abnormality. ADRENAL GLANDS: No acute abnormality. KIDNEYS, URETERS AND BLADDER: No stones in the kidneys or ureters. No hydronephrosis. No perinephric or periureteral stranding. Urinary bladder is unremarkable. GI AND BOWEL: Stomach demonstrates no acute abnormality. There is no bowel obstruction. Normal appendix. PERITONEUM AND RETROPERITONEUM: No ascites. No free air. VASCULATURE: Aorta is normal in caliber. LYMPH NODES: No lymphadenopathy. REPRODUCTIVE ORGANS: No acute abnormality. BONES AND SOFT TISSUES: No acute osseous abnormality. No focal soft tissue abnormality. IMPRESSION: 1. New hypoattenuating liver lesions. In a patient with history of cancer, these are concerning for metastases. Hepatic protocol MRI recommended for further evaluation. 2. Otherwise no acute abnormality of the  abdomen or pelvis. 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  However he now has at least 5 lesions in the liver consistent with liver metastases.  I recommend IR guided biopsy ASAP.  Our navigator will arrange that soon and also ask for PD-L1 testing.  She will arrange follow-up with  medical oncology soon after biopsy results are available.  The patient and his family are agreeable with this plan and absorbing all of this difficult information as well as can be expected.  We briefly talked about the likelihood of systemic therapy and they have many good questions that they will ask medical oncology when they meet with them.  2) Nutritional Status: His taste buds are altered but he is managing well with oral diet Wt Readings from Last 3 Encounters:  07/18/24 183 lb (83 kg)  06/23/24 183 lb (83 kg)  05/10/24 188 lb 0.8 oz (85.3 kg)    PEG tube has been removed  3)  Swallowing: Functional.    4) Dental: Encouraged to continue regular followup with dentistry, and dental hygiene including fluoride rinses. Using fluoride toothpaste daily.   5) Thyroid  function: checking annually Lab Results  Component Value Date   TSH 1.110 02/06/2024   7 ) Follow-up as scheduled in February -  the patient was encouraged to call with any issues or questions before then.   On date of service, in total, I spent 30 minutes on this encounter. Patient was seen in person. -----------------------------------  Lauraine Golden, MD    "

## 2024-07-21 ENCOUNTER — Ambulatory Visit (INDEPENDENT_AMBULATORY_CARE_PROVIDER_SITE_OTHER): Admitting: Otolaryngology

## 2024-07-22 ENCOUNTER — Telehealth: Payer: Self-pay | Admitting: Oncology

## 2024-07-22 NOTE — Telephone Encounter (Signed)
 I left a voicemail for patient regarding scheduled lab and MD appointments on 08/08/2024 per staff message.

## 2024-08-04 ENCOUNTER — Telehealth: Payer: Self-pay

## 2024-08-04 NOTE — H&P (Signed)
 "    Chief Complaint: Patient was seen in consultation today for liver lesion concerning for metastasis of SCC of left tonsil, with consideration for biopsy.  Referring Provider(s): [name], MD    Supervising Physician: Philip Cornet  Patient Status: [name] - Out-pt  Patient is Full Code  History of Present Illness: Douglas Hester is a 64 y.o. 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  Per Dr. Charisse progress note dated 12/19:  Head and Neck Cancer Status: Patient is healing well from the effects of his radiation treatment and appears to have had a complete response in the head and neck region.  However he now has at least 5 lesions in the liver consistent with liver metastases.  I recommend IR guided biopsy ASAP.   NM PET Image restage 07/09/24: IMPRESSION: 1. Development of bilobar hypermetabolic hepatic metastasis. 2. 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 3. Aortic atherosclerosis (ICD-10 I70.0).   MR Abdomen W W/O cm on 07/15/24: IMPRESSION: 1. There are approximately 5-6, centrally cystic and peripherally T2 mildly hyperintense lesions with target like appearance on postcontrast images. The largest lesion is in the subcapsular left hepatic lobe, segment 3 measuring up to 2.3 x 2.6 cm. These are favored to represent metastases. No significant interval change since the prior study. 2. Multiple other nonacute observations, as described above.   Interventional Radiology was requested for liver lesion biopsy. Request was reviewed and approved by Dr. Jenna. Patient is scheduled for same in IR today.   Patient is alert and sitting up in bed, calm. His sister and brother-in-law are by his side. Patient is currently without any significant complaints. He is sitting up as his sciatic chronic pain is  flaring up. He is chewing on a few ice chips, as he no longer makes saliva s/p radiation therapy. Patient denies any fevers, headache, chest pain, SOB, cough, abdominal pain, nausea, vomiting or bleeding.     Past Medical History:  Diagnosis Date   Anxiety    Cancer (HCC)    squamus   Chronic GERD    Heart murmur    Hypertension    Tachycardia     Past Surgical History:  Procedure Laterality Date   CHOLECYSTECTOMY N/A 10/11/2018   Procedure: LAPAROSCOPIC CHOLECYSTECTOMY WITH INTRAOPERATIVE CHOLANGIOGRAM;  Surgeon: Curvin Deward MOULD, MD;  Location: [facility] ORS;  Service: General;  Laterality: N/A;   COLONOSCOPY  ~ 2014   done by GI in [place]. Polyps were removed. Patient not aware what type of polyps these were   DIRECT LARYNGOSCOPY N/A 01/28/2024   Procedure: LARYNGOSCOPY, DIRECT WITH BIOPSIES;  Surgeon: Tobie Eldora NOVAK, MD;  Location: [facility] OR;  Service: ENT;  Laterality: N/A;   ESOPHAGOGASTRODUODENOSCOPY  early 1990s   MD found ulcer   INGUINAL HERNIA REPAIR Left ~ 2010   with Mesh.    IR GASTROSTOMY TUBE MOD SED  03/13/2024   IR GASTROSTOMY TUBE MOD SED  04/03/2024   IR GASTROSTOMY TUBE REMOVAL  04/02/2024   IR GASTROSTOMY TUBE REMOVAL  05/28/2024   VASECTOMY      Allergies: Patient has no known allergies.  Medications: Prior to Admission medications  Medication Sig Start Date End Date Taking? Authorizing Provider  acetaminophen  (TYLENOL ) 500 MG tablet Take 500-1,000 mg by mouth 2 (two) times daily as needed (pain.).    [provider]  buPROPion  (WELLBUTRIN  XL) 150 MG 24 hr tablet Take 1 tablet (150 mg total) by mouth daily. 07/17/24   Arfeen, Leni DASEN, MD  fluconazole  (DIFLUCAN ) 10 MG/ML suspension Take 20mL today, then 10mL daily for 20 days. Ok to take by PEG tube. 03/24/24   Izell Domino, MD  ibuprofen (ADVIL) 600 MG tablet Take 600 mg by mouth. 10/19/12   [provider]  lamoTRIgine  (LAMICTAL ) 150 MG tablet Take 1 tablet (150 mg total) by mouth daily. 07/17/24    Arfeen, Leni DASEN, MD  Melatonin 5 MG TABS Take 5 mg by mouth at bedtime as needed (sleep).    [provider]  metoCLOPramide  (REGLAN ) 5 MG/5ML solution Take 10 mLs (10 mg total) by mouth 4 (four) times daily -  before meals and at bedtime. Ok to take by tube. If you have 4 meals a day, do not take at bedtime. 04/01/24   Izell Domino, MD  metoprolol  succinate (TOPROL -XL) 25 MG 24 hr tablet Take 25 mg by mouth every evening. Patient not taking: Reported on 06/23/2024 09/18/22   [provider]  Nutritional Supplements (NUTREN 1.5) LIQD 7 cartons Nutren 1.5/equivalent split over 4 feedings Flush tube with 60 ml water before and after each bolus Give additional 3.5 cups water (830 ml water) via PEG/po to meet hydration needs  Provides 2625 kcal, 119 g, 1337 ml free water (2647 ml total water). 1750 ml/day meets >95% est needs, 100% DRI 03/14/24   Izell Domino, MD  olmesartan (BENICAR) 5 MG tablet Take 5 mg by mouth daily. 05/28/24   [provider]  polyethylene glycol (MIRALAX  / GLYCOLAX ) 17 g packet Take 17 g by mouth 2 (two) times daily. 03/20/24   Regalado, Owen A, MD  rOPINIRole (REQUIP) 0.5 MG tablet SMARTSIG:1 Tablet(s) By Mouth Every Evening 06/06/24   [provider]     Family History  Problem Relation Age of Onset   Lung cancer Mother    Lymphoma Father    Valvular heart disease Sister     Social History   Socioeconomic History   Marital status: Divorced    Spouse name: Not on file   Number of children: 0   Years of education: Not on file   Highest education level: Not on file  Occupational History   Not on file  Tobacco Use   Smoking status: Never   Smokeless tobacco: Never  Vaping Use   Vaping status: Never Used  Substance and Sexual Activity   Alcohol use: Yes    Comment: rare   Drug use: No   Sexual activity: Not on file  Other Topics Concern   Not on file  Social History Narrative   Not on file   Social Drivers of Health    Tobacco Use: Low Risk (08/05/2024)   Patient History    Smoking Tobacco Use: Never    Smokeless Tobacco Use: Never    Passive Exposure: Not on file  Financial Resource Strain: Not on file  Food Insecurity: No Food Insecurity (03/18/2024)   Epic    Worried About Programme Researcher, Broadcasting/film/video in the Last Year: Never true    Ran Out of Food in the Last Year: Never true  Transportation Needs: No Transportation Needs (03/18/2024)   Epic    Lack of Transportation (Medical): No    Lack of Transportation (Non-Medical): No  Physical Activity: Not on file  Stress: Not on file  Social Connections: Not on file  Depression (EYV7-0): Low Risk (  03/14/2024)   Depression (PHQ2-9)    PHQ-2 Score: 0  Alcohol Screen: Not on file  Housing: Low Risk (03/18/2024)   Epic    Unable to Pay for Housing in the Last Year: No    Number of Times Moved in the Last Year: 0    Homeless in the Last Year: No  Utilities: Not At Risk (03/18/2024)   Epic    Threatened with loss of utilities: No  Health Literacy: Not on file     Review of Systems: A 12 point ROS discussed and pertinent positives are indicated in the HPI above.  All other systems are negative.  Vital Signs: BP 118/81   Pulse 94   Temp 98.1 F (36.7 C) (Oral)   Resp (!) 21   Ht 5' 9 (1.753 m)   Wt 170 lb (77.1 kg)   SpO2 99%   BMI 25.10 kg/m   Advance Care Plan: The advanced care place/surrogate decision maker was discussed at the time of visit and the patient did not wish to discuss or was not able to name a surrogate decision maker or provide an advance care plan.  Physical Exam Vitals reviewed.  Constitutional:      General: He is not in acute distress.    Appearance: Normal appearance.  HENT:     Mouth/Throat:     Mouth: Mucous membranes are dry.     Comments: Patient is chewing on ice chips as he no longer makes saliva, s/p head and neck radiation. Cardiovascular:     Rate and Rhythm: Normal rate and regular rhythm.     Pulses: Normal  pulses.     Heart sounds: Normal heart sounds.  Pulmonary:     Effort: Pulmonary effort is normal.     Breath sounds: Normal breath sounds.  Abdominal:     General: Abdomen is flat. There is no distension.     Palpations: Abdomen is soft.     Tenderness: There is no abdominal tenderness. There is no guarding.  Musculoskeletal:        General: Normal range of motion.     Cervical back: Normal range of motion.  Skin:    General: Skin is warm and dry.  Neurological:     Mental Status: He is alert and oriented to person, place, and time.  Psychiatric:        Mood and Affect: Mood normal.        Behavior: Behavior normal.        Thought Content: Thought content normal.        Judgment: Judgment normal.     Imaging: MR Abdomen W Wo Contrast Result Date: 07/16/2024 CLINICAL DATA:  Liver disease.  History of malignancy. EXAM: MRI ABDOMEN WITHOUT AND WITH CONTRAST TECHNIQUE: Multiplanar multisequence MR imaging of the abdomen was performed both before and after the administration of intravenous contrast. CONTRAST:  8 mL of Vueway . COMPARISON:  CT scan abdomen and pelvis from 07/03/2024 and nuclear medicine PET scan from 07/11/2024. FINDINGS: Lower chest: Unremarkable MR appearance to the lung bases. No pleural effusion. No pericardial effusion. Normal heart size. Hepatobiliary: The liver is normal in size. Noncirrhotic configuration. There are approximately 5-6, centrally cystic and peripherally T2 mildly hyperintense lesions with target like appearance on postcontrast images. The largest lesion is in the subcapsular left hepatic lobe, segment 3 measuring up to 2.3 x 2.6 cm, favored to represent metastases. The lesion exhibits central necrotic component. No significant interval change since the prior study. No  intrahepatic or extrahepatic bile duct dilatation. No choledocholithiasis. Status post cholecystectomy. Pancreas: No mass, inflammatory changes or other parenchymal abnormality identified. No  main pancreatic duct dilation. Spleen:  Within normal limits in size and appearance. No focal mass. Adrenals/Urinary Tract: Unremarkable adrenal glands. No hydroureteronephrosis. No suspicious renal mass. Stomach/Bowel: There is a diverticulum arising from the third part of duodenum. Visualized portions within the abdomen are unremarkable. No disproportionate dilation of bowel loops. Unremarkable appendix. Vascular/Lymphatic: No pathologically enlarged lymph nodes identified. No abdominal aortic aneurysm demonstrated. No ascites. Other:  None. Musculoskeletal: No suspicious bone lesions identified. IMPRESSION: 1. There are approximately 5-6, centrally cystic and peripherally T2 mildly hyperintense lesions with target like appearance on postcontrast images. The largest lesion is in the subcapsular left hepatic lobe, segment 3 measuring up to 2.3 x 2.6 cm. These are favored to represent metastases. No significant interval change since the prior study. 2. Multiple other nonacute observations, as described above. Electronically Signed   By: Ree Molt M.D.   On: 07/16/2024 15:54   NM PET Image Restag (PS) Skull Base To Thigh Result Date: 07/11/2024 EXAM: PET AND CT SKULL BASE TO MID THIGH 07/09/2024 04:27:35 PM TECHNIQUE: RADIOPHARMACEUTICAL: 9.02 mCi F-18 FDG Uptake time 60 minutes. Glucose level 87 mg/dl. Blood pool SUV 2.4. PET imaging was acquired from the base of the skull to the mid thighs. Non-contrast enhanced computed tomography was obtained for attenuation correction and anatomic localization. COMPARISON: PET dated 01/05/2024. Abdominal pelvic CT dated 07/03/2024. CLINICAL HISTORY: Head/neck cancer, monitor. FINDINGS: HEAD AND NECK: The level 2 necrotic left-sided cervical node has markedly improved to resolved. 6 mm node in this region on image 44/4 at SUV 2.6. Compare 3.0 cm and SUV 9.1 on the prior. No new cervical nodes or metabolic mucosal primary identified. CHEST: Thoracic aortic  atherosclerosis. There are no hypermetabolic mediastinal, hilar or axillary lymph nodes. No hypermetabolic pulmonary activity or suspicious nodularity. ABDOMEN AND PELVIS: Development of bilobar hepatic metastasis. Exam within the posterior aspect of the segment 2-3 at 1.8 cm and SUV 11.4 on image 132/4. Within the anterior subcapsular right hepatic lobe 1.7 cm and SUV 11.8 on image 131/4. Cholecystectomy. There is no hypermetabolic activity within the adrenal glands, spleen or pancreas. There is no hypermetabolic nodal activity in the abdomen or pelvis. Physiologic activity within the gastrointestinal and genitourinary systems. BONES AND SOFT TISSUE: Degenerative changes of the right hip. There is no hypermetabolic activity to suggest osseous metastatic disease. No abnormal FDG activity localizes to the bones. No metabolically active aggressive osseous lesion. IMPRESSION: 1. Development of bilobar hypermetabolic hepatic metastasis. 2. 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 3. Aortic atherosclerosis (ICD-10 I70.0). Electronically signed by: Rockey Kilts MD 07/11/2024 11:33 AM EST RP Workstation: [id number]    Labs:  CBC: Recent Labs    05/10/24 0909 05/17/24 1744 07/02/24 2117 08/05/24 0958  WBC 4.9 5.9 7.5 9.2  HGB 13.4 13.2 12.9* 13.2  HCT 40.4 40.0 39.7 41.2  PLT 224 205 214 278    COAGS: Recent Labs    03/13/24 0954 04/03/24 0915 08/05/24 0958  INR 1.0 1.0 1.0    BMP: Recent Labs    04/16/24 1115 05/10/24 0909 05/17/24 1744 07/02/24 2117  NA 139 139 135 137  K 4.3 3.8 3.9 4.1  CL 105 103 97* 100  CO2 30 26 26 27   GLUCOSE 140* 118* 98 129*  BUN 16 12 15 13   CALCIUM 9.4 9.3 9.6 9.5  CREATININE 0.99 0.85 0.97 0.73  GFRNONAA >60 >60 >60 >60    LIVER FUNCTION TESTS: Recent Labs    04/16/24 1115 05/10/24 0909 05/17/24 1744 07/02/24 2117  BILITOT 0.6 0.3 0.4 0.3  AST 18 18 18 20   ALT 23 19 20 26   ALKPHOS 72  73 76 92  PROT 7.2 6.7 7.0 6.9  ALBUMIN 4.2 4.2 4.4 4.0    TUMOR MARKERS: No results for input(s): AFPTM, CEA, CA199, CHROMGRNA in the last 8760 hours.  Assessment and Plan: Per Dr. Charisse progress note dated 12/19:  Head and Neck Cancer Status: Patient is healing well from the effects of his radiation treatment and appears to have had a complete response in the head and neck region.  However he now has at least 5 lesions in the liver consistent with liver metastases.  I recommend IR guided biopsy ASAP.   Patient presents for scheduled liver lesion biopsy in IR today.  Patient has been NPO since midnight.  All labs and medications are within acceptable parameters.  No known allergies on file.  Risks and benefits of liver lesion was discussed with the patient and/or patient's family including, but not limited to bleeding, infection, damage to adjacent structures or low yield requiring additional tests.  All of the questions were answered and there is agreement to proceed.  Consent signed and in chart.     Thank you for allowing our service to participate in Douglas Hester 's care.  Electronically Signed: Carlin DELENA Griffon, PA-C   08/05/2024, 10:54 AM      I spent a total of 30 Minutes in face to face in clinical consultation, greater than 50% of which was counseling/coordinating care for liver lesion concerning for metastasis of SCC of left tonsil, with consideration for biopsy.   "

## 2024-08-04 NOTE — Telephone Encounter (Signed)
 Patient for US  guided Liver Biopsy on Tues 08/05/24, I called and spoke with the patient on the phone and gave pre-procedure instructions. Pt was made aware to be here at 10a, NPO after MN prior to procedure as well as driver post procedure/recovery/discharge. Pt stated understanding.  Called 08/04/24

## 2024-08-05 ENCOUNTER — Ambulatory Visit
Admission: RE | Admit: 2024-08-05 | Discharge: 2024-08-05 | Disposition: A | Discharge status: Home / Self Care | Source: Ambulatory Visit | Attending: Radiation Oncology | Admitting: Radiation Oncology

## 2024-08-05 VITALS — BP 109/72 | HR 85 | Temp 98.1°F | Resp 21 | Ht 69.0 in | Wt 170.0 lb

## 2024-08-05 DIAGNOSIS — I10 Essential (primary) hypertension: Secondary | ICD-10-CM | POA: Diagnosis not present

## 2024-08-05 DIAGNOSIS — Z923 Personal history of irradiation: Secondary | ICD-10-CM | POA: Insufficient documentation

## 2024-08-05 DIAGNOSIS — C099 Malignant neoplasm of tonsil, unspecified: Secondary | ICD-10-CM | POA: Diagnosis not present

## 2024-08-05 DIAGNOSIS — R7989 Other specified abnormal findings of blood chemistry: Secondary | ICD-10-CM | POA: Insufficient documentation

## 2024-08-05 DIAGNOSIS — K219 Gastro-esophageal reflux disease without esophagitis: Secondary | ICD-10-CM | POA: Insufficient documentation

## 2024-08-05 DIAGNOSIS — F419 Anxiety disorder, unspecified: Secondary | ICD-10-CM | POA: Insufficient documentation

## 2024-08-05 DIAGNOSIS — Z01818 Encounter for other preprocedural examination: Secondary | ICD-10-CM

## 2024-08-05 DIAGNOSIS — C787 Secondary malignant neoplasm of liver and intrahepatic bile duct: Secondary | ICD-10-CM | POA: Insufficient documentation

## 2024-08-05 HISTORY — DX: Malignant (primary) neoplasm, unspecified: C80.1

## 2024-08-05 LAB — CBC
HCT: 41.2 % (ref 39.0–52.0)
Hemoglobin: 13.2 g/dL (ref 13.0–17.0)
MCH: 28.7 pg (ref 26.0–34.0)
MCHC: 32 g/dL (ref 30.0–36.0)
MCV: 89.6 fL (ref 80.0–100.0)
Platelets: 278 K/uL (ref 150–400)
RBC: 4.6 MIL/uL (ref 4.22–5.81)
RDW: 12.5 % (ref 11.5–15.5)
WBC: 9.2 K/uL (ref 4.0–10.5)
nRBC: 0 % (ref 0.0–0.2)

## 2024-08-05 LAB — PROTIME-INR
INR: 1 (ref 0.8–1.2)
Prothrombin Time: 13.6 s (ref 11.4–15.2)

## 2024-08-05 MED ORDER — MIDAZOLAM HCL 5 MG/5ML IJ SOLN
INTRAMUSCULAR | Status: AC | PRN
  Administered 2024-08-05: 1 mg via INTRAVENOUS
  Administered 2024-08-05 (×2): .5 mg via INTRAVENOUS

## 2024-08-05 MED ORDER — LIDOCAINE HCL (PF) 1 % IJ SOLN
7.0000 mL | Freq: Once | INTRAMUSCULAR | Status: AC
  Administered 2024-08-05: 7 mL

## 2024-08-05 MED ORDER — ACETAMINOPHEN 500 MG PO TABS
1000.0000 mg | ORAL_TABLET | Freq: Once | ORAL | Status: AC
  Administered 2024-08-05: 1000 mg via ORAL

## 2024-08-05 MED ORDER — SODIUM CHLORIDE 0.9 % IV SOLN
INTRAVENOUS | Status: DC
  Administered 2024-08-05: 250 mL via INTRAVENOUS

## 2024-08-05 MED ORDER — FENTANYL CITRATE (PF) 100 MCG/2ML IJ SOLN
INTRAMUSCULAR | Status: AC
  Filled 2024-08-05: qty 2

## 2024-08-05 MED ORDER — MIDAZOLAM HCL 2 MG/2ML IJ SOLN
INTRAMUSCULAR | Status: AC
  Filled 2024-08-05: qty 2

## 2024-08-05 MED ORDER — ACETAMINOPHEN 500 MG PO TABS
ORAL_TABLET | ORAL | Status: AC
  Filled 2024-08-05: qty 2

## 2024-08-05 MED ORDER — FENTANYL CITRATE (PF) 100 MCG/2ML IJ SOLN
INTRAMUSCULAR | Status: AC | PRN
  Administered 2024-08-05: 25 ug via INTRAVENOUS
  Administered 2024-08-05: 50 ug via INTRAVENOUS
  Administered 2024-08-05: 25 ug via INTRAVENOUS

## 2024-08-05 NOTE — Discharge Instructions (Signed)
 Liver Biopsy, Care After These instructions give you information about how to care for yourself after your procedure. Your health care provider may also give you more specific instructions. If you have problems or questions, contact your health care provider. What can I expect after the procedure? After your procedure, it is common to have: Pain and soreness in the area where the biopsy was done. Bruising around the area where the biopsy was done. Sleepiness and fatigue for 1-2 days. Follow these instructions at home: Medicines Take over-the-counter and prescription medicines only as told by your health care provider. If you were prescribed an antibiotic medicine, take it as told by your health care provider. Do not stop taking the antibiotic even if you start to feel better. Do not take medicines such as aspirin and ibuprofen unless your health care provider tells you to take them. These medicines thin your blood and can increase the risk of bleeding. If you are taking prescription pain medicine, take actions to prevent or treat constipation. Your health care provider may recommend that you: Drink enough fluid to keep your urine pale yellow. Eat foods that are high in fiber, such as fresh fruits and vegetables, whole grains, and beans. Limit foods that are high in fat and processed sugars, such as fried or sweet foods. Take an over-the-counter or prescription medicine for constipation. Incision care Wash your hands with soap and water before you change your bandage (dressing). If soap and water are not available, use hand sanitizer. Change your bandage tomorrow after you shower and then remove the next day. Check your incision area every day for signs of infection. Check for: Redness, swelling, or pain. Fluid or blood. Warmth. Pus or a bad smell. You may shower tomorrow No lifting more than 5 lbs for 3 days Return to your normal activities as told by your health care provider.  Do not  drive or use heavy machinery for 24hrs or while taking prescription pain medicine. Do not play contact sports for 2 weeks after the procedure. General instructions  Do not drink alcohol in the first week after the procedure. Have someone stay with you for at least 24 hours after the procedure. It is your responsibility to obtain your test results. Ask your health care provider, or the department that is doing the test: When will my results be ready? How will I get my results? What are my treatment options? What other tests do I need? What are my next steps? Keep all follow-up visits as told by your health care provider. This is important. Contact a health care provider if: You have increased bleeding from an incision, resulting in more than a small spot of blood. You have redness, swelling, or increasing pain in any incisions. You notice a discharge or a bad smell coming from any of your incisions. You have a fever or chills. Get help right away if: You develop swelling, bloating, or pain in your abdomen. You become dizzy or faint. You develop a rash. You have nausea or you vomit. You faint, or you have shortness of breath or difficulty breathing. You develop chest pain. You have problems with your speech or vision. You have trouble with your balance or moving your arms or legs. Summary After the liver biopsy, it is common to have pain, soreness, and bruising in the area, as well as sleepiness and fatigue. Take over-the-counter and prescription medicines only as told by your health care provider. Follow instructions from your health care provider about how  to care for your incision. Check the incision area daily for signs of infection. This information is not intended to replace advice given to you by your health care provider. Make sure you discuss any questions you have with your health care provider. Document Released: 02/03/2005 Document Revised: 09/09/2018 Document Reviewed:  07/27/2017 Elsevier Patient Education  2020 [employer].

## 2024-08-05 NOTE — Procedures (Signed)
 Interventional Radiology Procedure:   Indications: Left tonsil cancer with liver lesions  Procedure: US  guided liver lesion biopsy  Findings: 3 core biopsies from a right hepatic lesion. Gelfoam injected along biopsy tract.    Complications: None     EBL: Minimal  Plan: Bedrest 2 hours   Maurya Nethery R. Philip, MD  Pager: [phone]

## 2024-08-05 NOTE — Progress Notes (Signed)
 Patient clinically stable post US  liver biopsy per Dr Philip, tolerated well. Vitals stable post procedure. Received Versed  2 mg along with Fentanyl  100 mcg IV for procedure. Denies complaints of pain immediately post procedure.. report given to Jerel Cashing Rn post procedure/specials/16

## 2024-08-07 LAB — SURGICAL PATHOLOGY

## 2024-08-08 ENCOUNTER — Other Ambulatory Visit: Payer: Self-pay | Admitting: Oncology

## 2024-08-08 ENCOUNTER — Encounter: Payer: Self-pay | Admitting: Oncology

## 2024-08-08 ENCOUNTER — Other Ambulatory Visit: Payer: Self-pay

## 2024-08-08 ENCOUNTER — Inpatient Hospital Stay

## 2024-08-08 ENCOUNTER — Other Ambulatory Visit: Payer: Self-pay | Admitting: Pharmacist

## 2024-08-08 ENCOUNTER — Inpatient Hospital Stay: Attending: Radiation Oncology | Admitting: Oncology

## 2024-08-08 VITALS — BP 98/62 | HR 104 | Temp 98.3°F | Resp 17 | Ht 69.0 in | Wt 181.9 lb

## 2024-08-08 DIAGNOSIS — K219 Gastro-esophageal reflux disease without esophagitis: Secondary | ICD-10-CM | POA: Insufficient documentation

## 2024-08-08 DIAGNOSIS — R432 Parageusia: Secondary | ICD-10-CM | POA: Insufficient documentation

## 2024-08-08 DIAGNOSIS — C099 Malignant neoplasm of tonsil, unspecified: Secondary | ICD-10-CM

## 2024-08-08 DIAGNOSIS — Z452 Encounter for adjustment and management of vascular access device: Secondary | ICD-10-CM | POA: Insufficient documentation

## 2024-08-08 DIAGNOSIS — E46 Unspecified protein-calorie malnutrition: Secondary | ICD-10-CM | POA: Insufficient documentation

## 2024-08-08 DIAGNOSIS — C4442 Squamous cell carcinoma of skin of scalp and neck: Secondary | ICD-10-CM | POA: Insufficient documentation

## 2024-08-08 DIAGNOSIS — K117 Disturbances of salivary secretion: Secondary | ICD-10-CM | POA: Insufficient documentation

## 2024-08-08 DIAGNOSIS — C787 Secondary malignant neoplasm of liver and intrahepatic bile duct: Secondary | ICD-10-CM

## 2024-08-08 DIAGNOSIS — Z79899 Other long term (current) drug therapy: Secondary | ICD-10-CM | POA: Insufficient documentation

## 2024-08-08 DIAGNOSIS — Z5112 Encounter for antineoplastic immunotherapy: Secondary | ICD-10-CM | POA: Insufficient documentation

## 2024-08-08 DIAGNOSIS — Z79620 Long term (current) use of immunosuppressive biologic: Secondary | ICD-10-CM | POA: Insufficient documentation

## 2024-08-08 DIAGNOSIS — C770 Secondary and unspecified malignant neoplasm of lymph nodes of head, face and neck: Secondary | ICD-10-CM | POA: Insufficient documentation

## 2024-08-08 MED ORDER — OMEPRAZOLE 40 MG PO CPDR: 40.0000 mg | DELAYED_RELEASE_CAPSULE | Freq: Every day | ORAL | 3 refills | Status: AC

## 2024-08-08 MED ORDER — PANTOPRAZOLE SODIUM 40 MG PO TBEC: 40.0000 mg | DELAYED_RELEASE_TABLET | Freq: Every day | ORAL | 3 refills | Status: DC

## 2024-08-08 NOTE — Progress Notes (Signed)
 START ON PATHWAY REGIMEN - Head and Neck     A cycle is every 21 days:     Carboplatin      Fluorouracil      Pembrolizumab   **Always confirm dose/schedule in your pharmacy ordering system**  Patient Characteristics: Oropharynx, HPV Positive, Metastatic, First Line, No Prior Platinum-Based Chemoradiation within 6 Months, PD-L1 Expression Negative (CPS < 1) / Unknown Disease Classification: Oropharynx HPV Status: Positive (+) Therapeutic Status: Metastatic Disease Line of Therapy: First Line Prior Platinum Status: No Prior Platinum-Based Chemoradiation within 6 Months PD-L1 Expression Status: Awaiting Test Results Intent of Therapy: Non-Curative / Palliative Intent, Discussed with Patient

## 2024-08-08 NOTE — Progress Notes (Signed)
 "  [facility]  ONCOLOGY CLINIC PROGRESS NOTE   Patient Care Team: Katina Pfeiffer, PA-C as PCP - General (Family Medicine) Tobie Eldora NOVAK, MD as Consulting Physician (Otolaryngology) Izell Domino, MD as Attending Physician (Radiation Oncology) Malmfelt, Delon CROME, RN as Oncology Nurse Navigator  PATIENT NAME: Douglas Hester   MR#: [id number] DOB: 1961-01-25  Date of visit: 08/08/2024   ASSESSMENT & PLAN:   Briscoe Daniello is a 64 y.o. 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  Because of stage I disease, plan made to proceed with definitive radiation therapy only.  Unfortunately he had recurrence of disease with liver metastatic disease was noted on CT abdomen and pelvis on 07/03/2024, later biopsy-proven.  Stage IV disease.  Squamous cell carcinoma of left tonsil [facility]) Please review oncology history for additional details and timeline of events.  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  He had a CT abdomen and pelvis on 03/31/2024 for complaints of abdominal pain which showed no acute issues, especially no liver lesions.  Patient presented to the ED on 07/02/2024 with complaints of right-sided abdominal pain when lying down.  CT abdomen and pelvis on 07/03/2024 showed new hypoattenuating liver lesions, largest measuring 1.3 cm, concerning for liver metastatic disease.  Restaging PET scan on 07/09/2024 and contrast-enhanced MRI on 07/15/2024 revealed five to six hepatic lesions, the largest measuring approximately one inch in the left lobe, with no evidence of disease in the head and  neck or lungs.   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 Additional molecular testing, including PDL1, is pending.  No evidence of disease in the head and neck or lungs post-radiation. Disease is incurable but treatable, with the goal of remission and disease control.   Prognosis is guarded, with risk of further progression and potential hepatic failure if untreated.   Combination chemotherapy and immunotherapy is recommended based on age and functional status, with flexibility for dose adjustments to maintain quality of life. Immunotherapy monotherapy may be considered if chemotherapy is not tolerated or if molecular testing supports this approach.   Induction therapy risks include fatigue, nausea, dysgeusia, and anorexia, particularly during the first week of each cycle. Maintenance immunotherapy is generally well tolerated. Remission and prevention of further spread is anticipated, but cure is not expected due to micrometastatic disease.  - Arranged portacath placement for chemotherapy and immunotherapy administration. - Scheduled education session regarding chemotherapy, immunotherapy, side effects, and infusion process. - Planned induction regimen with carboplatin, 5-FU, and pembrolizumab (Keytruda) every three weeks for six cycles. - Planned transition to maintenance pembrolizumab every three weeks, with possible extension to every six weeks after initial cycles. - Planned interim PET scan after three to four cycles to assess response. - Ordered ongoing PD-L1 testing to determine eligibility for immunotherapy monotherapy if indicated. - Expedited scheduling of port placement and treatment initiation. - Provided anticipatory guidance regarding expected side effects (fatigue, nausea, dysgeusia, anorexia) during induction. - Discussed dose adjustments and supportive care to maintain quality of life during treatment. - Discussed  prognosis and goals of care, emphasizing remission and disease control rather than  cure. - Provided conditional plan for immunotherapy monotherapy if chemotherapy is not tolerated or if PD-L1 testing supports this approach. - Planned ongoing monitoring of hepatic function and disease progression during treatment. - Provided anticipatory guidance regarding work and functional status during induction and maintenance phases. - Arranged for nurse to coordinate scheduling and education session next week. - Planned to reassess and adjust treatment based on response and tolerability, with flexibility for dose modifications as needed. - Provided education regarding portacath device for treatment administration. - Planned follow-up in approximately two weeks, pending port placement and education scheduling.  Plan is to proceed with palliative systemic chemoimmunotherapy with carboplatin, 5-FU and pembrolizumab as per NCCN guidelines.  Plan for 6 cycles every 3 weeks followed by maintenance treatments with pembrolizumab.  Our nurse navigator will arrange appointments accordingly.  Protein-calorie malnutrition At risk for protein-calorie malnutrition due to decreased appetite, minimal oral intake, dysgeusia, and xerostomia, resulting in reliance on nutritional supplements and soups. Cancer and treatment-related side effects contribute to nutritional compromise. - Referred to nutritionist for assessment and support. - Provided prescription for nutritional supplements (Ensure) and coordinated with nurse to obtain supplies. - Assisted with insurance coverage for nutritional supplements. - Planned ongoing monitoring of nutritional status and weight during treatment.  Gastroesophageal reflux disease Experiencing heartburn, likely exacerbated by decreased oral intake and cancer-related factors. - Prescribed pantoprazole , but it was not covered by his insurance.  Hence prescribed omeprazole .  I reviewed lab  results and outside records for this visit and discussed relevant results with the patient. Diagnosis, plan of care and treatment options were also discussed in detail with the patient. Opportunity provided to ask questions and answers provided to his apparent satisfaction. Provided instructions to call our clinic with any problems, questions or concerns prior to return visit. I recommended to continue follow-up with PCP and sub-specialists. He verbalized understanding and agreed with the plan.   NCCN guidelines have been consulted in the planning of this patients care.  I spent a total of 50 minutes during this encounter with the patient including review of chart and various tests results, discussions about plan of care and coordination of care plan.   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  Because of stage I disease, plan made to proceed with definitive radiation therapy only.  Unfortunately he had recurrence of disease with liver metastatic disease was noted on CT abdomen and pelvis on 07/03/2024, later biopsy-proven.  Stage IV disease.  Current Treatment: Plan for palliative systemic treatments with carboplatin, 5-FU, pembrolizumab.  INTERVAL HISTORY:    Discussed the use of AI scribe software for clinical note transcription with the patient, who gave verbal consent to proceed.  History of Present Illness Emonte Dieujuste is a 64 year old male with previously treated stage I head and neck squamous cell carcinoma who presents for oncology follow-up after new diagnosis of stage IV disease with biopsy-proven liver  metastases.  He was initially diagnosed with stage I head and neck squamous cell carcinoma and underwent definitive radiation therapy.  Patient presented to the ED on 07/02/2024 with complaints of right-sided abdominal pain when lying down.  CT abdomen  and pelvis on 07/03/2024 showed new hypoattenuating liver lesions, largest measuring 1.3 cm, concerning for liver metastatic disease.  Restaging PET scan on 07/09/2024 and contrast-enhanced MRI on 07/15/2024 revealed five to six hepatic lesions, the largest measuring approximately one inch in the left lobe, with no evidence of disease in the head and neck or lungs. Biopsy of a liver lesion confirmed metastatic squamous cell carcinoma, positive for P40. Additional molecular testing, including PDL1, is pending.  He currently experiences significant decreased appetite, minimal oral intake, persistent dysgeusia, and xerostomia. He relies on Ensure and a limited variety of soups for nutrition, with family members purchasing Ensure due to insurance coverage issues. He has not reported major weight loss, but oral intake remains poor. He also has ongoing heartburn, managed with pantoprazole .     I have reviewed the past medical history, past surgical history, social history and family history with the patient and they are unchanged from previous note.  HISTORY OF PRESENT ILLNESS:   ONCOLOGY HISTORY:   He presented to the ED on 01/03/24 with complains of headaches and an enlarged lymph node to his left neck. An Ultrasound was preformed at that time showing a left submandibular mass suspicious for a necrotic lymph node. He then underwent a CT soft tissue neck to further investigate his symptoms. Scans showed a necrotic 2.6 cm left level II mass suspicious for a conglomerate nodal metastasis. CT chest and head were negative for any abnormalities.    Subsequently, the patient was referred to [name] on 01/04/24 who suggested undergoing a core biopsy of the left  neck mass to further investigate his symptoms. Patient underwent procedure on 01/14/24. Surgical pathology indicated moderately differentiated squamous cell carcinoma. Immunohistochemical stain for p16 shows strong blocklike positivity, EBER ISH is negative.    Patient underwent direct laryngoscope under the care of [name] on 01/17/2024 which revealed no obvious abnormalities, or signs of disease primary.    PET scan performed on 01/25/24 re-demonstrated the necrotic left-sided level 2 lymph node measuring up to 3 cm and symmetric upper pharyngeal uptake extending to the left side of the vallecular. No additional areas of abnormal radiotracer uptake at this time to suggest additional areas of disease.   Patient underwent a laryngoscopy with lingual tonsil biopsy on 01/28/24 under the care of [name].  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  Right lingual tonsil showed focal surface and crypt high-grade dysplasia.  Negative for invasion.  Left tonsillar fossa biopsies were negative.   cT1,cN1,cM0,p16+, Stage I disease.    In the CT of the neck, lymph node measured 2.6 cm.  On the PET scan, it seemed to be measuring up to 3 cm.   Reviewed NCCN guidelines and discussed management options with the patient.   He was not keen on pursuing chemotherapy concurrently with radiation.  He was agreeable to proceeding with definitive radiation therapy alone at this time.  His case was discussed in multidisciplinary ENT conference on 02/13/2024 for consensus opinion and decision made to proceed with definitive radiation therapy alone, which he completed on 04/15/2024 and went into remission.  Patient presented to the ED on 07/02/2024 with complaints of right-sided abdominal pain when lying down.  CT abdomen and pelvis on 07/03/2024 showed new hypoattenuating liver lesions, largest measuring 1.3 cm, concerning for  liver metastatic disease.  Restaging PET scan on 07/09/2024 and contrast-enhanced MRI on 07/15/2024 revealed five to six  hepatic lesions, the largest measuring approximately one inch in the left lobe, with no evidence of disease in the head and neck or lungs.   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 Additional molecular testing, including PDL1, is pending.  Plan is to proceed with palliative systemic chemoimmunotherapy with carboplatin, 5-FU and pembrolizumab as per NCCN guidelines.  Plan for 6 cycles every 3 weeks followed by maintenance treatments with pembrolizumab.  Oncology History  Squamous cell carcinoma of left tonsil (HCC) (Resolved)  01/28/2024 Initial Diagnosis   Squamous cell carcinoma of left tonsil (HCC)   01/29/2024 Cancer Staging   Staging form: Cervical Lymph Nodes and Unknown Primary Tumors of the Head and Neck, AJCC 8th Edition - Clinical: Stage III (cT0, cN1, cM0) - Signed by Autumn Millman, MD on 02/06/2024   Squamous cell carcinoma of left tonsil (HCC)  02/06/2024 Initial Diagnosis   Squamous cell carcinoma of left tonsil (HCC)   02/06/2024 Cancer Staging   Staging form: Pharynx - HPV-Mediated Oropharynx, AJCC 8th Edition - Clinical: Stage I (cT1, cN1, cM0, p16+) - Signed by Autumn Millman, MD on 02/06/2024   08/08/2024 Cancer Staging   Staging form: Pharynx - HPV-Mediated Oropharynx, AJCC 8th Edition - Pathologic: Stage IV (rcT0, rcN0, rpM1, p16+) - Signed by Autumn Millman, MD on 08/08/2024 Stage prefix: Recurrence   08/22/2024 -  Chemotherapy   Patient is on Treatment Plan : HEAD/NECK Pembrolizumab (200) D1 + Carboplatin (5) D1 + 5FU (1000) IVCI D1-4 q21d x 6 cycles / Pembrolizumab (200) D1 q21d         REVIEW OF SYSTEMS:   Review of Systems - Oncology  All other pertinent systems were reviewed with the patient and are negative.  ALLERGIES: He has no known allergies.  MEDICATIONS:  Current Outpatient  Medications  Medication Sig Dispense Refill   omeprazole  (PRILOSEC) 40 MG capsule Take 1 capsule (40 mg total) by mouth daily. 30 capsule 3   acetaminophen  (TYLENOL ) 500 MG tablet Take 500-1,000 mg by mouth 2 (two) times daily as needed (pain.).     buPROPion  (WELLBUTRIN  XL) 150 MG 24 hr tablet Take 1 tablet (150 mg total) by mouth daily. 90 tablet 0   fluconazole  (DIFLUCAN ) 10 MG/ML suspension Take 20mL today, then 10mL daily for 20 days. Ok to take by PEG tube. 220 mL 0   ibuprofen (ADVIL) 600 MG tablet Take 600 mg by mouth.     lamoTRIgine  (LAMICTAL ) 150 MG tablet Take 1 tablet (150 mg total) by mouth daily. 30 tablet 2   Melatonin 5 MG TABS Take 5 mg by mouth at bedtime as needed (sleep).     metoCLOPramide  (REGLAN ) 5 MG/5ML solution Take 10 mLs (10 mg total) by mouth 4 (four) times daily -  before meals and at bedtime. Ok to take by tube. If you have 4 meals a day, do not take at bedtime. 960 mL 2   metoprolol  succinate (TOPROL -XL) 25 MG 24 hr tablet Take 25 mg by mouth every evening.     Nutritional Supplements (NUTREN 1.5) LIQD 7 cartons Nutren 1.5/equivalent split over 4 feedings Flush tube with 60 ml water before and after each bolus Give additional 3.5 cups water (830 ml water) via PEG/po to meet hydration needs  Provides 2625 kcal, 119 g, 1337 ml free water (2647 ml total water). 1750 ml/day meets >95% est needs, 100% DRI     olmesartan (BENICAR) 5 MG tablet Take 5 mg by mouth daily. (Patient  not taking: Reported on 08/05/2024)     polyethylene glycol (MIRALAX  / GLYCOLAX ) 17 g packet Take 17 g by mouth 2 (two) times daily. 14 each 0   rOPINIRole (REQUIP) 0.5 MG tablet SMARTSIG:1 Tablet(s) By Mouth Every Evening     No current facility-administered medications for this visit.     VITALS:   Blood pressure 98/62, pulse (!) 104, temperature 98.3 F (36.8 C), temperature source Temporal, resp. rate 17, height 5' 9 (1.753 m), weight 181 lb 14.4 oz (82.5 kg), SpO2 100%.  Wt Readings  from Last 3 Encounters:  08/08/24 181 lb 14.4 oz (82.5 kg)  08/05/24 170 lb (77.1 kg)  07/18/24 183 lb (83 kg)    Body mass index is 26.86 kg/m.    Onc Performance Status - 08/08/24 1400       ECOG Perf Status   ECOG Perf Status Restricted in physically strenuous activity but ambulatory and able to carry out work of a light or sedentary nature, e.g., light house work, office work      KPS SCALE   KPS % SCORE Able to carry on normal activity, minor s/s of disease          PHYSICAL EXAM:   Physical Exam Constitutional:      General: He is not in acute distress.    Appearance: Normal appearance.  HENT:     Head: Normocephalic and atraumatic.  Eyes:     Conjunctiva/sclera: Conjunctivae normal.  Cardiovascular:     Rate and Rhythm: Normal rate and regular rhythm.  Pulmonary:     Effort: Pulmonary effort is normal. No respiratory distress.  Abdominal:     General: There is no distension.  Neurological:     General: No focal deficit present.     Mental Status: He is alert and oriented to person, place, and time.  Psychiatric:        Mood and Affect: Mood normal.        Behavior: Behavior normal.     LABORATORY DATA:   I have reviewed the data as listed.  No results found for any visits on 08/08/24.    RADIOGRAPHIC STUDIES:  I have personally reviewed the radiological images as listed and agree with the findings in the report.  US  BIOPSY (LIVER) INDICATION: 64 year old with squamous cell carcinoma of the left tonsil. Patient has multiple suspicious liver lesions. Tissue diagnosis is needed.  EXAM: ULTRASOUND-GUIDED LIVER LESION BIOPSY  MEDICATIONS: Moderate sedation  ANESTHESIA/SEDATION: Moderate (conscious) sedation was employed during this procedure. A total of Versed  2 mg and Fentanyl  100 mcg was administered intravenously by the radiology nurse.  Total intra-service moderate Sedation Time: 26 minutes. The patient's level of consciousness and  vital signs were monitored continuously by radiology nursing throughout the procedure under my direct supervision.  FLUOROSCOPY TIME:  None  COMPLICATIONS: None immediate.  PROCEDURE: Informed written consent was obtained from the patient after a thorough discussion of the procedural risks, benefits and alternatives. All questions were addressed. A timeout was performed prior to the initiation of the procedure.  Liver was evaluated ultrasound. A right hepatic lesion was identified and targeted. The right side of the abdomen was prepped with chlorhexidine  and sterile field was created. Skin was anesthetized using 1% lidocaine . Small incision was made. Using ultrasound guidance, 17 gauge coaxial needle was directed into the right hepatic lesion. Total of 3 core biopsies were performed with an 18 gauge core device. Three adequate specimens obtained and placed in formalin. Gel-Foam slurry was  injected as the 17 gauge needle was removed. Bandage placed over the puncture site.  FINDINGS: Subtle heterogeneous liver lesions. Partially necrotic or cystic lesion in the right hepatic lobe was targeted. Biopsy needle confirmed within the lesion. No immediate bleeding or hematoma formation.  IMPRESSION: Successful ultrasound-guided core biopsies from a right hepatic lesion.  Electronically Signed   By: Juliene Balder M.D.   On: 08/05/2024 12:46    CODE STATUS:  Code Status History     Date Active Date Inactive Code Status Order ID Comments User Context   08/05/2024 1204 08/06/2024 0528 Full Code [id number]  Balder Juliene, MD HOV   04/03/2024 1148 04/04/2024 0519 Full Code [id number]  Jennefer Ester PARAS, MD HOV   03/18/2024 1614 03/20/2024 1919 Full Code [id number]  Zella Katha HERO, MD ED   03/13/2024 1336 03/14/2024 0517 Full Code [id number]  Luverne Aran, MD HOV   10/11/2018 1716 10/12/2018 1528 Full Code [id number]  Curvin Deward MOULD, MD Inpatient   04/29/2017 2357 05/01/2017 2012 Full Code [id number]  Luke Agent, MD Inpatient    Questions for Most Recent Historical Code Status (Order [id number])     Question Answer   By: Consent: discussion documented in EHR            No orders of the defined types were placed in this encounter.    Future Appointments  Date Time Provider Department Center  08/13/2024  4:00 PM [facility] CHEMO EDU [facility] None  08/15/2024 12:30 PM [facility] ROOM [facility] None  08/15/2024  2:30 PM [facility] 1 [facility] [place]  09/02/2024  1:45 PM Tobie Eldora NOVAK, MD [facility] None  09/23/2024 10:00 AM Izell Domino, MD [facility] None    This document was completed utilizing speech recognition software. Grammatical errors, random word insertions, pronoun errors, and incomplete sentences are an occasional consequence of this system due to software limitations, ambient noise, and hardware issues. Any formal questions or concerns about the content, text or information contained within the body of this dictation should be directly addressed to the provider for clarification.   "

## 2024-08-08 NOTE — Assessment & Plan Note (Signed)
 Please review oncology history for additional details and timeline of events.  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  He had a CT abdomen and pelvis on 03/31/2024 for complaints of abdominal pain which showed no acute issues, especially no liver lesions.  Patient presented to the ED on 07/02/2024 with complaints of right-sided abdominal pain when lying down.  CT abdomen and pelvis on 07/03/2024 showed new hypoattenuating liver lesions, largest measuring 1.3 cm, concerning for liver metastatic disease.  Restaging PET scan on 07/09/2024 and contrast-enhanced MRI on 07/15/2024 revealed five to six hepatic lesions, the largest measuring approximately one inch in the left lobe, with no evidence of disease in the head and neck or lungs.   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 Additional molecular testing, including PDL1, is pending.  No evidence of disease in the head and neck or lungs post-radiation. Disease is incurable but treatable, with the goal of remission and disease control.   Prognosis is guarded, with risk of further progression and potential hepatic failure if untreated.   Combination chemotherapy and immunotherapy is recommended based on age and functional status, with flexibility for dose adjustments to maintain quality of life. Immunotherapy monotherapy may be considered if chemotherapy is not tolerated or if molecular testing supports this approach.   Induction therapy risks include fatigue, nausea, dysgeusia, and anorexia, particularly during the first week of each cycle. Maintenance immunotherapy is generally well tolerated. Remission and prevention of further spread is anticipated, but cure is not expected due to micrometastatic disease.  -  Arranged portacath placement for chemotherapy and immunotherapy administration. - Scheduled education session regarding chemotherapy, immunotherapy, side effects, and infusion process. - Planned induction regimen with carboplatin, 5-FU, and pembrolizumab (Keytruda) every three weeks for six cycles. - Planned transition to maintenance pembrolizumab every three weeks, with possible extension to every six weeks after initial cycles. - Planned interim PET scan after three to four cycles to assess response. - Ordered ongoing PD-L1 testing to determine eligibility for immunotherapy monotherapy if indicated. - Expedited scheduling of port placement and treatment initiation. - Provided anticipatory guidance regarding expected side effects (fatigue, nausea, dysgeusia, anorexia) during induction. - Discussed dose adjustments and supportive care to maintain quality of life during treatment. - Discussed prognosis and goals of care, emphasizing remission and disease control rather than cure. - Provided conditional plan for immunotherapy monotherapy if chemotherapy is not tolerated or if PD-L1 testing supports this approach. - Planned ongoing monitoring of hepatic function and disease progression during treatment. - Provided anticipatory guidance regarding work and functional status during induction and maintenance phases. - Arranged for nurse to coordinate scheduling and education session next week. - Planned to reassess and adjust treatment based on response and tolerability, with flexibility for dose modifications as needed. - Provided education regarding portacath device for treatment administration. - Planned follow-up in approximately two weeks, pending port placement and education scheduling.  Plan is to proceed with palliative systemic chemoimmunotherapy with carboplatin, 5-FU and pembrolizumab as per NCCN guidelines.  Plan for 6 cycles every 3 weeks followed by maintenance treatments with  pembrolizumab.  Our nurse navigator will arrange appointments accordingly.

## 2024-08-09 ENCOUNTER — Other Ambulatory Visit: Payer: Self-pay

## 2024-08-11 ENCOUNTER — Telehealth: Payer: Self-pay

## 2024-08-11 ENCOUNTER — Ambulatory Visit (HOSPITAL_COMMUNITY)

## 2024-08-11 ENCOUNTER — Other Ambulatory Visit: Payer: Self-pay | Admitting: Oncology

## 2024-08-11 DIAGNOSIS — G893 Neoplasm related pain (acute) (chronic): Secondary | ICD-10-CM

## 2024-08-11 MED ORDER — OXYCODONE HCL 10 MG PO TABS: 10.0000 mg | ORAL_TABLET | ORAL | 0 refills | Status: DC | PRN

## 2024-08-11 NOTE — Telephone Encounter (Signed)
 Called patient to make him aware that Dr. Autumn called in Oxy 10 to his pharmacy. Patient voiced full understanding and had no further questions.

## 2024-08-11 NOTE — Telephone Encounter (Signed)
 Patient called in stating he is having 10/10 pain in his abdomen, she was in the shower and started dry heaving and almost passed out due to the pain. Patient stated he has tried otc Tylenol  and Oxy 5mg  that he had and neither of these worked. Wanted to know if Dr. Autumn could call in something for him for pain. His pharmacy is [facility]. Will call the patient back when physician lets me know what he is going to do.

## 2024-08-13 ENCOUNTER — Telehealth: Payer: Self-pay

## 2024-08-13 ENCOUNTER — Other Ambulatory Visit: Payer: Self-pay | Admitting: Oncology

## 2024-08-13 ENCOUNTER — Other Ambulatory Visit: Payer: Self-pay | Admitting: Radiology

## 2024-08-13 ENCOUNTER — Inpatient Hospital Stay

## 2024-08-13 DIAGNOSIS — C770 Secondary and unspecified malignant neoplasm of lymph nodes of head, face and neck: Secondary | ICD-10-CM

## 2024-08-13 DIAGNOSIS — C099 Malignant neoplasm of tonsil, unspecified: Secondary | ICD-10-CM

## 2024-08-13 MED ORDER — LIDOCAINE-PRILOCAINE 2.5-2.5 % EX CREA: TOPICAL_CREAM | CUTANEOUS | 3 refills | Status: AC

## 2024-08-13 MED ORDER — DEXAMETHASONE 4 MG PO TABS: ORAL_TABLET | ORAL | 1 refills | Status: AC

## 2024-08-13 MED ORDER — ONDANSETRON HCL 8 MG PO TABS: 8.0000 mg | ORAL_TABLET | Freq: Three times a day (TID) | ORAL | 1 refills | Status: AC | PRN

## 2024-08-13 MED ORDER — PROCHLORPERAZINE MALEATE 10 MG PO TABS: 10.0000 mg | ORAL_TABLET | Freq: Four times a day (QID) | ORAL | 1 refills | Status: AC | PRN

## 2024-08-13 NOTE — H&P (Signed)
 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 male with past medical history of hypertension, GERD, anxiety,  squamous cell carcinoma of the left tonsil 2025 with metastasis to ipsilateral cervical lymph node; now with recurrence of disease in liver, stage IV.  He is scheduled today for Port-A-Cath placement for chemotherapy.  He is known to IR team from left neck mass biopsy in 2025 along with with prior gastrostomy tube placement and subsequent removal and liver lesion biopsy on 08/05/2024.  *** Patient is Full Code  Past Medical History:  Diagnosis Date   Anxiety    Cancer (HCC)    squamus   Chronic GERD    Heart murmur    Hypertension    Tachycardia     Past Surgical History:  Procedure Laterality Date   CHOLECYSTECTOMY N/A 10/11/2018   Procedure: LAPAROSCOPIC CHOLECYSTECTOMY WITH INTRAOPERATIVE CHOLANGIOGRAM;  Surgeon: Curvin Deward MOULD, MD;  Location: [facility];  Service: General;  Laterality: N/A;   COLONOSCOPY  ~ 2014   done by GI in [place]. Polyps were removed. Patient not aware what type of polyps these were   DIRECT LARYNGOSCOPY N/A 01/28/2024   Procedure: LARYNGOSCOPY, DIRECT WITH BIOPSIES;  Surgeon: Tobie Eldora NOVAK, MD;  Location: [facility] OR;  Service: ENT;  Laterality: N/A;   ESOPHAGOGASTRODUODENOSCOPY  early 1990s   MD found ulcer   INGUINAL HERNIA REPAIR Left ~ 2010   with Mesh.    IR GASTROSTOMY TUBE MOD SED  03/13/2024   IR GASTROSTOMY TUBE MOD SED  04/03/2024   IR GASTROSTOMY TUBE REMOVAL  04/02/2024   IR GASTROSTOMY TUBE REMOVAL  05/28/2024   VASECTOMY      Allergies: Patient has no known allergies.  Medications: Prior to Admission medications  Medication Sig Start Date End Date Taking? Authorizing Provider   acetaminophen  (TYLENOL ) 500 MG tablet Take 500-1,000 mg by mouth 2 (two) times daily as needed (pain.).    [provider]  buPROPion  (WELLBUTRIN  XL) 150 MG 24 hr tablet Take 1 tablet (150 mg total) by mouth daily. 07/17/24   Arfeen, Leni DASEN, MD  dexamethasone  (DECADRON ) 4 MG tablet Take 2 tablets (8mg ) by mouth daily starting the day after carboplatin for 3 days. Take with food 08/13/24   Pasam, Avinash, MD  fluconazole  (DIFLUCAN ) 10 MG/ML suspension Take 20mL today, then 10mL daily for 20 days. Ok to take by PEG tube. 03/24/24   Izell Domino, MD  ibuprofen (ADVIL) 600 MG tablet Take 600 mg by mouth. 10/19/12   [provider]  lamoTRIgine  (LAMICTAL ) 150 MG tablet Take 1 tablet (150 mg total) by mouth daily. 07/17/24   Arfeen, Syed T, MD  lidocaine -prilocaine  (EMLA ) cream Apply to affected area once 08/13/24   Pasam, Avinash, MD  Melatonin 5 MG TABS Take 5 mg by mouth at bedtime as needed (sleep).    [provider]  metoCLOPramide  (REGLAN ) 5 MG/5ML solution Take 10 mLs (10 mg total) by mouth 4 (four) times daily -  before meals and at bedtime. Ok to take by tube. If you have 4 meals a day, do not take at bedtime. 04/01/24   Izell Domino, MD  metoprolol  succinate (TOPROL -XL) 25 MG 24 hr tablet Take 25 mg by mouth every evening. 09/18/22   [provider]  Nutritional Supplements (  NUTREN 1.5) LIQD 7 cartons Nutren 1.5/equivalent split over 4 feedings Flush tube with 60 ml water before and after each bolus Give additional 3.5 cups water (830 ml water) via PEG/po to meet hydration needs  Provides 2625 kcal, 119 g, 1337 ml free water (2647 ml total water). 1750 ml/day meets >95% est needs, 100% DRI 03/14/24   Izell Domino, MD  olmesartan (BENICAR) 5 MG tablet Take 5 mg by mouth daily. Patient not taking: Reported on 08/05/2024 05/28/24   [provider]  omeprazole  (PRILOSEC) 40 MG capsule Take 1 capsule (40 mg total) by mouth daily. 08/08/24   Pasam, Chinita, MD   ondansetron  (ZOFRAN ) 8 MG tablet Take 1 tablet (8 mg total) by mouth every 8 (eight) hours as needed for nausea or vomiting. Start on the third day after carboplatin. 08/13/24   Pasam, Chinita, MD  oxyCODONE  10 MG TABS Take 1 tablet (10 mg total) by mouth every 4 (four) hours as needed for severe pain (pain score 7-10). 08/11/24   Pasam, Avinash, MD  polyethylene glycol (MIRALAX  / GLYCOLAX ) 17 g packet Take 17 g by mouth 2 (two) times daily. 03/20/24   Regalado, Belkys A, MD  prochlorperazine  (COMPAZINE ) 10 MG tablet Take 1 tablet (10 mg total) by mouth every 6 (six) hours as needed for nausea or vomiting. 08/13/24   Pasam, Chinita, MD  rOPINIRole (REQUIP) 0.5 MG tablet SMARTSIG:1 Tablet(s) By Mouth Every Evening 06/06/24   [provider]     Family History  Problem Relation Age of Onset   Lung cancer Mother    Lymphoma Father    Valvular heart disease Sister     Social History   Socioeconomic History   Marital status: Divorced    Spouse name: Not on file   Number of children: 0   Years of education: Not on file   Highest education level: Not on file  Occupational History   Not on file  Tobacco Use   Smoking status: Never   Smokeless tobacco: Never  Vaping Use   Vaping status: Never Used  Substance and Sexual Activity   Alcohol use: Yes    Comment: rare   Drug use: No   Sexual activity: Not on file  Other Topics Concern   Not on file  Social History Narrative   Not on file   Social Drivers of Health   Tobacco Use: Low Risk (08/08/2024)   Patient History    Smoking Tobacco Use: Never    Smokeless Tobacco Use: Never    Passive Exposure: Not on file  Financial Resource Strain: Not on file  Food Insecurity: No Food Insecurity (03/18/2024)   Epic    Worried About Programme Researcher, Broadcasting/film/video in the Last Year: Never true    Ran Out of Food in the Last Year: Never true  Transportation Needs: No Transportation Needs (03/18/2024)   Epic    Lack of Transportation (Medical): No     Lack of Transportation (Non-Medical): No  Physical Activity: Not on file  Stress: Not on file  Social Connections: Not on file  Depression (PHQ2-9): Low Risk (08/08/2024)   Depression (PHQ2-9)    PHQ-2 Score: 0  Alcohol Screen: Not on file  Housing: Low Risk (03/18/2024)   Epic    Unable to Pay for Housing in the Last Year: No    Number of Times Moved in the Last Year: 0    Homeless in the Last Year: No  Utilities: Not At Risk (03/18/2024)  Epic    Threatened with loss of utilities: No  Health Literacy: Not on file      Review of Systems  Vital Signs:   Advance Care Plan: no documents on fileiiiiiiii  Physical Exam  Imaging: US  BIOPSY (LIVER) Result Date: 08/05/2024 INDICATION: 64 year old with squamous cell carcinoma of the left tonsil. Patient has multiple suspicious liver lesions. Tissue diagnosis is needed. EXAM: ULTRASOUND-GUIDED LIVER LESION BIOPSY MEDICATIONS: Moderate sedation ANESTHESIA/SEDATION: Moderate (conscious) sedation was employed during this procedure. A total of Versed  2 mg and Fentanyl  100 mcg was administered intravenously by the radiology nurse. Total intra-service moderate Sedation Time: 26 minutes. The patient's level of consciousness and vital signs were monitored continuously by radiology nursing throughout the procedure under my direct supervision. FLUOROSCOPY TIME:  None COMPLICATIONS: None immediate. PROCEDURE: Informed written consent was obtained from the patient after a thorough discussion of the procedural risks, benefits and alternatives. All questions were addressed. A timeout was performed prior to the initiation of the procedure. Liver was evaluated ultrasound. A right hepatic lesion was identified and targeted. The right side of the abdomen was prepped with chlorhexidine  and sterile field was created. Skin was anesthetized using 1% lidocaine . Small incision was made. Using ultrasound guidance, 17 gauge coaxial needle was directed into the right  hepatic lesion. Total of 3 core biopsies were performed with an 18 gauge core device. Three adequate specimens obtained and placed in formalin. Gel-Foam slurry was injected as the 17 gauge needle was removed. Bandage placed over the puncture site. FINDINGS: Subtle heterogeneous liver lesions. Partially necrotic or cystic lesion in the right hepatic lobe was targeted. Biopsy needle confirmed within the lesion. No immediate bleeding or hematoma formation. IMPRESSION: Successful ultrasound-guided core biopsies from a right hepatic lesion. Electronically Signed   By: Juliene Balder M.D.   On: 08/05/2024 12:46   MR Abdomen W Wo Contrast Result Date: 07/16/2024 CLINICAL DATA:  Liver disease.  History of malignancy. EXAM: MRI ABDOMEN WITHOUT AND WITH CONTRAST TECHNIQUE: Multiplanar multisequence MR imaging of the abdomen was performed both before and after the administration of intravenous contrast. CONTRAST:  8 mL of Vueway . COMPARISON:  CT scan abdomen and pelvis from 07/03/2024 and nuclear medicine PET scan from 07/11/2024. FINDINGS: Lower chest: Unremarkable MR appearance to the lung bases. No pleural effusion. No pericardial effusion. Normal heart size. Hepatobiliary: The liver is normal in size. Noncirrhotic configuration. There are approximately 5-6, centrally cystic and peripherally T2 mildly hyperintense lesions with target like appearance on postcontrast images. The largest lesion is in the subcapsular left hepatic lobe, segment 3 measuring up to 2.3 x 2.6 cm, favored to represent metastases. The lesion exhibits central necrotic component. No significant interval change since the prior study. No intrahepatic or extrahepatic bile duct dilatation. No choledocholithiasis. Status post cholecystectomy. Pancreas: No mass, inflammatory changes or other parenchymal abnormality identified. No main pancreatic duct dilation. Spleen:  Within normal limits in size and appearance. No focal mass. Adrenals/Urinary Tract:  Unremarkable adrenal glands. No hydroureteronephrosis. No suspicious renal mass. Stomach/Bowel: There is a diverticulum arising from the third part of duodenum. Visualized portions within the abdomen are unremarkable. No disproportionate dilation of bowel loops. Unremarkable appendix. Vascular/Lymphatic: No pathologically enlarged lymph nodes identified. No abdominal aortic aneurysm demonstrated. No ascites. Other:  None. Musculoskeletal: No suspicious bone lesions identified. IMPRESSION: 1. There are approximately 5-6, centrally cystic and peripherally T2 mildly hyperintense lesions with target like appearance on postcontrast images. The largest lesion is in the subcapsular left hepatic lobe, segment 3 measuring  up to 2.3 x 2.6 cm. These are favored to represent metastases. No significant interval change since the prior study. 2. Multiple other nonacute observations, as described above. Electronically Signed   By: Ree Molt M.D.   On: 07/16/2024 15:54    Labs:  CBC: Recent Labs    05/10/24 0909 05/17/24 1744 07/02/24 2117 08/05/24 0958  WBC 4.9 5.9 7.5 9.2  HGB 13.4 13.2 12.9* 13.2  HCT 40.4 40.0 39.7 41.2  PLT 224 205 214 278    COAGS: Recent Labs    03/13/24 0954 04/03/24 0915 08/05/24 0958  INR 1.0 1.0 1.0    BMP: Recent Labs    04/16/24 1115 05/10/24 0909 05/17/24 1744 07/02/24 2117  NA 139 139 135 137  K 4.3 3.8 3.9 4.1  CL 105 103 97* 100  CO2 30 26 26 27   GLUCOSE 140* 118* 98 129*  BUN 16 12 15 13   CALCIUM 9.4 9.3 9.6 9.5  CREATININE 0.99 0.85 0.97 0.73  GFRNONAA >60 >60 >60 >60    LIVER FUNCTION TESTS: Recent Labs    04/16/24 1115 05/10/24 0909 05/17/24 1744 07/02/24 2117  BILITOT 0.6 0.3 0.4 0.3  AST 18 18 18 20   ALT 23 19 20 26   ALKPHOS 72 73 76 92  PROT 7.2 6.7 7.0 6.9  ALBUMIN 4.2 4.2 4.4 4.0    TUMOR MARKERS: No results for input(s): AFPTM, CEA, CA199, CHROMGRNA in the last 8760 hours.  Assessment and Plan: 64 y.o. 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  He is scheduled today for Port-A-Cath placement for chemotherapy.  He is known to IR team from left neck mass biopsy in 2025 along with with prior gastrostomy tube placement and subsequent removal and liver lesion biopsy on 08/05/2024.Risks and benefits of image guided port-a-catheter placement was discussed with the patient including, but not limited to bleeding, infection, pneumothorax, or fibrin sheath development and need for additional procedures.  All of the patient's questions were answered, patient is agreeable to proceed. Consent signed and in chart.    Thank you for allowing our service to participate in Douglas Hester 's care.  Electronically Signed: D. Franky Rakers, PA-C   08/13/2024, 5:06 PM      I spent a total of   20 minutes  in face to face in clinical consultation, greater than 50% of which was counseling/coordinating care for port a  cath placement   "

## 2024-08-13 NOTE — Telephone Encounter (Signed)
 LVM for pt regarding his disabb\ility form being completed,faxed,and confirmation received. I Left call back number. Pt copy was mailed to his home address.

## 2024-08-14 ENCOUNTER — Other Ambulatory Visit: Payer: Self-pay | Admitting: Radiology

## 2024-08-14 ENCOUNTER — Other Ambulatory Visit: Payer: Self-pay

## 2024-08-14 DIAGNOSIS — C099 Malignant neoplasm of tonsil, unspecified: Secondary | ICD-10-CM

## 2024-08-14 DIAGNOSIS — C770 Secondary and unspecified malignant neoplasm of lymph nodes of head, face and neck: Secondary | ICD-10-CM

## 2024-08-14 DIAGNOSIS — C787 Secondary malignant neoplasm of liver and intrahepatic bile duct: Secondary | ICD-10-CM

## 2024-08-14 DIAGNOSIS — C4442 Squamous cell carcinoma of skin of scalp and neck: Secondary | ICD-10-CM

## 2024-08-15 ENCOUNTER — Ambulatory Visit (HOSPITAL_COMMUNITY)
Admission: RE | Admit: 2024-08-15 | Discharge: 2024-08-15 | Disposition: A | Discharge status: Home / Self Care | Source: Ambulatory Visit | Attending: Oncology | Admitting: Oncology

## 2024-08-15 ENCOUNTER — Inpatient Hospital Stay

## 2024-08-15 ENCOUNTER — Encounter: Payer: Self-pay | Admitting: Oncology

## 2024-08-15 ENCOUNTER — Inpatient Hospital Stay (HOSPITAL_BASED_OUTPATIENT_CLINIC_OR_DEPARTMENT_OTHER): Admitting: Oncology

## 2024-08-15 ENCOUNTER — Other Ambulatory Visit: Payer: Self-pay

## 2024-08-15 ENCOUNTER — Encounter (HOSPITAL_COMMUNITY): Payer: Self-pay

## 2024-08-15 VITALS — BP 118/76 | HR 107 | Temp 97.8°F | Resp 17 | Wt 178.3 lb

## 2024-08-15 DIAGNOSIS — C099 Malignant neoplasm of tonsil, unspecified: Secondary | ICD-10-CM | POA: Insufficient documentation

## 2024-08-15 DIAGNOSIS — C787 Secondary malignant neoplasm of liver and intrahepatic bile duct: Secondary | ICD-10-CM | POA: Insufficient documentation

## 2024-08-15 DIAGNOSIS — Z801 Family history of malignant neoplasm of trachea, bronchus and lung: Secondary | ICD-10-CM | POA: Insufficient documentation

## 2024-08-15 DIAGNOSIS — C770 Secondary and unspecified malignant neoplasm of lymph nodes of head, face and neck: Secondary | ICD-10-CM

## 2024-08-15 DIAGNOSIS — R7989 Other specified abnormal findings of blood chemistry: Secondary | ICD-10-CM

## 2024-08-15 DIAGNOSIS — C4442 Squamous cell carcinoma of skin of scalp and neck: Secondary | ICD-10-CM

## 2024-08-15 HISTORY — PX: IR IMAGING GUIDED PORT INSERTION: IMG5740

## 2024-08-15 LAB — TSH: TSH: 1.24 u[IU]/mL (ref 0.350–4.500)

## 2024-08-15 LAB — CBC WITH DIFFERENTIAL (CANCER CENTER ONLY)
Abs Immature Granulocytes: 0.08 K/uL — ABNORMAL HIGH (ref 0.00–0.07)
Basophils Absolute: 0 K/uL (ref 0.0–0.1)
Basophils Relative: 0 %
Eosinophils Absolute: 0 K/uL (ref 0.0–0.5)
Eosinophils Relative: 0 %
HCT: 37.4 % — ABNORMAL LOW (ref 39.0–52.0)
Hemoglobin: 12.1 g/dL — ABNORMAL LOW (ref 13.0–17.0)
Immature Granulocytes: 1 %
Lymphocytes Relative: 4 %
Lymphs Abs: 0.4 K/uL — ABNORMAL LOW (ref 0.7–4.0)
MCH: 28.1 pg (ref 26.0–34.0)
MCHC: 32.4 g/dL (ref 30.0–36.0)
MCV: 87 fL (ref 80.0–100.0)
Monocytes Absolute: 1 K/uL (ref 0.1–1.0)
Monocytes Relative: 11 %
Neutro Abs: 7.7 K/uL (ref 1.7–7.7)
Neutrophils Relative %: 84 %
Platelet Count: 303 K/uL (ref 150–400)
RBC: 4.3 MIL/uL (ref 4.22–5.81)
RDW: 12.8 % (ref 11.5–15.5)
WBC Count: 9.2 K/uL (ref 4.0–10.5)
nRBC: 0 % (ref 0.0–0.2)

## 2024-08-15 LAB — CMP (CANCER CENTER ONLY)
ALT: 79 U/L — ABNORMAL HIGH (ref 0–44)
AST: 45 U/L — ABNORMAL HIGH (ref 15–41)
Albumin: 4.1 g/dL (ref 3.5–5.0)
Alkaline Phosphatase: 298 U/L — ABNORMAL HIGH (ref 38–126)
Anion gap: 11 (ref 5–15)
BUN: 11 mg/dL (ref 8–23)
CO2: 27 mmol/L (ref 22–32)
Calcium: 10 mg/dL (ref 8.9–10.3)
Chloride: 99 mmol/L (ref 98–111)
Creatinine: 0.85 mg/dL (ref 0.61–1.24)
GFR, Estimated: >60 mL/min
Glucose, Bld: 107 mg/dL — ABNORMAL HIGH (ref 70–99)
Potassium: 4.6 mmol/L (ref 3.5–5.1)
Sodium: 137 mmol/L (ref 135–145)
Total Bilirubin: 0.8 mg/dL (ref 0.0–1.2)
Total Protein: 8.1 g/dL (ref 6.5–8.1)

## 2024-08-15 LAB — CBC
HCT: 37.1 % — ABNORMAL LOW (ref 39.0–52.0)
Hemoglobin: 12 g/dL — ABNORMAL LOW (ref 13.0–17.0)
MCH: 28.6 pg (ref 26.0–34.0)
MCHC: 32.3 g/dL (ref 30.0–36.0)
MCV: 88.5 fL (ref 80.0–100.0)
Platelets: 276 K/uL (ref 150–400)
RBC: 4.19 MIL/uL — ABNORMAL LOW (ref 4.22–5.81)
RDW: 12.8 % (ref 11.5–15.5)
WBC: 9.6 K/uL (ref 4.0–10.5)
nRBC: 0 % (ref 0.0–0.2)

## 2024-08-15 LAB — PROTIME-INR
INR: 1.1 (ref 0.8–1.2)
Prothrombin Time: 14.5 s (ref 11.4–15.2)

## 2024-08-15 MED ORDER — SODIUM CHLORIDE 0.9 % IV SOLN: INTRAVENOUS | Status: DC

## 2024-08-15 MED ORDER — FENTANYL CITRATE (PF) 100 MCG/2ML IJ SOLN
INTRAMUSCULAR | Status: AC
  Filled 2024-08-15: qty 2

## 2024-08-15 MED ORDER — DIPHENHYDRAMINE HCL 50 MG/ML IJ SOLN
INTRAMUSCULAR | Status: AC
  Filled 2024-08-15: qty 1

## 2024-08-15 MED ORDER — MIDAZOLAM HCL (PF) 2 MG/2ML IJ SOLN
INTRAMUSCULAR | Status: AC | PRN
  Administered 2024-08-15 (×4): 1 mg via INTRAVENOUS

## 2024-08-15 MED ORDER — HYDROXYZINE HCL 10 MG PO TABS: 10.0000 mg | ORAL_TABLET | Freq: Three times a day (TID) | ORAL | 3 refills | Status: AC | PRN

## 2024-08-15 MED ORDER — HEPARIN SOD (PORK) LOCK FLUSH 100 UNIT/ML IV SOLN
INTRAVENOUS | Status: AC
  Filled 2024-08-15: qty 5

## 2024-08-15 MED ORDER — FENTANYL CITRATE (PF) 100 MCG/2ML IJ SOLN
INTRAMUSCULAR | Status: AC | PRN
  Administered 2024-08-15 (×2): 50 ug via INTRAVENOUS

## 2024-08-15 MED ORDER — MIDAZOLAM HCL 2 MG/2ML IJ SOLN
INTRAMUSCULAR | Status: AC
  Filled 2024-08-15: qty 4

## 2024-08-15 MED ORDER — LIDOCAINE HCL 1 % IJ SOLN
INTRAMUSCULAR | Status: AC
  Filled 2024-08-15: qty 20

## 2024-08-15 MED ORDER — HEPARIN SOD (PORK) LOCK FLUSH 100 UNIT/ML IV SOLN
500.0000 [IU] | Freq: Once | INTRAVENOUS | Status: AC
  Administered 2024-08-15: 500 [IU] via INTRAVENOUS

## 2024-08-15 MED ORDER — DIPHENHYDRAMINE HCL 50 MG/ML IJ SOLN
INTRAMUSCULAR | Status: AC | PRN
  Administered 2024-08-15: 25 mg via INTRAVENOUS

## 2024-08-15 MED ORDER — LIDOCAINE HCL 1 % IJ SOLN
20.0000 mL | Freq: Once | INTRAMUSCULAR | Status: AC
  Administered 2024-08-15: 17 mL via INTRADERMAL

## 2024-08-15 MED FILL — Fosaprepitant Dimeglumine For IV Infusion 150 MG (Base Eq): INTRAVENOUS | Qty: 5 | Status: AC

## 2024-08-15 NOTE — Procedures (Signed)
 Interventional Radiology Procedure Note  Procedure: Single Lumen Power Port Placement    Access:  Right IJ vein.  Findings: Catheter tip positioned at SVC/RA junction. Port is ready for immediate use.   Complications: None  EBL: < 10 mL  Recommendations:  - Ok to shower in 24 hours - Do not submerge for 7 days - Routine line care   Maxi Carreras T. Fredia Sorrow, M.D Pager:  [phone]

## 2024-08-15 NOTE — Discharge Instructions (Addendum)
 Discharge Instructions:   Please call Interventional Radiology clinic [phone] with any questions or concerns.  You may remove your dressing and shower tomorrow.  Do not use EMLA  / Lidocaine  cream for 2 weeks post Port Insertion will remove the surgical glue from the incision.    Implanted Port Insertion, Care After  The following information offers guidance on how to care for yourself after your procedure. Your health care provider may also give you more specific instructions. If you have problems or questions, contact your health care provider. What can I expect after the procedure? After the procedure, it is common to have: Discomfort at the port insertion site. Bruising on the skin over the port. This should improve over 3-4 days. Follow these instructions at home: [facility] care After your port is placed, you will get a manufacturer's information card. The card has information about your port. Keep this card with you at all times. Take care of the port as told by your health care provider. Ask your health care provider if you or a family member can get training for taking care of the port at home. A home health care nurse will be be available to help care for the port. Make sure to remember what type of port you have. Incision care     Follow instructions from your health care provider about how to take care of your port insertion site. Make sure you: Wash your hands with soap and water for at least 20 seconds before and after you change your bandage (dressing). If soap and water are not available, use hand sanitizer. Change your dressing as told by your health care provider. Leave stitches (sutures), skin glue, or adhesive strips in place. These skin closures may need to stay in place for 2 weeks or longer. If adhesive strip edges start to loosen and curl up, you may trim the loose edges. Do not remove adhesive strips completely unless your health care provider tells you to do  that. Check your port insertion site every day for signs of infection. Check for: Redness, swelling, or pain. Fluid or blood. Warmth. Pus or a bad smell. Activity Return to your normal activities as told by your health care provider. Ask your health care provider what activities are safe for you. You may have to avoid lifting. Ask your health care provider how much you can safely lift. General instructions Take over-the-counter and prescription medicines only as told by your health care provider. Do not take baths, swim, or use a hot tub until your health care provider approves. Ask your health care provider if you may take showers. You may only be allowed to take sponge baths. If you were given a sedative during the procedure, it can affect you for several hours. Do not drive or operate machinery until your health care provider says that it is safe. Wear a medical alert bracelet in case of an emergency. This will tell any health care providers that you have a port. Keep all follow-up visits. This is important. Contact a health care provider if: You cannot flush your port with saline as directed, or you cannot draw blood from the port. You have a fever or chills. You have redness, swelling, or pain around your port insertion site. You have fluid or blood coming from your port insertion site. Your port insertion site feels warm to the touch. You have pus or a bad smell coming from the port insertion site. Get help right away if: You have chest  pain or shortness of breath. You have bleeding from your port that you cannot control. These symptoms may be an emergency. Get help right away. Call 911. Do not wait to see if the symptoms will go away. Do not drive yourself to the hospital. Summary Take care of the port as told by your health care provider. Keep the manufacturer's information card with you at all times. Change your dressing as told by your health care provider. Contact a health  care provider if you have a fever or chills or if you have redness, swelling, or pain around your port insertion site. Keep all follow-up visits. This information is not intended to replace advice given to you by your health care provider. Make sure you discuss any questions you have with your health care provider. Document Revised: 01/18/2021 Document Reviewed: 01/18/2021 Elsevier Patient Education  2023 [employer]     Moderate Conscious Sedation, Adult, Care After  This sheet gives you information about how to care for yourself after your procedure. Your health care provider may also give you more specific instructions. If you have problems or questions, contact your health care provider. What can I expect after the procedure? After the procedure, it is common to have: Sleepiness for several hours. Impaired judgment for several hours. Difficulty with balance. Vomiting if you eat too soon. Follow these instructions at home: For the time period you were told by your health care provider: Rest. Do not participate in activities where you could fall or become injured. Do not drive or use machinery. Do not drink alcohol. Do not take sleeping pills or medicines that cause drowsiness. Do not make important decisions or sign legal documents. Do not take care of children on your own. Eating and drinking  Follow the diet recommended by your health care provider. Drink enough fluid to keep your urine pale yellow. If you vomit: Drink water, juice, or soup when you can drink without vomiting. Make sure you have little or no nausea before eating solid foods. General instructions Take over-the-counter and prescription medicines only as told by your health care provider. Have a responsible adult stay with you for the time you are told. It is important to have someone help care for you until you are awake and alert. Do not smoke. Keep all follow-up visits as told by your health care  provider. This is important. Contact a health care provider if: You are still sleepy or having trouble with balance after 24 hours. You feel light-headed. You keep feeling nauseous or you keep vomiting. You develop a rash. You have a fever. You have redness or swelling around the IV site. Get help right away if: You have trouble breathing. You have new-onset confusion at home. Summary After the procedure, it is common to feel sleepy, have impaired judgment, or feel nauseous if you eat too soon. Rest after you get home. Know the things you should not do after the procedure. Follow the diet recommended by your health care provider and drink enough fluid to keep your urine pale yellow. Get help right away if you have trouble breathing or new-onset confusion at home. This information is not intended to replace advice given to you by your health care provider. Make sure you discuss any questions you have with your health care provider. Document Revised: 11/14/2019 Document Reviewed: 06/12/2019 Elsevier Patient Education  2023 [employer].

## 2024-08-15 NOTE — Progress Notes (Signed)
 1700 Ice bag given to used for comfort to right upper neck and right upper chest as instructed. Applied

## 2024-08-15 NOTE — Progress Notes (Signed)
 "  [facility]  ONCOLOGY CLINIC PROGRESS NOTE   Patient Care Team: Katina Pfeiffer, PA-C as PCP - General (Family Medicine) Tobie Eldora NOVAK, MD as Consulting Physician (Otolaryngology) Izell Domino, MD as Attending Physician (Radiation Oncology) Malmfelt, Delon CROME, RN as Oncology Nurse Navigator  PATIENT NAME: Douglas Hester   MR#: [id number] DOB: Mar 20, 1961  Date of visit: 08/15/2024   ASSESSMENT & PLAN:   Douglas Hester is a 64 y.o. 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  Because of stage I disease, plan made to proceed with definitive radiation therapy only.  Unfortunately he had recurrence of disease with liver metastatic disease was noted on CT abdomen and pelvis on 07/03/2024, later biopsy-proven.  Stage IV disease.  Squamous cell carcinoma of left tonsil [facility]) Please review oncology history for additional details and timeline of events.  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  He had a CT abdomen and pelvis on 03/31/2024 for complaints of abdominal pain which showed no acute issues, especially no liver lesions.  Patient presented to the ED on 07/02/2024 with complaints of right-sided abdominal pain when lying down.  CT abdomen and pelvis on 07/03/2024 showed new hypoattenuating liver lesions, largest measuring 1.3 cm, concerning for liver metastatic disease.  Restaging PET scan on 07/09/2024 and contrast-enhanced MRI on 07/15/2024 revealed five to six hepatic lesions, the largest measuring approximately one inch in the left lobe, with no evidence of disease in the head  and neck or lungs.   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 Additional molecular testing, including PDL1, is pending.  No evidence of disease in the head and neck or lungs post-radiation. Disease is incurable but treatable, with the goal of remission and disease control.   Prognosis is guarded, with risk of further progression and potential hepatic failure if untreated.   Combination chemotherapy and immunotherapy is recommended based on age and functional status, with flexibility for dose adjustments to maintain quality of life. Immunotherapy monotherapy may be considered if chemotherapy is not tolerated or if molecular testing supports this approach.   Induction therapy risks include fatigue, nausea, dysgeusia, and anorexia, particularly during the first week of each cycle. Maintenance immunotherapy is generally well tolerated. Remission and prevention of further spread is anticipated, but cure is not expected due to micrometastatic disease.  - Arranged portacath placement for chemotherapy and immunotherapy administration. - Scheduled education session regarding chemotherapy, immunotherapy, side effects, and infusion process. - Planned induction regimen with carboplatin , 5-FU, and pembrolizumab  (Keytruda ) every three weeks for six cycles.  He is scheduled to begin treatments from 08/18/2024.  Labs reviewed today.  LFTs mildly elevated, from metastatic disease burden.  No dose-limiting toxicities.  Will proceed with cycle 1 of chemoimmunotherapy as scheduled.  I will plan to see him approximately 10 days later for toxicity evaluation.  - Planned transition to maintenance pembrolizumab  every three weeks, with possible extension to every six weeks after initial cycles. - Planned interim PET scan after three to four cycles to assess response. - Ordered ongoing PD-L1 testing to determine eligibility for immunotherapy monotherapy if  indicated. -  Expedited scheduling of port placement and treatment initiation. - Provided anticipatory guidance regarding expected side effects (fatigue, nausea, dysgeusia, anorexia) during induction. - Discussed dose adjustments and supportive care to maintain quality of life during treatment. - Discussed prognosis and goals of care, emphasizing remission and disease control rather than cure. - Provided conditional plan for immunotherapy monotherapy if chemotherapy is not tolerated or if PD-L1 testing supports this approach. - Planned ongoing monitoring of hepatic function and disease progression during treatment. - Provided anticipatory guidance regarding work and functional status during induction and maintenance phases. - Planned to reassess and adjust treatment based on response and tolerability, with flexibility for dose modifications as needed.  Proceed with chemotherapy with cycle 1 as scheduled on 08/18/2024.  I will see him approximately in 2 weeks for toxicity evaluation.   Anemia He has mild anemia with slightly decreased hemoglobin, consistent with his underlying disease. Cytotoxic therapy may further impact blood counts, requiring close surveillance. - Monitored hemoglobin, which is slightly low. - Planned ongoing monitoring of blood counts during treatment.  Pruritus due to opioid therapy He developed generalized pruritus following oxycodone  use, a common opioid side effect rather than a true allergy. Xerosis may contribute to symptoms. Alternative narcotic analgesics may not prevent pruritus. - Sent prescription for hydroxyzine  (Atarax ) for pruritus management. - Advised use of moisturizer, including a specific product ordered for hands and feet. - Discussed nighttime use of diphenhydramine  (Benadryl ) as an alternative, with caution regarding sedation. - Advised continuation of pain medication as needed.  Protein-calorie malnutrition At risk for protein-calorie malnutrition  due to decreased appetite, minimal oral intake, dysgeusia, and xerostomia, resulting in reliance on nutritional supplements and soups. Cancer and treatment-related side effects contribute to nutritional compromise. - Referred to nutritionist for assessment and support. - Provided prescription for nutritional supplements (Ensure) and coordinated with nurse to obtain supplies. - Assisted with insurance coverage for nutritional supplements. - Planned ongoing monitoring of nutritional status and weight during treatment.  Gastroesophageal reflux disease Experiencing heartburn, likely exacerbated by decreased oral intake and cancer-related factors. - Prescribed pantoprazole , but it was not covered by his insurance.  Hence prescribed omeprazole .  I reviewed lab results and outside records for this visit and discussed relevant results with the patient. Diagnosis, plan of care and treatment options were also discussed in detail with the patient. Opportunity provided to ask questions and answers provided to his apparent satisfaction. Provided instructions to call our clinic with any problems, questions or concerns prior to return visit. I recommended to continue follow-up with PCP and sub-specialists. He verbalized understanding and agreed with the plan.   NCCN guidelines have been consulted in the planning of this patients care.  I spent a total of 45 minutes during this encounter with the patient including review of chart and various tests results, discussions about plan of care and coordination of care plan.   [name], MD  08/15/2024 5:08 PM  [facility] - A DEPT OF [facility] 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  Because of stage I disease, plan made to proceed with definitive radiation therapy only.  Unfortunately he had recurrence of disease with liver metastatic disease was noted on CT abdomen and pelvis on 07/03/2024,  later biopsy-proven.  Stage IV disease.  Current Treatment: Palliative systemic treatments with carboplatin , 5-FU, pembrolizumab  started from 08/18/2024.  INTERVAL HISTORY:    Discussed the use of AI scribe software for clinical note transcription with the patient, who gave verbal consent to proceed.  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  He was initially diagnosed with stage I head and neck squamous cell carcinoma and underwent definitive radiation therapy.  Patient presented to the ED on 07/02/2024 with complaints of right-sided abdominal pain when lying down.  CT abdomen and pelvis on 07/03/2024 showed new hypoattenuating liver lesions, largest measuring 1.3 cm, concerning for liver metastatic disease.  Restaging PET scan on 07/09/2024 and contrast-enhanced MRI on 07/15/2024 revealed five to six hepatic lesions, the largest measuring approximately one inch in the left lobe, with no evidence of disease in the head and neck or lungs. Biopsy of a liver lesion confirmed metastatic squamous cell carcinoma, positive for P40. Additional molecular testing, including PDL1, is pending.  He currently experiences significant decreased appetite, minimal oral intake, persistent dysgeusia, and xerostomia. He relies on Ensure and a limited variety of soups for nutrition, with family members purchasing Ensure due to insurance coverage issues. He has not reported major weight loss, but oral intake remains poor. He also has ongoing heartburn, managed with  pantoprazole .  Laboratory studies show mild anemia and mildly elevated liver chemistries.  He developed diffuse pruritus last night after taking oxycodone , describing itching all over without associated rash or skin eruption. He has chronic xerosis and applies moisturizer regularly with minimal relief. He denies angioedema, urticaria, or respiratory symptoms.  He discussed disability documentation needs, which were addressed during the visit.     I have reviewed the past medical history, past surgical history, social history and family history with the patient and they are unchanged from previous note.  HISTORY OF PRESENT ILLNESS:   ONCOLOGY HISTORY:   He presented to the ED on 01/03/24 with complains of headaches and an enlarged lymph node to his left neck. An Ultrasound was preformed at that time showing a left submandibular mass suspicious for a necrotic lymph node. He then underwent a CT soft tissue neck to further investigate his symptoms. Scans showed a necrotic 2.6 cm left level II mass suspicious for a conglomerate nodal metastasis. CT chest and head were negative for any abnormalities.    Subsequently, the patient was referred to [name] on 01/04/24 who suggested undergoing a core biopsy of the left neck mass to further investigate his symptoms. Patient underwent procedure on 01/14/24. Surgical pathology indicated moderately differentiated squamous cell carcinoma. Immunohistochemical stain for p16 shows strong blocklike positivity, EBER ISH is negative.    Patient underwent direct laryngoscope under the care of [name] on 01/17/2024 which revealed no obvious abnormalities, or signs of disease primary.    PET scan performed on 01/25/24 re-demonstrated the necrotic left-sided level 2 lymph node measuring up to 3 cm and symmetric upper pharyngeal uptake extending to the left side of the vallecular. No additional areas of abnormal radiotracer uptake at this time to suggest additional areas  of disease.   Patient underwent a laryngoscopy with lingual tonsil biopsy on 01/28/24 under the care of [name].  Pathology from left lingual tonsil showed invasive and in situ moderate to poorly differentiated squamous cell carcinoma (p16 +,  HPV related).  Right lingual tonsil showed focal surface and crypt high-grade dysplasia.  Negative for invasion.  Left tonsillar fossa biopsies were negative.   cT1,cN1,cM0,p16+, Stage I disease.    In the CT of the neck, lymph node measured 2.6 cm.  On the PET scan, it seemed to be measuring up to 3 cm.   Reviewed NCCN guidelines and discussed management options with the patient.   He was not keen on pursuing chemotherapy concurrently with radiation.  He was agreeable to proceeding with definitive radiation therapy alone at this time.  His case was discussed in multidisciplinary ENT conference on 02/13/2024 for consensus opinion and decision made to proceed with definitive radiation therapy alone, which he completed on 04/15/2024 and went into remission.  Patient presented to the ED on 07/02/2024 with complaints of right-sided abdominal pain when lying down.  CT abdomen and pelvis on 07/03/2024 showed new hypoattenuating liver lesions, largest measuring 1.3 cm, concerning for liver metastatic disease.  Restaging PET scan on 07/09/2024 and contrast-enhanced MRI on 07/15/2024 revealed five to six hepatic lesions, the largest measuring approximately one inch in the left lobe, with no evidence of disease in the head and neck or lungs.   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 Additional molecular testing, including PDL1, is pending.  Plan is to proceed with palliative systemic chemoimmunotherapy with carboplatin , 5-FU and pembrolizumab  as per NCCN guidelines.  Plan for 6 cycles every 3 weeks followed by maintenance treatments with pembrolizumab .  Oncology History  Squamous cell carcinoma of left tonsil  (HCC) (Resolved)  01/28/2024 Initial Diagnosis   Squamous cell carcinoma of left tonsil (HCC)   01/29/2024 Cancer Staging   Staging form: Cervical Lymph Nodes and Unknown Primary Tumors of the Head and Neck, AJCC 8th Edition - Clinical: Stage III (cT0, cN1, cM0) - Signed by Autumn Millman, MD on 02/06/2024   Squamous cell carcinoma of left tonsil (HCC)  02/06/2024 Initial Diagnosis   Squamous cell carcinoma of left tonsil (HCC)   02/06/2024 Cancer Staging   Staging form: Pharynx - HPV-Mediated Oropharynx, AJCC 8th Edition - Clinical: Stage I (cT1, cN1, cM0, p16+) - Signed by Autumn Millman, MD on 02/06/2024   08/08/2024 Cancer Staging   Staging form: Pharynx - HPV-Mediated Oropharynx, AJCC 8th Edition - Pathologic: Stage IV (rcT0, rcN0, rpM1, p16+) - Signed by Autumn Millman, MD on 08/08/2024 Stage prefix: Recurrence   08/18/2024 -  Chemotherapy   Patient is on Treatment Plan : HEAD/NECK Pembrolizumab  (200) D1 + Carboplatin  (5) D1 + 5FU (1000) IVCI D1-4 q21d x 6 cycles / Pembrolizumab  (200) D1 q21d         REVIEW OF SYSTEMS:   Review of Systems - Oncology  All other pertinent systems were reviewed with the patient and are negative.  ALLERGIES: He has no known allergies.  MEDICATIONS:  Current Outpatient Medications  Medication Sig Dispense Refill   acetaminophen  (TYLENOL ) 500 MG tablet Take 500-1,000 mg by mouth 2 (two) times daily as needed (pain.).     buPROPion  (WELLBUTRIN  XL) 150 MG 24 hr tablet Take 1 tablet (150 mg total) by mouth daily. 90 tablet 0   dexamethasone  (DECADRON ) 4 MG tablet Take 2 tablets (8mg ) by mouth daily starting the day after carboplatin  for 3 days. Take with food 30 tablet 1   fluconazole  (DIFLUCAN ) 10 MG/ML suspension Take 20mL today, then 10mL daily for 20 days. Ok to take by PEG tube. 220 mL 0   hydrOXYzine  (  ATARAX ) 10 MG tablet Take 1 tablet (10 mg total) by mouth 3 (three) times daily as needed for itching. 30 tablet 3   ibuprofen (ADVIL) 600 MG tablet Take  600 mg by mouth.     lamoTRIgine  (LAMICTAL ) 150 MG tablet Take 1 tablet (150 mg total) by mouth daily. 30 tablet 2   lidocaine -prilocaine  (EMLA ) cream Apply to affected area once 30 g 3   Melatonin 5 MG TABS Take 5 mg by mouth at bedtime as needed (sleep).     metoCLOPramide  (REGLAN ) 5 MG/5ML solution Take 10 mLs (10 mg total) by mouth 4 (four) times daily -  before meals and at bedtime. Ok to take by tube. If you have 4 meals a day, do not take at bedtime. 960 mL 2   metoprolol  succinate (TOPROL -XL) 25 MG 24 hr tablet Take 25 mg by mouth every evening.     Nutritional Supplements (NUTREN 1.5) LIQD 7 cartons Nutren 1.5/equivalent split over 4 feedings Flush tube with 60 ml water before and after each bolus Give additional 3.5 cups water (830 ml water) via PEG/po to meet hydration needs  Provides 2625 kcal, 119 g, 1337 ml free water (2647 ml total water). 1750 ml/day meets >95% est needs, 100% DRI     olmesartan (BENICAR) 5 MG tablet Take 5 mg by mouth daily.     omeprazole  (PRILOSEC) 40 MG capsule Take 1 capsule (40 mg total) by mouth daily. 30 capsule 3   ondansetron  (ZOFRAN ) 8 MG tablet Take 1 tablet (8 mg total) by mouth every 8 (eight) hours as needed for nausea or vomiting. Start on the third day after carboplatin . 30 tablet 1   oxyCODONE  10 MG TABS Take 1 tablet (10 mg total) by mouth every 4 (four) hours as needed for severe pain (pain score 7-10). 90 tablet 0   polyethylene glycol (MIRALAX  / GLYCOLAX ) 17 g packet Take 17 g by mouth 2 (two) times daily. 14 each 0   prochlorperazine  (COMPAZINE ) 10 MG tablet Take 1 tablet (10 mg total) by mouth every 6 (six) hours as needed for nausea or vomiting. 30 tablet 1   rOPINIRole (REQUIP) 0.5 MG tablet SMARTSIG:1 Tablet(s) By Mouth Every Evening     No current facility-administered medications for this visit.   Facility-Administered Medications Ordered in Other Visits  Medication Dose Route Frequency Provider Last Rate Last Admin   0.9 %  sodium  chloride infusion   Intravenous Continuous Huneycutt, Brittany, NP 20 mL/hr at 08/15/24 1330 New Bag at 08/15/24 1330     VITALS:   Blood pressure 118/76, pulse (!) 107, temperature 97.8 F (36.6 C), resp. rate 17, weight 178 lb 4.8 oz (80.9 kg), SpO2 98%.  Wt Readings from Last 3 Encounters:  08/15/24 178 lb 5.6 oz (80.9 kg)  08/15/24 178 lb 4.8 oz (80.9 kg)  08/08/24 181 lb 14.4 oz (82.5 kg)    Body mass index is 26.33 kg/m.    Onc Performance Status - 08/15/24 1156       ECOG Perf Status   ECOG Perf Status Restricted in physically strenuous activity but ambulatory and able to carry out work of a light or sedentary nature, e.g., light house work, office work      KPS SCALE   KPS % SCORE Able to carry on normal activity, minor s/s of disease          PHYSICAL EXAM:   Physical Exam Constitutional:      General: He is not in acute  distress.    Appearance: Normal appearance.  HENT:     Head: Normocephalic and atraumatic.  Eyes:     Conjunctiva/sclera: Conjunctivae normal.  Cardiovascular:     Rate and Rhythm: Normal rate and regular rhythm.  Pulmonary:     Effort: Pulmonary effort is normal. No respiratory distress.  Abdominal:     General: There is no distension.  Neurological:     General: No focal deficit present.     Mental Status: He is alert and oriented to person, place, and time.  Psychiatric:        Mood and Affect: Mood normal.        Behavior: Behavior normal.     LABORATORY DATA:   I have reviewed the data as listed.  Results for orders placed or performed during the hospital encounter of 08/15/24  CBC  Result Value Ref Range   WBC 9.6 4.0 - 10.5 K/uL   RBC 4.19 (L) 4.22 - 5.81 MIL/uL   Hemoglobin 12.0 (L) 13.0 - 17.0 g/dL   HCT 62.8 (L) 60.9 - 47.9 %   MCV 88.5 80.0 - 100.0 fL   MCH 28.6 26.0 - 34.0 pg   MCHC 32.3 30.0 - 36.0 g/dL   RDW 87.1 88.4 - 84.4 %   Platelets 276 150 - 400 K/uL   nRBC 0.0 0.0 - 0.2 %  Protime-INR  Result Value  Ref Range   Prothrombin Time 14.5 11.4 - 15.2 seconds   INR 1.1 0.8 - 1.2  Results for orders placed or performed in visit on 08/15/24  TSH  Result Value Ref Range   TSH 1.240 0.350 - 4.500 uIU/mL  CMP (Cancer Center only)  Result Value Ref Range   Sodium 137 135 - 145 mmol/L   Potassium 4.6 3.5 - 5.1 mmol/L   Chloride 99 98 - 111 mmol/L   CO2 27 22 - 32 mmol/L   Glucose, Bld 107 (H) 70 - 99 mg/dL   BUN 11 8 - 23 mg/dL   Creatinine 9.14 9.38 - 1.24 mg/dL   Calcium 89.9 8.9 - 89.6 mg/dL   Total Protein 8.1 6.5 - 8.1 g/dL   Albumin 4.1 3.5 - 5.0 g/dL   AST 45 (H) 15 - 41 U/L   ALT 79 (H) 0 - 44 U/L   Alkaline Phosphatase 298 (H) 38 - 126 U/L   Total Bilirubin 0.8 0.0 - 1.2 mg/dL   GFR, Estimated >39 >39 mL/min   Anion gap 11 5 - 15  CBC with Differential (Cancer Center Only)  Result Value Ref Range   WBC Count 9.2 4.0 - 10.5 K/uL   RBC 4.30 4.22 - 5.81 MIL/uL   Hemoglobin 12.1 (L) 13.0 - 17.0 g/dL   HCT 62.5 (L) 60.9 - 47.9 %   MCV 87.0 80.0 - 100.0 fL   MCH 28.1 26.0 - 34.0 pg   MCHC 32.4 30.0 - 36.0 g/dL   RDW 87.1 88.4 - 84.4 %   Platelet Count 303 150 - 400 K/uL   nRBC 0.0 0.0 - 0.2 %   Neutrophils Relative % 84 %   Neutro Abs 7.7 1.7 - 7.7 K/uL   Lymphocytes Relative 4 %   Lymphs Abs 0.4 (L) 0.7 - 4.0 K/uL   Monocytes Relative 11 %   Monocytes Absolute 1.0 0.1 - 1.0 K/uL   Eosinophils Relative 0 %   Eosinophils Absolute 0.0 0.0 - 0.5 K/uL   Basophils Relative 0 %   Basophils Absolute 0.0 0.0 -  0.1 K/uL   Immature Granulocytes 1 %   Abs Immature Granulocytes 0.08 (H) 0.00 - 0.07 K/uL      RADIOGRAPHIC STUDIES:  I have personally reviewed the radiological images as listed and agree with the findings in the report.  US  BIOPSY (LIVER) INDICATION: 64 year old with squamous cell carcinoma of the left tonsil. Patient has multiple suspicious liver lesions. Tissue diagnosis is needed.  EXAM: ULTRASOUND-GUIDED LIVER LESION BIOPSY  MEDICATIONS: Moderate  sedation  ANESTHESIA/SEDATION: Moderate (conscious) sedation was employed during this procedure. A total of Versed  2 mg and Fentanyl  100 mcg was administered intravenously by the radiology nurse.  Total intra-service moderate Sedation Time: 26 minutes. The patient's level of consciousness and vital signs were monitored continuously by radiology nursing throughout the procedure under my direct supervision.  FLUOROSCOPY TIME:  None  COMPLICATIONS: None immediate.  PROCEDURE: Informed written consent was obtained from the patient after a thorough discussion of the procedural risks, benefits and alternatives. All questions were addressed. A timeout was performed prior to the initiation of the procedure.  Liver was evaluated ultrasound. A right hepatic lesion was identified and targeted. The right side of the abdomen was prepped with chlorhexidine  and sterile field was created. Skin was anesthetized using 1% lidocaine . Small incision was made. Using ultrasound guidance, 17 gauge coaxial needle was directed into the right hepatic lesion. Total of 3 core biopsies were performed with an 18 gauge core device. Three adequate specimens obtained and placed in formalin. Gel-Foam slurry was injected as the 17 gauge needle was removed. Bandage placed over the puncture site.  FINDINGS: Subtle heterogeneous liver lesions. Partially necrotic or cystic lesion in the right hepatic lobe was targeted. Biopsy needle confirmed within the lesion. No immediate bleeding or hematoma formation.  IMPRESSION: Successful ultrasound-guided core biopsies from a right hepatic lesion.  Electronically Signed   By: Juliene Balder M.D.   On: 08/05/2024 12:46    CODE STATUS:  Code Status History     Date Active Date Inactive Code Status Order ID Comments User Context   08/05/2024 1204 08/06/2024 0528 Full Code [id number]  Balder Juliene, MD HOV   04/03/2024 1148 04/04/2024 0519 Full Code [id number]  Jennefer Ester PARAS, MD  HOV   03/18/2024 1614 03/20/2024 1919 Full Code [id number]  Zella Katha HERO, MD ED   03/13/2024 1336 03/14/2024 0517 Full Code [id number]  Luverne Aran, MD HOV   10/11/2018 1716 10/12/2018 1528 Full Code [id number]  Curvin Deward MOULD, MD Inpatient   04/29/2017 2357 05/01/2017 2012 Full Code [id number]  Luke Agent, MD Inpatient    Questions for Most Recent Historical Code Status (Order [id number])     Question Answer   By: Consent: discussion documented in EHR            Orders Placed This Encounter  Procedures   CBC with Differential (Cancer Center Only)    Standing Status:   Future    Expected Date:   09/11/2024    Expiration Date:   09/11/2025   CMP (Cancer Center only)    Standing Status:   Future    Expected Date:   09/11/2024    Expiration Date:   09/11/2025   CBC with Differential (Cancer Center Only)    Standing Status:   Future    Expected Date:   10/02/2024    Expiration Date:   10/02/2025   CMP (Cancer Center only)    Standing Status:   Future    Expected Date:   10/02/2024  Expiration Date:   10/02/2025   T4    Standing Status:   Future    Expected Date:   10/02/2024    Expiration Date:   10/02/2025   TSH    Standing Status:   Future    Expected Date:   10/02/2024    Expiration Date:   10/02/2025     Future Appointments  Date Time Provider Department Center  08/18/2024  8:15 AM [facility] INFUSION [facility] None  08/22/2024 10:45 AM [facility] FLUSH [facility] None  08/22/2024 11:30 AM Helane Rosaline BRAVO, LCSW [facility] None  09/02/2024  1:45 PM Tobie Eldora NOVAK, MD [facility] None  09/23/2024 10:00 AM Izell Domino, MD [facility] None    This document was completed utilizing speech recognition software. Grammatical errors, random word insertions, pronoun errors, and incomplete sentences are an occasional consequence of this system due to software limitations, ambient noise, and hardware issues. Any formal questions or concerns about the content, text or information contained  within the body of this dictation should be directly addressed to the provider for clarification.   "

## 2024-08-15 NOTE — Assessment & Plan Note (Signed)
 Please review oncology history for additional details and timeline of events.  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  He had a CT abdomen and pelvis on 03/31/2024 for complaints of abdominal pain which showed no acute issues, especially no liver lesions.  Patient presented to the ED on 07/02/2024 with complaints of right-sided abdominal pain when lying down.  CT abdomen and pelvis on 07/03/2024 showed new hypoattenuating liver lesions, largest measuring 1.3 cm, concerning for liver metastatic disease.  Restaging PET scan on 07/09/2024 and contrast-enhanced MRI on 07/15/2024 revealed five to six hepatic lesions, the largest measuring approximately one inch in the left lobe, with no evidence of disease in the head and neck or lungs.   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 Additional molecular testing, including PDL1, is pending.  No evidence of disease in the head and neck or lungs post-radiation. Disease is incurable but treatable, with the goal of remission and disease control.   Prognosis is guarded, with risk of further progression and potential hepatic failure if untreated.   Combination chemotherapy and immunotherapy is recommended based on age and functional status, with flexibility for dose adjustments to maintain quality of life. Immunotherapy monotherapy may be considered if chemotherapy is not tolerated or if molecular testing supports this approach.   Induction therapy risks include fatigue, nausea, dysgeusia, and anorexia, particularly during the first week of each cycle. Maintenance immunotherapy is generally well tolerated. Remission and prevention of further spread is anticipated, but cure is not expected due to micrometastatic disease.  -  Arranged portacath placement for chemotherapy and immunotherapy administration. - Scheduled education session regarding chemotherapy, immunotherapy, side effects, and infusion process. - Planned induction regimen with carboplatin, 5-FU, and pembrolizumab (Keytruda) every three weeks for six cycles.  He is scheduled to begin treatments from 08/18/2024.  Labs reviewed today.  LFTs mildly elevated, from metastatic disease burden.  No dose-limiting toxicities.  Will proceed with cycle 1 of chemoimmunotherapy as scheduled.  I will plan to see him approximately 10 days later for toxicity evaluation.  - Planned transition to maintenance pembrolizumab every three weeks, with possible extension to every six weeks after initial cycles. - Planned interim PET scan after three to four cycles to assess response. - Ordered ongoing PD-L1 testing to determine eligibility for immunotherapy monotherapy if indicated. - Expedited scheduling of port placement and treatment initiation. - Provided anticipatory guidance regarding expected side effects (fatigue, nausea, dysgeusia, anorexia) during induction. - Discussed dose adjustments and supportive care to maintain quality of life during treatment. - Discussed prognosis and goals of care, emphasizing remission and disease control rather than cure. - Provided conditional plan for immunotherapy monotherapy if chemotherapy is not tolerated or if PD-L1 testing supports this approach. - Planned ongoing monitoring of hepatic function and disease progression during treatment. - Provided anticipatory guidance regarding work and functional status during induction and maintenance phases. - Planned to reassess and adjust treatment based on response and tolerability, with flexibility for dose modifications as needed.  Proceed with chemotherapy with cycle 1 as scheduled on 08/18/2024.  I will see him approximately in 2 weeks for toxicity evaluation.

## 2024-08-16 ENCOUNTER — Other Ambulatory Visit: Payer: Self-pay

## 2024-08-16 LAB — T4: T4, Total: 7.8 ug/dL (ref 4.5–12.0)

## 2024-08-18 ENCOUNTER — Telehealth: Payer: Self-pay | Admitting: Oncology

## 2024-08-18 ENCOUNTER — Inpatient Hospital Stay

## 2024-08-18 VITALS — BP 105/78 | HR 89 | Temp 97.6°F | Resp 18

## 2024-08-18 DIAGNOSIS — C099 Malignant neoplasm of tonsil, unspecified: Secondary | ICD-10-CM

## 2024-08-18 MED ORDER — SODIUM CHLORIDE 0.9 % IV SOLN
200.0000 mg | Freq: Once | INTRAVENOUS | Status: AC
  Administered 2024-08-18: 200 mg via INTRAVENOUS
  Filled 2024-08-18: qty 200

## 2024-08-18 MED ORDER — SODIUM CHLORIDE 0.9 % IV SOLN
1000.0000 mg/m2/d | INTRAVENOUS | Status: DC
  Administered 2024-08-18: 8500 mg via INTRAVENOUS
  Filled 2024-08-18: qty 170

## 2024-08-18 MED ORDER — PALONOSETRON HCL INJECTION 0.25 MG/5ML
0.2500 mg | Freq: Once | INTRAVENOUS | Status: AC
  Administered 2024-08-18: 0.25 mg via INTRAVENOUS
  Filled 2024-08-18: qty 5

## 2024-08-18 MED ORDER — SODIUM CHLORIDE 0.9 % IV SOLN: INTRAVENOUS | Status: DC

## 2024-08-18 MED ORDER — SODIUM CHLORIDE 0.9 % IV SOLN
644.0000 mg | Freq: Once | INTRAVENOUS | Status: AC
  Administered 2024-08-18: 640 mg via INTRAVENOUS
  Filled 2024-08-18: qty 64

## 2024-08-18 MED ORDER — DEXAMETHASONE SOD PHOSPHATE PF 10 MG/ML IJ SOLN
10.0000 mg | Freq: Once | INTRAMUSCULAR | Status: AC
  Administered 2024-08-18: 10 mg via INTRAVENOUS
  Filled 2024-08-18: qty 1

## 2024-08-18 MED ORDER — SODIUM CHLORIDE 0.9 % IV SOLN
150.0000 mg | Freq: Once | INTRAVENOUS | Status: AC
  Administered 2024-08-18: 150 mg via INTRAVENOUS
  Filled 2024-08-18: qty 150

## 2024-08-18 NOTE — Patient Instructions (Addendum)
 [facility] - A DEPT OF [place]  Discharge Instructions: Thank you for choosing [facility] to provide your oncology and hematology care.   If you have a lab appointment with the Cancer Center, please go directly to the Cancer Center and check in at the registration area.   Wear comfortable clothing and clothing appropriate for easy access to any Portacath or PICC line.   We strive to give you quality time with your provider. You may need to reschedule your appointment if you arrive late (15 or more minutes).  Arriving late affects you and other patients whose appointments are after yours.  Also, if you miss three or more appointments without notifying the office, you may be dismissed from the clinic at the providers discretion.      For prescription refill requests, have your pharmacy contact our office and allow 72 hours for refills to be completed.    Today you received the following chemotherapy and/or immunotherapy agents Pembrolizumab  (Keytruda ), Carboplatin  (Paraplatin ), and Fluorouracil  (Adrucil )    To help prevent nausea and vomiting after your treatment, we encourage you to take your nausea medication as directed.  BELOW ARE SYMPTOMS THAT SHOULD BE REPORTED IMMEDIATELY: *FEVER GREATER THAN 100.4 F (38 C) OR HIGHER *CHILLS OR SWEATING *NAUSEA AND VOMITING THAT IS NOT CONTROLLED WITH YOUR NAUSEA MEDICATION *UNUSUAL SHORTNESS OF BREATH *UNUSUAL BRUISING OR BLEEDING *URINARY PROBLEMS (pain or burning when urinating, or frequent urination) *BOWEL PROBLEMS (unusual diarrhea, constipation, pain near the anus) TENDERNESS IN MOUTH AND THROAT WITH OR WITHOUT PRESENCE OF ULCERS (sore throat, sores in mouth, or a toothache) UNUSUAL RASH, SWELLING OR PAIN  UNUSUAL VAGINAL DISCHARGE OR ITCHING   Items with * indicate a potential emergency and should be followed up as soon as possible or go to the Emergency Department if any problems should  occur.  Please show the CHEMOTHERAPY ALERT CARD or IMMUNOTHERAPY ALERT CARD at check-in to the Emergency Department and triage nurse.  Should you have questions after your visit or need to cancel or reschedule your appointment, please contact [facility] - A DEPT OF [facility]  Dept: [phone]  and follow the prompts.  Office hours are 8:00 a.m. to 4:30 p.m. Monday - Friday. Please note that voicemails left after 4:00 p.m. may not be returned until the following business day.  We are closed weekends and major holidays. You have access to a nurse at all times for urgent questions. Please call the main number to the clinic Dept: [phone] and follow the prompts.   For any non-urgent questions, you may also contact your provider using MyChart. We now offer e-Visits for anyone 6 and older to request care online for non-urgent symptoms. For details visit [email or web address].   Also download the MyChart app! Go to the app store, search MyChart, open the app, select [place], and log in with your MyChart username and password.

## 2024-08-18 NOTE — Telephone Encounter (Signed)
 I left voicemail for patient regarding upcoming appointments on 08/29/2024 and 09/11/2024.

## 2024-08-19 ENCOUNTER — Other Ambulatory Visit: Payer: Self-pay

## 2024-08-19 ENCOUNTER — Inpatient Hospital Stay

## 2024-08-19 NOTE — Progress Notes (Signed)
 [facility] Clinical Social Work  Initial Assessment   Douglas Hester is a 64 y.o. year old male contacted by phone. Clinical Social Work was referred by distress screen protocol for distress screen needs.   SDOH (Social Determinants of Health) assessments performed: Yes SDOH Interventions    Flowsheet Row Office Visit from 02/06/2024 in [facility] - A Dept Of [place]  SDOH Interventions   Food Insecurity Interventions Intervention Not Indicated  Housing Interventions Intervention Not Indicated  Transportation Interventions Intervention Not Indicated  Utilities Interventions Intervention Not Indicated    SDOH Screenings   Food Insecurity: No Food Insecurity (08/19/2024)  Housing: Low Risk (08/19/2024)  Transportation Needs: No Transportation Needs (08/19/2024)  Utilities: Not At Risk (08/19/2024)  Depression (PHQ2-9): Low Risk (08/15/2024)  Tobacco Use: Low Risk (08/15/2024)    PHQ 2/9:    08/15/2024   11:56 AM 08/08/2024    2:00 PM 03/14/2024    8:25 AM  Depression screen PHQ 2/9  Decreased Interest 0 0 0  Down, Depressed, Hopeless 0 0 0  PHQ - 2 Score 0 0 0     Distress Screen completed: Yes    08/13/2024    6:44 PM  ON[facility] DISTRESS SCREENING  Screening Type Initial Screening  How much distress have you been experiencing in the past week? (0-10) 7  Practical concerns type Housing/Utilities;Finances  Emotional concerns type Worry or anxiety;Sadness or depression;Loss of interest or enjoyment;Loneliness  Physical Concerns Type  Pain;Fatigue;Changes in eating;Loss or change of physical abilities      Family/Social Information:  Housing Arrangement: patient lives alone Family members/support persons in your life? Family, Friends, and [employer] Financial concerns: no  Employment: Out on work excuse LTD.  Income source: Long-Term Disability Financial concerns: No Specific concerns reported. However, patient does have a change in income, may  impact needs.  Type of concern: None reported. Food access concerns: no Religious or spiritual practice: Yes-Christian. Patient's faith is an important factor with navigating adjustment to reoccurrence.  Advanced directives: Appointment scheduled at [facility] for 1/22 Services Currently in place:  Insurance, Income, Transportation, Supprt  Coping/ Adjustment to diagnosis: Patient understands treatment plan and what happens next? yes Concerns about diagnosis and/or treatment: Denied any concerns about diagnosis. Briefly discussed feelings related to reoccurrence.   Patient reported stressors: Adjusting to my illness Hopes and/or priorities: To get healthy and return to work. Current coping skills/ strengths: Ability for insight , Active sense of humor , Average or above average intelligence , Capable of independent living , Communication skills , General fund of knowledge , Motivation for treatment/growth , Religious Affiliation , and Supportive family/friends     SUMMARY: Current SDOH Barriers:  No SDOH barriers reported at time of visit.   Clinical Social Work Clinical Goal(s):  No clinical social work goals at this time  Interventions: Discussed common feeling and emotions when being diagnosed with cancer, and the importance of support during treatment. Informed patient of the support team roles and support services at [facility] ([place] [facility] [place] [facility] [employer]). Provided CSW contact information and encouraged patient to call with any questions or concerns. Discussed application to head and neck living foundation for assistance with cost of ensure.  Sent referral for Massage Therapy Certificate with support services.    Follow Up Plan: CSW will see patient on 1/22 for Head and Neck Living [employer] application signature.  Patient verbalizes understanding of plan: Yes  Lizbeth Sprague, LCSW Clinical Social Worker  [facility]

## 2024-08-21 ENCOUNTER — Inpatient Hospital Stay: Admitting: Oncology

## 2024-08-21 ENCOUNTER — Inpatient Hospital Stay

## 2024-08-22 ENCOUNTER — Inpatient Hospital Stay

## 2024-08-22 ENCOUNTER — Encounter: Payer: Self-pay | Admitting: Oncology

## 2024-08-22 ENCOUNTER — Other Ambulatory Visit (HOSPITAL_COMMUNITY): Payer: Self-pay

## 2024-08-22 ENCOUNTER — Encounter: Payer: Self-pay | Admitting: Radiation Oncology

## 2024-08-22 ENCOUNTER — Other Ambulatory Visit: Payer: Self-pay

## 2024-08-22 VITALS — BP 101/70 | HR 84 | Temp 99.0°F | Resp 18

## 2024-08-22 DIAGNOSIS — C099 Malignant neoplasm of tonsil, unspecified: Secondary | ICD-10-CM

## 2024-08-22 MED ORDER — NYSTATIN 100000 UNIT/ML MT SUSP
5.0000 mL | Freq: Four times a day (QID) | OROMUCOSAL | 1 refills | Status: AC | PRN
  Filled 2024-08-22: qty 150, 8d supply, fill #0
  Filled 2024-09-03: qty 150, 8d supply, fill #1

## 2024-08-22 MED ORDER — MAGIC MOUTHWASH W/LIDOCAINE: 5.0000 mL | Freq: Four times a day (QID) | ORAL | 1 refills | Status: DC | PRN

## 2024-08-22 NOTE — Progress Notes (Signed)
 [facility] CSW Progress Note  Visual Merchandiser met with patient to assist with [employer] and Research Scientist (medical). Application needs proof of income before application will be submitted. Patient will email documents to CSW. Patient notified that CSW assignment will change as of 1/26. CSW will finish up [facility] Living Foundation application, future needs will be directed to new CSW.   Lizbeth Sprague, LCSW Clinical Social Worker [facility]

## 2024-08-22 NOTE — Progress Notes (Signed)
 [facility] Healthcare Advance Directives Clinical Social Work  Patient presented to Advance Directives Clinic  to review and complete healthcare advance directives.  Clinical Social Work Intern met with patient.  The patient designated Inocente L. Turkington (sister) as their primary healthcare agent and Tanda Portugal (brother-in-law) as their secondary agent.  Patient also completed healthcare living will.    Documents were notarized and copies made for patient/family. Clinical Social Worker will send documents to medical records to be scanned into patient's chart. Clinical Social Worker encouraged patient/family to contact with any additional questions or concerns.   Thersia KATHEE Daring Clinical Social Work Intern [employer]

## 2024-08-22 NOTE — Progress Notes (Signed)
 As per the secure chat went and spoke with the patient, he stated he felt like he was dehydrated, some fatigue, has sore all in his mouth making it hard to eat. Made Dr. Autumn aware of the patients concerns he stated to do labs cbc, cmp, and mag.I went back to the patient to let him know he stated he thinks its the chemo so he didn't want to do labs, called in an order for Magic mouthwash to [facility] pharmacy as per Dr. Autumn. Made the patient aware he had no further questions at this time.

## 2024-08-25 ENCOUNTER — Ambulatory Visit (INDEPENDENT_AMBULATORY_CARE_PROVIDER_SITE_OTHER): Admitting: Otolaryngology

## 2024-08-26 ENCOUNTER — Inpatient Hospital Stay

## 2024-08-26 ENCOUNTER — Telehealth: Payer: Self-pay

## 2024-08-26 ENCOUNTER — Encounter (HOSPITAL_BASED_OUTPATIENT_CLINIC_OR_DEPARTMENT_OTHER): Payer: Self-pay

## 2024-08-26 ENCOUNTER — Inpatient Hospital Stay (HOSPITAL_BASED_OUTPATIENT_CLINIC_OR_DEPARTMENT_OTHER)
Admission: EM | Admit: 2024-08-26 | Discharge: 2024-09-02 | DRG: 157 | Disposition: A | Discharge status: Home / Self Care | Attending: Family Medicine | Admitting: Family Medicine

## 2024-08-26 ENCOUNTER — Other Ambulatory Visit: Payer: Self-pay

## 2024-08-26 DIAGNOSIS — K1230 Oral mucositis (ulcerative), unspecified: Principal | ICD-10-CM

## 2024-08-26 DIAGNOSIS — Z923 Personal history of irradiation: Secondary | ICD-10-CM

## 2024-08-26 DIAGNOSIS — K219 Gastro-esophageal reflux disease without esophagitis: Secondary | ICD-10-CM | POA: Diagnosis present

## 2024-08-26 DIAGNOSIS — K1379 Other lesions of oral mucosa: Secondary | ICD-10-CM | POA: Diagnosis present

## 2024-08-26 DIAGNOSIS — C787 Secondary malignant neoplasm of liver and intrahepatic bile duct: Secondary | ICD-10-CM | POA: Diagnosis present

## 2024-08-26 DIAGNOSIS — D701 Agranulocytosis secondary to cancer chemotherapy: Secondary | ICD-10-CM | POA: Diagnosis present

## 2024-08-26 DIAGNOSIS — G893 Neoplasm related pain (acute) (chronic): Secondary | ICD-10-CM | POA: Diagnosis present

## 2024-08-26 DIAGNOSIS — T451X5A Adverse effect of antineoplastic and immunosuppressive drugs, initial encounter: Secondary | ICD-10-CM | POA: Diagnosis present

## 2024-08-26 DIAGNOSIS — J3489 Other specified disorders of nose and nasal sinuses: Secondary | ICD-10-CM

## 2024-08-26 DIAGNOSIS — B370 Candidal stomatitis: Secondary | ICD-10-CM | POA: Diagnosis present

## 2024-08-26 DIAGNOSIS — C099 Malignant neoplasm of tonsil, unspecified: Secondary | ICD-10-CM | POA: Diagnosis present

## 2024-08-26 DIAGNOSIS — Z9049 Acquired absence of other specified parts of digestive tract: Secondary | ICD-10-CM

## 2024-08-26 DIAGNOSIS — T402X5A Adverse effect of other opioids, initial encounter: Secondary | ICD-10-CM | POA: Diagnosis present

## 2024-08-26 DIAGNOSIS — K1231 Oral mucositis (ulcerative) due to antineoplastic therapy: Principal | ICD-10-CM | POA: Diagnosis present

## 2024-08-26 DIAGNOSIS — Z8249 Family history of ischemic heart disease and other diseases of the circulatory system: Secondary | ICD-10-CM

## 2024-08-26 DIAGNOSIS — L2989 Other pruritus: Secondary | ICD-10-CM | POA: Diagnosis present

## 2024-08-26 DIAGNOSIS — D611 Drug-induced aplastic anemia: Secondary | ICD-10-CM | POA: Diagnosis present

## 2024-08-26 DIAGNOSIS — Z79899 Other long term (current) drug therapy: Secondary | ICD-10-CM

## 2024-08-26 DIAGNOSIS — D638 Anemia in other chronic diseases classified elsewhere: Secondary | ICD-10-CM | POA: Diagnosis present

## 2024-08-26 DIAGNOSIS — K5903 Drug induced constipation: Secondary | ICD-10-CM | POA: Diagnosis present

## 2024-08-26 DIAGNOSIS — G2581 Restless legs syndrome: Secondary | ICD-10-CM | POA: Diagnosis present

## 2024-08-26 DIAGNOSIS — I10 Essential (primary) hypertension: Secondary | ICD-10-CM | POA: Diagnosis present

## 2024-08-26 DIAGNOSIS — F3130 Bipolar disorder, current episode depressed, mild or moderate severity, unspecified: Secondary | ICD-10-CM | POA: Diagnosis present

## 2024-08-26 DIAGNOSIS — L299 Pruritus, unspecified: Secondary | ICD-10-CM | POA: Diagnosis present

## 2024-08-26 DIAGNOSIS — R110 Nausea: Secondary | ICD-10-CM | POA: Diagnosis present

## 2024-08-26 LAB — CBC WITH DIFFERENTIAL/PLATELET
Abs Immature Granulocytes: 0.04 10*3/uL (ref 0.00–0.07)
Basophils Absolute: 0 10*3/uL (ref 0.0–0.1)
Basophils Relative: 0 %
Eosinophils Absolute: 0.1 10*3/uL (ref 0.0–0.5)
Eosinophils Relative: 1 %
HCT: 37.1 % — ABNORMAL LOW (ref 39.0–52.0)
Hemoglobin: 12 g/dL — ABNORMAL LOW (ref 13.0–17.0)
Immature Granulocytes: 1 %
Lymphocytes Relative: 7 %
Lymphs Abs: 0.4 10*3/uL — ABNORMAL LOW (ref 0.7–4.0)
MCH: 27.8 pg (ref 26.0–34.0)
MCHC: 32.3 g/dL (ref 30.0–36.0)
MCV: 86.1 fL (ref 80.0–100.0)
Monocytes Absolute: 0.2 10*3/uL (ref 0.1–1.0)
Monocytes Relative: 3 %
Neutro Abs: 5.1 10*3/uL (ref 1.7–7.7)
Neutrophils Relative %: 88 %
Platelets: 269 10*3/uL (ref 150–400)
RBC: 4.31 MIL/uL (ref 4.22–5.81)
RDW: 12.7 % (ref 11.5–15.5)
WBC: 5.8 10*3/uL (ref 4.0–10.5)
nRBC: 0 % (ref 0.0–0.2)

## 2024-08-26 LAB — LIPASE, BLOOD: Lipase: 21 U/L (ref 11–51)

## 2024-08-26 LAB — COMPREHENSIVE METABOLIC PANEL WITH GFR
ALT: 65 U/L — ABNORMAL HIGH (ref 0–44)
AST: 33 U/L (ref 15–41)
Albumin: 4 g/dL (ref 3.5–5.0)
Alkaline Phosphatase: 200 U/L — ABNORMAL HIGH (ref 38–126)
Anion gap: 14 (ref 5–15)
BUN: 15 mg/dL (ref 8–23)
CO2: 26 mmol/L (ref 22–32)
Calcium: 9.5 mg/dL (ref 8.9–10.3)
Chloride: 97 mmol/L — ABNORMAL LOW (ref 98–111)
Creatinine, Ser: 0.91 mg/dL (ref 0.61–1.24)
GFR, Estimated: >60 mL/min
Glucose, Bld: 125 mg/dL — ABNORMAL HIGH (ref 70–99)
Potassium: 3.6 mmol/L (ref 3.5–5.1)
Sodium: 136 mmol/L (ref 135–145)
Total Bilirubin: 0.8 mg/dL (ref 0.0–1.2)
Total Protein: 7.2 g/dL (ref 6.5–8.1)

## 2024-08-26 MED ORDER — LAMOTRIGINE 25 MG PO TABS
150.0000 mg | ORAL_TABLET | Freq: Every day | ORAL | Status: DC
  Administered 2024-08-27 – 2024-09-02 (×7): 150 mg via ORAL
  Filled 2024-08-26: qty 1
  Filled 2024-08-26 (×7): qty 2

## 2024-08-26 MED ORDER — HYDRALAZINE HCL 20 MG/ML IJ SOLN: 10.0000 mg | Freq: Four times a day (QID) | INTRAMUSCULAR | Status: DC | PRN

## 2024-08-26 MED ORDER — ENOXAPARIN SODIUM 40 MG/0.4ML IJ SOSY
40.0000 mg | PREFILLED_SYRINGE | INTRAMUSCULAR | Status: DC
  Administered 2024-08-27 – 2024-09-01 (×6): 40 mg via SUBCUTANEOUS
  Filled 2024-08-26 (×7): qty 0.4

## 2024-08-26 MED ORDER — ROPINIROLE HCL 1 MG PO TABS
0.5000 mg | ORAL_TABLET | Freq: Every day | ORAL | Status: DC
  Administered 2024-08-26 – 2024-09-01 (×7): 0.5 mg via ORAL
  Filled 2024-08-26 (×7): qty 1

## 2024-08-26 MED ORDER — ACETAMINOPHEN 325 MG PO TABS
650.0000 mg | ORAL_TABLET | Freq: Four times a day (QID) | ORAL | Status: DC | PRN
  Administered 2024-08-28 – 2024-08-30 (×4): 650 mg via ORAL
  Filled 2024-08-26 (×4): qty 2

## 2024-08-26 MED ORDER — MORPHINE SULFATE (PF) 4 MG/ML IV SOLN
4.0000 mg | Freq: Once | INTRAVENOUS | Status: AC
  Administered 2024-08-26: 4 mg via INTRAVENOUS
  Filled 2024-08-26: qty 1

## 2024-08-26 MED ORDER — LACTATED RINGERS IV SOLN: INTRAVENOUS | Status: AC

## 2024-08-26 MED ORDER — PANTOPRAZOLE SODIUM 40 MG IV SOLR
40.0000 mg | Freq: Two times a day (BID) | INTRAVENOUS | Status: DC
  Administered 2024-08-26 – 2024-09-02 (×13): 40 mg via INTRAVENOUS
  Filled 2024-08-26 (×14): qty 10

## 2024-08-26 MED ORDER — METOPROLOL TARTRATE 5 MG/5ML IV SOLN
5.0000 mg | Freq: Three times a day (TID) | INTRAVENOUS | Status: DC
  Administered 2024-08-27 – 2024-09-02 (×18): 5 mg via INTRAVENOUS
  Filled 2024-08-26 (×18): qty 5

## 2024-08-26 MED ORDER — METOPROLOL SUCCINATE ER 50 MG PO TB24: 50.0000 mg | ORAL_TABLET | Freq: Every day | ORAL | Status: DC

## 2024-08-26 MED ORDER — ONDANSETRON HCL 4 MG/2ML IJ SOLN
4.0000 mg | Freq: Once | INTRAMUSCULAR | Status: AC
  Administered 2024-08-26: 4 mg via INTRAVENOUS
  Filled 2024-08-26: qty 2

## 2024-08-26 MED ORDER — MORPHINE SULFATE (PF) 4 MG/ML IV SOLN: 4.0000 mg | INTRAVENOUS | Status: DC | PRN

## 2024-08-26 MED ORDER — PANTOPRAZOLE SODIUM 40 MG IV SOLR
40.0000 mg | Freq: Every day | INTRAVENOUS | Status: DC
  Administered 2024-08-26: 40 mg via INTRAVENOUS
  Filled 2024-08-26: qty 10

## 2024-08-26 MED ORDER — POLYETHYLENE GLYCOL 3350 17 G PO PACK
17.0000 g | PACK | Freq: Every day | ORAL | Status: DC | PRN
  Administered 2024-08-28 (×2): 17 g via ORAL
  Filled 2024-08-26 (×2): qty 1

## 2024-08-26 MED ORDER — LIDOCAINE VISCOUS HCL 2 % MT SOLN
15.0000 mL | OROMUCOSAL | Status: DC | PRN
  Administered 2024-08-26 – 2024-09-02 (×21): 15 mL via OROMUCOSAL
  Filled 2024-08-26 (×22): qty 15

## 2024-08-26 MED ORDER — ACETAMINOPHEN 650 MG RE SUPP: 650.0000 mg | Freq: Four times a day (QID) | RECTAL | Status: DC | PRN

## 2024-08-26 MED ORDER — DIPHENHYDRAMINE HCL 50 MG/ML IJ SOLN
12.5000 mg | Freq: Four times a day (QID) | INTRAMUSCULAR | Status: DC | PRN
  Administered 2024-08-27 – 2024-08-30 (×2): 12.5 mg via INTRAVENOUS
  Filled 2024-08-26 (×2): qty 1

## 2024-08-26 MED ORDER — DEXAMETHASONE SODIUM PHOSPHATE 4 MG/ML IJ SOLN
4.0000 mg | Freq: Four times a day (QID) | INTRAMUSCULAR | Status: AC
  Administered 2024-08-26 – 2024-08-27 (×4): 4 mg via INTRAVENOUS
  Filled 2024-08-26 (×4): qty 1

## 2024-08-26 MED ORDER — OXYCODONE HCL 5 MG/5ML PO SOLN: 5.0000 mg | ORAL | Status: DC | PRN

## 2024-08-26 MED ORDER — SODIUM CHLORIDE 0.9 % IV BOLUS
1000.0000 mL | Freq: Once | INTRAVENOUS | Status: AC
  Administered 2024-08-26: 1000 mL via INTRAVENOUS

## 2024-08-26 MED ORDER — HYDROMORPHONE HCL 1 MG/ML IJ SOLN
1.0000 mg | INTRAMUSCULAR | Status: AC | PRN
  Administered 2024-08-26 – 2024-08-27 (×3): 1 mg via INTRAVENOUS
  Filled 2024-08-26 (×3): qty 1

## 2024-08-26 MED ORDER — FENTANYL CITRATE (PF) 50 MCG/ML IJ SOSY
50.0000 ug | PREFILLED_SYRINGE | INTRAMUSCULAR | Status: DC | PRN
  Administered 2024-08-26 (×2): 50 ug via INTRAVENOUS
  Filled 2024-08-26 (×2): qty 1

## 2024-08-26 MED ORDER — BUPROPION HCL ER (XL) 150 MG PO TB24
150.0000 mg | ORAL_TABLET | Freq: Every day | ORAL | Status: DC
  Administered 2024-08-27 – 2024-09-02 (×7): 150 mg via ORAL
  Filled 2024-08-26 (×7): qty 1

## 2024-08-26 MED ORDER — ONDANSETRON HCL 4 MG/2ML IJ SOLN: 4.0000 mg | Freq: Four times a day (QID) | INTRAMUSCULAR | Status: DC | PRN

## 2024-08-26 MED ORDER — OXYCODONE HCL 5 MG/5ML PO SOLN
10.0000 mg | ORAL | Status: DC | PRN
  Administered 2024-08-26 – 2024-08-31 (×16): 10 mg via ORAL
  Filled 2024-08-26 (×16): qty 10

## 2024-08-26 NOTE — Assessment & Plan Note (Signed)
 IV benadryl  PRN or if tolerating PO meds, home hydroxyzine  PRN

## 2024-08-26 NOTE — Assessment & Plan Note (Signed)
 Psychiatry follow up of 07/17/2024 reviewed.  --Resume home Lamictal  and Wellbutrin  --Per note, is no longer taking Paxil, trazodone , mirtazapine 

## 2024-08-26 NOTE — H&P (Signed)
 " History and Physical    Patient: Douglas Hester FMW:[id number] DOB: 1960/10/07 DOA: 08/26/2024 DOS: the patient was seen and examined on 08/26/2024 PCP: Katina Pfeiffer, PA-C  Patient coming from: Home  Chief Complaint:  Chief Complaint  Patient presents with   Oral Pain   HPI: Douglas Hester is a 64 y.o. 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  Oxycodone  and Magic mouthwash with lidocaine  had not been helpful.  He has only been able to tolerate liquids and has started having trouble swallowing his medications. He denied fever, chills, rhinorrhea, sore throat, wheezing or hemoptysis.  No chest pain, palpitations, diaphoresis, PND, orthopnea or pitting edema of the lower extremities.  No abdominal pain, emesis,melena or hematochezia.  No flank pain, dysuria, frequency or hematuria.  No polyuria, polydipsia, polyphagia or blurred vision.   Lab work: CBC sure white count 5.8, hemoglobin 12.0 g/dL platelets 730.  CMP showed chloride of 97 mmol/L, the rest of the electrolytes and renal function were normal.  Glucose 125 mg/dL, lipase 21, ALT 65 alkaline phosphatase 200 units/L, the rest of the hepatic functions were normal.  ED course: Initial vital signs were temperature 99.4 F, pulse 115, respiration 18, BP 140/83 mmHg O2 sat 98% on room air.  The patient received morphine  4 mg IVP, ondansetron  4 mg IVP and 1000 mL of normal saline bolus.   Review of Systems: As mentioned in the history of present illness. All other systems reviewed and are negative. Past Medical History:  Diagnosis Date   Anxiety    Cancer (HCC)    squamus   Chronic GERD    Heart  murmur    Hypertension    Tachycardia    Past Surgical History:  Procedure Laterality Date   CHOLECYSTECTOMY N/A 10/11/2018   Procedure: LAPAROSCOPIC CHOLECYSTECTOMY WITH INTRAOPERATIVE CHOLANGIOGRAM;  Surgeon: Curvin Deward MOULD, MD;  Location: [facility];  Service: General;  Laterality: N/A;   COLONOSCOPY  ~ 2014   done by GI in [place]. Polyps were removed. Patient not aware what type of polyps these were   DIRECT LARYNGOSCOPY N/A 01/28/2024   Procedure: LARYNGOSCOPY, DIRECT WITH BIOPSIES;  Surgeon: Tobie Eldora NOVAK, MD;  Location: [facility] OR;  Service: ENT;  Laterality: N/A;   ESOPHAGOGASTRODUODENOSCOPY  early 1990s   MD found ulcer   INGUINAL HERNIA REPAIR Left ~ 2010   with Mesh.    IR GASTROSTOMY TUBE MOD SED  03/13/2024   IR GASTROSTOMY TUBE MOD SED  04/03/2024   IR GASTROSTOMY TUBE REMOVAL  04/02/2024   IR GASTROSTOMY TUBE REMOVAL  05/28/2024   IR IMAGING GUIDED PORT INSERTION  08/15/2024   VASECTOMY     Social History:  reports that he has never smoked. He has never used smokeless tobacco. He reports current alcohol  use. He reports that he does not use drugs.  Allergies[1]  Family History  Problem Relation Age of Onset   Lung cancer Mother    Lymphoma Father    Valvular heart disease Sister     Prior to Admission medications  Medication Sig Start Date End Date Taking? Authorizing Provider  acetaminophen  (TYLENOL ) 500 MG tablet Take 500-1,000 mg by mouth 2 (two) times daily as needed (  pain.).   Yes [provider]  buPROPion  (WELLBUTRIN  XL) 150 MG 24 hr tablet Take 1 tablet (150 mg total) by mouth daily. 07/17/24  Yes Arfeen, Leni DASEN, MD  lamoTRIgine  (LAMICTAL ) 150 MG tablet Take 1 tablet (150 mg total) by mouth daily. 07/17/24  Yes Arfeen, Leni DASEN, MD  magic mouthwash (nystatin , lidocaine , diphenhydrAMINE , alum & mag hydroxide) suspension Swish and swallow 5 mLs by mouth 4 (four) times daily as needed for mouth pain. Suspension contains equal amounts of Maalox Extra Strength,  nystatin , diphenhydramine  and lidocaine . 08/22/24  Yes Pasam, Avinash, MD  Melatonin 5 MG TABS Take 5 mg by mouth at bedtime as needed (sleep).   Yes [provider]  metoprolol  succinate (TOPROL -XL) 50 MG 24 hr tablet Take 50 mg by mouth daily. 06/06/24  Yes [provider]  omeprazole  (PRILOSEC) 40 MG capsule Take 1 capsule (40 mg total) by mouth daily. Patient taking differently: Take 40 mg by mouth at bedtime. 08/08/24  Yes Pasam, Avinash, MD  oxyCODONE  10 MG TABS Take 1 tablet (10 mg total) by mouth every 4 (four) hours as needed for severe pain (pain score 7-10). 08/11/24  Yes Pasam, Avinash, MD  polyethylene glycol (MIRALAX  / GLYCOLAX ) 17 g packet Take 17 g by mouth 2 (two) times daily. 03/20/24  Yes Regalado, Belkys A, MD  rOPINIRole  (REQUIP ) 0.5 MG tablet SMARTSIG:1 Tablet(s) By Mouth Every Evening 06/06/24  Yes [provider]  dexamethasone  (DECADRON ) 4 MG tablet Take 2 tablets (8mg ) by mouth daily starting the day after carboplatin  for 3 days. Take with food 08/13/24   Pasam, Avinash, MD  fluconazole  (DIFLUCAN ) 10 MG/ML suspension Take 20mL today, then 10mL daily for 20 days. Ok to take by PEG tube. Patient not taking: Reported on 08/26/2024 03/24/24   Izell Domino, MD  hydrOXYzine  (ATARAX ) 10 MG tablet Take 1 tablet (10 mg total) by mouth 3 (three) times daily as needed for itching. 08/15/24   Pasam, Chinita, MD  lidocaine -prilocaine  (EMLA ) cream Apply to affected area once 08/13/24   Pasam, Avinash, MD  metoCLOPramide  (REGLAN ) 5 MG/5ML solution Take 10 mLs (10 mg total) by mouth 4 (four) times daily -  before meals and at bedtime. Ok to take by tube. If you have 4 meals a day, do not take at bedtime. Patient not taking: Reported on 08/26/2024 04/01/24   Izell Domino, MD  Nutritional Supplements (NUTREN 1.5) LIQD 7 cartons Nutren 1.5/equivalent split over 4 feedings Flush tube with 60 ml water before and after each bolus Give additional 3.5 cups water (830 ml water) via  PEG/po to meet hydration needs  Provides 2625 kcal, 119 g, 1337 ml free water (2647 ml total water). 1750 ml/day meets >95% est needs, 100% DRI Patient not taking: Reported on 08/26/2024 03/14/24   Izell Domino, MD  ondansetron  (ZOFRAN ) 8 MG tablet Take 1 tablet (8 mg total) by mouth every 8 (eight) hours as needed for nausea or vomiting. Start on the third day after carboplatin . 08/13/24   Pasam, Chinita, MD  prochlorperazine  (COMPAZINE ) 10 MG tablet Take 1 tablet (10 mg total) by mouth every 6 (six) hours as needed for nausea or vomiting. 08/13/24   Autumn Chinita, MD    Physical Exam: Vitals:   08/26/24 1045 08/26/24 1046 08/26/24 1145 08/26/24 1335  BP: (!) 140/83   116/87  Pulse: (!) 115   95  Resp: 18  17 16   Temp: 99.4 F (37.4 C) 99.2 F (37.3 C)  99 F (37.2 C)  TempSrc: Axillary Oral  Axillary  SpO2: 98%   98%  Weight:      Height:       Physical Exam Vitals and nursing note reviewed.  Constitutional:      General: He is awake. He is not in acute distress.    Appearance: He is normal weight. He is ill-appearing.  HENT:     Head: Normocephalic.     Nose: No rhinorrhea.     Mouth/Throat:     Mouth: Mucous membranes are dry.  Eyes:     General: No scleral icterus.    Pupils: Pupils are equal, round, and reactive to light.  Neck:     Vascular: No JVD.  Cardiovascular:     Rate and Rhythm: Normal rate and regular rhythm.     Heart sounds: S1 normal and S2 normal.  Pulmonary:     Effort: Pulmonary effort is normal.     Breath sounds: Normal breath sounds. No wheezing, rhonchi or rales.  Abdominal:     General: Bowel sounds are normal. There is no distension.     Palpations: Abdomen is soft.     Tenderness: There is no abdominal tenderness. There is no right CVA tenderness or left CVA tenderness.  Musculoskeletal:     Cervical back: Neck supple.     Right lower leg: No edema.     Left lower leg: No edema.  Skin:    General: Skin is warm and dry.  Neurological:      General: No focal deficit present.     Mental Status: He is alert and oriented to person, place, and time.  Psychiatric:        Mood and Affect: Mood normal.        Behavior: Behavior is cooperative.        Data Reviewed:  Results are pending, will review when available.  October/2018 transthoracic echocardiogram report. ----------------------  Study Conclusions   - Left ventricle: The cavity size was normal. There was mild focal    basal hypertrophy of the septum. Systolic function was normal.    The estimated ejection fraction was in the range of 60% to 65%.    Wall motion was normal; there were no regional wall motion    abnormalities. Doppler parameters are consistent with abnormal    left ventricular relaxation (grade 1 diastolic dysfunction).    There was no evidence of elevated ventricular filling pressure by    Doppler parameters.  - Aortic valve: There was no regurgitation.  - Aortic root: The aortic root was normal in size.  - Mitral valve: There was no regurgitation.  - Right ventricle: The cavity size was normal. Wall thickness was    normal. Systolic function was normal.  - Right atrium: The atrium was normal in size.  - Tricuspid valve: There was mild regurgitation.  - Pulmonary arteries: Systolic pressure was within the normal    range.  - Inferior vena cava: The vessel was normal in size.  - Pericardium, extracardiac: There was no pericardial effusion.   EKG: Vent. rate 108 BPM  PR interval 157 ms  QRS duration 132 ms  QT/QTcB 348/467 ms  P-R-T axes 61 -86 35  Sinus tachycardia  RBBB and LAFB ST elevation, consider lateral injury  Assessment and Plan: * Acute oral pain Onset shortly following first cycle of chemotherapy for stage IV tonsillar cancer.   Patient presenting with severe oral pain and ulcerations, inability to tolerate oral intake.   Labs reassuring  at time of admission. --Maintenance IV fluids --IV analgesics as needed --Viscous  lidocaine  every 4 hours as needed -- Discussed with Dr. Autumn. --- Will start the patient on dexamethasone .    Nausea Suspect side effect of chemotherapy.   Patient denies vomiting but has had intermittent nausea. --IV antiemetics as needed --IV fluids as above  Essential hypertension BP was 140/83 in the ED, mildly elevated in setting of pain. --IV hydralazine  PRN for now -- Switch Toprol  XL to IV metoprolol  for now.  Cancer related pain Confirm home regimen pending med history  - oxycodone  10 mg Q4H PRN. Currently with acute oral pain as above. --Titrate pain regimen --IV analgesics PRN per orders when not tolerating PO meds and for breakthrough pain  Bipolar I disorder, most recent episode depressed [facility]) Psychiatry follow up of 07/17/2024 reviewed.  --Resume home Lamictal  and Wellbutrin  The patient is unable to swallow pills now. --Per note, is no longer taking Paxil, trazodone , mirtazapine   GERD (gastroesophageal reflux disease) IV PPI for now  Opioid-induced pruritus IV benadryl  PRN or if tolerating PO meds, home hydroxyzine  PRN  Squamous cell carcinoma of left tonsil (HCC) Status post radiation treatment, during which time he was on tube feeds by G-tube.   Liver mets discovered on surveillance imaging in December. Recently started induction regimen with carboplatin , 5-FU, and pembrolizumab  (Keytruda ) on 08/18/2024.   Follows with Dr. Conchetta.  EDP discussed case with on-call oncologist  - recommended admission for pain control, IV fluids. --Notify Oncology on arrival to [facility] --Consider Palliative care consultation      Advance Care Planning:   Code Status: Full Code   Consults:   Family Communication:   Severity of Illness: The appropriate patient status for this patient is OBSERVATION. Observation status is judged to be reasonable and necessary in order to provide the required intensity of service to ensure the patient's safety. The patient's  presenting symptoms, physical exam findings, and initial radiographic and laboratory data in the context of their medical condition is felt to place them at decreased risk for further clinical deterioration. Furthermore, it is anticipated that the patient will be medically stable for discharge from the hospital within 2 midnights of admission.   Author: Alm Dorn Castor, MD 08/26/2024 2:54 PM  For on call review [email or web address].   This document was prepared using Dragon voice recognition software and may contain some unintended transcription errors.     [1] No Known Allergies  "

## 2024-08-26 NOTE — ED Provider Notes (Signed)
 "  EMERGENCY DEPARTMENT AT [facility] Provider Note   CSN: [id number] Arrival date & time: 08/26/24  1032     Patient presents with: Oral Pain   Douglas Hester is a 64 y.o. male.   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  Patient states that he started chemotherapy on Monday and had a 4-day infusion at home.  He states that he shortly after developed painful ulcers in his mouth.  He states that he was prescribed a lidocaine  solution and has been doing salt water and baking soda rinses without any improvement of his pain.  He states that he is having difficulty swallowing any solids and has been only able to eat some Jell-O and a small amount of broth.  He states that he is starting to feel dehydrated and is feeling generally weak.  He denies any fever, reports some nausea but denies any vomiting, reports some diarrhea.  He states he has had some lower abdominal cramping with the diarrhea.  Denies any chest pain.  The history is provided by the patient and a relative.  Oral Pain       Prior to Admission medications  Medication Sig Start Date End Date Taking? Authorizing Provider  acetaminophen  (TYLENOL ) 500 MG tablet Take 500-1,000 mg by mouth 2 (two) times daily as needed (pain.).   Yes [provider]  buPROPion  (WELLBUTRIN  XL) 150 MG 24 hr tablet Take 1 tablet (150 mg total) by mouth daily. 07/17/24  Yes Arfeen, Leni DASEN, MD  lamoTRIgine  (LAMICTAL ) 150 MG tablet Take 1 tablet (150 mg total) by mouth daily. 07/17/24  Yes Arfeen, Leni DASEN, MD  magic mouthwash (nystatin , lidocaine , diphenhydrAMINE , alum & mag hydroxide) suspension Swish and swallow 5 mLs by mouth 4 (four) times daily as needed for mouth pain. Suspension contains equal amounts of Maalox Extra Strength, nystatin , diphenhydramine  and lidocaine . 08/22/24  Yes Pasam,  Avinash, MD  Melatonin 5 MG TABS Take 5 mg by mouth at bedtime as needed (sleep).   Yes [provider]  metoprolol  succinate (TOPROL -XL) 50 MG 24 hr tablet Take 50 mg by mouth daily. 06/06/24  Yes [provider]  omeprazole  (PRILOSEC) 40 MG capsule Take 1 capsule (40 mg total) by mouth daily. Patient taking differently: Take 40 mg by mouth at bedtime. 08/08/24  Yes Pasam, Avinash, MD  oxyCODONE  10 MG TABS Take 1 tablet (10 mg total) by mouth every 4 (four) hours as needed for severe pain (pain score 7-10). 08/11/24  Yes Pasam, Avinash, MD  polyethylene glycol (MIRALAX  / GLYCOLAX ) 17 g packet Take 17 g by mouth 2 (two) times daily. 03/20/24  Yes Regalado, Belkys A, MD  rOPINIRole  (REQUIP ) 0.5 MG tablet SMARTSIG:1 Tablet(s) By Mouth Every Evening 06/06/24  Yes [provider]  dexamethasone  (DECADRON ) 4 MG tablet Take 2 tablets (8mg ) by mouth daily starting the day after carboplatin  for 3 days. Take with food 08/13/24   Pasam, Avinash, MD  fluconazole  (DIFLUCAN ) 10 MG/ML suspension Take 20mL today, then 10mL daily for 20 days. Ok to take by PEG tube. Patient not taking: Reported on 08/26/2024 03/24/24   Izell Domino, MD  hydrOXYzine  (ATARAX ) 10 MG tablet Take 1 tablet (10 mg total) by mouth 3 (three) times daily as needed for itching. 08/15/24   Pasam, Chinita, MD  lidocaine -prilocaine  (EMLA ) cream Apply to affected area once 08/13/24   Pasam,  Chinita, MD  metoCLOPramide  (REGLAN ) 5 MG/5ML solution Take 10 mLs (10 mg total) by mouth 4 (four) times daily -  before meals and at bedtime. Ok to take by tube. If you have 4 meals a day, do not take at bedtime. Patient not taking: Reported on 08/26/2024 04/01/24   Izell Domino, MD  Nutritional Supplements (NUTREN 1.5) LIQD 7 cartons Nutren 1.5/equivalent split over 4 feedings Flush tube with 60 ml water before and after each bolus Give additional 3.5 cups water (830 ml water) via PEG/po to meet hydration needs  Provides 2625 kcal, 119 g,  1337 ml free water (2647 ml total water). 1750 ml/day meets >95% est needs, 100% DRI Patient not taking: Reported on 08/26/2024 03/14/24   Izell Domino, MD  ondansetron  (ZOFRAN ) 8 MG tablet Take 1 tablet (8 mg total) by mouth every 8 (eight) hours as needed for nausea or vomiting. Start on the third day after carboplatin . 08/13/24   Pasam, Chinita, MD  prochlorperazine  (COMPAZINE ) 10 MG tablet Take 1 tablet (10 mg total) by mouth every 6 (six) hours as needed for nausea or vomiting. 08/13/24   Pasam, Chinita, MD    Allergies: Patient has no known allergies.    Review of Systems  Updated Vital Signs BP 114/79 (BP Location: Right Arm)   Pulse (!) 101   Temp 98.3 F (36.8 C) (Oral)   Resp 15   Ht 5' 9 (1.753 m)   Wt 74.8 kg   SpO2 98%   BMI 24.37 kg/m   Physical Exam Vitals and nursing note reviewed.  Constitutional:      General: He is not in acute distress.    Appearance: Normal appearance.  HENT:     Head: Normocephalic and atraumatic.     Nose: Nose normal.     Mouth/Throat:     Mouth: Mucous membranes are moist.     Comments: Multiple ulcerations across the lower lip, along the border of the tongue, on the hard palate and uvula, no uvular swelling, normal phonation, no trismus Eyes:     Extraocular Movements: Extraocular movements intact.     Conjunctiva/sclera: Conjunctivae normal.  Cardiovascular:     Rate and Rhythm: Regular rhythm. Tachycardia present.     Heart sounds: Normal heart sounds.  Pulmonary:     Effort: Pulmonary effort is normal.     Breath sounds: Normal breath sounds. No stridor.  Abdominal:     General: Abdomen is flat.     Palpations: Abdomen is soft.     Tenderness: There is no abdominal tenderness.  Musculoskeletal:        General: Normal range of motion.     Cervical back: Normal range of motion.  Skin:    General: Skin is warm and dry.  Neurological:     General: No focal deficit present.     Mental Status: He is alert and oriented to  person, place, and time.  Psychiatric:        Mood and Affect: Mood normal.        Behavior: Behavior normal.     (all labs ordered are listed, but only abnormal results are displayed) Labs Reviewed  COMPREHENSIVE METABOLIC PANEL WITH GFR - Abnormal; Notable for the following components:      Result Value   Chloride 97 (*)    Glucose, Bld 125 (*)    ALT 65 (*)    Alkaline Phosphatase 200 (*)    All other components within normal limits  CBC WITH  DIFFERENTIAL/PLATELET - Abnormal; Notable for the following components:   Hemoglobin 12.0 (*)    HCT 37.1 (*)    Lymphs Abs 0.4 (*)    All other components within normal limits  CBC - Abnormal; Notable for the following components:   WBC 3.8 (*)    RBC 4.01 (*)    Hemoglobin 11.1 (*)    HCT 35.5 (*)    All other components within normal limits  COMPREHENSIVE METABOLIC PANEL WITH GFR - Abnormal; Notable for the following components:   Glucose, Bld 133 (*)    ALT 60 (*)    Alkaline Phosphatase 193 (*)    All other components within normal limits  CBC WITH DIFFERENTIAL/PLATELET - Abnormal; Notable for the following components:   WBC 1.1 (*)    RBC 3.88 (*)    Hemoglobin 10.7 (*)    HCT 34.1 (*)    Neutro Abs 0.3 (*)    Lymphs Abs 0.3 (*)    All other components within normal limits  COMPREHENSIVE METABOLIC PANEL WITH GFR - Abnormal; Notable for the following components:   Sodium 131 (*)    Chloride 94 (*)    Total Protein 6.3 (*)    Albumin 3.2 (*)    AST 87 (*)    ALT 122 (*)    Alkaline Phosphatase 235 (*)    All other components within normal limits  CBC WITH DIFFERENTIAL/PLATELET - Abnormal; Notable for the following components:   WBC 1.3 (*)    RBC 3.92 (*)    Hemoglobin 11.0 (*)    HCT 34.0 (*)    Platelets 145 (*)    Neutro Abs 0.4 (*)    Lymphs Abs 0.3 (*)    All other components within normal limits  COMPREHENSIVE METABOLIC PANEL WITH GFR - Abnormal; Notable for the following components:   Sodium 133 (*)     Chloride 95 (*)    Glucose, Bld 111 (*)    Total Protein 6.3 (*)    Albumin 3.4 (*)    AST 64 (*)    ALT 154 (*)    Alkaline Phosphatase 245 (*)    All other components within normal limits  CBC WITH DIFFERENTIAL/PLATELET - Abnormal; Notable for the following components:   WBC 2.3 (*)    RBC 4.08 (*)    Hemoglobin 11.5 (*)    HCT 35.8 (*)    Platelets 105 (*)    Neutro Abs 0.9 (*)    Lymphs Abs 0.4 (*)    Abs Immature Granulocytes 0.42 (*)    All other components within normal limits  COMPREHENSIVE METABOLIC PANEL WITH GFR - Abnormal; Notable for the following components:   CO2 33 (*)    Glucose, Bld 142 (*)    Albumin 3.4 (*)    AST 49 (*)    ALT 145 (*)    Alkaline Phosphatase 252 (*)    All other components within normal limits  CBC WITH DIFFERENTIAL/PLATELET - Abnormal; Notable for the following components:   RBC 3.78 (*)    Hemoglobin 10.7 (*)    HCT 33.2 (*)    Platelets 114 (*)    nRBC 0.4 (*)    Lymphs Abs 0.5 (*)    Abs Immature Granulocytes 0.09 (*)    All other components within normal limits  LIPASE, BLOOD  CBC    EKG: EKG Interpretation Date/Time:  Tuesday August 26 2024 11:08:46 EST Ventricular Rate:  108 PR Interval:  157 QRS Duration:  132  QT Interval:  348 QTC Calculation: 467 R Axis:   -86  Text Interpretation: Sinus tachycardia RBBB and LAFB ST elevation, consider lateral injury No significant change since last tracing Confirmed by Ellouise Fine ([id number]) on 08/26/2024 11:25:50 AM  Radiology: No results found.   Procedures   Medications Ordered in the ED  lidocaine  (XYLOCAINE ) 2 % viscous mouth solution 15 mL (15 mLs Mouth/Throat Given 09/01/24 0506)  hydrALAZINE  (APRESOLINE ) injection 10 mg (has no administration in time range)  diphenhydrAMINE  (BENADRYL ) injection 12.5 mg (12.5 mg Intravenous Given 08/30/24 0835)  lactated ringers  infusion ( Intravenous Stopped 08/28/24 1405)  enoxaparin  (LOVENOX ) injection 40 mg (40 mg Subcutaneous  Given 08/31/24 2127)  acetaminophen  (TYLENOL ) tablet 650 mg (650 mg Oral Given 08/30/24 2334)    Or  acetaminophen  (TYLENOL ) suppository 650 mg ( Rectal See Alternative 08/30/24 2334)  buPROPion  (WELLBUTRIN  XL) 24 hr tablet 150 mg (0 mg Oral Duplicate 08/31/24 0807)  lamoTRIgine  (LAMICTAL ) tablet 150 mg (0 mg Oral Duplicate 08/31/24 0808)  polyethylene glycol (MIRALAX  / GLYCOLAX ) packet 17 g (17 g Oral Given 08/28/24 1901)  rOPINIRole  (REQUIP ) tablet 0.5 mg (0.5 mg Oral Given 08/31/24 2127)  metoprolol  tartrate (LOPRESSOR ) injection 5 mg (5 mg Intravenous Given 09/01/24 0506)  pantoprazole  (PROTONIX ) injection 40 mg (40 mg Intravenous Given 08/31/24 2127)  artificial tears ophthalmic solution 2 drop (has no administration in time range)  fluconazole  (DIFLUCAN ) IVPB 400 mg (400 mg Intravenous New Bag/Given 08/31/24 1116)  HYDROmorphone  (DILAUDID ) injection 1 mg (1 mg Intravenous Given 09/01/24 0239)  sodium chloride  (OCEAN) 0.65 % nasal spray 1 spray (1 spray Each Nare Given 08/27/24 1518)  fiber (NUTRISOURCE FIBER) 1 packet (1 packet Oral Given 08/31/24 2136)  lactated ringers  infusion ( Intravenous Stopped 08/29/24 1238)  ondansetron  (ZOFRAN ) injection 4 mg (4 mg Intravenous Given 08/30/24 0336)  filgrastim -aafi (NIVESTYM ) injection 300 mcg (300 mcg Subcutaneous Given 08/31/24 1705)  phenol (CHLORASEPTIC) mouth spray 1 spray (has no administration in time range)  oxyCODONE  (ROXICODONE ) 5 MG/5ML solution 10 mg (10 mg Oral Given 09/01/24 0506)  0.9 %  sodium chloride  infusion ( Intravenous New Bag/Given 08/31/24 2323)  sodium chloride  0.9 % bolus 1,000 mL (0 mLs Intravenous Stopped 08/26/24 1216)  morphine  (PF) 4 MG/ML injection 4 mg (4 mg Intravenous Given 08/26/24 1106)  ondansetron  (ZOFRAN ) injection 4 mg (4 mg Intravenous Given 08/26/24 1106)  HYDROmorphone  (DILAUDID ) injection 1 mg (1 mg Intravenous Given 08/27/24 0514)  dexamethasone  (DECADRON ) injection 4 mg (4 mg Intravenous Given 08/27/24 1159)  ondansetron  (ZOFRAN )  injection 4 mg (4 mg Intravenous Given 08/29/24 0444)    Clinical Course as of 09/01/24 0755  Tue Aug 26, 2024  1143 Labs without acute abnormality, no signs of severe dehydration. Will plan to discuss with oncology for treatment recommendations.  [VK]  1147 I spoke with Dr. Dorie with oncology who states if pain is uncontrolled would recommend admission to [facility] for pain control and fluids. Would not recommend any steroids. [VK]    Clinical Course User Index [VK] Kingsley, Tulsi Crossett K, DO                                 Medical Decision Making This patient presents to the ED with chief complaint(s) of oral ulcerations with pertinent past medical history of stage IV tonsillar cancer recently started on chemotherapy which further complicates the presenting complaint. The complaint involves an extensive differential diagnosis and also carries with it a  high risk of complications and morbidity.    The differential diagnosis includes chemo induced stomatitis, aphthous ulcer, dehydration, electrolyte abnormality, esophagitis  Additional history obtained: Additional history obtained from family Records reviewed outpatient oncology records  ED Course and Reassessment: On patient's arrival he is mildly tachycardic and otherwise hemodynamically stable in no acute distress.  He does have multiple ulcerations within his oropharynx.  The patient will have labs to evaluate for dehydration, will be started on fluids and given pain and nausea control.  Will plan to discuss with oncology for treatment recommendations and he will be closely reassessed.  Independent labs interpretation:  The following labs were independently interpreted: within normal range  Independent visualization of imaging: - N/A  Consultation: - Consulted or discussed management/test interpretation w/ external professional: oncology, hospitalist  Consideration for admission or further workup: patient requires admission for  hydration and pain control  Social Determinants of health: N/A    Amount and/or Complexity of Data Reviewed Labs: ordered.  Risk Prescription drug management. Decision regarding hospitalization.       Final diagnoses:  Mucositis oral    ED Discharge Orders     None          Kingsley, Rickita Forstner K, [place] 09/01/24 9244  "

## 2024-08-26 NOTE — ED Notes (Signed)
 [facility] called, spoke to [place] to arrange transport

## 2024-08-26 NOTE — ED Notes (Signed)
 Patient departed [facility] ED with [facility] at this time.

## 2024-08-26 NOTE — ED Notes (Signed)
 Report received from [place], [place]. Assuming patient care at this time.

## 2024-08-26 NOTE — Assessment & Plan Note (Signed)
 BP was 140/83 in the ED, mildly elevated in setting of pain. --IV hydralazine  PRN for now --Resume home regimen pending med history and when able to tolerate PO meds

## 2024-08-26 NOTE — Assessment & Plan Note (Signed)
 Suspect side effect of chemotherapy.  Patient denies vomiting but has had intermittent nausea. --IV antiemetics as needed --IV fluids as above

## 2024-08-26 NOTE — Assessment & Plan Note (Signed)
 Confirm home regimen pending med history - oxycodone  10 mg Q4H PRN. Currently with acute oral pain as above. --Titrate pain regimen --IV analgesics PRN per orders when not tolerating PO meds and for breakthrough pain

## 2024-08-26 NOTE — Telephone Encounter (Signed)
 Followed up with patient per voicemail to address increasing mouth soreness from chemotherapy. Patient instructed to make a mouth rinse with 1 tsp backing soda, 1 tsp salt to combine into 8 oz room temperature tap water. Rinse and spite TID for relief. Patient unstructured to call us  and let us  know if this does not help.

## 2024-08-26 NOTE — Assessment & Plan Note (Addendum)
 Status post radiation treatment, during which time he was on tube feeds by G-tube.  Liver mets discovered on surveillance imaging in December. Recently started induction regimen with carboplatin , 5-FU, and pembrolizumab  (Keytruda ) on 08/18/2024.  Follows with Dr. Conchetta.  EDP discussed case with on-call oncologist - recommended admission for pain control, IV fluids. --[facility] Oncology on [facility] --Consider Palliative care consultation

## 2024-08-26 NOTE — ED Notes (Signed)
 Patient called out stating his pain was getting worse. Requesting pain meds. States he would like to not have morphine  again as it did not work well with him and made him very nauseous. Provider aware

## 2024-08-26 NOTE — ED Triage Notes (Signed)
 Has stage 4 liver cancer, received first chemo treatment last week and states he is having complications from the chemo. Started developing mouth sores and swelling on Tuesday. States unable to eat/drink. Some diarrhea.

## 2024-08-26 NOTE — Assessment & Plan Note (Signed)
 Onset shortly following first cycle of chemotherapy for stage IV tonsillar cancer.  Patient presenting with severe oral pain and ulcerations, inability to tolerate oral intake.  Labs reassuring at time of admission. --Maintenance IV fluids --IV analgesics as needed --Viscous lidocaine  every 4 hours as needed

## 2024-08-26 NOTE — Assessment & Plan Note (Signed)
 IV PPI for now

## 2024-08-27 DIAGNOSIS — T402X5A Adverse effect of other opioids, initial encounter: Secondary | ICD-10-CM | POA: Diagnosis not present

## 2024-08-27 DIAGNOSIS — K1379 Other lesions of oral mucosa: Secondary | ICD-10-CM | POA: Diagnosis not present

## 2024-08-27 DIAGNOSIS — D701 Agranulocytosis secondary to cancer chemotherapy: Secondary | ICD-10-CM | POA: Diagnosis not present

## 2024-08-27 DIAGNOSIS — G893 Neoplasm related pain (acute) (chronic): Secondary | ICD-10-CM | POA: Diagnosis not present

## 2024-08-27 DIAGNOSIS — C099 Malignant neoplasm of tonsil, unspecified: Secondary | ICD-10-CM

## 2024-08-27 DIAGNOSIS — K1230 Oral mucositis (ulcerative), unspecified: Secondary | ICD-10-CM | POA: Diagnosis not present

## 2024-08-27 DIAGNOSIS — K5903 Drug induced constipation: Secondary | ICD-10-CM | POA: Diagnosis not present

## 2024-08-27 DIAGNOSIS — L2989 Other pruritus: Secondary | ICD-10-CM | POA: Diagnosis not present

## 2024-08-27 DIAGNOSIS — T451X5A Adverse effect of antineoplastic and immunosuppressive drugs, initial encounter: Secondary | ICD-10-CM | POA: Diagnosis not present

## 2024-08-27 LAB — COMPREHENSIVE METABOLIC PANEL WITH GFR
ALT: 60 U/L — ABNORMAL HIGH (ref 0–44)
AST: 26 U/L (ref 15–41)
Albumin: 3.7 g/dL (ref 3.5–5.0)
Alkaline Phosphatase: 193 U/L — ABNORMAL HIGH (ref 38–126)
Anion gap: 9 (ref 5–15)
BUN: 11 mg/dL (ref 8–23)
CO2: 27 mmol/L (ref 22–32)
Calcium: 9.7 mg/dL (ref 8.9–10.3)
Chloride: 101 mmol/L (ref 98–111)
Creatinine, Ser: 0.64 mg/dL (ref 0.61–1.24)
GFR, Estimated: >60 mL/min
Glucose, Bld: 133 mg/dL — ABNORMAL HIGH (ref 70–99)
Potassium: 4.4 mmol/L (ref 3.5–5.1)
Sodium: 136 mmol/L (ref 135–145)
Total Bilirubin: 0.7 mg/dL (ref 0.0–1.2)
Total Protein: 6.7 g/dL (ref 6.5–8.1)

## 2024-08-27 LAB — CBC
HCT: 35.5 % — ABNORMAL LOW (ref 39.0–52.0)
Hemoglobin: 11.1 g/dL — ABNORMAL LOW (ref 13.0–17.0)
MCH: 27.7 pg (ref 26.0–34.0)
MCHC: 31.3 g/dL (ref 30.0–36.0)
MCV: 88.5 fL (ref 80.0–100.0)
Platelets: 256 10*3/uL (ref 150–400)
RBC: 4.01 MIL/uL — ABNORMAL LOW (ref 4.22–5.81)
RDW: 12.7 % (ref 11.5–15.5)
WBC: 3.8 10*3/uL — ABNORMAL LOW (ref 4.0–10.5)
nRBC: 0 % (ref 0.0–0.2)

## 2024-08-27 MED ORDER — FLUCONAZOLE IN SODIUM CHLORIDE 400-0.9 MG/200ML-% IV SOLN
400.0000 mg | INTRAVENOUS | Status: DC
  Administered 2024-08-27 – 2024-08-31 (×5): 400 mg via INTRAVENOUS
  Filled 2024-08-27 (×6): qty 200

## 2024-08-27 MED ORDER — SALINE SPRAY 0.65 % NA SOLN
1.0000 | NASAL | Status: DC | PRN
  Administered 2024-08-27: 1 via NASAL
  Filled 2024-08-27: qty 44

## 2024-08-27 MED ORDER — HYDROMORPHONE HCL 1 MG/ML IJ SOLN
1.0000 mg | INTRAMUSCULAR | Status: DC | PRN
  Administered 2024-08-27 – 2024-09-02 (×24): 1 mg via INTRAVENOUS
  Filled 2024-08-27 (×25): qty 1

## 2024-08-27 MED ORDER — POLYVINYL ALCOHOL 1.4 % OP SOLN
2.0000 [drp] | OPHTHALMIC | Status: DC | PRN
  Filled 2024-08-27: qty 15

## 2024-08-27 NOTE — Plan of Care (Signed)
   Problem: Activity: Goal: Risk for activity intolerance will decrease Outcome: Progressing   Problem: Coping: Goal: Level of anxiety will decrease Outcome: Progressing   Problem: Pain Managment: Goal: General experience of comfort will improve and/or be controlled Outcome: Progressing

## 2024-08-27 NOTE — Progress Notes (Addendum)
 HEMATOLOGY/ONCOLOGY INPATIENT PROGRESS NOTE:   ADDENDUM:  Patient was personally and independently interviewed, examined and relevant elements of the history of present illness were reviewed in details and an assessment and plan was created. All elements of the patient's history of present illness, assessment and plan were discussed in detail with Douglas JINNY Brunner, NP. Her documentation reflects our combined findings assessment and plan.   Briefly, 64 year old gentleman, who is well-known to our service for his history of squamous cell carcinoma of left tonsil, with recently diagnosed liver metastatic disease, started cycle 1 of chemoimmunotherapy with carboplatin , 5-FU, pembrolizumab  starting from 08/18/2024.  Following the initial cycle of 5-fluorouracil  (four-day infusion via pump) and pembrolizumab , he developed severe oral pain, mucositis, and oropharyngeal candidiasis. Symptoms include intense, persistent oral and throat pain, marked odynophagia, dysphagia, and a sensation of airway narrowing. Oral pain is refractory to lidocaine  mouthwash and morphine /fentanyl , with only partial, transient relief from Dilaudid ; pain rapidly recurs as the effect of Dilaudid  wears off.  He reports significant difficulty swallowing, requiring pauses during conversation, and describes mouth and throat soreness, erythema, and a sensation of the throat just closing up. Despite ongoing antifungal therapy, Magic Mouthwash, salt and baking soda rinses, and other home remedies, symptoms have not improved. He has been unable to eat adequately, resulting in weight loss to 168 pounds.  Due to severe weather, he initially attempted home management but presented to the hospital when symptoms worsened.  Oral candidiasis due to antineoplastic chemotherapy Severe oral candidiasis developed following the initial chemotherapy cycle, presenting with intense oral pain, persistent thrush, and significant dysphagia. Symptoms have been  refractory to topical anesthetics, antifungal therapy, and supportive measures, raising concern for increased sensitivity to chemotherapy. Ongoing pain and dysphagia necessitated hospitalization and IV hydration. Pain control with Dilaudid  remains partial, and oral intake is markedly reduced. - Please continue IV antifungal therapy for at least few more days until his swallowing improves, later we can switch to oral fluconazole  for total duration of at least 10 days. - Continued pain management with Dilaudid  as needed. - Receiving supportive care including hydration and IV fluids.  He developed severe mucositis and oral candidiasis after the first cycle, exceeding expected toxicity and suggesting possible increased sensitivity to 5FU. Hematologic parameters remain stable.  We will analyze for DYPD deficiency affecting 5FU metabolism, as this may account for heightened toxicity. Keytruda  is unlikely the primary cause of oral symptoms, though a minor contribution cannot be excluded. Dose reduction of 5FU is planned for subsequent cycles, and if enzyme deficiency is confirmed, a switch to an alternative chemotherapy agent will be considered while continuing immunotherapy. - We will arrange for Guardant 360 blood test post-discharge to assess for enzyme deficiency affecting 5FU metabolism. - Planned reduction of 5FU dose for the next chemotherapy cycle. - Plan to continue pembrolizumab  at standard fixed dose.  Will continue to follow.  Please call us  with any questions or concerns.  We appreciate the assistance of hospitalist team and other services in his management.  Thank you, Douglas Patten, MD   Douglas Hester   DOB:08-17-1960   FM#:[id number]      CLINICAL SUMMARY:  Douglas Hester is a 64 year old male patient admitted on 08/27/2023 from home with complaints of oral pain.  Oncologic history is significant for laryngeal squamous carcinoma with liver mets.  Medical oncology  following.    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 - Diagnosed with liver mets 07/03/2024.  Subsequent PET scan confirmed hepatic lesions. - Started on maintenance pembrolizumab  every 3 weeks, cycle 1 on 08/18/2024. - Medical oncology/Dr. Charmane Hester following closely.  Oral thrush/acute oral and throat pain Dysphagia - Patient reports that it is difficult to swallow and very painful. - Continue Decadron , and IV Diflucan  as ordered - Continue oral rinses as ordered - Continue pain medication regimen with Dilaudid  as ordered. - IV fluids as ordered  Anemia Leukopenia - Mild.  Hemoglobin 11.1.  WBC 3.8, ANC 5.1 WNL - No transfusional intervention required - Continue to monitor CBC with differential    Code Status Full  Subjective:  Patient seen awake and alert laying in bed.  Denies chest pain.  Admits to oral pain and throat pain.  Also complains of dry nostrils.  Family member at bedside.  Objective:   Intake/Output Summary (Last 24 hours) at 08/27/2024 1009 Last data filed at 08/27/2024 0300 Gross per 24 hour  Intake 2014.01 ml  Output --  Net 2014.01 ml     PHYSICAL EXAMINATION: ECOG PERFORMANCE STATUS: 3 - Symptomatic, >50% confined to bed  Vitals:   08/26/24 2327 08/27/24 0545  BP: 106/75 113/81  Pulse: 93 80  Resp: 18 18  Temp: 99 F (37.2 C) 98.3 F (36.8 C)  SpO2: 98% 97%   Filed Weights   08/26/24 1041  Weight: 165 lb (74.8 kg)    GENERAL: alert, no distress and comfortable SKIN: skin color, texture, turgor are normal, no rashes or significant lesions EYES: normal, conjunctiva are pink and non-injected, sclera clear OROPHARYNX: + Whitish lesions all over patient's tongue and oral cavity NECK: supple, thyroid  normal size, non-tender, without nodularity LYMPH: no palpable lymphadenopathy in the cervical, axillary or  inguinal LUNGS: clear to auscultation and percussion with normal breathing effort HEART: regular rate & rhythm and no murmurs and no lower extremity edema ABDOMEN: abdomen soft, non-tender and normal bowel sounds MUSCULOSKELETAL: no cyanosis of digits and no clubbing  PSYCH: alert & oriented x 3 with fluent speech NEURO: no focal motor/sensory deficits   All questions were answered. The patient knows to call the clinic with any problems, questions or concerns.   I personally spent a total of 40 minutes minutes in the care of the patient today including preparing to see the patient, getting/reviewing separately obtained history, performing a medically appropriate exam/evaluation, placing orders, referring and communicating with other health care professionals, documenting clinical information in the EHR, communicating results, and coordinating care.    Douglas PARAS Rouson, NP 08/27/2024 10:09 AM    Labs Reviewed:  Lab Results  Component Value Date   WBC 3.8 (L) 08/27/2024   HGB 11.1 (L) 08/27/2024   HCT 35.5 (L) 08/27/2024   MCV 88.5 08/27/2024   PLT 256 08/27/2024   Recent Labs    08/15/24 1130 08/26/24 1101 08/27/24 0723  NA 137 136 136  K 4.6 3.6 4.4  CL 99 97* 101  CO2 27 26 27   GLUCOSE 107* 125* 133*  BUN 11 15 11   CREATININE 0.85 0.91 0.64  CALCIUM 10.0 9.5 9.7  GFRNONAA >60 >60 >60  PROT 8.1 7.2 6.7  ALBUMIN 4.1 4.0 3.7  AST 45* 33 26  ALT 79* 65* 60*  ALKPHOS 298* 200* 193*  BILITOT 0.8 0.8 0.7    Studies Reviewed:   IR IMAGING GUIDED PORT INSERTION Result Date: 08/15/2024 CLINICAL DATA:  Metastatic squamous carcinoma of the left tonsil and need for porta cath  for chemotherapy EXAM: IMPLANTED PORT A CATH PLACEMENT WITH ULTRASOUND AND FLUOROSCOPIC GUIDANCE ANESTHESIA/SEDATION: Moderate (conscious) sedation was employed during this procedure. A total of Versed  4.0 mg and Fentanyl  100 mcg was administered intravenously. Moderate Sedation Time: 30 minutes. The  patient's level of consciousness and vital signs were monitored continuously by radiology nursing throughout the procedure under my direct supervision. FLUOROSCOPY: Radiation Exposure Index: 1.0 mGy Kerma PROCEDURE: The procedure, risks, benefits, and alternatives were explained to the patient. Questions regarding the procedure were encouraged and answered. The patient understands and consents to the procedure. A time-out was performed prior to initiating the procedure. Ultrasound was utilized to confirm patency of the right internal jugular vein. An ultrasound image was saved and recorded. The right neck and chest were prepped with chlorhexidine  in a sterile fashion, and a sterile drape was applied covering the operative field. Maximum barrier sterile technique with sterile gowns and gloves were used for the procedure. Local anesthesia was provided with 1% lidocaine . After creating a small venotomy incision, a 21 gauge needle was advanced into the right internal jugular vein under direct, real-time ultrasound guidance. Ultrasound image documentation was performed. After securing guidewire access, an 8 Fr dilator was placed. A J-wire was kinked to measure appropriate catheter length. A subcutaneous port pocket was then created along the upper chest wall utilizing sharp and blunt dissection. Portable cautery was utilized. The pocket was irrigated with sterile saline. A single lumen power injectable port was chosen for placement. The 8 Fr catheter was tunneled from the port pocket site to the venotomy incision. The port was placed in the pocket. External catheter was trimmed to appropriate length based on guidewire measurement. At the venotomy, an 8 Fr peel-away sheath was placed over a guidewire. The catheter was then placed through the sheath and the sheath removed. Final catheter positioning was confirmed and documented with a fluoroscopic spot image. The port was accessed with a needle and aspirated and flushed  with heparinized saline. The access needle was removed. The venotomy and port pocket incisions were closed with subcutaneous 3-0 Monocryl and subcuticular 4-0 Vicryl. Dermabond was applied to both incisions. COMPLICATIONS: COMPLICATIONS None FINDINGS: After catheter placement, the tip lies at the cavo-atrial junction. The catheter aspirates normally and is ready for immediate use. IMPRESSION: Placement of single lumen port a cath via right internal jugular vein. The catheter tip lies at the cavo-atrial junction. A power injectable port a cath was placed and is ready for immediate use. Electronically Signed   By: Marcey Moan M.D.   On: 08/15/2024 17:20   US  BIOPSY (LIVER) Result Date: 08/05/2024 INDICATION: 64 year old with squamous cell carcinoma of the left tonsil. Patient has multiple suspicious liver lesions. Tissue diagnosis is needed. EXAM: ULTRASOUND-GUIDED LIVER LESION BIOPSY MEDICATIONS: Moderate sedation ANESTHESIA/SEDATION: Moderate (conscious) sedation was employed during this procedure. A total of Versed  2 mg and Fentanyl  100 mcg was administered intravenously by the radiology nurse. Total intra-service moderate Sedation Time: 26 minutes. The patient's level of consciousness and vital signs were monitored continuously by radiology nursing throughout the procedure under my direct supervision. FLUOROSCOPY TIME:  None COMPLICATIONS: None immediate. PROCEDURE: Informed written consent was obtained from the patient after a thorough discussion of the procedural risks, benefits and alternatives. All questions were addressed. A timeout was performed prior to the initiation of the procedure. Liver was evaluated ultrasound. A right hepatic lesion was identified and targeted. The right side of the abdomen was prepped with chlorhexidine  and sterile field was created. Skin was  anesthetized using 1% lidocaine . Small incision was made. Using ultrasound guidance, 17 gauge coaxial needle was directed into the right  hepatic lesion. Total of 3 core biopsies were performed with an 18 gauge core device. Three adequate specimens obtained and placed in formalin. Gel-Foam slurry was injected as the 17 gauge needle was removed. Bandage placed over the puncture site. FINDINGS: Subtle heterogeneous liver lesions. Partially necrotic or cystic lesion in the right hepatic lobe was targeted. Biopsy needle confirmed within the lesion. No immediate bleeding or hematoma formation. IMPRESSION: Successful ultrasound-guided core biopsies from a right hepatic lesion. Electronically Signed   By: Juliene Balder M.D.   On: 08/05/2024 12:46

## 2024-08-27 NOTE — Progress Notes (Signed)
 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  Patient has been using oxycodone  and Magic mouthwash with lidocaine  without any improvement.  He has only been able to tolerate liquids and has started having trouble swallowing his medications.  He denies any abdominal pain nausea vomiting diarrhea. Significant labs include ALT 65, alkaline phosphatase 200, rest of the labs within normal limits.  Patient was admitted for further evaluation and started on adequate pain control.  Oncology is consulted.  Assessment & Plan:   Principal Problem:   Acute oral pain Active Problems:   Nausea   Cancer related pain   Essential hypertension   GERD (gastroesophageal reflux disease)   Bipolar I disorder, most recent episode depressed (HCC)   Squamous cell carcinoma of left tonsil (HCC)   Opioid-induced pruritus   RLS (restless legs syndrome)  Acute oral pain: Patient has developed pain following first cycle of chemotherapy for stage IV tonsillar cancer.   He complains of severe oral pain and ulcerations, inability to tolerate oral intake.   Labs reassuring at time of admission. Continue maintenance IV fluids. Continue IV analgesics as needed. Continue Viscous lidocaine  every 4 hours as needed Patient initiated on dexamethasone  as per Dr. Autumn.    Nausea: Suspect it could be side effect of chemotherapy.   Patient denies vomiting but has had intermittent nausea. Continue IV antiemetics as  needed. Continue IV hydration.   Essential hypertension BP was 140/83 in the ED, mildly elevated in setting of pain. Continue IV hydralazine  PRN for now Toprol  XL switched to IV metoprolol  for now.   Cancer related pain: Continue oxycodone  10 mg Q4H PRN. Currently with acute oral pain as above. Titrate pain regimen Consider IV analgesics PRN per orders when not tolerating PO meds and for breakthrough pain.   Bipolar I disorder, most recent episode depressed [facility]) Psychiatry follow up of 07/17/2024 reviewed.  Continue  Lamictal  and Wellbutrin  The patient is unable to swallow pills now. --Per note, is no longer taking Paxil, trazodone , mirtazapine    GERD (gastroesophageal reflux disease): IV PPI for now.   Opioid-induced pruritus: IV benadryl  PRN or if tolerating PO meds, home hydroxyzine  PRN   Squamous cell carcinoma of left tonsil (HCC) Status post radiation treatment, during which time he was on tube feeds by G-tube.   Liver mets discovered on surveillance imaging in December. Recently started induction regimen with carboplatin , 5-FU, and pembrolizumab  (Keytruda ) on 08/18/2024.   Follows with Dr. Conchetta.  EDP discussed case with on-call oncologist. He recommended admission for pain control, IV fluids. Consider Palliative care consultation.   DVT prophylaxis: Lovenox  Code Status: Full code Family Communication: No family at bedside. Disposition Plan:  Status is: Inpatient Remains inpatient appropriate because: Admitted for acute oral pain, inability to tolerate liquids and solids secondary to tonsillar carcinoma. Oncology is consulted.   Patient is not medically ready for discharge.  Consultants:  Oncology  Procedures:  Antimicrobials:  Anti-infectives (From admission, onward)    Start     Dose/Rate Route Frequency Ordered  Stop   08/27/24 1300  fluconazole  (DIFLUCAN ) IVPB 400 mg        400 mg 100 mL/hr over 120 Minutes Intravenous Every 24 hours 08/27/24 1113  09/03/24 1259      Subjective: Patient was seen and examined at bedside.  Overnight events noted. Patient reports pain is reasonably controlled with IV Dilaudid ,  asking to continue with same medications.  Objective: Vitals:   08/26/24 1508 08/26/24 1908 08/26/24 2327 08/27/24 0545  BP: 118/74 121/73 106/75 113/81  Pulse: 95 95 93 80  Resp: (!) 21  18 18   Temp: 98.4 F (36.9 C) 98.8 F (37.1 C) 99 F (37.2 C) 98.3 F (36.8 C)  TempSrc: Oral Oral Oral Oral  SpO2: 100% 99% 98% 97%  Weight:      Height:        Intake/Output Summary (Last 24 hours) at 08/27/2024 1300 Last data filed at 08/27/2024 0300 Gross per 24 hour  Intake 1014.01 ml  Output --  Net 1014.01 ml   Filed Weights   08/26/24 1041  Weight: 74.8 kg    Examination:  General exam: Appears calm and comfortable, not in any acute distress. Respiratory system: CTA Bilaterally. Respiratory effort normal.  RR 14 Cardiovascular system: S1 & S2 heard, RRR. No JVD, murmurs, rubs, gallops or clicks.  Gastrointestinal system: Abdomen is non distended, soft and non tender. Normal bowel sounds heard. Central nervous system: Alert and oriented x 3. No focal neurological deficits. Extremities: No edema, no cyanosis, no clubbing. Skin: No rashes, lesions or ulcers Psychiatry: Judgement and insight appear normal. Mood & affect appropriate.   Data Reviewed: I have personally reviewed following labs and imaging studies  CBC: Recent Labs  Lab 08/26/24 1101 08/27/24 0723  WBC 5.8 3.8*  NEUTROABS 5.1  --   HGB 12.0* 11.1*  HCT 37.1* 35.5*  MCV 86.1 88.5  PLT 269 256   Basic Metabolic Panel: Recent Labs  Lab 08/26/24 1101 08/27/24 0723  NA 136 136  K 3.6 4.4  CL 97* 101  CO2 26 27  GLUCOSE 125* 133*  BUN 15 11  CREATININE 0.91 0.64  CALCIUM 9.5 9.7   GFR: Estimated Creatinine Clearance: 94.5 mL/min (by C-G formula based on SCr of 0.64 mg/dL). Liver Function Tests: Recent Labs  Lab 08/26/24 1101  08/27/24 0723  AST 33 26  ALT 65* 60*  ALKPHOS 200* 193*  BILITOT 0.8 0.7  PROT 7.2 6.7  ALBUMIN 4.0 3.7   Recent Labs  Lab 08/26/24 1101  LIPASE 21   No results for input(s): AMMONIA in the last 168 hours. Coagulation Profile: No results for input(s): INR, PROTIME in the last 168 hours. Cardiac Enzymes: No results for input(s): CKTOTAL, CKMB, CKMBINDEX, TROPONINI in the last 168 hours. BNP (last 3 results) No results for input(s): PROBNP in the last 8760 hours. HbA1C: No results for input(s): HGBA1C in the last 72 hours. CBG: No results for input(s): GLUCAP in the last 168 hours. Lipid Profile: No results for input(s): CHOL, HDL, LDLCALC, TRIG, CHOLHDL, LDLDIRECT in the last 72 hours. Thyroid  Function Tests: No results for input(s): TSH, T4TOTAL, FREET4, T3FREE, THYROIDAB in the last 72 hours. Anemia Panel: No results for input(s): VITAMINB12, FOLATE, FERRITIN, TIBC, IRON, RETICCTPCT in the last 72 hours. Sepsis Labs: No results for input(s): PROCALCITON, LATICACIDVEN in the last 168 hours.  No results found for this or any previous visit (from the past 240 hours).   Radiology Studies: No results found.  Scheduled Meds:  buPROPion   150 mg Oral Daily   enoxaparin  (LOVENOX ) injection  40 mg Subcutaneous Q24H   lamoTRIgine   150 mg Oral Daily   metoprolol  tartrate  5 mg Intravenous Q8H   pantoprazole  (PROTONIX ) IV  40 mg Intravenous Q12H   rOPINIRole   0.5 mg Oral QHS   Continuous Infusions:  fluconazole  (DIFLUCAN ) IV     lactated ringers  75 mL/hr at 08/26/24 1321     LOS: 1 day    Time spent: 50 mins    Darcel Dawley, MD [facility]   If 7PM-7AM, please contact night-coverage  "

## 2024-08-28 ENCOUNTER — Ambulatory Visit

## 2024-08-28 DIAGNOSIS — T402X5A Adverse effect of other opioids, initial encounter: Secondary | ICD-10-CM

## 2024-08-28 DIAGNOSIS — K1379 Other lesions of oral mucosa: Secondary | ICD-10-CM | POA: Diagnosis not present

## 2024-08-28 DIAGNOSIS — L2989 Other pruritus: Secondary | ICD-10-CM

## 2024-08-28 DIAGNOSIS — K1230 Oral mucositis (ulcerative), unspecified: Secondary | ICD-10-CM | POA: Diagnosis not present

## 2024-08-28 DIAGNOSIS — G893 Neoplasm related pain (acute) (chronic): Secondary | ICD-10-CM

## 2024-08-28 DIAGNOSIS — K5903 Drug induced constipation: Secondary | ICD-10-CM

## 2024-08-28 DIAGNOSIS — C099 Malignant neoplasm of tonsil, unspecified: Secondary | ICD-10-CM | POA: Diagnosis not present

## 2024-08-28 MED ORDER — NUTRISOURCE FIBER PO PACK
1.0000 | PACK | Freq: Two times a day (BID) | ORAL | Status: DC
  Administered 2024-08-28 – 2024-09-02 (×11): 1 via ORAL
  Filled 2024-08-28 (×11): qty 1

## 2024-08-28 NOTE — Progress Notes (Signed)
 "  [facility]  HEMATOLOGY/ONCOLOGY IN-PATIENT PROGRESS NOTE   PATIENT NAME: Douglas Hester   MR#: [id number] DOB: 1960/10/14 CSN#: [id number]   DATE OF SERVICE: 08/28/2024  ASSESSMENT & PLAN:   Douglas Hester is a 64 y.o. 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  Because of stage I disease, plan made to proceed with definitive radiation therapy only.   Unfortunately he had recurrence of disease with liver metastatic disease was noted on CT abdomen and pelvis on 07/03/2024, later biopsy-proven.  Stage IV disease.  Currently admitted after cycle 1 of chemoimmunotherapy for oral mucositis, candidiasis, inability to eat.  Oral mucositis due to chemotherapy Oral mucositis persists with slight improvement under current management. Pain remains significant, restricting oral intake to liquids and soft foods. He maintains some nutrition and hydration. Mucositis is attributed to 5-fluorouracil  toxicity. Steroids are withheld to avoid exacerbating oropharyngeal candidiasis. - Continued current pain management regimen, including Dilaudid  as needed. - Continue to oral intake and encouraged nutritional support with Ensure and soups.  Chemotherapy-induced leukopenia Leukopenia (WBC 3,800) is present, expected post-chemotherapy. Neutrophil count was normal yesterday; ongoing monitoring is planned. Low suspicion for DPD enzyme deficiency, but testing will be pursued in the outpatient setting due to severe mucositis. He is at risk for further cytopenias over the next 2-3 days before anticipated recovery. Dose reduction of 5-fluorouracil  is planned for the next cycle due to increased sensitivity. - Will monitor CBCD daily and consider Granix if ANC goes below 1000.  Oropharyngeal  candidiasis Oropharyngeal candidiasis is present, secondary to chemotherapy-induced immunosuppression. Steroids are withheld to prevent worsening of candidiasis. - Continued current antifungal therapy with IV Diflucan  400 mg, once daily dosing. - Avoided further steroid administration.  Constipation Constipation is ongoing, multifactorial due to decreased oral intake, opioid use, and limited mobility. He has had two episodes of impaction, with associated urinary difficulty during severe constipation. Current management includes daily Miralax ; fiber supplementation is being added. Physical therapy involvement is anticipated to support mobility. - Added fiber supplement (e.g., Metamucil) to nutritional intake, mixed with liquids.  Pruritus due to opioid therapy Pruritus is present, attributed to opioid therapy (oxycodone  and Dilaudid ). Benadryl  is used for symptomatic relief and is appropriate for opioid-induced pruritus. - Continue Benadryl  as needed for pruritus.  We appreciate the assistance of hospitalist team and other specialties in his care.  Please call us  with any questions.  Thank you, [name], MD 08/28/2024 12:32 PM  PERTINENT HISTORY:  64 year old gentleman, who is well-known to our service for his history of squamous cell carcinoma of left tonsil, with recently diagnosed liver metastatic disease, started cycle 1 of chemoimmunotherapy with carboplatin , 5-FU, pembrolizumab  starting from 08/18/2024.   Following the initial cycle of 5-fluorouracil  (four-day infusion via pump) and pembrolizumab , he developed severe oral pain, mucositis, and oropharyngeal candidiasis. Symptoms include intense, persistent oral and throat pain, marked odynophagia, dysphagia, and a sensation of airway narrowing. Oral pain is refractory to lidocaine  mouthwash and morphine /fentanyl  patch.  Due to severe weather, he initially attempted home management but presented to the hospital when symptoms worsened.    Found to have oral candidiasis and was started on IV Diflucan  starting from 08/27/2024.  SUBJECTIVE:   He continues to experience persistent, painful oral mucositis since his most recent cycle of 5-fluorouracil -based chemotherapy. The pain is fluctuating,  with intermittent improvement but recurrent severe discomfort, partially alleviated by oxycodone  and hydromorphone . Oral intake is limited to liquids, including milk, juice, broth, and Ensure, which he times with analgesic administration for tolerability. Solid foods are not tolerated due to pain, though he is able to maintain hydration with water.  Oropharyngeal candidiasis is being treated with daily intravenous antifungal therapy. Duloxetine is continued for adjunctive symptom management. Steroids have been withheld to avoid exacerbating candidiasis.  His current white blood cell count is 3,800 (baseline 4,000). Neutrophil count was normal yesterday, with ongoing monitoring planned. A test for dihydropyrimidine dehydrogenase (DPD) deficiency will be performed after discharge.  Constipation remains problematic, with two recent episodes of fecal impaction and associated urinary difficulty during severe episodes. He uses daily polyethylene glycol and is adding fiber supplementation. He is ambulatory and able to perform hygiene tasks, but experiences leg weakness and a sensation of wobbly legs after these activities. Physical therapy involvement is anticipated to support mobility.  He continues to experience pruritus, attributed to opioid therapy, and receives diphenhydramine  for symptomatic relief. He is otherwise tolerating his current supportive care regimen.  OBJECTIVE:  Vitals:   08/27/24 2133 08/28/24 0456  BP: 120/81 117/72  Pulse: 86 81  Resp: 18 18  Temp: 97.8 F (36.6 C) 97.9 F (36.6 C)  SpO2: 99% 98%     Intake/Output Summary (Last 24 hours) at 08/28/2024 1232 Last data filed at 08/28/2024 9360 Gross per 24 hour  Intake  2396.94 ml  Output 800 ml  Net 1596.94 ml    Physical Exam Constitutional:      General: He is not in acute distress.    Appearance: Normal appearance.  HENT:     Head: Normocephalic and atraumatic.     Mouth/Throat:     Comments: Evidence of mucositis and oral thrush, especially in the bottom of the tongue, improved compared to prior Eyes:     Conjunctiva/sclera: Conjunctivae normal.  Cardiovascular:     Rate and Rhythm: Normal rate and regular rhythm.  Pulmonary:     Effort: Pulmonary effort is normal. No respiratory distress.  Abdominal:     General: There is no distension.  Neurological:     General: No focal deficit present.     Mental Status: He is alert and oriented to person, place, and time.  Psychiatric:        Mood and Affect: Mood normal.        Behavior: Behavior normal.     LABS:   No results found for this or any previous visit (from the past 24 hours).   IMAGING STUDIES:   IR IMAGING GUIDED PORT INSERTION Result Date: 08/15/2024 CLINICAL DATA:  Metastatic squamous carcinoma of the left tonsil and need for porta cath for chemotherapy EXAM: IMPLANTED PORT A CATH PLACEMENT WITH ULTRASOUND AND FLUOROSCOPIC GUIDANCE ANESTHESIA/SEDATION: Moderate (conscious) sedation was employed during this procedure. A total of Versed  4.0 mg and Fentanyl  100 mcg was administered intravenously. Moderate Sedation Time: 30 minutes. The patient's level of consciousness and vital signs were monitored continuously by radiology nursing throughout the procedure under my direct supervision. FLUOROSCOPY: Radiation Exposure Index: 1.0 mGy Kerma PROCEDURE: The procedure, risks, benefits, and alternatives were explained to the patient. Questions regarding the procedure were encouraged and answered. The patient understands and consents to the procedure. A time-out was performed prior to initiating the procedure. Ultrasound was utilized to confirm patency of the right internal jugular vein. An  ultrasound image was saved and recorded. The right neck and  chest were prepped with chlorhexidine  in a sterile fashion, and a sterile drape was applied covering the operative field. Maximum barrier sterile technique with sterile gowns and gloves were used for the procedure. Local anesthesia was provided with 1% lidocaine . After creating a small venotomy incision, a 21 gauge needle was advanced into the right internal jugular vein under direct, real-time ultrasound guidance. Ultrasound image documentation was performed. After securing guidewire access, an 8 Fr dilator was placed. A J-wire was kinked to measure appropriate catheter length. A subcutaneous port pocket was then created along the upper chest wall utilizing sharp and blunt dissection. Portable cautery was utilized. The pocket was irrigated with sterile saline. A single lumen power injectable port was chosen for placement. The 8 Fr catheter was tunneled from the port pocket site to the venotomy incision. The port was placed in the pocket. External catheter was trimmed to appropriate length based on guidewire measurement. At the venotomy, an 8 Fr peel-away sheath was placed over a guidewire. The catheter was then placed through the sheath and the sheath removed. Final catheter positioning was confirmed and documented with a fluoroscopic spot image. The port was accessed with a needle and aspirated and flushed with heparinized saline. The access needle was removed. The venotomy and port pocket incisions were closed with subcutaneous 3-0 Monocryl and subcuticular 4-0 Vicryl. Dermabond was applied to both incisions. COMPLICATIONS: COMPLICATIONS None FINDINGS: After catheter placement, the tip lies at the cavo-atrial junction. The catheter aspirates normally and is ready for immediate use. IMPRESSION: Placement of single lumen port a cath via right internal jugular vein. The catheter tip lies at the cavo-atrial junction. A power injectable port a cath was placed  and is ready for immediate use. Electronically Signed   By: Marcey Moan M.D.   On: 08/15/2024 17:20   US  BIOPSY (LIVER) Result Date: 08/05/2024 INDICATION: 64 year old with squamous cell carcinoma of the left tonsil. Patient has multiple suspicious liver lesions. Tissue diagnosis is needed. EXAM: ULTRASOUND-GUIDED LIVER LESION BIOPSY MEDICATIONS: Moderate sedation ANESTHESIA/SEDATION: Moderate (conscious) sedation was employed during this procedure. A total of Versed  2 mg and Fentanyl  100 mcg was administered intravenously by the radiology nurse. Total intra-service moderate Sedation Time: 26 minutes. The patient's level of consciousness and vital signs were monitored continuously by radiology nursing throughout the procedure under my direct supervision. FLUOROSCOPY TIME:  None COMPLICATIONS: None immediate. PROCEDURE: Informed written consent was obtained from the patient after a thorough discussion of the procedural risks, benefits and alternatives. All questions were addressed. A timeout was performed prior to the initiation of the procedure. Liver was evaluated ultrasound. A right hepatic lesion was identified and targeted. The right side of the abdomen was prepped with chlorhexidine  and sterile field was created. Skin was anesthetized using 1% lidocaine . Small incision was made. Using ultrasound guidance, 17 gauge coaxial needle was directed into the right hepatic lesion. Total of 3 core biopsies were performed with an 18 gauge core device. Three adequate specimens obtained and placed in formalin. Gel-Foam slurry was injected as the 17 gauge needle was removed. Bandage placed over the puncture site. FINDINGS: Subtle heterogeneous liver lesions. Partially necrotic or cystic lesion in the right hepatic lobe was targeted. Biopsy needle confirmed within the lesion. No immediate bleeding or hematoma formation. IMPRESSION: Successful ultrasound-guided core biopsies from a right hepatic lesion. Electronically  Signed   By: Juliene Balder M.D.   On: 08/05/2024 12:46    "

## 2024-08-28 NOTE — Progress Notes (Signed)
 " PROGRESS NOTE    Douglas Hester  FMW:[id number] DOB: 03-31-1961 DOA: 08/26/2024 PCP: Katina Pfeiffer, PA-C   Brief Narrative: This 64 yrs old male with medical history significant for stage IV  squamous cell carcinoma of left tonsil with liver mets undergoing radiation therapy, history of G-tube, history of due to cellulitis following G tube placement which was subsequently taken off due to infection who presented to the emergency department with oral pain and ulcers after starting chemotherapy on Monday that consisted of a 4 day infusion at home.  Patient has been using oxycodone  and Magic mouthwash with lidocaine  without any improvement.  He has only been able to tolerate liquids and has started having trouble swallowing his medications.  He denies any abdominal pain nausea vomiting diarrhea. Significant labs include ALT 65, alkaline phosphatase 200, rest of the labs within normal limits.  Patient was admitted for further evaluation and started on adequate pain control.  Oncology is consulted.  Assessment & Plan:   Principal Problem:   Acute oral pain Active Problems:   Nausea   Cancer related pain   Essential hypertension   GERD (gastroesophageal reflux disease)   Bipolar I disorder, most recent episode depressed (HCC)   Squamous cell carcinoma of left tonsil (HCC)   Opioid-induced pruritus   RLS (restless legs syndrome)   Mucositis oral  Acute oral pain: Mucositis and oral candidiasis: Patient has developed acute pain following first cycle of chemotherapy for stage IV tonsillar cancer.   He complains of severe oral pain and ulcerations, inability to tolerate oral intake.   Labs reassuring at time of admission. Continue maintenance IV fluids. Continue IV analgesics as needed. Continue Viscous lidocaine  every 4 hours as needed Patient initiated on dexamethasone  as per Dr. Autumn.  Continue IV fluconazole  for at least few more days until his swallowing improves,  later can be  switched to oral fluconazole  for total duration of 10 days.   Nausea: Suspect it could be side effect of chemotherapy.   Patient denies vomiting but has had intermittent nausea. Continue IV antiemetics as needed. Continue IV hydration.   Essential hypertension BP was 140/83 in the ED, mildly elevated in setting of pain. Continue IV hydralazine  PRN for now Toprol  XL switched to IV metoprolol  for now.   Cancer related pain: Continue oxycodone  10 mg Q4H PRN. Currently with acute oral pain as above. Titrate pain regimen Continue Dilaudid  1 mg IV every 4 hours as needed.   Bipolar I disorder, most recent episode depressed [facility]) Psychiatry follow up of 07/17/2024 reviewed.  Continue  Lamictal  and Wellbutrin  The patient is unable to swallow pills now. --Per note, is no longer taking Paxil, trazodone , mirtazapine    GERD (gastroesophageal reflux disease): IV PPI for now.   Opioid-induced pruritus: IV benadryl  PRN or if tolerating PO meds, home hydroxyzine  PRN   Squamous cell carcinoma of left tonsil (HCC) Status post radiation treatment, during which time he was on tube feeds by G-tube.   Liver mets discovered on surveillance imaging in December. Recently started induction regimen with carboplatin , 5-FU, and pembrolizumab  (Keytruda ) on 08/18/2024.   Follows with Dr. Conchetta.  EDP discussed case with on-call oncologist. He recommended admission for pain control, IV fluids. Consider Palliative care consultation.   DVT prophylaxis: Lovenox  Code Status: Full code Family Communication: No family at bedside. Disposition Plan:  Status is: Inpatient Remains inpatient appropriate because: Admitted for acute oral pain, inability to tolerate liquids and solids secondary to tonsillar carcinoma. Oncology is consulted.   Patient  is not medically ready for discharge.  Consultants:  Oncology  Procedures:  Antimicrobials:  Anti-infectives (From admission, onward)    Start     Dose/Rate  Route Frequency Ordered Stop   08/27/24 1300  fluconazole  (DIFLUCAN ) IVPB 400 mg        400 mg 100 mL/hr over 120 Minutes Intravenous Every 24 hours 08/27/24 1113 09/03/24 1259      Subjective: Patient was seen and examined at bedside. Overnight events noted. Patient reports pain is reasonably controlled with IV Dilaudid ,  asking to continue with same medications.  Objective: Vitals:   08/27/24 0545 08/27/24 1433 08/27/24 2133 08/28/24 0456  BP: 113/81 135/73 120/81 117/72  Pulse: 80 89 86 81  Resp: 18  18 18   Temp: 98.3 F (36.8 C) 97.7 F (36.5 C) 97.8 F (36.6 C) 97.9 F (36.6 C)  TempSrc: Oral  Oral Oral  SpO2: 97% 98% 99% 98%  Weight:      Height:        Intake/Output Summary (Last 24 hours) at 08/28/2024 1052 Last data filed at 08/28/2024 9360 Gross per 24 hour  Intake 2396.94 ml  Output 800 ml  Net 1596.94 ml   Filed Weights   08/26/24 1041  Weight: 74.8 kg    Examination:  General exam: Appears calm and comfortable, not in any acute distress. Respiratory system: CTA Bilaterally. Respiratory effort normal.  RR 15 Cardiovascular system: S1 & S2 heard, RRR. No JVD, murmurs, rubs, gallops or clicks.  Gastrointestinal system: Abdomen is non distended, soft and non tender. Normal bowel sounds heard. Central nervous system: Alert and oriented x 3. No focal neurological deficits. Extremities: No edema, no cyanosis, no clubbing. Skin: No rashes, lesions or ulcers Psychiatry: Judgement and insight appear normal. Mood & affect appropriate.   Data Reviewed: I have personally reviewed following labs and imaging studies  CBC: Recent Labs  Lab 08/26/24 1101 08/27/24 0723  WBC 5.8 3.8*  NEUTROABS 5.1  --   HGB 12.0* 11.1*  HCT 37.1* 35.5*  MCV 86.1 88.5  PLT 269 256   Basic Metabolic Panel: Recent Labs  Lab 08/26/24 1101 08/27/24 0723  NA 136 136  K 3.6 4.4  CL 97* 101  CO2 26 27  GLUCOSE 125* 133*  BUN 15 11  CREATININE 0.91 0.64  CALCIUM 9.5 9.7    GFR: Estimated Creatinine Clearance: 94.5 mL/min (by C-G formula based on SCr of 0.64 mg/dL). Liver Function Tests: Recent Labs  Lab 08/26/24 1101 08/27/24 0723  AST 33 26  ALT 65* 60*  ALKPHOS 200* 193*  BILITOT 0.8 0.7  PROT 7.2 6.7  ALBUMIN 4.0 3.7   Recent Labs  Lab 08/26/24 1101  LIPASE 21   No results for input(s): AMMONIA in the last 168 hours. Coagulation Profile: No results for input(s): INR, PROTIME in the last 168 hours. Cardiac Enzymes: No results for input(s): CKTOTAL, CKMB, CKMBINDEX, TROPONINI in the last 168 hours. BNP (last 3 results) No results for input(s): PROBNP in the last 8760 hours. HbA1C: No results for input(s): HGBA1C in the last 72 hours. CBG: No results for input(s): GLUCAP in the last 168 hours. Lipid Profile: No results for input(s): CHOL, HDL, LDLCALC, TRIG, CHOLHDL, LDLDIRECT in the last 72 hours. Thyroid  Function Tests: No results for input(s): TSH, T4TOTAL, FREET4, T3FREE, THYROIDAB in the last 72 hours. Anemia Panel: No results for input(s): VITAMINB12, FOLATE, FERRITIN, TIBC, IRON, RETICCTPCT in the last 72 hours. Sepsis Labs: No results for input(s): PROCALCITON, LATICACIDVEN in the  last 168 hours.  No results found for this or any previous visit (from the past 240 hours).   Radiology Studies: No results found.  Scheduled Meds:  buPROPion   150 mg Oral Daily   enoxaparin  (LOVENOX ) injection  40 mg Subcutaneous Q24H   lamoTRIgine   150 mg Oral Daily   metoprolol  tartrate  5 mg Intravenous Q8H   pantoprazole  (PROTONIX ) IV  40 mg Intravenous Q12H   rOPINIRole   0.5 mg Oral QHS   Continuous Infusions:  fluconazole  (DIFLUCAN ) IV 400 mg (08/27/24 1301)   lactated ringers  75 mL/hr at 08/28/24 0639     LOS: 2 days    Time spent: 50 mins    Darcel Dawley, MD [facility]   If 7PM-7AM, please contact night-coverage  "

## 2024-08-29 ENCOUNTER — Inpatient Hospital Stay: Admitting: Oncology

## 2024-08-29 ENCOUNTER — Inpatient Hospital Stay (HOSPITAL_COMMUNITY)

## 2024-08-29 ENCOUNTER — Inpatient Hospital Stay

## 2024-08-29 DIAGNOSIS — D701 Agranulocytosis secondary to cancer chemotherapy: Secondary | ICD-10-CM | POA: Diagnosis not present

## 2024-08-29 DIAGNOSIS — T451X5A Adverse effect of antineoplastic and immunosuppressive drugs, initial encounter: Secondary | ICD-10-CM

## 2024-08-29 DIAGNOSIS — K1379 Other lesions of oral mucosa: Secondary | ICD-10-CM | POA: Diagnosis not present

## 2024-08-29 DIAGNOSIS — C099 Malignant neoplasm of tonsil, unspecified: Secondary | ICD-10-CM | POA: Diagnosis not present

## 2024-08-29 DIAGNOSIS — K1230 Oral mucositis (ulcerative), unspecified: Secondary | ICD-10-CM | POA: Diagnosis not present

## 2024-08-29 LAB — CBC WITH DIFFERENTIAL/PLATELET
Abs Immature Granulocytes: 0 10*3/uL (ref 0.00–0.07)
Basophils Absolute: 0 10*3/uL (ref 0.0–0.1)
Basophils Relative: 0 %
Eosinophils Absolute: 0.1 10*3/uL (ref 0.0–0.5)
Eosinophils Relative: 5 %
HCT: 34.1 % — ABNORMAL LOW (ref 39.0–52.0)
Hemoglobin: 10.7 g/dL — ABNORMAL LOW (ref 13.0–17.0)
Immature Granulocytes: 0 %
Lymphocytes Relative: 26 %
Lymphs Abs: 0.3 10*3/uL — ABNORMAL LOW (ref 0.7–4.0)
MCH: 27.6 pg (ref 26.0–34.0)
MCHC: 31.4 g/dL (ref 30.0–36.0)
MCV: 87.9 fL (ref 80.0–100.0)
Monocytes Absolute: 0.4 10*3/uL (ref 0.1–1.0)
Monocytes Relative: 38 %
Neutro Abs: 0.3 10*3/uL — CL (ref 1.7–7.7)
Neutrophils Relative %: 31 %
Platelets: 183 10*3/uL (ref 150–400)
RBC: 3.88 MIL/uL — ABNORMAL LOW (ref 4.22–5.81)
RDW: 12.9 % (ref 11.5–15.5)
Smear Review: NORMAL
WBC: 1.1 10*3/uL — CL (ref 4.0–10.5)
nRBC: 0 % (ref 0.0–0.2)

## 2024-08-29 LAB — COMPREHENSIVE METABOLIC PANEL WITH GFR
ALT: 122 U/L — ABNORMAL HIGH (ref 0–44)
AST: 87 U/L — ABNORMAL HIGH (ref 15–41)
Albumin: 3.2 g/dL — ABNORMAL LOW (ref 3.5–5.0)
Alkaline Phosphatase: 235 U/L — ABNORMAL HIGH (ref 38–126)
Anion gap: 7 (ref 5–15)
BUN: 10 mg/dL (ref 8–23)
CO2: 29 mmol/L (ref 22–32)
Calcium: 8.9 mg/dL (ref 8.9–10.3)
Chloride: 94 mmol/L — ABNORMAL LOW (ref 98–111)
Creatinine, Ser: 0.67 mg/dL (ref 0.61–1.24)
GFR, Estimated: >60 mL/min
Glucose, Bld: 88 mg/dL (ref 70–99)
Potassium: 3.8 mmol/L (ref 3.5–5.1)
Sodium: 131 mmol/L — ABNORMAL LOW (ref 135–145)
Total Bilirubin: 0.5 mg/dL (ref 0.0–1.2)
Total Protein: 6.3 g/dL — ABNORMAL LOW (ref 6.5–8.1)

## 2024-08-29 MED ORDER — LACTATED RINGERS IV SOLN: INTRAVENOUS | Status: AC

## 2024-08-29 MED ORDER — ONDANSETRON HCL 4 MG/2ML IJ SOLN
4.0000 mg | Freq: Once | INTRAMUSCULAR | Status: AC
  Administered 2024-08-29: 4 mg via INTRAVENOUS
  Filled 2024-08-29: qty 2

## 2024-08-29 MED ORDER — ONDANSETRON HCL 4 MG/2ML IJ SOLN
4.0000 mg | Freq: Four times a day (QID) | INTRAMUSCULAR | Status: DC | PRN
  Administered 2024-08-29 – 2024-09-01 (×3): 4 mg via INTRAVENOUS
  Filled 2024-08-29 (×3): qty 2

## 2024-08-29 MED ORDER — FILGRASTIM-AAFI 300 MCG/0.5ML IJ SOSY
300.0000 ug | PREFILLED_SYRINGE | Freq: Every day | INTRAMUSCULAR | Status: DC
  Administered 2024-08-29 – 2024-08-31 (×3): 300 ug via SUBCUTANEOUS
  Filled 2024-08-29 (×3): qty 0.5

## 2024-08-29 MED ORDER — PHENOL 1.4 % MT LIQD
1.0000 | OROMUCOSAL | Status: DC | PRN
  Filled 2024-08-29: qty 177

## 2024-08-29 NOTE — Progress Notes (Signed)
 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  Patient has been using oxycodone  and Magic mouthwash with lidocaine  without any improvement.  He has only been able to tolerate liquids and has started having trouble swallowing his medications.  He denies any abdominal pain nausea vomiting diarrhea. Significant labs include ALT 65, alkaline phosphatase 200, rest of the labs within normal limits.  Patient was admitted for further evaluation and started on adequate pain control.  Oncology is consulted.  Assessment & Plan:   Principal Problem:   Acute oral pain Active Problems:   Nausea   Cancer related pain   Essential hypertension   GERD (gastroesophageal reflux disease)   Bipolar I disorder, most recent episode depressed (HCC)   Squamous cell carcinoma of left tonsil (HCC)   Opioid-induced pruritus   RLS (restless legs syndrome)   Mucositis oral   Drug-induced constipation   Chemotherapy induced neutropenia  Acute oral pain: Mucositis and oral candidiasis: Patient has developed acute pain following first cycle of chemotherapy for stage IV tonsillar cancer.   He complains of severe oral pain and ulcerations, inability to tolerate oral intake.   Labs reassuring at time of admission. Continue maintenance IV fluids. Continue IV analgesics as needed. Continue Viscous lidocaine  every 4 hours as needed Patient initiated on dexamethasone  as per Dr. Autumn.  Continue IV fluconazole  for at least  few more days until his swallowing improves,  later can be switched to oral fluconazole  for total duration of 10 days.  Nausea: Suspect it could be side effect of chemotherapy.   Patient denies vomiting but has had intermittent nausea. Continue IV antiemetics as needed. Continue IV hydration.   Essential hypertension BP was 140/83 in the ED, mildly elevated in setting of pain. Continue IV hydralazine  PRN for now Toprol  XL switched to IV metoprolol  for now.   Cancer related pain: Continue oxycodone  10 mg Q4H PRN. Currently with acute oral pain as above. Titrate pain regimen Continue Dilaudid  1 mg IV every 4 hours as needed.   Bipolar I disorder, most recent episode depressed [facility]) Psychiatry follow up of 07/17/2024 reviewed.  Continue  Lamictal  and Wellbutrin  The patient is unable to swallow pills now. --Per note, is no longer taking Paxil, trazodone , mirtazapine    GERD (gastroesophageal reflux disease): IV PPI for now.   Opioid-induced pruritus: IV benadryl  PRN or if tolerating PO meds, home hydroxyzine  PRN   Squamous cell carcinoma of left tonsil (HCC) Status post radiation treatment, during which time he was on tube feeds by G-tube.   Liver mets discovered on surveillance imaging in December. Recently started induction regimen with carboplatin , 5-FU, and pembrolizumab  (Keytruda ) on 08/18/2024.   Follows with Dr. Conchetta.  EDP discussed case with on-call oncologist. He recommended admission for pain control, IV fluids. Consider Palliative care consultation.  Neutropenia / Leukopenia :  Secondary to recent oncological therapy. WBC 1.1 with ANC 0.3 Patient started on Neupogen  4 doses. Monitor fever curve.  Neutropenic precautions.   DVT prophylaxis: Lovenox  Code Status: Full code Family  Communication: No family at bedside. Disposition Plan:  Status is: Inpatient Remains inpatient appropriate because: Admitted for acute oral pain, inability to tolerate liquids and solids  secondary to tonsillar carcinoma. Oncology is consulted.   Patient is not medically ready for discharge.  Consultants:  Oncology  Procedures:  Antimicrobials:  Anti-infectives (From admission, onward)    Start     Dose/Rate Route Frequency Ordered Stop   08/27/24 1300  fluconazole  (DIFLUCAN ) IVPB 400 mg        400 mg 100 mL/hr over 120 Minutes Intravenous Every 24 hours 08/27/24 1113 09/03/24 1259      Subjective: Patient was seen and examined at bedside. Overnight events noted. Patient reports pain is reasonably controlled with IV Dilaudid .  Objective: Vitals:   08/28/24 1421 08/28/24 2021 08/29/24 0432 08/29/24 1434  BP: 109/72 114/76 128/67 110/68  Pulse: 87 99 (!) 109 (!) 106  Resp: 20 18 18 15   Temp: 98.6 F (37 C) 98.9 F (37.2 C) 98.5 F (36.9 C) 99.1 F (37.3 C)  TempSrc: Oral Oral Oral Oral  SpO2: 99% 100% 99% 97%  Weight:      Height:        Intake/Output Summary (Last 24 hours) at 08/29/2024 1446 Last data filed at 08/29/2024 0527 Gross per 24 hour  Intake 966.79 ml  Output --  Net 966.79 ml   Filed Weights   08/26/24 1041  Weight: 74.8 kg    Examination:  General exam: Appears calm and comfortable, not in any acute distress. Respiratory system: CTA Bilaterally. Respiratory effort normal.  RR 14 Cardiovascular system: S1 & S2 heard, RRR. No JVD, murmurs, rubs, gallops or clicks.  Gastrointestinal system: Abdomen is non distended, soft and non tender. Normal bowel sounds heard. Central nervous system: Alert and oriented x 3. No focal neurological deficits. Extremities: No edema, no cyanosis, no clubbing. Skin: No rashes, lesions or ulcers Psychiatry: Judgement and insight appear normal. Mood & affect appropriate.   Data Reviewed: I have personally reviewed following labs and imaging studies  CBC: Recent Labs  Lab 08/26/24 1101 08/27/24 0723 08/29/24 0733  WBC 5.8 3.8* 1.1*  NEUTROABS 5.1  --  0.3*  HGB 12.0* 11.1* 10.7*  HCT 37.1* 35.5*  34.1*  MCV 86.1 88.5 87.9  PLT 269 256 183   Basic Metabolic Panel: Recent Labs  Lab 08/26/24 1101 08/27/24 0723 08/29/24 0733  NA 136 136 131*  K 3.6 4.4 3.8  CL 97* 101 94*  CO2 26 27 29   GLUCOSE 125* 133* 88  BUN 15 11 10   CREATININE 0.91 0.64 0.67  CALCIUM 9.5 9.7 8.9   GFR: Estimated Creatinine Clearance: 94.5 mL/min (by C-G formula based on SCr of 0.67 mg/dL). Liver Function Tests: Recent Labs  Lab 08/26/24 1101 08/27/24 0723 08/29/24 0733  AST 33 26 87*  ALT 65* 60* 122*  ALKPHOS 200* 193* 235*  BILITOT 0.8 0.7 0.5  PROT 7.2 6.7 6.3*  ALBUMIN 4.0 3.7 3.2*   Recent Labs  Lab 08/26/24 1101  LIPASE 21   No results for input(s): AMMONIA in the last 168 hours. Coagulation Profile: No results for input(s): INR, PROTIME in the last 168 hours. Cardiac Enzymes: No results for input(s): CKTOTAL, CKMB, CKMBINDEX, TROPONINI in the last 168 hours. BNP (last 3 results) No results for input(s): PROBNP in the last 8760 hours. HbA1C: No results for input(s): HGBA1C in the last 72 hours. CBG: No results for input(s): GLUCAP in the last 168 hours. Lipid Profile: No results for  input(s): CHOL, HDL, LDLCALC, TRIG, CHOLHDL, LDLDIRECT in the last 72 hours. Thyroid  Function Tests: No results for input(s): TSH, T4TOTAL, FREET4, T3FREE, THYROIDAB in the last 72 hours. Anemia Panel: No results for input(s): VITAMINB12, FOLATE, FERRITIN, TIBC, IRON, RETICCTPCT in the last 72 hours. Sepsis Labs: No results for input(s): PROCALCITON, LATICACIDVEN in the last 168 hours.  No results found for this or any previous visit (from the past 240 hours).   Radiology Studies: No results found.  Scheduled Meds:  buPROPion   150 mg Oral Daily   enoxaparin  (LOVENOX ) injection  40 mg Subcutaneous Q24H   fiber  1 packet Oral BID   filgrastim  (NIVESTYM ) SQ  300 mcg Subcutaneous q1800   lamoTRIgine   150 mg Oral Daily   metoprolol   tartrate  5 mg Intravenous Q8H   pantoprazole  (PROTONIX ) IV  40 mg Intravenous Q12H   rOPINIRole   0.5 mg Oral QHS   Continuous Infusions:  fluconazole  (DIFLUCAN ) IV 400 mg (08/29/24 1240)   lactated ringers  75 mL/hr at 08/29/24 0527     LOS: 3 days    Time spent: 50 mins    Darcel Dawley, MD [facility]   If 7PM-7AM, please contact night-coverage  "

## 2024-08-29 NOTE — Plan of Care (Signed)

## 2024-08-29 NOTE — Progress Notes (Signed)
 Critical result received from lab 1/30 0901. WBC 1.1, Abs neutrophil 0.3. No new orders.   08/29/24 9096  Provider Notification  Provider Name/Title Darcel Dawley, MD  Date Provider Notified 08/29/24  Time Provider Notified [phone]  Method of Notification Page  Notification Reason Critical Result  Test performed and critical result WBC 1.1, Absolute neutrophil 0.3  Date Critical Result Received 08/29/24  Time Critical Result Received 0901  Provider response No new orders  Date of Provider Response 08/29/24

## 2024-08-29 NOTE — Progress Notes (Addendum)
 HEMATOLOGY/ONCOLOGY INPATIENT PROGRESS NOTE:   ADDENDUM:  Patient was personally and independently interviewed, examined and relevant elements of the history of present illness were reviewed in details and an assessment and plan was created. All elements of the patient's history of present illness, assessment and plan were discussed in detail with Douglas Hester, Douglas Hester. Her documentation reflects our combined findings assessment and plan.   Briefly, 64 year old gentleman, who is well-known to our service for his history of squamous cell carcinoma of left tonsil, with recently diagnosed liver metastatic disease, started cycle 1 of chemoimmunotherapy with carboplatin , 5-FU, pembrolizumab  starting from 08/18/2024.   Following the initial cycle of 5-fluorouracil  (four-day infusion via pump) and pembrolizumab , he developed severe oral pain, mucositis, and oropharyngeal candidiasis. Symptoms include intense, persistent oral and throat pain, marked odynophagia, dysphagia, and a sensation of airway narrowing. Oral pain was refractory to lidocaine  mouthwash and morphine /fentanyl .  He was admitted to the hospital on 08/26/2024 for further management.  He has been on IV fluconazole  for oral candidiasis.  Receiving supportive care with Dilaudid  as needed and Magic mouthwash.  Symptoms slowly improving.  He has had progressive leukopenia/neutropenia with white count of 1100 and ANC 300 today.  Presumably from recent chemotherapy.  We started him on G-CSF support with short acting biosimilar at 300 mcg daily and plan for 3 to 4 days, until ANC is above thousand.  Continue supportive care.  Plan for testing for DPD deficiency in the outpatient setting, to see if he has increased toxicity from 5-FU.  Please call us  with any questions.  Thank you, Douglas Patten, MD   Douglas Hester   DOB:10-Apr-1961   FM#:[id number]      CLINICAL SUMMARY:  Douglas Hester is a 64 year old male who is well-known to our service for his  history of squamous cell carcinoma of left tonsil, with recently diagnosed liver metastatic disease, started cycle 1 of chemoimmunotherapy with carboplatin , 5-FU, pembrolizumab  starting from 08/18/2024.   INTERVAL HISTORY:  Following the initial cycle of 5-fluorouracil  (four-day infusion via pump) and pembrolizumab , he developed severe oral pain, mucositis, and oropharyngeal candidiasis. Symptoms include intense, persistent oral and throat pain, marked odynophagia, dysphagia, and a sensation of airway narrowing. Oral pain is refractory to lidocaine  mouthwash and morphine /fentanyl , with only partial, transient relief from Dilaudid ; pain rapidly recurs as the effect of Dilaudid  wears off.   He reports significant difficulty swallowing, requiring pauses during conversation, and describes mouth and throat soreness, erythema, and a sensation of the throat just closing up. Despite ongoing antifungal therapy, Magic Mouthwash, salt and baking soda rinses, and other home remedies, symptoms have not improved. He has been unable to eat adequately, resulting in weight loss to 168 pounds.  He developed severe mucositis and oral candidiasis after the first cycle, exceeding expected toxicity and suggesting possible increased sensitivity to 5FU. Hematologic parameters remain stable.  We will analyze for DYPD deficiency affecting 5FU metabolism, as this may account for heightened toxicity. Keytruda  is unlikely the primary cause of oral symptoms, though a minor contribution cannot be excluded. Dose reduction of 5FU is planned for subsequent cycles, and if enzyme deficiency is confirmed, a switch to an alternative chemotherapy agent will be considered while continuing immunotherapy. - We will arrange for Guardant 360 blood test post-discharge to assess for enzyme deficiency affecting 5FU metabolism. - Planned reduction of 5FU dose for the next chemotherapy cycle. - Plan to continue pembrolizumab  at standard fixed  dose.    ASSESSMENT & PLAN:  Squamous cell carcinoma of left  tonsil with liver mets -Initially diagnosed June 2025.  Liver mets diagnosed 07/03/2024 - Status post radiation therapy, completed 04/15/2024 -Started chemoimmunotherapy with Pembro + carbo + 5-FU every 21 days with plans for 6 cycles, cycle 1 initiated 08/18/2024 - Medical oncology/Dr. Jubal Hester following and will make further recommendations.   Oral candidiasis, secondary to antineoplastic chemotherapy Dysphagia - Patient developed severe mucositis after chemotherapy. - We will analyze for DY PD deficiency affecting 5-FU metabolism which may account for heightened toxicity.  Dose reduction of 5-FU is planned for subsequent cycles.  If enzyme deficiency is confirmed, may switch to alternative chemotherapy agent while continuing immunotherapy. - Will arrange for Guardant360 blood test postdischarge to assess for enzyme deficiency affecting 5-FU metabolism - Continue oral rinses, Decadron  and IV Diflucan  as ordered - Continue pain medication regimen as ordered - Continue IV hydration  Neutropenia/leukopenia - Secondary to recent oncologic therapy - WBC 1.1 with ANC 0.3 - Ordered G-CSF 300 mg daily x 4 doses. - Afebrile, monitor fever curve - Continue neutropenic precautions, explained rationale to patient. - Monitor CBC with differential  Anemia -Secondary to recent oncologic therapy - Hemoglobin 10.7 - No transfusional intervention required at this time - Continue to monitor CBC with differential      Code Status Full  Subjective:  Patient seen awake and alert laying supine in bed.  Reports ongoing oral and throat pain although states he feels it is slightly better.  Still with difficulty swallowing even liquids.  Feeling a little defeated over current situation and desires quality of life, states if this happens with next cycle he may consider stopping treatment.  Listened empathetically and advised to speak further with  Douglas Hester.  Objective:   Intake/Output Summary (Last 24 hours) at 08/29/2024 0956 Last data filed at 08/29/2024 9472 Gross per 24 hour  Intake 1766.79 ml  Output --  Net 1766.79 ml     PHYSICAL EXAMINATION: ECOG PERFORMANCE STATUS: 1 - Symptomatic but completely ambulatory  Vitals:   08/28/24 2021 08/29/24 0432  BP: 114/76 128/67  Pulse: 99 (!) 109  Resp: 18 18  Temp: 98.9 F (37.2 C) 98.5 F (36.9 C)  SpO2: 100% 99%   Filed Weights   08/26/24 1041  Weight: 165 lb (74.8 kg)    GENERAL: alert, no distress and comfortable SKIN: skin color, texture, turgor are normal, no rashes or significant lesions EYES: normal, conjunctiva are pink and non-injected, sclera clear OROPHARYNX: + White lesions over tip of tongue and inside oral cavity  NECK: supple, thyroid  normal size, non-tender, without nodularity LYMPH: no palpable lymphadenopathy in the cervical, axillary or inguinal LUNGS: clear to auscultation and percussion with normal breathing effort HEART: regular rate & rhythm and no murmurs and no lower extremity edema ABDOMEN: abdomen soft, non-tender and normal bowel sounds MUSCULOSKELETAL: no cyanosis of digits and no clubbing  PSYCH: alert & oriented x 3 with fluent speech NEURO: no focal motor/sensory deficits   All questions were answered. The patient knows to call the clinic with any problems, questions or concerns.   I personally spent a total of 40 minutes minutes in the care of the patient today including preparing to see the patient, performing a medically appropriate exam/evaluation, counseling and educating, referring and communicating with other health care professionals, documenting clinical information in the EHR, communicating results, and coordinating care.    Douglas PARAS Rouson, Douglas Hester 08/29/2024 9:56 AM    Labs Reviewed:  Lab Results  Component Value Date   WBC 1.1 (LL) 08/29/2024  HGB 10.7 (L) 08/29/2024   HCT 34.1 (L) 08/29/2024   MCV 87.9 08/29/2024    PLT 183 08/29/2024   Recent Labs    08/26/24 1101 08/27/24 0723 08/29/24 0733  NA 136 136 131*  K 3.6 4.4 3.8  CL 97* 101 94*  CO2 26 27 29   GLUCOSE 125* 133* 88  BUN 15 11 10   CREATININE 0.91 0.64 0.67  CALCIUM 9.5 9.7 8.9  GFRNONAA >60 >60 >60  PROT 7.2 6.7 6.3*  ALBUMIN 4.0 3.7 3.2*  AST 33 26 87*  ALT 65* 60* 122*  ALKPHOS 200* 193* 235*  BILITOT 0.8 0.7 0.5    Studies Reviewed:   IR IMAGING GUIDED PORT INSERTION Result Date: 08/15/2024 CLINICAL DATA:  Metastatic squamous carcinoma of the left tonsil and need for porta cath for chemotherapy EXAM: IMPLANTED PORT A CATH PLACEMENT WITH ULTRASOUND AND FLUOROSCOPIC GUIDANCE ANESTHESIA/SEDATION: Moderate (conscious) sedation was employed during this procedure. A total of Versed  4.0 mg and Fentanyl  100 mcg was administered intravenously. Moderate Sedation Time: 30 minutes. The patient's level of consciousness and vital signs were monitored continuously by radiology nursing throughout the procedure under my direct supervision. FLUOROSCOPY: Radiation Exposure Index: 1.0 mGy Kerma PROCEDURE: The procedure, risks, benefits, and alternatives were explained to the patient. Questions regarding the procedure were encouraged and answered. The patient understands and consents to the procedure. A time-out was performed prior to initiating the procedure. Ultrasound was utilized to confirm patency of the right internal jugular vein. An ultrasound image was saved and recorded. The right neck and chest were prepped with chlorhexidine  in a sterile fashion, and a sterile drape was applied covering the operative field. Maximum barrier sterile technique with sterile gowns and gloves were used for the procedure. Local anesthesia was provided with 1% lidocaine . After creating a small venotomy incision, a 21 gauge needle was advanced into the right internal jugular vein under direct, real-time ultrasound guidance. Ultrasound image documentation was performed.  After securing guidewire access, an 8 Fr dilator was placed. A J-wire was kinked to measure appropriate catheter length. A subcutaneous port pocket was then created along the upper chest wall utilizing sharp and blunt dissection. Portable cautery was utilized. The pocket was irrigated with sterile saline. A single lumen power injectable port was chosen for placement. The 8 Fr catheter was tunneled from the port pocket site to the venotomy incision. The port was placed in the pocket. External catheter was trimmed to appropriate length based on guidewire measurement. At the venotomy, an 8 Fr peel-away sheath was placed over a guidewire. The catheter was then placed through the sheath and the sheath removed. Final catheter positioning was confirmed and documented with a fluoroscopic spot image. The port was accessed with a needle and aspirated and flushed with heparinized saline. The access needle was removed. The venotomy and port pocket incisions were closed with subcutaneous 3-0 Monocryl and subcuticular 4-0 Vicryl. Dermabond was applied to both incisions. COMPLICATIONS: COMPLICATIONS None FINDINGS: After catheter placement, the tip lies at the cavo-atrial junction. The catheter aspirates normally and is ready for immediate use. IMPRESSION: Placement of single lumen port a cath via right internal jugular vein. The catheter tip lies at the cavo-atrial junction. A power injectable port a cath was placed and is ready for immediate use. Electronically Signed   By: Marcey Moan M.D.   On: 08/15/2024 17:20   US  BIOPSY (LIVER) Result Date: 08/05/2024 INDICATION: 63 year old with squamous cell carcinoma of the left tonsil. Patient has multiple suspicious liver  lesions. Tissue diagnosis is needed. EXAM: ULTRASOUND-GUIDED LIVER LESION BIOPSY MEDICATIONS: Moderate sedation ANESTHESIA/SEDATION: Moderate (conscious) sedation was employed during this procedure. A total of Versed  2 mg and Fentanyl  100 mcg was administered  intravenously by the radiology nurse. Total intra-service moderate Sedation Time: 26 minutes. The patient's level of consciousness and vital signs were monitored continuously by radiology nursing throughout the procedure under my direct supervision. FLUOROSCOPY TIME:  None COMPLICATIONS: None immediate. PROCEDURE: Informed written consent was obtained from the patient after a thorough discussion of the procedural risks, benefits and alternatives. All questions were addressed. A timeout was performed prior to the initiation of the procedure. Liver was evaluated ultrasound. A right hepatic lesion was identified and targeted. The right side of the abdomen was prepped with chlorhexidine  and sterile field was created. Skin was anesthetized using 1% lidocaine . Small incision was made. Using ultrasound guidance, 17 gauge coaxial needle was directed into the right hepatic lesion. Total of 3 core biopsies were performed with an 18 gauge core device. Three adequate specimens obtained and placed in formalin. Gel-Foam slurry was injected as the 17 gauge needle was removed. Bandage placed over the puncture site. FINDINGS: Subtle heterogeneous liver lesions. Partially necrotic or cystic lesion in the right hepatic lobe was targeted. Biopsy needle confirmed within the lesion. No immediate bleeding or hematoma formation. IMPRESSION: Successful ultrasound-guided core biopsies from a right hepatic lesion. Electronically Signed   By: Juliene Balder M.D.   On: 08/05/2024 12:46

## 2024-08-30 DIAGNOSIS — K1379 Other lesions of oral mucosa: Secondary | ICD-10-CM | POA: Diagnosis not present

## 2024-08-30 LAB — CBC WITH DIFFERENTIAL/PLATELET
Abs Immature Granulocytes: 0.07 10*3/uL (ref 0.00–0.07)
Basophils Absolute: 0 10*3/uL (ref 0.0–0.1)
Basophils Relative: 1 %
Eosinophils Absolute: 0.2 10*3/uL (ref 0.0–0.5)
Eosinophils Relative: 12 %
HCT: 34 % — ABNORMAL LOW (ref 39.0–52.0)
Hemoglobin: 11 g/dL — ABNORMAL LOW (ref 13.0–17.0)
Immature Granulocytes: 5 %
Lymphocytes Relative: 24 %
Lymphs Abs: 0.3 10*3/uL — ABNORMAL LOW (ref 0.7–4.0)
MCH: 28.1 pg (ref 26.0–34.0)
MCHC: 32.4 g/dL (ref 30.0–36.0)
MCV: 86.7 fL (ref 80.0–100.0)
Monocytes Absolute: 0.4 10*3/uL (ref 0.1–1.0)
Monocytes Relative: 29 %
Neutro Abs: 0.4 10*3/uL — CL (ref 1.7–7.7)
Neutrophils Relative %: 29 %
Platelets: 145 10*3/uL — ABNORMAL LOW (ref 150–400)
RBC: 3.92 MIL/uL — ABNORMAL LOW (ref 4.22–5.81)
RDW: 13.1 % (ref 11.5–15.5)
WBC: 1.3 10*3/uL — CL (ref 4.0–10.5)
nRBC: 0 % (ref 0.0–0.2)

## 2024-08-30 LAB — COMPREHENSIVE METABOLIC PANEL WITH GFR
ALT: 154 U/L — ABNORMAL HIGH (ref 0–44)
AST: 64 U/L — ABNORMAL HIGH (ref 15–41)
Albumin: 3.4 g/dL — ABNORMAL LOW (ref 3.5–5.0)
Alkaline Phosphatase: 245 U/L — ABNORMAL HIGH (ref 38–126)
Anion gap: 7 (ref 5–15)
BUN: 11 mg/dL (ref 8–23)
CO2: 32 mmol/L (ref 22–32)
Calcium: 9.6 mg/dL (ref 8.9–10.3)
Chloride: 95 mmol/L — ABNORMAL LOW (ref 98–111)
Creatinine, Ser: 0.71 mg/dL (ref 0.61–1.24)
GFR, Estimated: >60 mL/min
Glucose, Bld: 111 mg/dL — ABNORMAL HIGH (ref 70–99)
Potassium: 4 mmol/L (ref 3.5–5.1)
Sodium: 133 mmol/L — ABNORMAL LOW (ref 135–145)
Total Bilirubin: 0.7 mg/dL (ref 0.0–1.2)
Total Protein: 6.3 g/dL — ABNORMAL LOW (ref 6.5–8.1)

## 2024-08-30 NOTE — Progress Notes (Signed)
 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  Patient has been using oxycodone  and Magic mouthwash with lidocaine  without any improvement.  He has only been able to tolerate liquids and has started having trouble swallowing his medications.  He denies any abdominal pain nausea vomiting diarrhea. Significant labs include ALT 65, alkaline phosphatase 200, rest of the labs within normal limits.  Patient was admitted for further evaluation and started on adequate pain control.  Oncology is consulted.  Assessment & Plan:   Principal Problem:   Acute oral pain Active Problems:   Nausea   Cancer related pain   Essential hypertension   GERD (gastroesophageal reflux disease)   Bipolar I disorder, most recent episode depressed (HCC)   Squamous cell carcinoma of left tonsil (HCC)   Opioid-induced pruritus   RLS (restless legs syndrome)   Mucositis oral   Drug-induced constipation   Chemotherapy induced neutropenia  Acute oral pain: Mucositis and oral candidiasis: Patient has developed acute pain following first cycle of chemotherapy for stage IV tonsillar cancer.   He complains of severe oral pain and ulcerations, inability to tolerate oral intake.   Labs reassuring at time of admission. Continue maintenance IV fluids. Continue IV analgesics as needed. Continue Viscous lidocaine  every 4 hours as needed Patient initiated on dexamethasone  as per Dr. Autumn.  Continue IV fluconazole  for at least  few more days until his swallowing improves,  later can be switched to oral fluconazole  for total duration of 10 days.  Nausea: Suspect it could be side effect of chemotherapy.   Patient denies vomiting but has had intermittent nausea. Continue IV antiemetics as needed. Continue IV hydration.   Essential hypertension BP was 140/83 in the ED, mildly elevated in setting of pain. Continue IV hydralazine  PRN for now Toprol  XL switched to IV metoprolol  for now.   Cancer related pain: Continue oxycodone  10 mg Q4H PRN. Currently with acute oral pain as above. Titrate pain regimen. Continue Dilaudid  1 mg IV every 4 hours as needed.   Bipolar I disorder, most recent episode depressed [facility]) Psychiatry follow up of 07/17/2024 reviewed.  Continue  Lamictal  and Wellbutrin  The patient is unable to swallow pills now. --Per note, is no longer taking Paxil, trazodone , mirtazapine    GERD (gastroesophageal reflux disease): IV PPI for now.   Opioid-induced pruritus: IV benadryl  PRN or if tolerating PO meds, home hydroxyzine  PRN   Squamous cell carcinoma of left tonsil (HCC) Status post radiation treatment, during which time he was on tube feeds by G-tube.   Liver mets discovered on surveillance imaging in December. Recently started induction regimen with carboplatin , 5-FU, and pembrolizumab  (Keytruda ) on 08/18/2024.   Follows with Dr. Conchetta.  EDP discussed case with on-call oncologist. He recommended admission for pain control, IV fluids. Consider Palliative care consultation.  Neutropenia / Leukopenia :  Secondary to recent oncological therapy. WBC 1.1 with ANC 0.3 Patient started on Neupogen  4 doses. Monitor fever curve.  Neutropenic precautions.   DVT prophylaxis: Lovenox  Code Status: Full code Family  Communication: No family at bedside. Disposition Plan:  Status is: Inpatient Remains inpatient appropriate because: Admitted for acute oral pain, inability to tolerate liquids and  solids secondary to tonsillar carcinoma. Oncology is consulted.   Patient is not medically ready for discharge.  Consultants:  Oncology  Procedures:  Antimicrobials:  Anti-infectives (From admission, onward)    Start     Dose/Rate Route Frequency Ordered Stop   08/27/24 1300  fluconazole  (DIFLUCAN ) IVPB 400 mg        400 mg 100 mL/hr over 120 Minutes Intravenous Every 24 hours 08/27/24 1113 09/03/24 1259      Subjective: Patient was seen and examined at bedside. Overnight events noted. Patient reports pain is reasonably controlled with IV Dilaudid . He reports able to tolerated full liquid diet.  Objective: Vitals:   08/29/24 1434 08/29/24 2019 08/30/24 0030 08/30/24 0452  BP: 110/68 120/69 115/71 113/70  Pulse: (!) 106 (!) 115 (!) 109 (!) 110  Resp: 15 20 18 20   Temp: 99.1 F (37.3 C) 99.3 F (37.4 C) 98.8 F (37.1 C) 99.3 F (37.4 C)  TempSrc: Oral Oral Oral Oral  SpO2: 97% 98% 98% 98%  Weight:      Height:        Intake/Output Summary (Last 24 hours) at 08/30/2024 1330 Last data filed at 08/29/2024 1505 Gross per 24 hour  Intake 735.43 ml  Output --  Net 735.43 ml   Filed Weights   08/26/24 1041  Weight: 74.8 kg    Examination:  General exam: Appears calm and comfortable, not in any acute distress. Respiratory system: CTA Bilaterally. Respiratory effort normal.  RR 14 Cardiovascular system: S1 & S2 heard, RRR. No JVD, murmurs, rubs, gallops or clicks.  Gastrointestinal system: Abdomen is non distended, soft and non tender. Normal bowel sounds heard. Central nervous system: Alert and oriented x 3. No focal neurological deficits. Extremities: No edema, no cyanosis, no clubbing. Skin: No rashes, lesions or ulcers Psychiatry: Judgement and insight appear normal. Mood & affect appropriate.   Data Reviewed: I have personally reviewed following labs and imaging studies  CBC: Recent Labs  Lab 08/26/24 1101 08/27/24 0723 08/29/24 0733 08/30/24 0738  WBC  5.8 3.8* 1.1* 1.3*  NEUTROABS 5.1  --  0.3* 0.4*  HGB 12.0* 11.1* 10.7* 11.0*  HCT 37.1* 35.5* 34.1* 34.0*  MCV 86.1 88.5 87.9 86.7  PLT 269 256 183 145*   Basic Metabolic Panel: Recent Labs  Lab 08/26/24 1101 08/27/24 0723 08/29/24 0733 08/30/24 0738  NA 136 136 131* 133*  K 3.6 4.4 3.8 4.0  CL 97* 101 94* 95*  CO2 26 27 29  32  GLUCOSE 125* 133* 88 111*  BUN 15 11 10 11   CREATININE 0.91 0.64 0.67 0.71  CALCIUM 9.5 9.7 8.9 9.6   GFR: Estimated Creatinine Clearance: 94.5 mL/min (by C-G formula based on SCr of 0.71 mg/dL). Liver Function Tests: Recent Labs  Lab 08/26/24 1101 08/27/24 0723 08/29/24 0733 08/30/24 0738  AST 33 26 87* 64*  ALT 65* 60* 122* 154*  ALKPHOS 200* 193* 235* 245*  BILITOT 0.8 0.7 0.5 0.7  PROT 7.2 6.7 6.3* 6.3*  ALBUMIN 4.0 3.7 3.2* 3.4*   Recent Labs  Lab 08/26/24 1101  LIPASE 21   No results for input(s): AMMONIA in the last 168 hours. Coagulation Profile: No results for input(s): INR, PROTIME in the last 168 hours. Cardiac Enzymes: No results for input(s): CKTOTAL, CKMB, CKMBINDEX, TROPONINI in the last 168 hours. BNP (last 3 results) No  results for input(s): PROBNP in the last 8760 hours. HbA1C: No results for input(s): HGBA1C in the last 72 hours. CBG: No results for input(s): GLUCAP in the last 168 hours. Lipid Profile: No results for input(s): CHOL, HDL, LDLCALC, TRIG, CHOLHDL, LDLDIRECT in the last 72 hours. Thyroid  Function Tests: No results for input(s): TSH, T4TOTAL, FREET4, T3FREE, THYROIDAB in the last 72 hours. Anemia Panel: No results for input(s): VITAMINB12, FOLATE, FERRITIN, TIBC, IRON, RETICCTPCT in the last 72 hours. Sepsis Labs: No results for input(s): PROCALCITON, LATICACIDVEN in the last 168 hours.  No results found for this or any previous visit (from the past 240 hours).   Radiology Studies: DG CHEST PORT 1 VIEW Result Date: 08/29/2024 CLINICAL  DATA:  Cough. EXAM: PORTABLE CHEST 1 VIEW COMPARISON:  Radiograph 07/09/2022, PET CT 07/09/2024 FINDINGS: Right chest port in place. The cardiomediastinal contours are normal. The lungs are clear. Pulmonary vasculature is normal. No consolidation, pleural effusion, or pneumothorax. No acute osseous abnormalities are seen. IMPRESSION: No active disease. Electronically Signed   By: Andrea Gasman M.D.   On: 08/29/2024 15:58    Scheduled Meds:  buPROPion   150 mg Oral Daily   enoxaparin  (LOVENOX ) injection  40 mg Subcutaneous Q24H   fiber  1 packet Oral BID   filgrastim  (NIVESTYM ) SQ  300 mcg Subcutaneous q1800   lamoTRIgine   150 mg Oral Daily   metoprolol  tartrate  5 mg Intravenous Q8H   pantoprazole  (PROTONIX ) IV  40 mg Intravenous Q12H   rOPINIRole   0.5 mg Oral QHS   Continuous Infusions:  fluconazole  (DIFLUCAN ) IV 400 mg (08/30/24 1250)     LOS: 4 days    Time spent: 35 mins    Darcel Dawley, MD [facility]   If 7PM-7AM, please contact night-coverage  "

## 2024-08-30 NOTE — Plan of Care (Signed)
  Problem: Coping: Goal: Level of anxiety will decrease Outcome: Not Progressing   Problem: Pain Managment: Goal: General experience of comfort will improve and/or be controlled Outcome: Not Progressing   Problem: Education: Goal: Knowledge of General Education information will improve Description: Including pain rating scale, medication(s)/side effects and non-pharmacologic comfort measures Outcome: Progressing   Problem: Health Behavior/Discharge Planning: Goal: Ability to manage health-related needs will improve Outcome: Progressing   Problem: Clinical Measurements: Goal: Ability to maintain clinical measurements within normal limits will improve Outcome: Progressing Goal: Will remain free from infection Outcome: Progressing Goal: Diagnostic test results will improve Outcome: Progressing Goal: Respiratory complications will improve Outcome: Progressing Goal: Cardiovascular complication will be avoided Outcome: Progressing   Problem: Activity: Goal: Risk for activity intolerance will decrease Outcome: Progressing   Problem: Nutrition: Goal: Adequate nutrition will be maintained Outcome: Progressing   Problem: Elimination: Goal: Will not experience complications related to bowel motility Outcome: Progressing Goal: Will not experience complications related to urinary retention Outcome: Progressing   Problem: Safety: Goal: Ability to remain free from injury will improve Outcome: Progressing   Problem: Skin Integrity: Goal: Risk for impaired skin integrity will decrease Outcome: Progressing

## 2024-08-30 NOTE — Plan of Care (Signed)

## 2024-08-30 NOTE — Progress Notes (Signed)
 Critical labs WBC 1.3 and absolute neutrophil .4 received.   08/30/24 9188  Provider Notification  Provider Name/Title Darcel Dawley, MD  Date Provider Notified 08/30/24  Time Provider Notified [phone]  Method of Notification Page  Notification Reason Critical Result  Test performed and critical result WBC 1.3 and Absolute Neutrophil 0.4  Date Critical Result Received 08/30/24  Time Critical Result Received 0811  Provider response No new orders  Date of Provider Response 08/30/24  Time of Provider Response 0813  Rapid Response Notification  Name of Rapid Response RN Notified w

## 2024-08-31 DIAGNOSIS — K1379 Other lesions of oral mucosa: Secondary | ICD-10-CM | POA: Diagnosis not present

## 2024-08-31 LAB — COMPREHENSIVE METABOLIC PANEL WITH GFR
ALT: 145 U/L — ABNORMAL HIGH (ref 0–44)
AST: 49 U/L — ABNORMAL HIGH (ref 15–41)
Albumin: 3.4 g/dL — ABNORMAL LOW (ref 3.5–5.0)
Alkaline Phosphatase: 252 U/L — ABNORMAL HIGH (ref 38–126)
Anion gap: 5 (ref 5–15)
BUN: 11 mg/dL (ref 8–23)
CO2: 33 mmol/L — ABNORMAL HIGH (ref 22–32)
Calcium: 9.7 mg/dL (ref 8.9–10.3)
Chloride: 98 mmol/L (ref 98–111)
Creatinine, Ser: 0.73 mg/dL (ref 0.61–1.24)
GFR, Estimated: >60 mL/min
Glucose, Bld: 142 mg/dL — ABNORMAL HIGH (ref 70–99)
Potassium: 4.4 mmol/L (ref 3.5–5.1)
Sodium: 136 mmol/L (ref 135–145)
Total Bilirubin: 0.8 mg/dL (ref 0.0–1.2)
Total Protein: 6.6 g/dL (ref 6.5–8.1)

## 2024-08-31 LAB — CBC WITH DIFFERENTIAL/PLATELET
Abs Immature Granulocytes: 0.42 10*3/uL — ABNORMAL HIGH (ref 0.00–0.07)
Basophils Absolute: 0 10*3/uL (ref 0.0–0.1)
Basophils Relative: 1 %
Eosinophils Absolute: 0.2 10*3/uL (ref 0.0–0.5)
Eosinophils Relative: 8 %
HCT: 35.8 % — ABNORMAL LOW (ref 39.0–52.0)
Hemoglobin: 11.5 g/dL — ABNORMAL LOW (ref 13.0–17.0)
Immature Granulocytes: 19 %
Lymphocytes Relative: 16 %
Lymphs Abs: 0.4 10*3/uL — ABNORMAL LOW (ref 0.7–4.0)
MCH: 28.2 pg (ref 26.0–34.0)
MCHC: 32.1 g/dL (ref 30.0–36.0)
MCV: 87.7 fL (ref 80.0–100.0)
Monocytes Absolute: 0.4 10*3/uL (ref 0.1–1.0)
Monocytes Relative: 16 %
Neutro Abs: 0.9 10*3/uL — ABNORMAL LOW (ref 1.7–7.7)
Neutrophils Relative %: 40 %
Platelets: 105 10*3/uL — ABNORMAL LOW (ref 150–400)
RBC: 4.08 MIL/uL — ABNORMAL LOW (ref 4.22–5.81)
RDW: 13.5 % (ref 11.5–15.5)
WBC: 2.3 10*3/uL — ABNORMAL LOW (ref 4.0–10.5)
nRBC: 0 % (ref 0.0–0.2)

## 2024-08-31 MED ORDER — SODIUM CHLORIDE 0.9 % IV SOLN: INTRAVENOUS | Status: AC

## 2024-08-31 MED ORDER — OXYCODONE HCL 5 MG/5ML PO SOLN
10.0000 mg | ORAL | Status: DC | PRN
  Administered 2024-08-31 – 2024-09-02 (×10): 10 mg via ORAL
  Filled 2024-08-31 (×10): qty 10

## 2024-08-31 NOTE — Progress Notes (Signed)
 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  Patient has been using oxycodone  and Magic mouthwash with lidocaine  without any improvement.  He has only been able to tolerate liquids and has started having trouble swallowing his medications.  He denies any abdominal pain nausea vomiting diarrhea. Significant labs include ALT 65, alkaline phosphatase 200, rest of the labs within normal limits.  Patient was admitted for further evaluation and started on adequate pain control.  Oncology is consulted.  Assessment & Plan:   Principal Problem:   Acute oral pain Active Problems:   Nausea   Cancer related pain   Essential hypertension   GERD (gastroesophageal reflux disease)   Bipolar I disorder, most recent episode depressed (HCC)   Squamous cell carcinoma of left tonsil (HCC)   Opioid-induced pruritus   RLS (restless legs syndrome)   Mucositis oral   Drug-induced constipation   Chemotherapy induced neutropenia  Acute oral pain: Mucositis and oral candidiasis: Patient has developed acute pain following first cycle of chemotherapy for stage IV tonsillar cancer.   He complains of severe oral pain and ulcerations, inability to tolerate oral intake.   Labs reassuring at time of admission. Continue maintenance IV fluids. Continue oral oxycodone  solution for better pain control. Continue Viscous lidocaine  every 4 hours as needed Patient initiated on dexamethasone  as per Dr. Autumn.  Continue IV  fluconazole  for at least few more days until his swallowing improves,  later can be switched to oral fluconazole  for total duration of 10 days.  Nausea: Suspect it could be side effect of chemotherapy.   Patient denies vomiting but has had intermittent nausea. Continue IV antiemetics as needed. Continue IV hydration.   Essential hypertension BP was 140/83 in the ED, mildly elevated in setting of pain. Continue IV hydralazine  PRN for now Toprol  XL switched to IV metoprolol  for now.   Cancer related pain: Continue oxycodone  10 mg Q3H PRN. Currently with acute oral pain as above. Titrate pain regimen.    Bipolar I disorder, most recent episode depressed [facility]) Psychiatry follow up of 07/17/2024 reviewed.  Continue  Lamictal  and Wellbutrin  The patient is unable to swallow pills now. --Per note, is no longer taking Paxil, trazodone , mirtazapine .   GERD (gastroesophageal reflux disease): IV PPI for now.   Opioid-induced pruritus: IV benadryl  PRN or if tolerating PO meds, home hydroxyzine  PRN   Squamous cell carcinoma of left tonsil (HCC) Status post radiation treatment, during which time he was on tube feeds by G-tube.   Liver mets discovered on surveillance imaging in December. Recently started induction regimen with carboplatin , 5-FU, and pembrolizumab  (Keytruda ) on 08/18/2024.   Follows with Dr. Conchetta.  EDP discussed case with on-call oncologist. He recommended admission for pain control, IV fluids. Consider Palliative care consultation.  Neutropenia / Leukopenia :  Secondary to recent oncological therapy. WBC 1.1 with ANC 0.3 > Improving Patient started on Neupogen  4 doses. Monitor fever curve.  Neutropenic precautions.   DVT prophylaxis: Lovenox  Code Status: Full code Family Communication: No family at  bedside. Disposition Plan:  Status is: Inpatient Remains inpatient appropriate because: Admitted for acute oral pain, inability to tolerate liquids and solids secondary  to tonsillar carcinoma. Oncology is consulted.   Patient is not medically ready for discharge.  Consultants:  Oncology  Procedures:  Antimicrobials:  Anti-infectives (From admission, onward)    Start     Dose/Rate Route Frequency Ordered Stop   08/27/24 1300  fluconazole  (DIFLUCAN ) IVPB 400 mg        400 mg 100 mL/hr over 120 Minutes Intravenous Every 24 hours 08/27/24 1113 09/03/24 1259      Subjective: Patient was seen and examined at bedside. Overnight events noted. Patient reports pain is reasonably controlled with oxycodone  oral solution. He reports able to tolerated full liquid diet.  Objective: Vitals:   08/30/24 2121 08/30/24 2326 08/31/24 0100 08/31/24 0559  BP: (!) 114/56   102/65  Pulse: (!) 109   93  Resp: 16   16  Temp: 99.5 F (37.5 C) 99.5 F (37.5 C) 98.9 F (37.2 C) 97.7 F (36.5 C)  TempSrc: Oral   Oral  SpO2: 99%   99%  Weight:      Height:       No intake or output data in the 24 hours ending 08/31/24 1205  Filed Weights   08/26/24 1041  Weight: 74.8 kg    Examination:  General exam: Appears calm and comfortable, not in any acute distress. Respiratory system: CTA Bilaterally. Respiratory effort normal.  RR 14 Cardiovascular system: S1 & S2 heard, RRR. No JVD, murmurs, rubs, gallops or clicks.  Gastrointestinal system: Abdomen is non distended, soft and non tender. Normal bowel sounds heard. Central nervous system: Alert and oriented x 3. No focal neurological deficits. Extremities: No edema, no cyanosis, no clubbing. Skin: No rashes, lesions or ulcers Psychiatry: Judgement and insight appear normal. Mood & affect appropriate.   Data Reviewed: I have personally reviewed following labs and imaging studies  CBC: Recent Labs  Lab 08/26/24 1101 08/27/24 0723 08/29/24 0733 08/30/24 0738 08/31/24 0832  WBC 5.8 3.8* 1.1* 1.3* 2.3*  NEUTROABS 5.1  --  0.3* 0.4* 0.9*  HGB 12.0* 11.1* 10.7* 11.0* 11.5*  HCT 37.1* 35.5* 34.1* 34.0* 35.8*   MCV 86.1 88.5 87.9 86.7 87.7  PLT 269 256 183 145* 105*   Basic Metabolic Panel: Recent Labs  Lab 08/26/24 1101 08/27/24 0723 08/29/24 0733 08/30/24 0738 08/31/24 0832  NA 136 136 131* 133* 136  K 3.6 4.4 3.8 4.0 4.4  CL 97* 101 94* 95* 98  CO2 26 27 29  32 33*  GLUCOSE 125* 133* 88 111* 142*  BUN 15 11 10 11 11   CREATININE 0.91 0.64 0.67 0.71 0.73  CALCIUM 9.5 9.7 8.9 9.6 9.7   GFR: Estimated Creatinine Clearance: 94.5 mL/min (by C-G formula based on SCr of 0.73 mg/dL). Liver Function Tests: Recent Labs  Lab 08/26/24 1101 08/27/24 0723 08/29/24 0733 08/30/24 0738 08/31/24 0832  AST 33 26 87* 64* 49*  ALT 65* 60* 122* 154* 145*  ALKPHOS 200* 193* 235* 245* 252*  BILITOT 0.8 0.7 0.5 0.7 0.8  PROT 7.2 6.7 6.3* 6.3* 6.6  ALBUMIN 4.0 3.7 3.2* 3.4* 3.4*   Recent Labs  Lab 08/26/24 1101  LIPASE 21   No results for input(s): AMMONIA in the last 168 hours. Coagulation Profile: No results for input(s): INR, PROTIME in the last 168 hours. Cardiac Enzymes: No results for input(s): CKTOTAL, CKMB, CKMBINDEX, TROPONINI in the last 168 hours. BNP (last 3  results) No results for input(s): PROBNP in the last 8760 hours. HbA1C: No results for input(s): HGBA1C in the last 72 hours. CBG: No results for input(s): GLUCAP in the last 168 hours. Lipid Profile: No results for input(s): CHOL, HDL, LDLCALC, TRIG, CHOLHDL, LDLDIRECT in the last 72 hours. Thyroid  Function Tests: No results for input(s): TSH, T4TOTAL, FREET4, T3FREE, THYROIDAB in the last 72 hours. Anemia Panel: No results for input(s): VITAMINB12, FOLATE, FERRITIN, TIBC, IRON, RETICCTPCT in the last 72 hours. Sepsis Labs: No results for input(s): PROCALCITON, LATICACIDVEN in the last 168 hours.  No results found for this or any previous visit (from the past 240 hours).   Radiology Studies: No results found.   Scheduled Meds:  buPROPion   150 mg Oral  Daily   enoxaparin  (LOVENOX ) injection  40 mg Subcutaneous Q24H   fiber  1 packet Oral BID   filgrastim  (NIVESTYM ) SQ  300 mcg Subcutaneous q1800   lamoTRIgine   150 mg Oral Daily   metoprolol  tartrate  5 mg Intravenous Q8H   pantoprazole  (PROTONIX ) IV  40 mg Intravenous Q12H   rOPINIRole   0.5 mg Oral QHS   Continuous Infusions:  sodium chloride  100 mL/hr at 08/31/24 1114   fluconazole  (DIFLUCAN ) IV 400 mg (08/31/24 1116)     LOS: 5 days    Time spent: 35 mins    Darcel Dawley, MD [facility]   If 7PM-7AM, please contact night-coverage  "

## 2024-08-31 NOTE — Plan of Care (Signed)
   Problem: Education: Goal: Knowledge of General Education information will improve Description: Including pain rating scale, medication(s)/side effects and non-pharmacologic comfort measures Outcome: Not Progressing   Problem: Health Behavior/Discharge Planning: Goal: Ability to manage health-related needs will improve Outcome: Not Progressing   Problem: Clinical Measurements: Goal: Ability to maintain clinical measurements within normal limits will improve Outcome: Not Progressing Goal: Will remain free from infection Outcome: Not Progressing Goal: Diagnostic test results will improve Outcome: Not Progressing Goal: Respiratory complications will improve Outcome: Not Progressing

## 2024-09-01 DIAGNOSIS — K1379 Other lesions of oral mucosa: Secondary | ICD-10-CM | POA: Diagnosis not present

## 2024-09-01 LAB — CBC WITH DIFFERENTIAL/PLATELET
Abs Immature Granulocytes: 0.09 10*3/uL — ABNORMAL HIGH (ref 0.00–0.07)
Basophils Absolute: 0.1 10*3/uL (ref 0.0–0.1)
Basophils Relative: 1 %
Eosinophils Absolute: 0.2 10*3/uL (ref 0.0–0.5)
Eosinophils Relative: 4 %
HCT: 33.2 % — ABNORMAL LOW (ref 39.0–52.0)
Hemoglobin: 10.7 g/dL — ABNORMAL LOW (ref 13.0–17.0)
Immature Granulocytes: 2 %
Lymphocytes Relative: 10 %
Lymphs Abs: 0.5 10*3/uL — ABNORMAL LOW (ref 0.7–4.0)
MCH: 28.3 pg (ref 26.0–34.0)
MCHC: 32.2 g/dL (ref 30.0–36.0)
MCV: 87.8 fL (ref 80.0–100.0)
Monocytes Absolute: 0.9 10*3/uL (ref 0.1–1.0)
Monocytes Relative: 19 %
Neutro Abs: 3 10*3/uL (ref 1.7–7.7)
Neutrophils Relative %: 64 %
Platelets: 114 10*3/uL — ABNORMAL LOW (ref 150–400)
RBC: 3.78 MIL/uL — ABNORMAL LOW (ref 4.22–5.81)
RDW: 13.9 % (ref 11.5–15.5)
WBC: 4.7 10*3/uL (ref 4.0–10.5)
nRBC: 0.4 % — ABNORMAL HIGH (ref 0.0–0.2)

## 2024-09-01 MED ORDER — FLUCONAZOLE 100 MG PO TABS
400.0000 mg | ORAL_TABLET | Freq: Every day | ORAL | Status: DC
  Administered 2024-09-01: 400 mg via ORAL
  Filled 2024-09-01: qty 4

## 2024-09-01 NOTE — Plan of Care (Signed)

## 2024-09-01 NOTE — Plan of Care (Signed)
   Problem: Education: Goal: Knowledge of General Education information will improve Description: Including pain rating scale, medication(s)/side effects and non-pharmacologic comfort measures Outcome: Not Progressing   Problem: Health Behavior/Discharge Planning: Goal: Ability to manage health-related needs will improve Outcome: Not Progressing   Problem: Clinical Measurements: Goal: Ability to maintain clinical measurements within normal limits will improve Outcome: Not Progressing Goal: Will remain free from infection Outcome: Not Progressing Goal: Diagnostic test results will improve Outcome: Not Progressing Goal: Respiratory complications will improve Outcome: Not Progressing

## 2024-09-02 ENCOUNTER — Ambulatory Visit (INDEPENDENT_AMBULATORY_CARE_PROVIDER_SITE_OTHER): Admitting: Otolaryngology

## 2024-09-02 ENCOUNTER — Other Ambulatory Visit (HOSPITAL_COMMUNITY): Payer: Self-pay

## 2024-09-02 DIAGNOSIS — K1379 Other lesions of oral mucosa: Secondary | ICD-10-CM | POA: Diagnosis not present

## 2024-09-02 LAB — BASIC METABOLIC PANEL WITH GFR
Anion gap: 7 (ref 5–15)
BUN: 6 mg/dL — ABNORMAL LOW (ref 8–23)
CO2: 31 mmol/L (ref 22–32)
Calcium: 9.3 mg/dL (ref 8.9–10.3)
Chloride: 99 mmol/L (ref 98–111)
Creatinine, Ser: 0.69 mg/dL (ref 0.61–1.24)
GFR, Estimated: >60 mL/min
Glucose, Bld: 101 mg/dL — ABNORMAL HIGH (ref 70–99)
Potassium: 4 mmol/L (ref 3.5–5.1)
Sodium: 137 mmol/L (ref 135–145)

## 2024-09-02 LAB — MAGNESIUM: Magnesium: 1.8 mg/dL (ref 1.7–2.4)

## 2024-09-02 LAB — PHOSPHORUS: Phosphorus: 3 mg/dL (ref 2.5–4.6)

## 2024-09-02 MED ORDER — OXYCODONE HCL 5 MG/5ML PO SOLN
10.0000 mg | ORAL | 0 refills | Status: AC | PRN
  Filled 2024-09-02: qty 120, 2d supply, fill #0

## 2024-09-02 MED ORDER — FLUCONAZOLE 200 MG PO TABS
400.0000 mg | ORAL_TABLET | Freq: Every day | ORAL | 0 refills | Status: AC
  Filled 2024-09-02: qty 10, 5d supply, fill #0

## 2024-09-02 NOTE — Plan of Care (Signed)

## 2024-09-02 NOTE — Discharge Instructions (Signed)
 Advised to follow-up with oncology Dr. Chinita Pasam in 1 week. Advised to take fluconazole  400 mg daily for 5 more days to complete 10-day treatment. Advised to take the oxycodone  liquid solution as needed for pain control.

## 2024-09-02 NOTE — Progress Notes (Signed)
 Discharge medications delivered to patient at the bedside in a secure bag.

## 2024-09-03 ENCOUNTER — Telehealth (INDEPENDENT_AMBULATORY_CARE_PROVIDER_SITE_OTHER): Payer: Self-pay | Admitting: Otolaryngology

## 2024-09-03 ENCOUNTER — Encounter: Payer: Self-pay | Admitting: Oncology

## 2024-09-03 ENCOUNTER — Encounter: Payer: Self-pay | Admitting: Radiation Oncology

## 2024-09-03 ENCOUNTER — Other Ambulatory Visit (HOSPITAL_COMMUNITY): Payer: Self-pay

## 2024-09-03 NOTE — Telephone Encounter (Signed)
 The patient's sister called in, the patient was released from the hospital yesterday after chemo treatments.  He is currently unable to speak, he has several sores in his mouth and his tongue is swollen.  She states that the medications he was given while in the hospital are not helping his mouth.  I scheduled a 3 pm tomorrow follow up, but she also wants to see what can be done for him.  Please advise.

## 2024-09-04 ENCOUNTER — Telehealth: Payer: Self-pay

## 2024-09-04 ENCOUNTER — Other Ambulatory Visit (HOSPITAL_COMMUNITY): Payer: Self-pay

## 2024-09-04 ENCOUNTER — Encounter (INDEPENDENT_AMBULATORY_CARE_PROVIDER_SITE_OTHER): Payer: Self-pay | Admitting: Otolaryngology

## 2024-09-04 ENCOUNTER — Ambulatory Visit (INDEPENDENT_AMBULATORY_CARE_PROVIDER_SITE_OTHER): Admitting: Otolaryngology

## 2024-09-04 VITALS — BP 102/64 | HR 90 | Ht 69.0 in | Wt 165.0 lb

## 2024-09-04 DIAGNOSIS — C109 Malignant neoplasm of oropharynx, unspecified: Secondary | ICD-10-CM

## 2024-09-04 DIAGNOSIS — R1310 Dysphagia, unspecified: Secondary | ICD-10-CM

## 2024-09-04 DIAGNOSIS — K1230 Oral mucositis (ulcerative), unspecified: Secondary | ICD-10-CM

## 2024-09-04 MED ORDER — NYSTATIN 100000 UNIT/ML MT SUSP
8.0000 mL | Freq: Three times a day (TID) | OROMUCOSAL | 0 refills | Status: AC
  Filled 2024-09-04 (×2): qty 200, 9d supply, fill #0

## 2024-09-04 NOTE — Progress Notes (Signed)
 Dear Dr. Katina, Here is my assessment for our mutual patient, Douglas Hester. Thank you for allowing me the opportunity to care for your patient. Please do not hesitate to contact me should you have any other questions. Sincerely, Dr. Eldora Blanch  Otolaryngology Clinic Note Referring provider: Dr. Katina HPI:  Douglas Hester is a 64 y.o. male kindly referred by Dr. Katina for evaluation of left neck mass.  Initial visit (12/2023): Patient reports: he had a URI on Mother's day weekend and got antibiotics which helped some. But about 2 weeks later, he noted a growth on his left neck - some tenderness, no fevers. Did not resolve so went to ED where CT was done. Associated symptoms include some headache, Bilateral ear discomfort (left some worse than right), and subjective fever, malaise and some night sweats (chest mostly, but not soaking wet). No sick contacts. Patient otherwise denies: - dysphagia, odynophagia, unintentional weight loss - changes in voice, shortness of breath, hemoptysis  - Denies tobacco use or alcohol  use (used to, 4-5 beers on weekend but quit for past 4 years)  --------------------------------------------------------- 01/17/2024 Seen in follow up. 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 He reports neck pain from the biopsy, which is persistent. Swelling improving. No fevers. We had a long discussion regarding his diagnosis and next steps. Interestingly, no lymphoid tissue present in biopsy.  --------------------------------------------------------- 02/07/2024 Returns for follow up. He is doing well overall, with expected post op pain which hsa resolved. No issues with the neck. We did discuss his biopsy in depth. I also chatted with Dr. Izell prior about his plan. --------------------------------------------------------- 06/23/2024 Returns for follow up. He reports that he felt like his throat was starting to somewhat starting to  heal, and then became sore. More sensitive now. No fevers, does not feel like he is having an infection. More sensitive when dry. When throat feels moist, much better. Swallowing fairly well, eating most things. Weight is large stable now. Doing swallowing exercise.  Patient otherwise denies: - aspiration episodes or PNA, need for Heimlich, unintentional weight loss - changes in voice, shortness of breath, hemoptysis  - ear pain  --------------------------------------------------------- 09/04/2024 Seen in follow up. Unfortunately Mr. 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 He was recently admitted for oral pain and mucositis for which he is seeing me for. Lost taste, mouth is burning all the time. On fluconazole  and magic mouthwash. Mouthwash helps some but not enough. Did get dex in hospital which helped.    H&N Surgery: Tonsillectomy (as a child), DL/Bx Personal or FHx of bleeding dz or anesthesia difficulty: no  GLP-1: no AP/AC: no  Tobacco: no  PMHx: HTN, Insomnia, Anxiety  Independent Review of Additional Tests or Records:  CT Neck 01/03/2024 independently interpreted: large left jugulogastric node, internal cystic component; no significant stranding; left carotid medialized and this does appear to cause some asymmetry left OP and lingual tonsil prominence; noted slight left GT sulcus and hypopharynx asymmetry; no other concerning nodes noted. PET/CT 01/25/2024: left level 2 adenopathy with hyperavidity; noted relatively symmetric uptake over tonsillar fossa and lingual tonsils b/l; no other significant areas of uptake identified. 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 Has PEG tube. Seeing SLP; f/u Dec 2025 with PET prior Lupita Connor (05/30/2024): noted ageusia, little apetite, no sx of dysphagia; Not done  HEP; no c/f  aspiration; rec therapy CBC and CMP 05/17/2024: WBC 5.9, BUN/Cr 15/0.97 PET/CT 06/2024 reviewed and interpreted, agree with read Dr. Autumn notes and inpatient notes reviewed PMH/Meds/All/SocHx/FamHx/ROS:   Past Medical History:  Diagnosis Date   Anxiety    Cancer (HCC)    squamus   Chronic GERD    Heart murmur    Hypertension    Tachycardia      Past Surgical History:  Procedure Laterality Date   CHOLECYSTECTOMY N/A 10/11/2018   Procedure: LAPAROSCOPIC CHOLECYSTECTOMY WITH INTRAOPERATIVE CHOLANGIOGRAM;  Surgeon: Curvin Deward MOULD, MD;  Location: [facility];  Service: General;  Laterality: N/A;   COLONOSCOPY  ~ 2014   done by GI in [place]. Polyps were removed. Patient not aware what type of polyps these were   DIRECT LARYNGOSCOPY N/A 01/28/2024   Procedure: LARYNGOSCOPY, DIRECT WITH BIOPSIES;  Surgeon: Tobie Eldora NOVAK, MD;  Location: [facility] OR;  Service: ENT;  Laterality: N/A;   ESOPHAGOGASTRODUODENOSCOPY  early 1990s   MD found ulcer   INGUINAL HERNIA REPAIR Left ~ 2010   with Mesh.    IR GASTROSTOMY TUBE MOD SED  03/13/2024   IR GASTROSTOMY TUBE MOD SED  04/03/2024   IR GASTROSTOMY TUBE REMOVAL  04/02/2024   IR GASTROSTOMY TUBE REMOVAL  05/28/2024   IR IMAGING GUIDED PORT INSERTION  08/15/2024   VASECTOMY      Family History  Problem Relation Age of Onset   Lung cancer Mother    Lymphoma Father    Valvular heart disease Sister      Social Connections: Not on file      Current Outpatient Medications:    acetaminophen  (TYLENOL ) 500 MG tablet, Take 500-1,000 mg by mouth 2 (two) times daily as needed (pain.)., Disp: , Rfl:    buPROPion  (WELLBUTRIN  XL) 150 MG 24 hr tablet, Take 1 tablet (150 mg total) by mouth daily., Disp: 90 tablet, Rfl: 0   dexamethasone  (DECADRON ) 4 MG tablet, Take 2 tablets (8mg ) by mouth daily starting the day after carboplatin  for 3 days. Take with food, Disp: 30 tablet, Rfl: 1   dexamethasone  0.5 mg/5 mL - nystatin  100,000 units/mL oral suspension 1:1  mixture, Take 8 mLs by mouth 3 (three) times daily for 10 days. Swish and spit, Disp: 300 mL, Rfl: 0   fluconazole  (DIFLUCAN ) 200 MG tablet, Take 2 tablets (400 mg total) by mouth daily at 12 noon for 5 days., Disp: 10 tablet, Rfl: 0   hydrOXYzine  (ATARAX ) 10 MG tablet, Take 1 tablet (10 mg total) by mouth 3 (three) times daily as needed for itching., Disp: 30 tablet, Rfl: 3   lamoTRIgine  (LAMICTAL ) 150 MG tablet, Take 1 tablet (150 mg total) by mouth daily., Disp: 30 tablet, Rfl: 2   lidocaine -prilocaine  (EMLA ) cream, Apply to affected area once, Disp: 30 g, Rfl: 3   magic mouthwash (nystatin , lidocaine , diphenhydrAMINE , alum & mag hydroxide) suspension, Swish and swallow 5 mLs by mouth 4 (four) times daily as needed for mouth pain. Suspension contains equal amounts of Maalox Extra Strength, nystatin , diphenhydramine  and lidocaine ., Disp: 150 mL, Rfl: 1   Melatonin 5 MG TABS, Take 5 mg by mouth at bedtime as needed (sleep)., Disp: , Rfl:    metoprolol  succinate (TOPROL -XL) 50 MG 24 hr tablet, Take 50 mg by mouth daily., Disp: , Rfl:    omeprazole  (PRILOSEC) 40 MG capsule, Take 1 capsule (40 mg total) by mouth daily. (Patient taking differently: Take 40 mg by mouth at bedtime.), Disp: 30 capsule,  Rfl: 3   ondansetron  (ZOFRAN ) 8 MG tablet, Take 1 tablet (8 mg total) by mouth every 8 (eight) hours as needed for nausea or vomiting. Start on the third day after carboplatin ., Disp: 30 tablet, Rfl: 1   oxyCODONE  (ROXICODONE ) 5 MG/5ML solution, Take 10 mLs (10 mg total) by mouth every 3 (three) hours as needed for moderate pain (pain score 4-6) or breakthrough pain., Disp: 120 mL, Rfl: 0   polyethylene glycol (MIRALAX  / GLYCOLAX ) 17 g packet, Take 17 g by mouth 2 (two) times daily., Disp: 14 each, Rfl: 0   prochlorperazine  (COMPAZINE ) 10 MG tablet, Take 1 tablet (10 mg total) by mouth every 6 (six) hours as needed for nausea or vomiting., Disp: 30 tablet, Rfl: 1   rOPINIRole  (REQUIP ) 0.5 MG tablet, SMARTSIG:1  Tablet(s) By Mouth Every Evening, Disp: , Rfl:    Physical Exam:   BP 102/64 (BP Location: Left Arm, Patient Position: Sitting, Cuff Size: Large)   Pulse 90   Ht 5' 9 (1.753 m)   Wt 165 lb (74.8 kg)   SpO2 (!) 60%   BMI 24.37 kg/m   Salient findings:  CN II-XII intact Bilateral EAC clear and TM intact with well pneumatized middle ear spaces No lesions of oral cavity/oropharynx; dentition fair; strong gag; some diffuse mucositis changes and oral cavity modestly dry; no evidence of thrush No obviously palpable neck masses/lymphadenopathy/thyromegaly -- cannot feel neck mass No respiratory distress or stridor; some throat discomfort so TFL was indicated to better evaluate the proximal airway, given the patient's history and exam findings, and is detailed below.  Seprately Identifiable Procedures:  Prior to initiating any procedures, risks/benefits/alternatives were explained to the patient and verbal consent obtained.  Procedure Note Pre-procedure diagnosis:  Carcinoma of tongue base and lingual tonsil, odynophagia, throat pain Post-procedure diagnosis: Same Procedure: Transnasal Fiberoptic Laryngoscopy, CPT 31575 - Mod 25 Indication: see above Complications: None apparent EBL: 0 mL  The procedure was undertaken to further evaluate the patient's complaint above, with mirror exam inadequate for appropriate examination due to gag reflex and poor patient tolerance  Procedure:  Patient was identified as correct patient. Verbal consent was obtained. The nose was sprayed with oxymetazoline  and 4% lidocaine . The The flexible laryngoscope was passed through the nose to view the nasal cavity, pharynx (oropharynx, hypopharynx) and larynx.  The larynx was examined at rest and during multiple phonatory tasks. Documentation was obtained and reviewed with patient. The scope was removed. The patient tolerated the procedure well.  Findings: The nasal cavity and nasopharynx did not reveal any masses  or lesions, mucosa appeared to be without obvious lesions. The tongue base, pharyngeal walls, piriform sinuses, vallecula, epiglottis and postcricoid region are normal in appearance EXCEPT --- expected fullness and post-radiation change with trace edema lingual tonsils; no evidence of thrush; The visualized portion of the subglottis and proximal trachea is widely patent. The vocal folds are mobile bilaterally. There are no lesions on the free edge of the vocal folds nor elsewhere in the larynx worrisome for malignancy.     Electronically signed by: Eldora KATHEE Blanch, MD 09/04/2024 3:27 PM   Impression & Plans:  Kanen Mottola is a 64 y.o. 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 Mucositis oral     T1N1M0 SCCa tongue base/lingual tonsil p16+, treated, now with hepatic mets on post-treatment scan.   Having a lot of odynophagia and mucositis. We did discuss interaction between pembro and steroids so will avoid systemic steroids.  Will do a short course of swish and spit dex mouthwash and he is agreeable to this given he is in so much pain. Can stop fluconazole . TFL reassuring and no fungal infection appreciated  He will call if he has worsening issues  Thank you for allowing me the opportunity to care for your patient. Please do not hesitate to contact me should you have any other questions.  Sincerely, Eldora Blanch, MD Otolaryngologist (ENT), [facility] Phone: [phone] Fax: [phone]  09/04/2024, 3:27 PM   I have personally spent 45 minutes involved in face-to-face and non-face-to-face activities for this patient on the day of the visit.  Professional time spent excludes any procedures performed but includes the following activities, in addition to those noted in the documentation: preparing to see the patient (review of outside documentation and results), performing a medically appropriate examination, counseling, documenting in the electronic health  record, independently interpreting results (PET), care coordination

## 2024-09-04 NOTE — Patient Instructions (Signed)
 Use the dexamethasone  mouthwash 5-8mL three times per day but do not swallow; only swish and spit

## 2024-09-04 NOTE — Telephone Encounter (Signed)
 Spoke with patient's sister, Douglas Hester, who had some questions and had left a voicemail. Douglas Hester asked about home health. We spoke about PT and Speech Therapy and Douglas Hester said she would discuss further with patient to see if this is what he would like. In addition, Douglas Hester wanted to know when Douglas Hester was coming to obtain blood on Douglas Hester in his home. I let Douglas Hester know that I would reach out to [employer] to find out and that either [employer] or myself would reach out to her to plan. Douglas Hester seemed satisfied with this plan.

## 2024-09-11 ENCOUNTER — Inpatient Hospital Stay: Attending: Radiation Oncology

## 2024-09-11 ENCOUNTER — Inpatient Hospital Stay: Admitting: Dietician

## 2024-09-11 ENCOUNTER — Inpatient Hospital Stay

## 2024-09-11 ENCOUNTER — Inpatient Hospital Stay: Admitting: Oncology

## 2024-09-15 ENCOUNTER — Inpatient Hospital Stay

## 2024-09-23 ENCOUNTER — Ambulatory Visit: Admitting: Radiation Oncology

## 2024-09-23 ENCOUNTER — Ambulatory Visit: Admitting: Radiology

## 2024-10-02 ENCOUNTER — Inpatient Hospital Stay

## 2024-10-02 ENCOUNTER — Inpatient Hospital Stay: Admitting: Oncology

## 2024-10-02 ENCOUNTER — Inpatient Hospital Stay: Attending: Radiation Oncology

## 2024-10-06 ENCOUNTER — Inpatient Hospital Stay
